# Patient Record
Sex: Male | Born: 1950 | Race: White | Hispanic: No | Marital: Married | Smoking: Never smoker
Health system: Southern US, Community
[De-identification: ages and names within clinical notes are randomized; demographics above are authoritative.]

## PROBLEM LIST (undated history)

## (undated) ENCOUNTER — Emergency Department (HOSPITAL_BASED_OUTPATIENT_CLINIC_OR_DEPARTMENT_OTHER): Payer: No Typology Code available for payment source | Source: Home / Self Care

## (undated) DIAGNOSIS — I10 Essential (primary) hypertension: Secondary | ICD-10-CM

## (undated) DIAGNOSIS — E119 Type 2 diabetes mellitus without complications: Secondary | ICD-10-CM

## (undated) DIAGNOSIS — E079 Disorder of thyroid, unspecified: Secondary | ICD-10-CM

## (undated) DIAGNOSIS — Z87828 Personal history of other (healed) physical injury and trauma: Secondary | ICD-10-CM

## (undated) DIAGNOSIS — M549 Dorsalgia, unspecified: Secondary | ICD-10-CM

## (undated) DIAGNOSIS — I2510 Atherosclerotic heart disease of native coronary artery without angina pectoris: Secondary | ICD-10-CM

## (undated) DIAGNOSIS — F32A Depression, unspecified: Secondary | ICD-10-CM

## (undated) DIAGNOSIS — Z992 Dependence on renal dialysis: Secondary | ICD-10-CM

## (undated) DIAGNOSIS — N289 Disorder of kidney and ureter, unspecified: Secondary | ICD-10-CM

## (undated) DIAGNOSIS — D649 Anemia, unspecified: Secondary | ICD-10-CM

## (undated) HISTORY — PX: ERCP: SHX60

---

## 2020-07-09 ENCOUNTER — Encounter (HOSPITAL_BASED_OUTPATIENT_CLINIC_OR_DEPARTMENT_OTHER): Payer: Self-pay

## 2020-07-09 ENCOUNTER — Emergency Department (HOSPITAL_BASED_OUTPATIENT_CLINIC_OR_DEPARTMENT_OTHER): Payer: Medicare Other

## 2020-07-09 ENCOUNTER — Emergency Department (HOSPITAL_BASED_OUTPATIENT_CLINIC_OR_DEPARTMENT_OTHER)
Admission: EM | Admit: 2020-07-09 | Discharge: 2020-07-09 | Disposition: A | Discharge status: Home / Self Care | Payer: Medicare Other | Attending: Emergency Medicine | Admitting: Emergency Medicine

## 2020-07-09 ENCOUNTER — Other Ambulatory Visit: Payer: Self-pay

## 2020-07-09 ENCOUNTER — Encounter: Payer: Self-pay | Admitting: Emergency Medicine

## 2020-07-09 DIAGNOSIS — Z992 Dependence on renal dialysis: Secondary | ICD-10-CM | POA: Insufficient documentation

## 2020-07-09 DIAGNOSIS — W01198A Fall on same level from slipping, tripping and stumbling with subsequent striking against other object, initial encounter: Secondary | ICD-10-CM | POA: Diagnosis not present

## 2020-07-09 DIAGNOSIS — M25551 Pain in right hip: Secondary | ICD-10-CM | POA: Insufficient documentation

## 2020-07-09 DIAGNOSIS — E119 Type 2 diabetes mellitus without complications: Secondary | ICD-10-CM | POA: Diagnosis not present

## 2020-07-09 DIAGNOSIS — I2510 Atherosclerotic heart disease of native coronary artery without angina pectoris: Secondary | ICD-10-CM | POA: Diagnosis not present

## 2020-07-09 DIAGNOSIS — M25561 Pain in right knee: Secondary | ICD-10-CM | POA: Insufficient documentation

## 2020-07-09 DIAGNOSIS — R0789 Other chest pain: Secondary | ICD-10-CM | POA: Diagnosis not present

## 2020-07-09 DIAGNOSIS — Y92002 Bathroom of unspecified non-institutional (private) residence single-family (private) house as the place of occurrence of the external cause: Secondary | ICD-10-CM | POA: Diagnosis not present

## 2020-07-09 DIAGNOSIS — S0012XA Contusion of left eyelid and periocular area, initial encounter: Secondary | ICD-10-CM | POA: Diagnosis not present

## 2020-07-09 DIAGNOSIS — M25562 Pain in left knee: Secondary | ICD-10-CM | POA: Diagnosis not present

## 2020-07-09 DIAGNOSIS — I10 Essential (primary) hypertension: Secondary | ICD-10-CM | POA: Diagnosis not present

## 2020-07-09 DIAGNOSIS — S0993XA Unspecified injury of face, initial encounter: Secondary | ICD-10-CM | POA: Diagnosis present

## 2020-07-09 DIAGNOSIS — W19XXXA Unspecified fall, initial encounter: Secondary | ICD-10-CM

## 2020-07-09 DIAGNOSIS — S0083XA Contusion of other part of head, initial encounter: Secondary | ICD-10-CM | POA: Diagnosis not present

## 2020-07-09 DIAGNOSIS — M549 Dorsalgia, unspecified: Secondary | ICD-10-CM | POA: Insufficient documentation

## 2020-07-09 HISTORY — DX: Disorder of kidney and ureter, unspecified: N28.9

## 2020-07-09 HISTORY — DX: Dorsalgia, unspecified: M54.9

## 2020-07-09 HISTORY — DX: Atherosclerotic heart disease of native coronary artery without angina pectoris: I25.10

## 2020-07-09 HISTORY — DX: Type 2 diabetes mellitus without complications: E11.9

## 2020-07-09 HISTORY — DX: Depression, unspecified: F32.A

## 2020-07-09 HISTORY — DX: Dependence on renal dialysis: Z99.2

## 2020-07-09 HISTORY — DX: Disorder of thyroid, unspecified: E07.9

## 2020-07-09 HISTORY — DX: Essential (primary) hypertension: I10

## 2020-07-09 NOTE — ED Triage Notes (Signed)
"  Was pulling up pants and fell in bathroom stall and hit my head on the door" per pt Denies loc

## 2020-07-09 NOTE — ED Notes (Signed)
Patient transported to X-ray 

## 2020-07-09 NOTE — ED Provider Notes (Signed)
[name]   CSN: [id number] Arrival date & time: 07/09/20  1528     History Chief Complaint  Patient presents with   Chase [name]    Chase [name] is a 70 y.o. male.  Mechanical fall at home.  Does peritoneal dialysis at home.  Not on blood thinners.  Hit the left side of his forehead on the door.  Landed on his front side.  Has pain to both knees, right hip, anterior chest wall.Was ambulatory after getting up with EMS.   Fall This is a new problem. The current episode started less than 1 hour ago. The problem has been resolved. Associated symptoms include chest pain. Pertinent negatives include no abdominal pain, no headaches and no shortness of breath. Nothing aggravates the symptoms. Nothing relieves the symptoms. He has tried nothing for the symptoms. The treatment provided no relief.      Past Medical History:  Diagnosis Date   Back pain with radiation    Coronary artery disease    Depression    Diabetes mellitus without complication ([place])    Dialysis patient [facility])    Hypertension    Renal disorder    Thyroid disease     Patient Active Problem List   Diagnosis Date Noted   Dialysis patient [facility]) 07/09/2020   Back pain 07/09/2020      History reviewed. No pertinent family history.  Social History   Tobacco Use   Smoking status: Never   Smokeless tobacco: Never  Substance Use Topics   Alcohol use: Never   Drug use: Never    Home Medications Prior to Admission medications   Not on File    Allergies    Gabapentin and Lipitor [atorvastatin]  Review of Systems   Review of Systems  Constitutional:  Negative for chills and fever.  HENT:  Negative for ear pain and sore throat.   Eyes:  Negative for pain and visual disturbance.  Respiratory:  Negative for cough and shortness of breath.   Cardiovascular:  Positive for chest pain. Negative for palpitations.  Gastrointestinal:  Negative for abdominal pain and  vomiting.  Genitourinary:  Negative for dysuria and hematuria.  Musculoskeletal:  Positive for arthralgias. Negative for back pain.  Skin:  Positive for wound. Negative for color change and rash.  Neurological:  Negative for seizures, syncope, weakness, numbness and headaches.  All other systems reviewed and are negative.  Physical Exam Updated Vital Signs BP (!) 148/66 (BP Location: Right Arm)   Pulse 78   Temp 98 F (36.7 C) (Oral)   Resp 20   Ht '5\' 6"'$  (1.676 m)   Wt 99.8 kg   SpO2 98%   BMI 35.51 kg/m   Physical Exam Vitals and nursing note reviewed.  Constitutional:      Appearance: He is well-developed.  HENT:     Head:     Comments: Hematoma to left side of the forehead/eyebrow    Mouth/Throat:     Mouth: Mucous membranes are moist.  Eyes:     Extraocular Movements: Extraocular movements intact.     Conjunctiva/sclera: Conjunctivae normal.     Pupils: Pupils are equal, round, and reactive to light.  Cardiovascular:     Rate and Rhythm: Normal rate and regular rhythm.     Heart sounds: No murmur heard. Pulmonary:     Effort: Pulmonary effort is normal. No respiratory distress.     Breath sounds: Normal breath sounds.  Abdominal:     Palpations:  Abdomen is soft.     Tenderness: There is no abdominal tenderness.  Musculoskeletal:        General: Tenderness present.     Cervical back: Normal range of motion and neck supple.     Comments: Tenderness to anterior chest wall, right hip, bilateral knees, no midline spinal tenderness  Skin:    General: Skin is warm and dry.     Capillary Refill: Capillary refill takes less than 2 seconds.  Neurological:     General: No focal deficit present.     Mental Status: He is alert and oriented to person, place, and time.     Cranial Nerves: No cranial nerve deficit.     Sensory: No sensory deficit.     Motor: No weakness.     Coordination: Coordination normal.  Psychiatric:        Mood and Affect: Mood normal.    ED  Results / Procedures / Treatments   Labs (all labs ordered are listed, but only abnormal results are displayed) Labs Reviewed - No data to display  EKG None  Radiology DG Chest 2 View  Result Date: 07/09/2020 CLINICAL DATA:  Anterior left-sided chest pain. Fall in the bathroom. EXAM: CHEST - 2 VIEW COMPARISON:  Chest radiograph and CT 06/04/2019 FINDINGS: The cardiomediastinal contours are normal. Minor linear scarring in the right lung. Pulmonary vasculature is normal. No consolidation, pleural effusion, or pneumothorax. Spinal stimulator in place with 2 separate lead tips. No acute osseous abnormalities are seen. Chronic mild L1 superior endplate compression fracture is stable. IMPRESSION: No evidence of acute injury to the thorax. Mild right lung scarring. Electronically Signed   By: Keith Rake M.D.   On: 07/09/2020 16:23   CT Head Wo Contrast  Result Date: 07/09/2020 CLINICAL DATA:  Pain, status post fall EXAM: CT HEAD WITHOUT CONTRAST CT MAXILLOFACIAL WITHOUT CONTRAST CT CERVICAL SPINE WITHOUT CONTRAST TECHNIQUE: Multidetector CT imaging of the head, cervical spine, and maxillofacial structures were performed using the standard protocol without intravenous contrast. Multiplanar CT image reconstructions of the cervical spine and maxillofacial structures were also generated. COMPARISON:  None. FINDINGS: CT HEAD FINDINGS Brain: Cerebral ventricle sizes are concordant with the degree of cerebral volume loss. Patchy and confluent areas of decreased attenuation are noted throughout the deep and periventricular white matter of the cerebral hemispheres bilaterally, compatible with chronic microvascular ischemic disease. No evidence of large-territorial acute infarction. No parenchymal hemorrhage. No mass lesion. No extra-axial collection. No mass effect or midline shift. No hydrocephalus. Basilar cisterns are patent. Vascular: No hyperdense vessel. Atherosclerotic calcifications are present within  the cavernous internal carotid arteries. Skull: No acute fracture or focal lesion. Other: Left frontal scalp/periorbital subcutaneus soft tissue hematoma formation. CT MAXILLOFACIAL FINDINGS Osseous: No fracture or mandibular dislocation. No destructive process. Sinuses/Orbits: Paranasal sinuses and mastoid air cells are clear. Bilateral lens replacements. Otherwise the orbits are unremarkable. Soft tissues: Negative. CT CERVICAL SPINE FINDINGS Alignment: Normal. Skull base and vertebrae: Severe degenerative changes of the spine. No acute fracture. No aggressive appearing focal osseous lesion or focal pathologic process. Soft tissues and spinal canal: No prevertebral fluid or swelling. No visible canal hematoma. Upper chest: Unremarkable. Other: None. IMPRESSION: 1. No acute intracranial abnormality. 2. No acute displaced facial fracture. 3. No acute displaced fracture or traumatic listhesis of the cervical spine. 4. Left periorbital/frontal scalp hematoma formation with no underlying fracture. Electronically Signed   By: Iven Finn M.D.   On: 07/09/2020 16:06   CT Cervical Spine Wo Contrast  Result Date: 07/09/2020 CLINICAL DATA:  Pain, status post fall EXAM: CT HEAD WITHOUT CONTRAST CT MAXILLOFACIAL WITHOUT CONTRAST CT CERVICAL SPINE WITHOUT CONTRAST TECHNIQUE: Multidetector CT imaging of the head, cervical spine, and maxillofacial structures were performed using the standard protocol without intravenous contrast. Multiplanar CT image reconstructions of the cervical spine and maxillofacial structures were also generated. COMPARISON:  None. FINDINGS: CT HEAD FINDINGS Brain: Cerebral ventricle sizes are concordant with the degree of cerebral volume loss. Patchy and confluent areas of decreased attenuation are noted throughout the deep and periventricular white matter of the cerebral hemispheres bilaterally, compatible with chronic microvascular ischemic disease. No evidence of large-territorial acute  infarction. No parenchymal hemorrhage. No mass lesion. No extra-axial collection. No mass effect or midline shift. No hydrocephalus. Basilar cisterns are patent. Vascular: No hyperdense vessel. Atherosclerotic calcifications are present within the cavernous internal carotid arteries. Skull: No acute fracture or focal lesion. Other: Left frontal scalp/periorbital subcutaneus soft tissue hematoma formation. CT MAXILLOFACIAL FINDINGS Osseous: No fracture or mandibular dislocation. No destructive process. Sinuses/Orbits: Paranasal sinuses and mastoid air cells are clear. Bilateral lens replacements. Otherwise the orbits are unremarkable. Soft tissues: Negative. CT CERVICAL SPINE FINDINGS Alignment: Normal. Skull base and vertebrae: Severe degenerative changes of the spine. No acute fracture. No aggressive appearing focal osseous lesion or focal pathologic process. Soft tissues and spinal canal: No prevertebral fluid or swelling. No visible canal hematoma. Upper chest: Unremarkable. Other: None. IMPRESSION: 1. No acute intracranial abnormality. 2. No acute displaced facial fracture. 3. No acute displaced fracture or traumatic listhesis of the cervical spine. 4. Left periorbital/frontal scalp hematoma formation with no underlying fracture. Electronically Signed   By: Iven Finn M.D.   On: 07/09/2020 16:06   DG Knee Complete 4 Views Left  Result Date: 07/09/2020 CLINICAL DATA:  Bilateral knee pain after fall today. EXAM: LEFT KNEE - COMPLETE 4+ VIEW COMPARISON:  None. FINDINGS: No evidence of fracture, dislocation, or joint effusion. Mild tricompartmental degenerative change with peripheral spurring. Mild soft tissue edema. Vascular calcifications are seen. IMPRESSION: 1. No acute fracture or subluxation of the left knee. 2. Mild tricompartmental osteoarthritis. Electronically Signed   By: Keith Rake M.D.   On: 07/09/2020 16:28   DG Knee Complete 4 Views Right  Result Date: 07/09/2020 CLINICAL DATA:   Bilateral knee pain after fall today. EXAM: RIGHT KNEE - COMPLETE 4+ VIEW COMPARISON:  None. FINDINGS: No evidence of fracture, dislocation, or joint effusion. Mild tricompartmental peripheral spurring. Probable 31m ossified intra-articular body in the medial joint space. Mild generalized soft tissue edema. IMPRESSION: 1. No acute fracture or subluxation of the right knee. 2. Mild tricompartmental osteoarthritis with probable ossified intra-articular body in the medial joint space. Electronically Signed   By: MKeith RakeM.D.   On: 07/09/2020 16:26   DG Hip Unilat With Pelvis 2-3 Views Right  Result Date: 07/09/2020 CLINICAL DATA:  Right hip pain after fall. EXAM: DG HIP (WITH OR WITHOUT PELVIS) 2-3V RIGHT COMPARISON:  Reformats from abdominopelvic CT 02/09/2020 FINDINGS: No acute fracture. Moderate right hip osteoarthritis with joint space narrowing, acetabular and femoral head spurring. Intact pubic rami. The sacroiliac joints and pubic symphysis are congruent. IMPRESSION: 1. No acute fracture of the pelvis or right hip. 2. Moderate right hip osteoarthritis. Electronically Signed   By: MKeith RakeM.D.   On: 07/09/2020 16:25   CT Maxillofacial Wo Contrast  Result Date: 07/09/2020 CLINICAL DATA:  Pain, status post fall EXAM: CT HEAD WITHOUT CONTRAST CT MAXILLOFACIAL WITHOUT CONTRAST CT CERVICAL SPINE  WITHOUT CONTRAST TECHNIQUE: Multidetector CT imaging of the head, cervical spine, and maxillofacial structures were performed using the standard protocol without intravenous contrast. Multiplanar CT image reconstructions of the cervical spine and maxillofacial structures were also generated. COMPARISON:  None. FINDINGS: CT HEAD FINDINGS Brain: Cerebral ventricle sizes are concordant with the degree of cerebral volume loss. Patchy and confluent areas of decreased attenuation are noted throughout the deep and periventricular white matter of the cerebral hemispheres bilaterally, compatible with chronic  microvascular ischemic disease. No evidence of large-territorial acute infarction. No parenchymal hemorrhage. No mass lesion. No extra-axial collection. No mass effect or midline shift. No hydrocephalus. Basilar cisterns are patent. Vascular: No hyperdense vessel. Atherosclerotic calcifications are present within the cavernous internal carotid arteries. Skull: No acute fracture or focal lesion. Other: Left frontal scalp/periorbital subcutaneus soft tissue hematoma formation. CT MAXILLOFACIAL FINDINGS Osseous: No fracture or mandibular dislocation. No destructive process. Sinuses/Orbits: Paranasal sinuses and mastoid air cells are clear. Bilateral lens replacements. Otherwise the orbits are unremarkable. Soft tissues: Negative. CT CERVICAL SPINE FINDINGS Alignment: Normal. Skull base and vertebrae: Severe degenerative changes of the spine. No acute fracture. No aggressive appearing focal osseous lesion or focal pathologic process. Soft tissues and spinal canal: No prevertebral fluid or swelling. No visible canal hematoma. Upper chest: Unremarkable. Other: None. IMPRESSION: 1. No acute intracranial abnormality. 2. No acute displaced facial fracture. 3. No acute displaced fracture or traumatic listhesis of the cervical spine. 4. Left periorbital/frontal scalp hematoma formation with no underlying fracture. Electronically Signed   By: Iven Finn M.D.   On: 07/09/2020 16:06    Procedures Procedures   Medications Ordered in ED Medications - No data to display  ED Course  I have reviewed the triage vital signs and the nursing notes.  Pertinent labs & imaging results that were available during my care of the patient were reviewed by me and considered in my medical decision making (see chart for details).    MDM Rules/Calculators/A&P                          Chase [name] is here with renal disease on home dialysis who presents the ED after mechanical fall at home.  Normal vitals.  No fever.  Patient  tripped and fell while pulling up his pants after going to the bathroom.  Hit the door and fell on the floor facing forward.  Not lose consciousness.  Not on blood thinners.  Has hematoma over the left side of his forehead, eyebrow.  Extraocular movements are intact.  Having some pain to his right hip, anterior chest wall, bilateral knees.  Uses a cane at times at home.  We will get CT of head, neck, face, x-rays of his hip, chest, bilateral knees.  CT images overall unremarkable except for hematoma over the left side of the forehead.  No fractures, no head bleed.  Extremity x-rays overall unremarkable.  No rib fractures.  No hip fracture.  Overall patient with contusions.  Discharged in good condition.  This chart was dictated using voice recognition software.  Despite best efforts to proofread,  errors can occur which can change the documentation meaning.   Final Clinical Impression(s) / ED Diagnoses Final diagnoses:  Fall, initial encounter  Contusion of left eyelid, initial encounter    Rx / DC Orders ED Discharge Orders     None        Lennice Sites, DO 07/09/20 1632

## 2020-07-09 NOTE — Discharge Instructions (Addendum)
X-ray showed no fractures.  CT scans overall unremarkable.  Recommend ice to your forehead.  Follow-up with primary care doctor if pain is persistent.  Overall suspect you have bone bruises.

## 2020-07-09 NOTE — ED Notes (Signed)
Patient transported to CT 

## 2020-10-11 ENCOUNTER — Emergency Department (HOSPITAL_BASED_OUTPATIENT_CLINIC_OR_DEPARTMENT_OTHER): Payer: Medicare Other

## 2020-10-11 ENCOUNTER — Encounter (HOSPITAL_BASED_OUTPATIENT_CLINIC_OR_DEPARTMENT_OTHER): Payer: Self-pay | Admitting: Emergency Medicine

## 2020-10-11 ENCOUNTER — Other Ambulatory Visit: Payer: Self-pay

## 2020-10-11 ENCOUNTER — Emergency Department (HOSPITAL_BASED_OUTPATIENT_CLINIC_OR_DEPARTMENT_OTHER)
Admission: EM | Admit: 2020-10-11 | Discharge: 2020-10-11 | Disposition: A | Discharge status: Home / Self Care | Payer: Medicare Other | Attending: Emergency Medicine | Admitting: Emergency Medicine

## 2020-10-11 DIAGNOSIS — W19XXXA Unspecified fall, initial encounter: Secondary | ICD-10-CM

## 2020-10-11 DIAGNOSIS — N186 End stage renal disease: Secondary | ICD-10-CM | POA: Insufficient documentation

## 2020-10-11 DIAGNOSIS — Z20822 Contact with and (suspected) exposure to covid-19: Secondary | ICD-10-CM | POA: Diagnosis not present

## 2020-10-11 DIAGNOSIS — I2510 Atherosclerotic heart disease of native coronary artery without angina pectoris: Secondary | ICD-10-CM | POA: Diagnosis not present

## 2020-10-11 DIAGNOSIS — S80211A Abrasion, right knee, initial encounter: Secondary | ICD-10-CM | POA: Insufficient documentation

## 2020-10-11 DIAGNOSIS — Z992 Dependence on renal dialysis: Secondary | ICD-10-CM | POA: Insufficient documentation

## 2020-10-11 DIAGNOSIS — S0990XA Unspecified injury of head, initial encounter: Secondary | ICD-10-CM | POA: Diagnosis present

## 2020-10-11 DIAGNOSIS — I120 Hypertensive chronic kidney disease with stage 5 chronic kidney disease or end stage renal disease: Secondary | ICD-10-CM | POA: Diagnosis not present

## 2020-10-11 DIAGNOSIS — S0031XA Abrasion of nose, initial encounter: Secondary | ICD-10-CM | POA: Diagnosis not present

## 2020-10-11 DIAGNOSIS — W1830XA Fall on same level, unspecified, initial encounter: Secondary | ICD-10-CM | POA: Diagnosis not present

## 2020-10-11 DIAGNOSIS — M542 Cervicalgia: Secondary | ICD-10-CM | POA: Diagnosis not present

## 2020-10-11 DIAGNOSIS — E1122 Type 2 diabetes mellitus with diabetic chronic kidney disease: Secondary | ICD-10-CM | POA: Insufficient documentation

## 2020-10-11 DIAGNOSIS — S00511A Abrasion of lip, initial encounter: Secondary | ICD-10-CM | POA: Insufficient documentation

## 2020-10-11 DIAGNOSIS — S0083XA Contusion of other part of head, initial encounter: Secondary | ICD-10-CM | POA: Diagnosis not present

## 2020-10-11 LAB — COMPREHENSIVE METABOLIC PANEL
ALT: 29 U/L (ref 0–44)
AST: 47 U/L — ABNORMAL HIGH (ref 15–41)
Albumin: 2.6 g/dL — ABNORMAL LOW (ref 3.5–5.0)
Alkaline Phosphatase: 91 U/L (ref 38–126)
Anion gap: 9 (ref 5–15)
BUN: 71 mg/dL — ABNORMAL HIGH (ref 8–23)
CO2: 26 mmol/L (ref 22–32)
Calcium: 8.1 mg/dL — ABNORMAL LOW (ref 8.9–10.3)
Chloride: 103 mmol/L (ref 98–111)
Creatinine, Ser: 8.19 mg/dL — ABNORMAL HIGH (ref 0.61–1.24)
GFR, Estimated: 7 mL/min — ABNORMAL LOW (ref >60)
Glucose, Bld: 115 mg/dL — ABNORMAL HIGH (ref 70–99)
Potassium: 4.1 mmol/L (ref 3.5–5.1)
Sodium: 138 mmol/L (ref 135–145)
Total Bilirubin: 0.4 mg/dL (ref 0.3–1.2)
Total Protein: 6.2 g/dL — ABNORMAL LOW (ref 6.5–8.1)

## 2020-10-11 LAB — CBC WITH DIFFERENTIAL/PLATELET
Abs Immature Granulocytes: 0.05 10*3/uL (ref 0.00–0.07)
Basophils Absolute: 0.1 10*3/uL (ref 0.0–0.1)
Basophils Relative: 1 %
Eosinophils Absolute: 0.4 10*3/uL (ref 0.0–0.5)
Eosinophils Relative: 4 %
HCT: 28.3 % — ABNORMAL LOW (ref 39.0–52.0)
Hemoglobin: 9.2 g/dL — ABNORMAL LOW (ref 13.0–17.0)
Immature Granulocytes: 1 %
Lymphocytes Relative: 10 %
Lymphs Abs: 0.9 10*3/uL (ref 0.7–4.0)
MCH: 33.5 pg (ref 26.0–34.0)
MCHC: 32.5 g/dL (ref 30.0–36.0)
MCV: 102.9 fL — ABNORMAL HIGH (ref 80.0–100.0)
Monocytes Absolute: 0.6 10*3/uL (ref 0.1–1.0)
Monocytes Relative: 6 %
Neutro Abs: 7.6 10*3/uL (ref 1.7–7.7)
Neutrophils Relative %: 78 %
Platelets: 112 10*3/uL — ABNORMAL LOW (ref 150–400)
RBC: 2.75 MIL/uL — ABNORMAL LOW (ref 4.22–5.81)
RDW: 15 % (ref 11.5–15.5)
Smear Review: ADEQUATE
WBC: 9.6 10*3/uL (ref 4.0–10.5)
nRBC: 0 % (ref 0.0–0.2)

## 2020-10-11 LAB — PROTIME-INR
INR: 1 (ref 0.8–1.2)
Prothrombin Time: 13.1 seconds (ref 11.4–15.2)

## 2020-10-11 LAB — RESP PANEL BY RT-PCR (FLU A&B, COVID) ARPGX2
Influenza A by PCR: NEGATIVE
Influenza B by PCR: NEGATIVE
SARS Coronavirus 2 by RT PCR: NEGATIVE

## 2020-10-11 MED ORDER — ACETAMINOPHEN 325 MG PO TABS
650.0000 mg | ORAL_TABLET | Freq: Once | ORAL | Status: AC
  Administered 2020-10-11: 650 mg via ORAL
  Filled 2020-10-11: qty 2

## 2020-10-11 MED ORDER — OXYCODONE HCL 5 MG PO TABS
5.0000 mg | ORAL_TABLET | Freq: Once | ORAL | Status: AC
  Administered 2020-10-11: 5 mg via ORAL
  Filled 2020-10-11: qty 1

## 2020-10-11 MED ORDER — FENTANYL CITRATE PF 50 MCG/ML IJ SOSY
50.0000 ug | PREFILLED_SYRINGE | Freq: Once | INTRAMUSCULAR | Status: AC
Start: 2020-10-11 — End: 2020-10-11
  Administered 2020-10-11: 50 ug via INTRAVENOUS
  Filled 2020-10-11: qty 1

## 2020-10-11 NOTE — ED Notes (Signed)
Patient with ground level fall on concrete brought in by EMS.  Patient with swelling noted to forehead, abrasion to forehead, nose and mouth, swelling to lip.  C/o pain to face, head, neck, mouth, and right knee.  Patient in cervical collar from EMS. Patient reports falling due to ongoing problem with weakness he is going to physical therapy.
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
## 2020-10-11 NOTE — ED Notes (Signed)
Patient transported to CT 

## 2020-10-11 NOTE — ED Triage Notes (Signed)
T BIB [facility] with report of mechanical fall. Hematoma noted to forehead. Small lac noted to bridge of note. Pt c/o neck pain. Endorses PT for weakness. Pt reports neck pain, brought in with c-collar placed by EMS. Pt denies HA, denies dizziness, denies thinners. EDP at bedside for MSE

## 2020-10-24 ENCOUNTER — Encounter (HOSPITAL_BASED_OUTPATIENT_CLINIC_OR_DEPARTMENT_OTHER): Payer: Self-pay

## 2020-10-24 ENCOUNTER — Other Ambulatory Visit: Payer: Self-pay

## 2020-10-24 ENCOUNTER — Emergency Department (HOSPITAL_BASED_OUTPATIENT_CLINIC_OR_DEPARTMENT_OTHER): Payer: Medicare Other

## 2020-10-24 ENCOUNTER — Observation Stay (HOSPITAL_BASED_OUTPATIENT_CLINIC_OR_DEPARTMENT_OTHER)
Admission: EM | Admit: 2020-10-24 | Discharge: 2020-10-26 | Disposition: A | Discharge status: Home / Self Care | Payer: Medicare Other | Attending: Family Medicine | Admitting: Family Medicine

## 2020-10-24 DIAGNOSIS — Y92009 Unspecified place in unspecified non-institutional (private) residence as the place of occurrence of the external cause: Secondary | ICD-10-CM | POA: Diagnosis not present

## 2020-10-24 DIAGNOSIS — I120 Hypertensive chronic kidney disease with stage 5 chronic kidney disease or end stage renal disease: Secondary | ICD-10-CM | POA: Insufficient documentation

## 2020-10-24 DIAGNOSIS — R531 Weakness: Secondary | ICD-10-CM

## 2020-10-24 DIAGNOSIS — Z9181 History of falling: Secondary | ICD-10-CM | POA: Insufficient documentation

## 2020-10-24 DIAGNOSIS — S065XAA Traumatic subdural hemorrhage with loss of consciousness status unknown, initial encounter: Secondary | ICD-10-CM | POA: Diagnosis present

## 2020-10-24 DIAGNOSIS — S065X0A Traumatic subdural hemorrhage without loss of consciousness, initial encounter: Principal | ICD-10-CM | POA: Insufficient documentation

## 2020-10-24 DIAGNOSIS — I2510 Atherosclerotic heart disease of native coronary artery without angina pectoris: Secondary | ICD-10-CM | POA: Diagnosis not present

## 2020-10-24 DIAGNOSIS — Z20822 Contact with and (suspected) exposure to covid-19: Secondary | ICD-10-CM | POA: Diagnosis not present

## 2020-10-24 DIAGNOSIS — G8929 Other chronic pain: Secondary | ICD-10-CM | POA: Diagnosis present

## 2020-10-24 DIAGNOSIS — W19XXXA Unspecified fall, initial encounter: Secondary | ICD-10-CM

## 2020-10-24 DIAGNOSIS — D72829 Elevated white blood cell count, unspecified: Secondary | ICD-10-CM | POA: Diagnosis present

## 2020-10-24 DIAGNOSIS — Z992 Dependence on renal dialysis: Secondary | ICD-10-CM | POA: Diagnosis not present

## 2020-10-24 DIAGNOSIS — E119 Type 2 diabetes mellitus without complications: Secondary | ICD-10-CM

## 2020-10-24 DIAGNOSIS — N186 End stage renal disease: Secondary | ICD-10-CM

## 2020-10-24 DIAGNOSIS — E1122 Type 2 diabetes mellitus with diabetic chronic kidney disease: Secondary | ICD-10-CM | POA: Insufficient documentation

## 2020-10-24 DIAGNOSIS — W01198A Fall on same level from slipping, tripping and stumbling with subsequent striking against other object, initial encounter: Secondary | ICD-10-CM | POA: Diagnosis not present

## 2020-10-24 DIAGNOSIS — R8290 Unspecified abnormal findings in urine: Secondary | ICD-10-CM | POA: Diagnosis present

## 2020-10-24 DIAGNOSIS — Z794 Long term (current) use of insulin: Secondary | ICD-10-CM

## 2020-10-24 DIAGNOSIS — I609 Nontraumatic subarachnoid hemorrhage, unspecified: Secondary | ICD-10-CM

## 2020-10-24 MED ORDER — SODIUM CHLORIDE 0.9 % IV BOLUS
500.0000 mL | Freq: Once | INTRAVENOUS | Status: AC
  Administered 2020-10-25: 500 mL via INTRAVENOUS

## 2020-10-24 MED ORDER — ACETAMINOPHEN 325 MG PO TABS
650.0000 mg | ORAL_TABLET | Freq: Once | ORAL | Status: AC
  Administered 2020-10-24: 650 mg via ORAL
  Filled 2020-10-24: qty 2

## 2020-10-24 NOTE — ED Provider Notes (Signed)
Emergency Department Provider Note   I have reviewed the triage vital signs and the nursing notes.   HISTORY  Chief Complaint Fall   HPI Chase [name] is a 70 y.o. male with past history reviewed below including diabetes, hypertension, dialysis presents to the emergency department with headache after fall.  He has had subacute balance issues and multiple falls including fall today.  He feels some generalized weakness but denies any unilateral weakness or numbness.  He again lost his balance today falling and striking his head.  Denies pain in the arms or legs.  Presents to the emergency department for evaluation. No radiation of symptoms or modifying factors. HA is diffuse and mild to moderate in severity.    Past Medical History:  Diagnosis Date   Back pain with radiation    Coronary artery disease    Depression    Diabetes mellitus without complication ([place])    Dialysis patient [facility])    Hypertension    Renal disorder    Thyroid disease     Patient Active Problem List   Diagnosis Date Noted   Acute subdural hematoma 10/25/2020   ESRD on peritoneal dialysis ([place]) 10/25/2020   Leukocytosis 10/25/2020   Fall at home, initial encounter 10/25/2020   Abnormal urinalysis 10/25/2020   Chronic pain 10/25/2020   Diabetes mellitus ([place]) 10/25/2020   Dialysis patient ([place]) 07/09/2020   Back pain 07/09/2020    History reviewed. No pertinent surgical history.  Allergies Gabapentin, Lipitor [atorvastatin], and Pregabalin  History reviewed. No pertinent family history.  Social History Social History   Tobacco Use   Smoking status: Never   Smokeless tobacco: Never  Substance Use Topics   Alcohol use: Never   Drug use: Never    Review of Systems  Constitutional: No fever/chills Eyes: No visual changes. ENT: No sore throat. Cardiovascular: Denies chest pain. Respiratory: Denies shortness of breath. Gastrointestinal: No abdominal pain.  No nausea, no vomiting.  No  diarrhea.  No constipation. Genitourinary: Negative for dysuria. Musculoskeletal: Negative for back pain. Skin: Negative for rash. Neurological: Negative for focal weakness or numbness. Positive HA.   10-point ROS otherwise negative.  ____________________________________________   PHYSICAL EXAM:  VITAL SIGNS: ED Triage Vitals  Enc Vitals Group     BP 10/24/20 2022 (!) 146/65     Pulse Rate 10/24/20 2022 68     Resp 10/24/20 2022 20     Temp 10/24/20 2022 98.5 F (36.9 C)     Temp Source 10/24/20 2022 Oral     SpO2 10/24/20 2017 98 %     Weight 10/24/20 2021 230 lb (104.3 kg)     Height 10/24/20 2021 '5\' 6"'$  (1.676 m)   Constitutional: Alert and oriented. Well appearing and in no acute distress. Eyes: Conjunctivae are normal. PERRL. EOMI. Head: Frontal scalp hematoma with ecchymosis. No laceration.  Nose: No congestion/rhinnorhea. Mouth/Throat: Mucous membranes are moist.  Neck: No stridor. No cervical spine tenderness to palpation. Cardiovascular: Normal rate, regular rhythm. Good peripheral circulation. Grossly normal heart sounds.   Respiratory: Normal respiratory effort.  No retractions. Lungs CTAB. Gastrointestinal: Soft and nontender. No distention.  Musculoskeletal: No lower extremity tenderness nor edema. No gross deformities of extremities. Neurologic:  Normal speech and language. No gross focal neurologic deficits are appreciated.  Skin:  Skin is warm, dry and intact. No rash noted.   ____________________________________________   LABS (all labs ordered are listed, but only abnormal results are displayed)  Labs Reviewed  COMPREHENSIVE METABOLIC PANEL -  Abnormal; Notable for the following components:      Result Value   Glucose, Bld 132 (*)    BUN 65 (*)    Creatinine, Ser 9.37 (*)    Calcium 7.4 (*)    Total Protein 5.8 (*)    Albumin 2.4 (*)    GFR, Estimated 6 (*)    All other components within normal limits  CBC WITH DIFFERENTIAL/PLATELET - Abnormal;  Notable for the following components:   WBC 11.4 (*)    RBC 2.86 (*)    Hemoglobin 9.5 (*)    HCT 29.7 (*)    MCV 103.8 (*)    RDW 16.2 (*)    Platelets 117 (*)    Neutro Abs 9.2 (*)    All other components within normal limits  URINALYSIS, ROUTINE W REFLEX MICROSCOPIC - Abnormal; Notable for the following components:   APPearance HAZY (*)    Hgb urine dipstick MODERATE (*)    Protein, ur 100 (*)    Leukocytes,Ua SMALL (*)    All other components within normal limits  URINALYSIS, MICROSCOPIC (REFLEX) - Abnormal; Notable for the following components:   Bacteria, UA RARE (*)    All other components within normal limits  TSH - Abnormal; Notable for the following components:   TSH 4.504 (*)    All other components within normal limits  GLUCOSE, CAPILLARY - Abnormal; Notable for the following components:   Glucose-Capillary 132 (*)    All other components within normal limits  CBC - Abnormal; Notable for the following components:   RBC 2.72 (*)    Hemoglobin 8.8 (*)    HCT 29.1 (*)    MCV 107.0 (*)    RDW 16.2 (*)    Platelets 115 (*)    All other components within normal limits  RENAL FUNCTION PANEL - Abnormal; Notable for the following components:   Glucose, Bld 182 (*)    BUN 65 (*)    Creatinine, Ser 9.56 (*)    Calcium 7.6 (*)    Phosphorus 5.3 (*)    Albumin 2.1 (*)    GFR, Estimated 5 (*)    All other components within normal limits  GLUCOSE, CAPILLARY - Abnormal; Notable for the following components:   Glucose-Capillary 135 (*)    All other components within normal limits  GLUCOSE, CAPILLARY - Abnormal; Notable for the following components:   Glucose-Capillary 201 (*)    All other components within normal limits  GLUCOSE, CAPILLARY - Abnormal; Notable for the following components:   Glucose-Capillary 110 (*)    All other components within normal limits  GLUCOSE, CAPILLARY - Abnormal; Notable for the following components:   Glucose-Capillary 116 (*)    All other  components within normal limits  RESP PANEL BY RT-PCR (FLU A&B, COVID) ARPGX2  URINE CULTURE  LIPASE, BLOOD  PROTIME-INR  HEMOGLOBIN A1C  FOLATE   ____________________________________________  RADIOLOGY  CT Head Wo Contrast  Addendum Date: 10/24/2020   ADDENDUM REPORT: 10/24/2020 21:52 ADDENDUM: These results were called by telephone at the time of interpretation on 10/24/2020 at 9:38 pm to provider Fredia Sorrow , who verbally acknowledged these results. Electronically Signed   By: Ronney Asters M.D.   On: 10/24/2020 21:52   Result Date: 10/24/2020 CLINICAL DATA:  Trauma. EXAM: CT HEAD WITHOUT CONTRAST TECHNIQUE: Contiguous axial images were obtained from the base of the skull through the vertex without intravenous contrast. COMPARISON:  CT head 10/11/2020. FINDINGS: Brain: Acute subdural hemorrhage is seen overlying the  falx bilaterally measuring up to 4 mm in thickness. There is also small amount of acute hemorrhage overlying the right tentorium measuring up 2 mm. There is a small amount of subarachnoid hemorrhage in the high right frontal region coronal image 4/54. There is no significant mass effect or midline shift. There is mild diffuse atrophy. There is no hydrocephalus. There is mild patchy periventricular and deep white matter hypodensity, likely chronic small vessel ischemic change. 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 Vascular: Atherosclerotic calcifications are present within the cavernous internal carotid arteries. Skull: Normal. Negative for fracture or focal lesion. Sinuses/Orbits: No acute finding. Other: Left frontal scalp hematomas and soft tissue swelling. IMPRESSION: 1. Small acute subdural hematomas overlying the falx bilaterally and right tentorium. No mass effect. 2. Small amount of acute subarachnoid hemorrhage in the right frontal region. 3. Left frontal scalp hematomas.  No underlying skull fracture. 4. Diffuse atrophy and chronic ischemic changes.  Electronically Signed: By: Ronney Asters M.D. On: 10/24/2020 21:29    ____________________________________________   PROCEDURES  Procedure(s) performed:   Procedures  CRITICAL CARE Performed by: Margette Fast Total critical care time: 35 minutes Critical care time was exclusive of separately billable procedures and treating other patients. Critical care was necessary to treat or prevent imminent or life-threatening deterioration. Critical care was time spent personally by me on the following activities: development of treatment plan with patient and/or surrogate as well as nursing, discussions with consultants, evaluation of patient's response to treatment, examination of patient, obtaining history from patient or surrogate, ordering and performing treatments and interventions, ordering and review of laboratory studies, ordering and review of radiographic studies, pulse oximetry and re-evaluation of patient's condition.  Nanda Quinton, MD Emergency Medicine  ____________________________________________   INITIAL IMPRESSION / ASSESSMENT AND PLAN / ED COURSE  Pertinent labs & imaging results that were available during my care of the patient were reviewed by me and considered in my medical decision making (see chart for details).   Patient presents emergency department with recurrent falls and balance issues.  He has mild headache but no focal neurologic deficits.  He has outward signs of head trauma.  CT imaging obtained showing small subdural and subarachnoid hemorrhage (traumatic). Patient is not anticoagulated.   Differential diagnosis includes but is not exclusive to subarachnoid hemorrhage, meningitis, encephalitis, previous head trauma, cavernous venous thrombosis, muscle tension headache, glaucoma, temporal arteritis, migraine or migraine equivalent, etc.   11:45 PM  Spoke with NSG regarding presentation and CT findings. Plan for repeat CT tomorrow AM. No acute NSG intervention.    Discussed patient's case with [facility] to request admission. Patient and family (if present) updated with plan. Care transferred to [facility] service.  I reviewed all nursing notes, vitals, pertinent old records, EKGs, labs, imaging (as available).  ____________________________________________  FINAL CLINICAL IMPRESSION(S) / ED DIAGNOSES  Final diagnoses:  Fall, initial encounter  Generalized weakness  SDH (subdural hematoma)  SAH (subarachnoid hemorrhage) ([place])     MEDICATIONS GIVEN DURING THIS VISIT:  Medications  acetaminophen (TYLENOL) tablet 650 mg (650 mg Oral Given 10/24/20 2026)  sodium chloride 0.9 % bolus 500 mL (0 mLs Intravenous Stopped 10/25/20 0214)     Note:  This document was prepared using Dragon voice recognition software and may include unintentional dictation errors.  Nanda Quinton, MD, [facility] Emergency Medicine    Chizaram Latino, Wonda Olds, MD 10/27/20 [phone]

## 2020-10-24 NOTE — ED Triage Notes (Signed)
Pt BIB EMS had mechanical fall, denies thinner use. Has had multiple falls recently. Old bruising to face. New hematoma to left temple and skin tears to arm, bleeding controlled.

## 2020-10-25 DIAGNOSIS — S065XAA Traumatic subdural hemorrhage with loss of consciousness status unknown, initial encounter: Secondary | ICD-10-CM

## 2020-10-25 DIAGNOSIS — Z794 Long term (current) use of insulin: Secondary | ICD-10-CM

## 2020-10-25 DIAGNOSIS — W19XXXA Unspecified fall, initial encounter: Secondary | ICD-10-CM

## 2020-10-25 DIAGNOSIS — I2510 Atherosclerotic heart disease of native coronary artery without angina pectoris: Secondary | ICD-10-CM | POA: Diagnosis not present

## 2020-10-25 DIAGNOSIS — D72829 Elevated white blood cell count, unspecified: Secondary | ICD-10-CM | POA: Diagnosis not present

## 2020-10-25 DIAGNOSIS — E1169 Type 2 diabetes mellitus with other specified complication: Secondary | ICD-10-CM

## 2020-10-25 DIAGNOSIS — N186 End stage renal disease: Secondary | ICD-10-CM

## 2020-10-25 DIAGNOSIS — W01198A Fall on same level from slipping, tripping and stumbling with subsequent striking against other object, initial encounter: Secondary | ICD-10-CM | POA: Diagnosis not present

## 2020-10-25 DIAGNOSIS — R8290 Unspecified abnormal findings in urine: Secondary | ICD-10-CM | POA: Diagnosis not present

## 2020-10-25 DIAGNOSIS — Z9181 History of falling: Secondary | ICD-10-CM | POA: Diagnosis not present

## 2020-10-25 DIAGNOSIS — Y92009 Unspecified place in unspecified non-institutional (private) residence as the place of occurrence of the external cause: Secondary | ICD-10-CM | POA: Diagnosis not present

## 2020-10-25 DIAGNOSIS — I120 Hypertensive chronic kidney disease with stage 5 chronic kidney disease or end stage renal disease: Secondary | ICD-10-CM | POA: Diagnosis not present

## 2020-10-25 DIAGNOSIS — Z20822 Contact with and (suspected) exposure to covid-19: Secondary | ICD-10-CM | POA: Diagnosis not present

## 2020-10-25 DIAGNOSIS — E1122 Type 2 diabetes mellitus with diabetic chronic kidney disease: Secondary | ICD-10-CM | POA: Diagnosis not present

## 2020-10-25 DIAGNOSIS — Z992 Dependence on renal dialysis: Secondary | ICD-10-CM

## 2020-10-25 DIAGNOSIS — S065X0A Traumatic subdural hemorrhage without loss of consciousness, initial encounter: Secondary | ICD-10-CM | POA: Diagnosis present

## 2020-10-25 DIAGNOSIS — G8929 Other chronic pain: Secondary | ICD-10-CM | POA: Diagnosis not present

## 2020-10-25 DIAGNOSIS — E119 Type 2 diabetes mellitus without complications: Secondary | ICD-10-CM

## 2020-10-25 LAB — GLUCOSE, CAPILLARY
Glucose-Capillary: 132 mg/dL — ABNORMAL HIGH (ref 70–99)
Glucose-Capillary: 135 mg/dL — ABNORMAL HIGH (ref 70–99)
Glucose-Capillary: 201 mg/dL — ABNORMAL HIGH (ref 70–99)

## 2020-10-25 LAB — LIPASE, BLOOD: Lipase: 42 U/L (ref 11–51)

## 2020-10-25 LAB — URINALYSIS, ROUTINE W REFLEX MICROSCOPIC
Bilirubin Urine: NEGATIVE
Glucose, UA: NEGATIVE mg/dL
Ketones, ur: NEGATIVE mg/dL
Nitrite: NEGATIVE
Protein, ur: 100 mg/dL — AB
Specific Gravity, Urine: 1.02 (ref 1.005–1.030)
pH: 6 (ref 5.0–8.0)

## 2020-10-25 LAB — COMPREHENSIVE METABOLIC PANEL
ALT: 24 U/L (ref 0–44)
AST: 32 U/L (ref 15–41)
Albumin: 2.4 g/dL — ABNORMAL LOW (ref 3.5–5.0)
Alkaline Phosphatase: 104 U/L (ref 38–126)
Anion gap: 11 (ref 5–15)
BUN: 65 mg/dL — ABNORMAL HIGH (ref 8–23)
CO2: 25 mmol/L (ref 22–32)
Calcium: 7.4 mg/dL — ABNORMAL LOW (ref 8.9–10.3)
Chloride: 100 mmol/L (ref 98–111)
Creatinine, Ser: 9.37 mg/dL — ABNORMAL HIGH (ref 0.61–1.24)
GFR, Estimated: 6 mL/min — ABNORMAL LOW (ref >60)
Glucose, Bld: 132 mg/dL — ABNORMAL HIGH (ref 70–99)
Potassium: 3.7 mmol/L (ref 3.5–5.1)
Sodium: 136 mmol/L (ref 135–145)
Total Bilirubin: 0.4 mg/dL (ref 0.3–1.2)
Total Protein: 5.8 g/dL — ABNORMAL LOW (ref 6.5–8.1)

## 2020-10-25 LAB — URINALYSIS, MICROSCOPIC (REFLEX): WBC, UA: >50 WBC/hpf (ref 0–5)

## 2020-10-25 LAB — PROTIME-INR
INR: 1 (ref 0.8–1.2)
Prothrombin Time: 12.7 seconds (ref 11.4–15.2)

## 2020-10-25 LAB — CBC WITH DIFFERENTIAL/PLATELET
Abs Immature Granulocytes: 0.06 10*3/uL (ref 0.00–0.07)
Basophils Absolute: 0 10*3/uL (ref 0.0–0.1)
Basophils Relative: 0 %
Eosinophils Absolute: 0.5 10*3/uL (ref 0.0–0.5)
Eosinophils Relative: 4 %
HCT: 29.7 % — ABNORMAL LOW (ref 39.0–52.0)
Hemoglobin: 9.5 g/dL — ABNORMAL LOW (ref 13.0–17.0)
Immature Granulocytes: 1 %
Lymphocytes Relative: 10 %
Lymphs Abs: 1.1 10*3/uL (ref 0.7–4.0)
MCH: 33.2 pg (ref 26.0–34.0)
MCHC: 32 g/dL (ref 30.0–36.0)
MCV: 103.8 fL — ABNORMAL HIGH (ref 80.0–100.0)
Monocytes Absolute: 0.6 10*3/uL (ref 0.1–1.0)
Monocytes Relative: 5 %
Neutro Abs: 9.2 10*3/uL — ABNORMAL HIGH (ref 1.7–7.7)
Neutrophils Relative %: 80 %
Platelets: 117 10*3/uL — ABNORMAL LOW (ref 150–400)
RBC: 2.86 MIL/uL — ABNORMAL LOW (ref 4.22–5.81)
RDW: 16.2 % — ABNORMAL HIGH (ref 11.5–15.5)
Smear Review: NORMAL
WBC: 11.4 10*3/uL — ABNORMAL HIGH (ref 4.0–10.5)
nRBC: 0 % (ref 0.0–0.2)

## 2020-10-25 LAB — RESP PANEL BY RT-PCR (FLU A&B, COVID) ARPGX2
Influenza A by PCR: NEGATIVE
Influenza B by PCR: NEGATIVE
SARS Coronavirus 2 by RT PCR: NEGATIVE

## 2020-10-25 LAB — HEMOGLOBIN A1C
Hgb A1c MFr Bld: 5.5 % (ref 4.8–5.6)
Mean Plasma Glucose: 111 mg/dL

## 2020-10-25 LAB — TSH: TSH: 4.504 u[IU]/mL — ABNORMAL HIGH (ref 0.350–4.500)

## 2020-10-25 MED ORDER — BUPRENORPHINE HCL 2 MG SL SUBL
2.0000 mg | SUBLINGUAL_TABLET | Freq: Every day | SUBLINGUAL | Status: DC
Start: 2020-10-25 — End: 2020-10-27
  Administered 2020-10-25 – 2020-10-26 (×2): 2 mg via SUBLINGUAL
  Filled 2020-10-25 (×2): qty 1

## 2020-10-25 MED ORDER — DELFLEX-LC/2.5% DEXTROSE 394 MOSM/L IP SOLN: INTRAPERITONEAL | Status: DC

## 2020-10-25 MED ORDER — B-12 3000 MCG PO CAPS: 3000.0000 ug | ORAL_CAPSULE | Freq: Every day | ORAL | Status: DC

## 2020-10-25 MED ORDER — INSULIN ASPART 100 UNIT/ML IJ SOLN: 0.0000 [IU] | Freq: Three times a day (TID) | INTRAMUSCULAR | Status: DC

## 2020-10-25 MED ORDER — HEPARIN 1000 UNIT/ML FOR PERITONEAL DIALYSIS
INTRAPERITONEAL | Status: DC | PRN
  Filled 2020-10-25: qty 5000

## 2020-10-25 MED ORDER — BUSPIRONE HCL 5 MG PO TABS: 10.0000 mg | ORAL_TABLET | ORAL | Status: DC

## 2020-10-25 MED ORDER — ACETAMINOPHEN 325 MG PO TABS
650.0000 mg | ORAL_TABLET | Freq: Four times a day (QID) | ORAL | Status: DC | PRN
  Administered 2020-10-25: 650 mg via ORAL
  Filled 2020-10-25: qty 2

## 2020-10-25 MED ORDER — BUSPIRONE HCL 5 MG PO TABS
10.0000 mg | ORAL_TABLET | Freq: Every evening | ORAL | Status: DC
  Administered 2020-10-25: 10 mg via ORAL
  Filled 2020-10-25 (×2): qty 2

## 2020-10-25 MED ORDER — ONDANSETRON HCL 4 MG PO TABS: 4.0000 mg | ORAL_TABLET | Freq: Four times a day (QID) | ORAL | Status: DC | PRN

## 2020-10-25 MED ORDER — URSODIOL 300 MG PO CAPS
300.0000 mg | ORAL_CAPSULE | Freq: Two times a day (BID) | ORAL | Status: DC
  Administered 2020-10-25 – 2020-10-26 (×3): 300 mg via ORAL
  Filled 2020-10-25 (×4): qty 1

## 2020-10-25 MED ORDER — PROBIOTIC PO TBEC: DELAYED_RELEASE_TABLET | Freq: Every day | ORAL | Status: DC

## 2020-10-25 MED ORDER — BUSPIRONE HCL 5 MG PO TABS
20.0000 mg | ORAL_TABLET | Freq: Every day | ORAL | Status: DC
  Administered 2020-10-25 – 2020-10-26 (×2): 20 mg via ORAL
  Filled 2020-10-25 (×2): qty 4

## 2020-10-25 MED ORDER — ROSUVASTATIN CALCIUM 5 MG PO TABS
10.0000 mg | ORAL_TABLET | Freq: Every day | ORAL | Status: DC
  Administered 2020-10-25: 10 mg via ORAL
  Filled 2020-10-25: qty 2

## 2020-10-25 MED ORDER — FUROSEMIDE 20 MG PO TABS
20.0000 mg | ORAL_TABLET | Freq: Two times a day (BID) | ORAL | Status: DC
  Administered 2020-10-25 – 2020-10-26 (×2): 20 mg via ORAL
  Filled 2020-10-25 (×2): qty 1

## 2020-10-25 MED ORDER — ESCITALOPRAM OXALATE 10 MG PO TABS
20.0000 mg | ORAL_TABLET | Freq: Every day | ORAL | Status: DC
  Administered 2020-10-25 – 2020-10-26 (×2): 20 mg via ORAL
  Filled 2020-10-25 (×2): qty 2

## 2020-10-25 MED ORDER — CALCITRIOL 0.25 MCG PO CAPS
0.2500 ug | ORAL_CAPSULE | Freq: Every day | ORAL | Status: DC
  Administered 2020-10-25 – 2020-10-26 (×2): 0.25 ug via ORAL
  Filled 2020-10-25 (×2): qty 1

## 2020-10-25 MED ORDER — VITAMIN B-12 1000 MCG PO TABS
3000.0000 ug | ORAL_TABLET | Freq: Every day | ORAL | Status: DC
  Administered 2020-10-25 – 2020-10-26 (×2): 3000 ug via ORAL
  Filled 2020-10-25 (×2): qty 3

## 2020-10-25 MED ORDER — MELATONIN 5 MG PO TABS
5.0000 mg | ORAL_TABLET | Freq: Every day | ORAL | Status: DC
  Administered 2020-10-25: 5 mg via ORAL
  Filled 2020-10-25: qty 1

## 2020-10-25 MED ORDER — ONDANSETRON HCL 4 MG/2ML IJ SOLN: 4.0000 mg | Freq: Four times a day (QID) | INTRAMUSCULAR | Status: DC | PRN

## 2020-10-25 MED ORDER — LEVOTHYROXINE SODIUM 75 MCG PO TABS
175.0000 ug | ORAL_TABLET | Freq: Every day | ORAL | Status: DC
  Administered 2020-10-26: 175 ug via ORAL
  Filled 2020-10-25: qty 1

## 2020-10-25 MED ORDER — GENTAMICIN SULFATE 0.1 % EX CREA
1.0000 "application " | TOPICAL_CREAM | Freq: Every day | CUTANEOUS | Status: DC
  Administered 2020-10-25 – 2020-10-26 (×2): 1 via TOPICAL
  Filled 2020-10-25 (×2): qty 15

## 2020-10-25 MED ORDER — AMITRIPTYLINE HCL 10 MG PO TABS
20.0000 mg | ORAL_TABLET | Freq: Every day | ORAL | Status: DC
  Administered 2020-10-25: 20 mg via ORAL
  Filled 2020-10-25 (×2): qty 2

## 2020-10-25 MED ORDER — CARVEDILOL 25 MG PO TABS
25.0000 mg | ORAL_TABLET | Freq: Two times a day (BID) | ORAL | Status: DC
  Administered 2020-10-25 – 2020-10-26 (×2): 25 mg via ORAL
  Filled 2020-10-25 (×2): qty 1

## 2020-10-25 MED ORDER — PANTOPRAZOLE SODIUM 40 MG PO TBEC
40.0000 mg | DELAYED_RELEASE_TABLET | Freq: Two times a day (BID) | ORAL | Status: DC
  Administered 2020-10-25 – 2020-10-26 (×3): 40 mg via ORAL
  Filled 2020-10-25 (×3): qty 1

## 2020-10-25 MED ORDER — SODIUM CHLORIDE 0.9% FLUSH
3.0000 mL | Freq: Two times a day (BID) | INTRAVENOUS | Status: DC
  Administered 2020-10-25 – 2020-10-26 (×3): 3 mL via INTRAVENOUS

## 2020-10-25 MED ORDER — GLUCERNA SHAKE PO LIQD
237.0000 mL | Freq: Two times a day (BID) | ORAL | Status: DC
  Administered 2020-10-25 – 2020-10-26 (×4): 237 mL via ORAL

## 2020-10-25 MED ORDER — RISAQUAD PO CAPS
1.0000 | ORAL_CAPSULE | Freq: Every day | ORAL | Status: DC
  Administered 2020-10-25 – 2020-10-26 (×2): 1 via ORAL
  Filled 2020-10-25 (×2): qty 1

## 2020-10-25 MED ORDER — ACETAMINOPHEN 650 MG RE SUPP: 650.0000 mg | Freq: Four times a day (QID) | RECTAL | Status: DC | PRN

## 2020-10-25 MED ORDER — HEPARIN 1000 UNIT/ML FOR PERITONEAL DIALYSIS: 500.0000 [IU] | INTRAMUSCULAR | Status: DC | PRN

## 2020-10-25 MED ORDER — CALCIUM CARBONATE ANTACID 500 MG PO CHEW
2.0000 | CHEWABLE_TABLET | Freq: Three times a day (TID) | ORAL | Status: DC
  Administered 2020-10-25 – 2020-10-26 (×4): 400 mg via ORAL
  Filled 2020-10-25 (×4): qty 2

## 2020-10-25 MED ORDER — ALBUTEROL SULFATE (2.5 MG/3ML) 0.083% IN NEBU
2.5000 mg | INHALATION_SOLUTION | Freq: Four times a day (QID) | RESPIRATORY_TRACT | Status: DC | PRN
Start: 2020-10-25 — End: 2020-10-27

## 2020-10-25 NOTE — Progress Notes (Signed)
Pt admitted as a new admit from [facility] ED. Pt A&O x4, MAE x4, pt oriented to the unit and room, fall safety precaution and prevention education completed. Skin assessment completed per protocol, telemetry applied and verified with CCMD, skin tears and abrasion on bilateral arms and leg. Peritoneal dialysis catheter intact to abdomen, skin cleansed and foam dsg applied to abrasions and skin tears. No pressure ulcers noted except for ecchymosis and abrasions from the fall. MD on call paged and notified of pt arrival to the unit. Will continue to closely monitor pt. Delia Heady RN     10/25/20 0457  Vitals  Temp 99.3 F (37.4 C)  Temp Source Oral  BP 129/60  MAP (mmHg) 81  BP Location Right Arm  BP Method Automatic  Patient Position (if appropriate) Lying  Pulse Rate Source Monitor  Level of Consciousness  Level of Consciousness Alert  Oxygen Therapy  SpO2 93 %  O2 Device Room Air

## 2020-10-25 NOTE — ED Notes (Signed)
Report given to [facility]. 

## 2020-10-25 NOTE — ED Notes (Signed)
Pt unable to void at this time. 

## 2020-10-25 NOTE — Evaluation (Signed)
Physical Therapy Evaluation Patient Details Name: Chase [name] MRN: [id number] DOB: August 30, 1950 Today's Date: 10/25/2020  History of Present Illness  70 y.o. male presents to [facility] hospital on 10/25/2020 s/p fall. CT head demonstrates tiny SDH along the falx and tentorium. Of note pt presented [facility]6 due to a fall also. Pt reports he gets dizzy, passes out, and falls. PMH includes back pain, depression, CAD, DM, CKD, HTN.  Clinical Impression  Pt presets to PT with deficits in balance, sensation, gait, vision. Pt with a history of peripheral neuropathy as well as inferior visual field deficits. Pt reports most recent falls occurring due to loss of balance and denies dizziness or passing out. Pt demonstrates the ability to mobilize with UE support of a walker for household distances without loss of balance. PT provides education on the need to sit for any activities that would further obstruct vision, ie upper body dressing. Pt will benefit form continued outpatient PT at the time of discharge to aide in improving balance and reducing falls risk.       Recommendations for follow up therapy are one component of a multi-disciplinary discharge planning process, led by the attending physician.  Recommendations may be updated based on patient status, additional functional criteria and insurance authorization.  Follow Up Recommendations Outpatient PT    Equipment Recommendations  None recommended by PT (pt owns necessary DME)    Recommendations for Other Services       Precautions / Restrictions Precautions Precautions: Fall Restrictions Weight Bearing Restrictions: No      Mobility  Bed Mobility Overal bed mobility: Independent                  Transfers Overall transfer level: Needs assistance Equipment used: Rolling walker (2 wheeled) Transfers: Sit to/from Stand Sit to Stand: Supervision            Ambulation/Gait Ambulation/Gait assistance:  Supervision Gait Distance (Feet): 350 Feet Assistive device: Rolling walker (2 wheeled) Gait Pattern/deviations: Step-through pattern Gait velocity: reduced Gait velocity interpretation: 1.31 - 2.62 ft/sec, indicative of limited community ambulator General Gait Details: pt with steady step-through gait, able to perform head turns without loss of balance. Pt is able to minimally increase/decrease gait speed. Pt walks backwards, dragging feet initially prior to PT cues.  Stairs            Wheelchair Mobility    Modified Rankin (Stroke Patients Only)       Balance Overall balance assessment: Needs assistance Sitting-balance support: No upper extremity supported;Feet supported Sitting balance-Leahy Scale: Good     Standing balance support: No upper extremity supported (eyes closed with minG) Standing balance-Leahy Scale: Fair                               Pertinent Vitals/Pain Pain Assessment: 0-10 Pain Score: 5  Pain Location: R knee Pain Descriptors / Indicators: Aching Pain Intervention(s): Monitored during session    Home Living Family/patient expects to be discharged to:: Private residence Living Arrangements: Spouse/significant other Available Help at Discharge: Family;Available PRN/intermittently (spouse works 3 days 4 hours) Type of Home: Apartment Home Access: Level entry     Home Layout: One level Home Equipment: Walker - 2 wheels;Walker - 4 wheels;Cane - single point      Prior Function Level of Independence: Independent with assistive device(s)         Comments: pt reports ambulating with use of cane, RW,  or rollator at baseline. Pt has stopped utilizing cane recently due to falls.     Hand Dominance        Extremity/Trunk Assessment   Upper Extremity Assessment Upper Extremity Assessment: Overall WFL for tasks assessed    Lower Extremity Assessment Lower Extremity Assessment: RLE deficits/detail;LLE deficits/detail RLE  Sensation: decreased light touch;history of peripheral neuropathy LLE Sensation: decreased light touch;history of peripheral neuropathy    Cervical / Trunk Assessment Cervical / Trunk Assessment: Normal  Communication   Communication: No difficulties  Cognition Arousal/Alertness: Awake/alert Behavior During Therapy: WFL for tasks assessed/performed Overall Cognitive Status: Within Functional Limits for tasks assessed                                        General Comments General comments (skin integrity, edema, etc.): orthostatic vitals: supine 138/62, sitting 141/64, standing 124/58, standing 3 min 129/77. Pt asymptomatic    Exercises     Assessment/Plan    PT Assessment Patient needs continued PT services  PT Problem List Decreased balance;Decreased mobility;Impaired sensation       PT Treatment Interventions DME instruction;Gait training;Functional mobility training;Therapeutic activities;Therapeutic exercise;Balance training;Neuromuscular re-education;Patient/family education    PT Goals (Current goals can be found in the Care Plan section)  Acute Rehab PT Goals Patient Stated Goal: to reduce falls risk PT Goal Formulation: With patient Time For Goal Achievement: 11/08/20 Potential to Achieve Goals: Good Additional Goals Additional Goal #1: Pt will score >19/24 on DGI to indicate reduced risk for falls    Frequency Min 3X/week   Barriers to discharge        Co-evaluation               AM-PAC PT "6 Clicks" Mobility  Outcome Measure Help needed turning from your back to your side while in a flat bed without using bedrails?: None Help needed moving from lying on your back to sitting on the side of a flat bed without using bedrails?: None Help needed moving to and from a bed to a chair (including a wheelchair)?: A Little Help needed standing up from a chair using your arms (e.g., wheelchair or bedside chair)?: A Little Help needed to walk in  hospital room?: A Little Help needed climbing 3-5 steps with a railing? : A Little 6 Click Score: 20    End of Session   Activity Tolerance: Patient tolerated treatment well Patient left: in chair;with call bell/phone within reach;with chair alarm set Nurse Communication: Mobility status PT Visit Diagnosis: History of falling (Z91.81);Other abnormalities of gait and mobility (R26.89)    Time: 1325-1403 PT Time Calculation (min) (ACUTE ONLY): 38 min   Charges:   PT Evaluation $PT Eval Low Complexity: 1 Low          Zenaida Niece, PT, DPT Acute Rehabilitation Pager: [phone] Office [phone]'  Zenaida Niece 10/25/2020, 3:21 PM

## 2020-10-25 NOTE — H&P (Signed)
History and Physical    Chase [name] [id number] DOB: [birth year] DOA: 10/24/2020  Referring MD/NP/PA: Mitzi Hansen, MD PCP: Patrecia Pour, Christean Grief, MD  Patient coming from: Transfer from [place]  Chief Complaint: Fall  I have personally briefly reviewed patient's old medical records in [place]   HPI: Chase [name] is a 70 y.o. male with medical history significant of HTN, CAD, s/p failed kidney transplant, ESRD on PD, DM type II, DVT, GIST tumor, chronic pain, and choledocholithiasis s/p cholecystectomy who presents after having a fall at home.  History is obtained from the patient with assistance of his wife is present at bedside.  Patient has had several falls here recently.  The initial fall occurred July 4 while he was in the bathroom while trying to bend over to pull up his pants hitting his head on the ground.  He had been using a cane after that fall, but he tripped over the curb October 6.  With that fall patient fractured his nose and sustained a large frontal hematoma, but there were no signs of any skull fracture.  He had been using a walker to try and help prevent himself from falling.  His right these interventions couple days ago while he was at home patient had another fall, but he did not recall what happened.  Yesterday while he was putting on his shirt he lost his balance falling hitting the left side of his head.  The patient wife makes note that he had been on started on gabapentin to help with pain, but this made him hallucinate and was discontinued.  Patient reports that he been doing peritoneal dialysis at night.  He last dialyzed on 10/18.  He does admit that he is swelling in his lower extremities more than usual, but does not feel short of breath.  ED Course: On admission to the emergency department patient was seen to be afebrile with relatively stable vital signs.  CT of the head without contrast significant for small acute subdural hematoma  overlying the falx bilaterally and right tentorium without mass-effect, small amount of acute subarachnoid hemorrhage in the right frontal region, and left frontal scalp hematomas without skull fracture.  Labs significant for WBC 11.4, hemoglobin 9.2, platelets 117, potassium 3.7, BUN 65, creatinine 9.37, calcium 7.4, and albumin 2.4.  Neurosurgery had been formally consulted, but felt no acute surgical intervention warranted.  Patient had been given Tylenol and 500 mL of normal saline IV fluids.  TRH called to admit.  Review of Systems  Constitutional:  Negative for chills and fever.  HENT:  Negative for nosebleeds.   Eyes:  Negative for photophobia and pain.  Cardiovascular:  Positive for leg swelling. Negative for chest pain.  Gastrointestinal:  Negative for abdominal pain, nausea and vomiting.  Genitourinary:  Negative for dysuria.  Musculoskeletal:  Positive for falls.  Neurological:  Positive for sensory change.  Endo/Heme/Allergies:  Bruises/bleeds easily.  Psychiatric/Behavioral:  Negative for memory loss.    Past Medical History:  Diagnosis Date   Back pain with radiation    Coronary artery disease    Depression    Diabetes mellitus without complication ([place])    Dialysis patient [facility])    Hypertension    Renal disorder    Thyroid disease     History reviewed. No pertinent surgical history.   reports that he has never smoked. He has never used smokeless tobacco. He reports that he does not drink alcohol and does not use drugs.  Allergies  Allergen Reactions   Gabapentin     Cognitive impairment per spouse   Lipitor [Atorvastatin]     Severe muscle cramps per spouse    History reviewed. No pertinent family history.  Prior to Admission medications   Medication Sig Start Date End Date Taking? Authorizing Provider  amitriptyline (ELAVIL) 10 MG tablet Take 20 mg by mouth at bedtime.   Yes [provider]  buprenorphine (SUBUTEX) 2 MG SUBL SL tablet Place under  the tongue daily.   Yes [provider]  busPIRone (BUSPAR) 10 MG tablet Take 10 mg by mouth 3 (three) times daily.   Yes [provider]  calcitRIOL (ROCALTROL) 0.25 MCG capsule Take 0.25 mcg by mouth daily.   Yes [provider]  carvedilol (COREG) 25 MG tablet Take 25 mg by mouth 2 (two) times daily with a meal.   Yes [provider]  escitalopram (LEXAPRO) 20 MG tablet Take 20 mg by mouth daily.   Yes [provider]  furosemide (LASIX) 20 MG tablet Take 20 mg by mouth 2 (two) times daily.   Yes [provider]  imatinib (GLEEVEC) 400 MG tablet Take 400 mg by mouth daily. Take with meals and large glass of water.Caution:Chemotherapy.   Yes [provider]  insulin glargine (LANTUS) 100 UNIT/ML injection Inject 8 Units into the skin daily.   Yes [provider]  levothyroxine (SYNTHROID) 175 MCG tablet Take 175 mcg by mouth daily before breakfast.   Yes [provider]  omeprazole (PRILOSEC) 40 MG capsule Take 40 mg by mouth daily.   Yes [provider]  ondansetron (ZOFRAN) 4 MG tablet Take 4 mg by mouth every 8 (eight) hours as needed for nausea or vomiting.   Yes [provider]  rosuvastatin (CRESTOR) 10 MG tablet Take 10 mg by mouth daily.   Yes [provider]  ursodiol (ACTIGALL) 300 MG capsule Take 300 mg by mouth 2 (two) times daily.   Yes [provider]  cephALEXin (KEFLEX) 500 MG capsule Take 500 mg by mouth.    [provider]    Physical Exam:  Constitutional: Elderly male who appears to be in some Vitals:   10/25/20 0056 10/25/20 0400 10/25/20 0457 10/25/20 0822  BP: 140/63 97/63 129/60 (!) 122/55  Pulse: 69 68  68  Resp: '17 14  16  '$ Temp:   99.3 F (37.4 C) 98.5 F (36.9 C)  TempSrc:   Oral Oral  SpO2: 95% 92% 93% 95%  Weight:      Height:       Eyes: PERRL, lids and conjunctivae normal.  Bruising underneath both eyes ENMT: Mucous membranes  are moist. Posterior pharynx clear of any exudate or lesions Neck: normal, supple, no masses, no thyromegaly Respiratory: clear to auscultation bilaterally, no wheezing, no crackles. Normal respiratory effort. No accessory muscle use.  Cardiovascular: Regular rate and rhythm, no murmurs / rubs / gallops.  +1 pitting bilateral lower extremity edema. 2+ pedal pulses. No carotid bruits.  Abdomen: no tenderness, no masses palpated. No hepatosplenomegaly. Bowel sounds positive.  Musculoskeletal: no clubbing / cyanosis. No joint deformity upper and lower extremities. Good ROM, no contractures. Normal muscle tone.  Skin: Large hematoma of the forehead with pressure bruising noted of the left temple Neurologic: CN 2-12 grossly intact. Strength 5/5 in all 4.  Psychiatric: Normal judgment and insight. Alert and oriented x 3. Normal mood.     Labs on Admission: I have personally reviewed following labs  and imaging studies  CBC: Recent Labs  Lab 10/25/20 0019  WBC 11.4*  NEUTROABS 9.2*  HGB 9.5*  HCT 29.7*  MCV 103.8*  PLT 123XX123*   Basic Metabolic Panel: Recent Labs  Lab 10/25/20 0019  NA 136  K 3.7  CL 100  CO2 25  GLUCOSE 132*  BUN 65*  CREATININE 9.37*  CALCIUM 7.4*   GFR: Estimated Creatinine Clearance: 8.4 mL/min (A) (by C-G formula based on SCr of 9.37 mg/dL (H)). Liver Function Tests: Recent Labs  Lab 10/25/20 0019  AST 32  ALT 24  ALKPHOS 104  BILITOT 0.4  PROT 5.8*  ALBUMIN 2.4*   Recent Labs  Lab 10/25/20 0019  LIPASE 42   No results for input(s): AMMONIA in the last 168 hours. Coagulation Profile: Recent Labs  Lab 10/25/20 0019  INR 1.0   Cardiac Enzymes: No results for input(s): CKTOTAL, CKMB, CKMBINDEX, TROPONINI in the last 168 hours. BNP (last 3 results) No results for input(s): PROBNP in the last 8760 hours. HbA1C: No results for input(s): HGBA1C in the last 72 hours. CBG: No results for input(s): GLUCAP in the last 168 hours. Lipid  Profile: No results for input(s): CHOL, HDL, LDLCALC, TRIG, CHOLHDL, LDLDIRECT in the last 72 hours. Thyroid Function Tests: No results for input(s): TSH, T4TOTAL, FREET4, T3FREE, THYROIDAB in the last 72 hours. Anemia Panel: No results for input(s): VITAMINB12, FOLATE, FERRITIN, TIBC, IRON, RETICCTPCT in the last 72 hours. Urine analysis:    Component Value Date/Time   COLORURINE YELLOW 10/25/2020 0311   APPEARANCEUR HAZY (A) 10/25/2020 0311   LABSPEC 1.020 10/25/2020 0311   PHURINE 6.0 10/25/2020 0311   GLUCOSEU NEGATIVE 10/25/2020 0311   HGBUR MODERATE (A) 10/25/2020 0311   BILIRUBINUR NEGATIVE 10/25/2020 0311   KETONESUR NEGATIVE 10/25/2020 0311   PROTEINUR 100 (A) 10/25/2020 0311   NITRITE NEGATIVE 10/25/2020 0311   LEUKOCYTESUR SMALL (A) 10/25/2020 0311   Sepsis Labs: Recent Results (from the past 240 hour(s))  Resp Panel by RT-PCR (Flu A&B, Covid) Nasopharyngeal Swab     Status: None   Collection Time: 10/25/20 12:55 AM   Specimen: Nasopharyngeal Swab; Nasopharyngeal(NP) swabs in vial transport medium  Result Value Ref Range Status   SARS Coronavirus 2 by RT PCR NEGATIVE NEGATIVE Final    Comment: (NOTE) SARS-CoV-2 target nucleic acids are NOT DETECTED.  The SARS-CoV-2 RNA is generally detectable in upper respiratory specimens during the acute phase of infection. The lowest concentration of SARS-CoV-2 viral copies this assay can detect is 138 copies/mL. A negative result does not preclude SARS-Cov-2 infection and should not be used as the sole basis for treatment or other patient management decisions. A negative result may occur with  improper specimen collection/handling, submission of specimen other than nasopharyngeal swab, presence of viral mutation(s) within the areas targeted by this assay, and inadequate number of viral copies(<138 copies/mL). A negative result must be combined with clinical observations, patient history, and epidemiological information. The  expected result is Negative.  Fact Sheet for Patients:  EntrepreneurPulse.com.au  Fact Sheet for Healthcare Providers:  IncredibleEmployment.be  This test is no t yet approved or cleared by the [place] FDA and  has been authorized for detection and/or diagnosis of SARS-CoV-2 by FDA under an Emergency Use Authorization (EUA). This EUA will remain  in effect (meaning this test can be used) for the duration of the COVID-19 declaration under Section 564(b)(1) of the Act, 21 U.S.C.section 360bbb-3(b)(1), unless the authorization is terminated  or revoked sooner.  Influenza A by PCR NEGATIVE NEGATIVE Final   Influenza B by PCR NEGATIVE NEGATIVE Final    Comment: (NOTE) The Xpert Xpress SARS-CoV-2/FLU/RSV plus assay is intended as an aid in the diagnosis of influenza from Nasopharyngeal swab specimens and should not be used as a sole basis for treatment. Nasal washings and aspirates are unacceptable for Xpert Xpress SARS-CoV-2/FLU/RSV testing.  Fact Sheet for Patients: EntrepreneurPulse.com.au  Fact Sheet for Healthcare Providers: IncredibleEmployment.be  This test is not yet approved or cleared by the [place] FDA and has been authorized for detection and/or diagnosis of SARS-CoV-2 by FDA under an Emergency Use Authorization (EUA). This EUA will remain in effect (meaning this test can be used) for the duration of the COVID-19 declaration under Section 564(b)(1) of the Act, 21 U.S.C. section 360bbb-3(b)(1), unless the authorization is terminated or revoked.  Performed at [facility], [address] [place], [place] [address]      Radiological Exams on Admission: CT Head Wo Contrast  Addendum Date: 10/24/2020   ADDENDUM REPORT: 10/24/2020 21:52 ADDENDUM: These results were called by telephone at the time of interpretation on 10/24/2020 at 9:38 pm to provider Fredia Sorrow ,  who verbally acknowledged these results. Electronically Signed   By: Ronney Asters M.D.   On: 10/24/2020 21:52   Result Date: 10/24/2020 CLINICAL DATA:  Trauma. EXAM: CT HEAD WITHOUT CONTRAST TECHNIQUE: Contiguous axial images were obtained from the base of the skull through the vertex without intravenous contrast. COMPARISON:  CT head 10/11/2020. FINDINGS: Brain: Acute subdural hemorrhage is seen overlying the falx bilaterally measuring up to 4 mm in thickness. There is also small amount of acute hemorrhage overlying the right tentorium measuring up 2 mm. There is a small amount of subarachnoid hemorrhage in the high right frontal region coronal image 4/54. There is no significant mass effect or midline shift. There is mild diffuse atrophy. There is no hydrocephalus. There is mild patchy periventricular and deep white matter hypodensity, likely chronic small vessel ischemic change. 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 Vascular: Atherosclerotic calcifications are present within the cavernous internal carotid arteries. Skull: Normal. Negative for fracture or focal lesion. Sinuses/Orbits: No acute finding. Other: Left frontal scalp hematomas and soft tissue swelling. IMPRESSION: 1. Small acute subdural hematomas overlying the falx bilaterally and right tentorium. No mass effect. 2. Small amount of acute subarachnoid hemorrhage in the right frontal region. 3. Left frontal scalp hematomas.  No underlying skull fracture. 4. Diffuse atrophy and chronic ischemic changes. Electronically Signed: By: Ronney Asters M.D. On: 10/24/2020 21:29    EKG: Independently reviewed.  Sinus rhythm at 70 bpm with PVC  Assessment/Plan Subdural hematoma and and subarachnoid secondary to fall: Patient reports having frequent falls since July 4.  Relates symptoms possibly to neuropathy.  Patient has since been using a rolling walker, but yesterday had been trying to put on her shirt when seemingly lost balance and had  another fall.  Imaging studies significant for small subdural hematomas and subarachnoid hemorrhage.  Neurosurgery evaluated and recommended monitoring overnight. -Admitted to a telemetry bed -Neurochecks -Check orthostatic vital signs -PT to evaluate and treat -Follow-up telemetry overnight -May warrant repeat CT imaging if neuro status changes.  Leukocytosis: Acute.  WBC elevated 11.4.  Could be reactive in nature or due to infection.  Patient denies any acute infectious symptoms. -Recheck CBC tomorrow morning  ESRD on peritoneal dialysis: Patient dialyzes every night at 10 PM for 9 hours.  He last  dialyzed on the night of 10/18. -Nephrology consulted for need of peritoneal dialysis  Abnormal urinalysis: Acute.  Urinalysis was noted to have small leukocytes with rare bacteria, and greater than 50 WBCs. -Check urine culture  Chronic pain: Home medication regimen buprenorphine 2 mg daily. -Continue buprenorphine  Hypothyroidism -Check TSH -Continue levothyroxine  Diabetes mellitus type 2: On admission glucose 132.  Home insulin regimen includes Lantus 8 units nightly. -Hypoglycemic protocols -CBGs before every meal with very sensitive SSI -Adjust insulin regimen as needed Anxiety and depression -Continue amitriptyline, BuSpar, and Lexapro  Hyperlipidemia -Continue Crestor  Macrocytic anemia: Chronic.  Hemoglobin 9.5 g/dL with MCV elevated at 103.8 appears similar to previous.  Patient is already on vitamin B12 supplementation. -Check folate levels in a.m. -Continue vitamin B-12 supplementation  Thrombocytopenia: Chronic.  Platelet count 117 which appears near baseline. -Continue to monitor  Hypoalbuminemia: Acute on chronic.  Albumin noted to be 2.4, but prealbumin noted to be within normal limits at 28 approximately 2 weeks ago. -Glucerna shakes in between meals  Obesity: BMI 37.12 kg/m  GERD -Continue pharmacy substitution of Protonix  DVT prophylaxis: SCDs Code  Status: Full Family Communication: wife updated at bedside Disposition Plan: To be determined Consults called: Nephrology, Admission status: Observation require less than 24-hour hospital stay  Norval Morton MD [facility]   If 7PM-7AM, please contact night-coverage   10/25/2020, 8:59 AM

## 2020-10-25 NOTE — Progress Notes (Signed)
Per pt placement, day shift MD will see pt when he/she comes in. Pt informed and agreeable. Delia Heady RN

## 2020-10-25 NOTE — ED Notes (Signed)
ED TO INPATIENT HANDOFF REPORT  ED Nurse Name and Phone #:   S Name/Age/Gender Chase [name] 70 y.o. male Room/Bed: MH04/MH04  Code Status   Code Status: Not on file  Home/SNF/Other Home Patient oriented to: self, place, time, and situation Is this baseline? Yes   Triage Complete: Triage complete  Chief Complaint Acute subdural hematoma [S06.5XAA]  Triage Note Pt BIB EMS had mechanical fall, denies thinner use. Has had multiple falls recently. Old bruising to face. New hematoma to left temple and skin tears to arm, bleeding controlled.    Allergies Allergies  Allergen Reactions   Gabapentin     Cognitive impairment per spouse   Lipitor [Atorvastatin]     Severe muscle cramps per spouse    Level of Care/Admitting Diagnosis ED Disposition     ED Disposition  Admit   Condition  --   [place]: [place] [id number]  Level of Care: Telemetry Medical [id number]  Interfacility transfer: Yes  May place patient in observation at [facility] or [facility] if equivalent level of care is available:: No  Covid Evaluation: Asymptomatic Screening Protocol (No Symptoms)  Diagnosis: Acute subdural hematoma [id number]  Admitting Physician: Vianne Bulls [id number]  Attending Physician: Vianne Bulls [id number]          B Medical/Surgery History Past Medical History:  Diagnosis Date   Back pain with radiation    Coronary artery disease    Depression    Diabetes mellitus without complication ([place])    Dialysis patient ([place])    Hypertension    Renal disorder    Thyroid disease    History reviewed. No pertinent surgical history.   A IV Location/Drains/Wounds Patient Lines/Drains/Airways Status     Active Line/Drains/Airways     Name Placement date Placement time Site Days   Peripheral IV 10/25/20 20 G 1" Anterior;Distal;Right;Upper Arm 10/25/20  0010  Arm  less than 1            Intake/Output Last 24 hours  Intake/Output  Summary (Last 24 hours) at 10/25/2020 0316 Last data filed at 10/25/2020 0214 Gross per 24 hour  Intake 500 ml  Output --  Net 500 ml    Labs/Imaging Results for orders placed or performed during the hospital encounter of 10/24/20 (from the past 48 hour(s))  Comprehensive metabolic panel     Status: Abnormal   Collection Time: 10/25/20 12:19 AM  Result Value Ref Range   Sodium 136 135 - 145 mmol/L   Potassium 3.7 3.5 - 5.1 mmol/L   Chloride 100 98 - 111 mmol/L   CO2 25 22 - 32 mmol/L   Glucose, Bld 132 (H) 70 - 99 mg/dL    Comment: Glucose reference range applies only to samples taken after fasting for at least 8 hours.   BUN 65 (H) 8 - 23 mg/dL   Creatinine, Ser 9.37 (H) 0.61 - 1.24 mg/dL   Calcium 7.4 (L) 8.9 - 10.3 mg/dL   Total Protein 5.8 (L) 6.5 - 8.1 g/dL   Albumin 2.4 (L) 3.5 - 5.0 g/dL   AST 32 15 - 41 U/L   ALT 24 0 - 44 U/L   Alkaline Phosphatase 104 38 - 126 U/L   Total Bilirubin 0.4 0.3 - 1.2 mg/dL   GFR, Estimated 6 (L) >60 mL/min    Comment: (NOTE) Calculated using the CKD-EPI Creatinine Equation (2021)    Anion gap 11 5 - 15    Comment: Performed at [facility], [address] [place], [place] [address]  Lipase, blood     Status: None   Collection Time: 10/25/20 12:19 AM  Result Value Ref Range   Lipase 42 11 - 51 U/L    Comment: Performed at [facility], [address] [place], [place] [address]  CBC with Differential     Status: Abnormal   Collection Time: 10/25/20 12:19 AM  Result Value Ref Range   WBC 11.4 (H) 4.0 - 10.5 K/uL   RBC 2.86 (L) 4.22 - 5.81 MIL/uL   Hemoglobin 9.5 (L) 13.0 - 17.0 g/dL   HCT 29.7 (L) 39.0 - 52.0 %   MCV 103.8 (H) 80.0 - 100.0 fL   MCH 33.2 26.0 - 34.0 pg   MCHC 32.0 30.0 - 36.0 g/dL   RDW 16.2 (H) 11.5 - 15.5 %   Platelets 117 (L) 150 - 400 K/uL    Comment: Immature Platelet Fraction may be clinically indicated, consider ordering this additional test [id number] REPEATED TO VERIFY PLATELET  COUNT CONFIRMED BY SMEAR    nRBC 0.0 0.0 - 0.2 %   Neutrophils Relative % 80 %   Neutro Abs 9.2 (H) 1.7 - 7.7 K/uL   Lymphocytes Relative 10 %   Lymphs Abs 1.1 0.7 - 4.0 K/uL   Monocytes Relative 5 %   Monocytes Absolute 0.6 0.1 - 1.0 K/uL   Eosinophils Relative 4 %   Eosinophils Absolute 0.5 0.0 - 0.5 K/uL   Basophils Relative 0 %   Basophils Absolute 0.0 0.0 - 0.1 K/uL   WBC Morphology MORPHOLOGY UNREMARKABLE    Smear Review Normal platelet morphology    Immature Granulocytes 1 %   Abs Immature Granulocytes 0.06 0.00 - 0.07 K/uL   Tear Drop Cells PRESENT    Stomatocytes PRESENT     Comment: Performed at [facility], [address] [place], [place] [address]  Protime-INR     Status: None   Collection Time: 10/25/20 12:19 AM  Result Value Ref Range   Prothrombin Time 12.7 11.4 - 15.2 seconds   INR 1.0 0.8 - 1.2    Comment: (NOTE) INR goal varies based on device and disease states. Performed at [facility], [address] [place], [place] [address]   Resp Panel by RT-PCR (Flu A&B, Covid) Nasopharyngeal Swab     Status: None   Collection Time: 10/25/20 12:55 AM   Specimen: Nasopharyngeal Swab; Nasopharyngeal(NP) swabs in vial transport medium  Result Value Ref Range   SARS Coronavirus 2 by RT PCR NEGATIVE NEGATIVE    Comment: (NOTE) SARS-CoV-2 target nucleic acids are NOT DETECTED.  The SARS-CoV-2 RNA is generally detectable in upper respiratory specimens during the acute phase of infection. The lowest concentration of SARS-CoV-2 viral copies this assay can detect is 138 copies/mL. A negative result does not preclude SARS-Cov-2 infection and should not be used as the sole basis for treatment or other patient management decisions. A negative result may occur with  improper specimen collection/handling, submission of specimen other than nasopharyngeal swab, presence of viral mutation(s) within the areas targeted by this assay, and inadequate number  of viral copies(<138 copies/mL). A negative result must be combined with clinical observations, patient history, and epidemiological information. The expected result is Negative.  Fact Sheet for Patients:  EntrepreneurPulse.com.au  Fact Sheet for Healthcare Providers:  IncredibleEmployment.be  This test is no t yet approved or cleared by the [place] and  has been authorized for detection and/or diagnosis of SARS-CoV-2 by FDA under an Emergency Use Authorization (EUA). This EUA will remain  in effect (meaning this test can be used) for the duration of the COVID-19 declaration under Section 564(b)(1) of the Act, 21 U.S.C.section 360bbb-3(b)(1), unless the authorization is terminated  or revoked sooner.       Influenza A by PCR NEGATIVE NEGATIVE   Influenza B by PCR NEGATIVE NEGATIVE    Comment: (NOTE) The Xpert Xpress SARS-CoV-2/FLU/RSV plus assay is intended as an aid in the diagnosis of influenza from Nasopharyngeal swab specimens and should not be used as a sole basis for treatment. Nasal washings and aspirates are unacceptable for Xpert Xpress SARS-CoV-2/FLU/RSV testing.  Fact Sheet for Patients: EntrepreneurPulse.com.au  Fact Sheet for Healthcare Providers: IncredibleEmployment.be  This test is not yet approved or cleared by the [place] FDA and has been authorized for detection and/or diagnosis of SARS-CoV-2 by FDA under an Emergency Use Authorization (EUA). This EUA will remain in effect (meaning this test can be used) for the duration of the COVID-19 declaration under Section 564(b)(1) of the Act, 21 U.S.C. section 360bbb-3(b)(1), unless the authorization is terminated or revoked.  Performed at [facility], [address] [place], [place] [address]    CT Head Wo Contrast  Addendum Date: 10/24/2020   ADDENDUM REPORT: 10/24/2020 21:52 ADDENDUM: These results were  called by telephone at the time of interpretation on 10/24/2020 at 9:38 pm to provider Fredia Sorrow , who verbally acknowledged these results. Electronically Signed   By: Ronney Asters M.D.   On: 10/24/2020 21:52   Result Date: 10/24/2020 CLINICAL DATA:  Trauma. EXAM: CT HEAD WITHOUT CONTRAST TECHNIQUE: Contiguous axial images were obtained from the base of the skull through the vertex without intravenous contrast. COMPARISON:  CT head 10/11/2020. FINDINGS: Brain: Acute subdural hemorrhage is seen overlying the falx bilaterally measuring up to 4 mm in thickness. There is also small amount of acute hemorrhage overlying the right tentorium measuring up 2 mm. There is a small amount of subarachnoid hemorrhage in the high right frontal region coronal image 4/54. There is no significant mass effect or midline shift. There is mild diffuse atrophy. There is no hydrocephalus. There is mild patchy periventricular and deep white matter hypodensity, likely chronic small vessel ischemic change. 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 Vascular: Atherosclerotic calcifications are present within the cavernous internal carotid arteries. Skull: Normal. Negative for fracture or focal lesion. Sinuses/Orbits: No acute finding. Other: Left frontal scalp hematomas and soft tissue swelling. IMPRESSION: 1. Small acute subdural hematomas overlying the falx bilaterally and right tentorium. No mass effect. 2. Small amount of acute subarachnoid hemorrhage in the right frontal region. 3. Left frontal scalp hematomas.  No underlying skull fracture. 4. Diffuse atrophy and chronic ischemic changes. Electronically Signed: By: Ronney Asters M.D. On: 10/24/2020 21:29    Pending Labs Unresulted Labs (From admission, onward)     Start     Ordered   10/24/20 2303  Urinalysis, Routine w reflex microscopic Urine, Clean Catch  ONCE - STAT,   STAT        10/24/20 2302            Vitals/Pain Today's Vitals   10/24/20 2315  10/24/20 2330 10/24/20 2335 10/25/20 0056  BP: (!) 145/66 (!) 146/61  140/63  Pulse: 70 71  69  Resp:  15  17  Temp:      TempSrc:  SpO2: 96% 96%  95%  Weight:      Height:      PainSc:   5      Isolation Precautions No active isolations  Medications Medications  acetaminophen (TYLENOL) tablet 650 mg (650 mg Oral Given 10/24/20 2026)  sodium chloride 0.9 % bolus 500 mL (0 mLs Intravenous Stopped 10/25/20 0214)    Mobility walks with person assist High fall risk   Focused Assessments    R Recommendations: See Admitting Provider Note  Report given to:   Additional Notes:

## 2020-10-25 NOTE — Consult Note (Signed)
Reason for Consult:SDH Referring Physician: EDP  Chase Chase [name] is an 70 y.o. Chase [name].   HPI:  70 year old Chase [name] presented to the hospital last night after sustaining a fall. He was here last week too for a reported fall. He states that he gets dizzy and then passes out. Has a history of ESRD and DM. Denies any headaches right now. Denies any nausea vomiting or vision changes.   Past Medical History:  Diagnosis Date   Back pain with radiation    Coronary artery disease    Depression    Diabetes mellitus without complication ([place])    Dialysis patient [facility])    Hypertension    Renal disorder    Thyroid disease     History reviewed. No pertinent surgical history.  Allergies  Allergen Reactions   Gabapentin     Cognitive impairment per spouse   Lipitor [Atorvastatin]     Severe muscle cramps per spouse    Social History   Tobacco Use   Smoking status: Never   Smokeless tobacco: Never  Substance Use Topics   Alcohol use: Never    History reviewed. No pertinent family history.   Review of Systems  Positive ROS: as above  All other systems have been reviewed and were otherwise negative with the exception of those mentioned in the HPI and as above.  Objective: Vital signs in last 24 hours: Temp:  [98.5 F (36.9 C)-99.3 F (37.4 C)] 99.3 F (37.4 C) (10/20 0457) Pulse Rate:  [67-71] 68 (10/20 0400) Resp:  [14-20] 14 (10/20 0400) BP: (96-146)/(54-75) 129/60 (10/20 0457) SpO2:  [92 %-99 %] 93 % (10/20 0457) Weight:  [104.3 kg] 104.3 kg (10/19 2021)  General Appearance: Alert, cooperative, no distress, appears stated age Head: Normocephalic, without obvious abnormality, atraumatic Eyes: PERRL, conjunctiva/corneas clear, EOM's intact, fundi benign, both eyes      Lungs: respirations unlabored Heart: Regular rate and rhythm,  Extremities: Extremities normal, atraumatic, no cyanosis or edema Pulses: 2+ and symmetric all extremities Skin: Skin color, texture, turgor normal, no  rashes or lesions  NEUROLOGIC:   Mental status: A&O x4, no aphasia, good attention span, Memory and fund of knowledge Motor Exam - grossly normal, normal tone and bulk Sensory Exam - grossly normal Reflexes: symmetric, no pathologic reflexes, No Hoffman's, No clonus Coordination - grossly normal Gait - not tested Balance - not tested Cranial Nerves: I: smell Not tested  II: visual acuity  OS: na  OD: na  II: visual fields Full to confrontation  II: pupils Equal, round, reactive to light  III,VII: ptosis None  III,IV,VI: extraocular muscles  Full ROM  V: mastication Normal  V: facial light touch sensation  Normal  V,VII: corneal reflex  Present  VII: facial muscle function - upper  Normal  VII: facial muscle function - lower Normal  VIII: hearing Not tested  IX: soft palate elevation  Normal  IX,X: gag reflex Present  XI: trapezius strength  5/5  XI: sternocleidomastoid strength 5/5  XI: neck flexion strength  5/5  XII: tongue strength  Normal    Data Review Lab Results  Component Value Date   WBC 11.4 (H) 10/25/2020   HGB 9.5 (L) 10/25/2020   HCT 29.7 (L) 10/25/2020   MCV 103.8 (H) 10/25/2020   PLT 117 (L) 10/25/2020   Lab Results  Component Value Date   NA 136 10/25/2020   K 3.7 10/25/2020   CL 100 10/25/2020   CO2 25 10/25/2020   BUN 65 (H)  10/25/2020   CREATININE 9.37 (H) 10/25/2020   GLUCOSE 132 (H) 10/25/2020   Lab Results  Component Value Date   INR 1.0 10/25/2020    Radiology: CT Head Wo Contrast  Addendum Date: 10/24/2020   ADDENDUM REPORT: 10/24/2020 21:52 ADDENDUM: These results were called by telephone at the time of interpretation on 10/24/2020 at 9:38 pm to provider Chase Chase [name] , who verbally acknowledged these results. Electronically Signed   By: Chase Chase [name] M.D.   On: 10/24/2020 21:52   Result Date: 10/24/2020 CLINICAL DATA:  Trauma. EXAM: CT HEAD WITHOUT CONTRAST TECHNIQUE: Contiguous axial images were obtained from the base of the  skull through the vertex without intravenous contrast. COMPARISON:  CT head 10/11/2020. FINDINGS: Brain: Acute subdural hemorrhage is seen overlying the falx bilaterally measuring up to 4 mm in thickness. There is also small amount of acute hemorrhage overlying the right tentorium measuring up 2 mm. There is a small amount of subarachnoid hemorrhage in the high right frontal region coronal image 4/54. There is no significant mass effect or midline shift. There is mild diffuse atrophy. There is no hydrocephalus. There is mild patchy periventricular and deep white matter hypodensity, likely chronic small vessel ischemic change. 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 Vascular: Atherosclerotic calcifications are present within the cavernous internal carotid arteries. Skull: Normal. Negative for fracture or focal lesion. Sinuses/Orbits: No acute finding. Other: Left frontal scalp hematomas and soft tissue swelling. IMPRESSION: 1. Small acute subdural hematomas overlying the falx bilaterally and right tentorium. No mass effect. 2. Small amount of acute subarachnoid hemorrhage in the right frontal region. 3. Left frontal scalp hematomas.  No underlying skull fracture. 4. Diffuse atrophy and chronic ischemic changes. Electronically Signed: By: Chase Chase [name] M.D. On: 10/24/2020 21:29     Assessment/Plan: 70 year old Chase [name] presented to [facility] last night after sustaining a fall from a standing position. CT head showed a tiny SDH along the falx and tentorium. No midline shift or mass effect. Would not rescan unless he has some neurologic change. There is no neurosurgical intervention warranted at this time. He does need to be worked up by medicine for these syncopal episodes that he has been having. Please call with any changes.    Chase Chase [name] 10/25/2020 7:45 AM

## 2020-10-25 NOTE — Consult Note (Signed)
[facility] Renal Consultation Note    Indication for Consultation:  Management of ESRD/hemodialysis; anemia, hypertension/volume and secondary hyperparathyroidism  HPI: Chase [name] is a 70 y.o. male with a [place] significant for HTN, DM type 2, CAD, and ESRD on CCPD who has been having falls over the past week and presented to [place] after fall yesterday and had significant bruising on his face and scalp.  CT scan in ED revealed a small acute subdural hematoma and small amount of falx bilaterally, no mass effect and small amount of acute subarachnoid hemorrhage in right frontal region.  He was admitted for observation by Neurosurgery and we were consulted to provide peritoneal dialysis during his stay.  I contacted his home PD unit for his prescription as he could not remember everything.  The nursing staff states that every since he was started on gabapentin, he has been falling at home.    Past Medical History:  Diagnosis Date   Back pain with radiation    Coronary artery disease    Depression    Diabetes mellitus without complication ([place])    Dialysis patient [facility])    Hypertension    Renal disorder    Thyroid disease    History reviewed. No pertinent surgical history. Family History:   History reviewed. No pertinent family history. Social History:  reports that he has never smoked. He has never used smokeless tobacco. He reports that he does not drink alcohol and does not use drugs. Allergies  Allergen Reactions   Gabapentin     Cognitive impairment per spouse   Lipitor [Atorvastatin]     Severe muscle cramps per spouse   Pregabalin     Cognitive impairment. Dizziness.   Prior to Admission medications   Medication Sig Start Date End Date Taking? Authorizing Provider  amitriptyline (ELAVIL) 10 MG tablet Take 20 mg by mouth at bedtime.   Yes [provider]  buprenorphine (SUBUTEX) 2 MG SUBL SL tablet Place 2 mg under the tongue at bedtime.    Yes [provider]  busPIRone (BUSPAR) 10 MG tablet Take 10-20 mg by mouth See admin instructions. Take 2 tablets in the AM and 1 tablet in the PM.   Yes [provider]  calcitRIOL (ROCALTROL) 0.25 MCG capsule Take 0.25 mcg by mouth daily.   Yes [provider]  calcium carbonate (TUMS - DOSED IN MG ELEMENTAL CALCIUM) 500 MG chewable tablet Chew 2 tablets by mouth 3 (three) times daily with meals.   Yes [provider]  carvedilol (COREG) 25 MG tablet Take 25 mg by mouth 2 (two) times daily with a meal.   Yes [provider]  cephALEXin (KEFLEX) 500 MG capsule Take 500 mg by mouth See admin instructions. Take as directed by home dialysis nurse.   Yes [provider]  Cyanocobalamin (B-12) 3000 MCG CAPS Take 3,000 mcg by mouth daily.   Yes [provider]  escitalopram (LEXAPRO) 20 MG tablet Take 20 mg by mouth daily.   Yes [provider]  furosemide (LASIX) 20 MG tablet Take 20 mg by mouth 2 (two) times daily.   Yes [provider]  imatinib (GLEEVEC) 400 MG tablet Take 400 mg by mouth daily. Take with meals and large glass of water.Caution:Chemotherapy.   Yes [provider]  insulin glargine (LANTUS) 100 UNIT/ML injection Inject 8 Units into the skin at bedtime.   Yes [provider]  levothyroxine (SYNTHROID) 175 MCG tablet Take 175 mcg by  mouth daily before breakfast.   Yes [provider]  melatonin 5 MG TABS Take 5 mg by mouth at bedtime.   Yes [provider]  Multiple Vitamin (MULTIVITAMIN WITH MINERALS) TABS tablet Take 1 tablet by mouth daily.   Yes [provider]  omeprazole (PRILOSEC) 40 MG capsule Take 40 mg by mouth 2 (two) times daily with breakfast and lunch.   Yes [provider]  ondansetron (ZOFRAN) 4 MG tablet Take 4 mg by mouth every 8 (eight) hours as needed for nausea or vomiting.   Yes [provider]  OVER THE COUNTER MEDICATION  Take 1 tablet by mouth at bedtime. Nervive - contains thiamine 1.'2mg'$ , Vit B6 1.'7mg'$ , Vit B12 2.24mg, calcium 27g, alpha-lipoic acid '600mg'$ , also turmeric and ginger   Yes [provider]  Probiotic Product (PROBIOTIC PO) Take 1 capsule by mouth daily.   Yes [provider]  rosuvastatin (CRESTOR) 10 MG tablet Take 10 mg by mouth at bedtime.   Yes [provider]  ursodiol (ACTIGALL) 300 MG capsule Take 300 mg by mouth 2 (two) times daily.   Yes [provider]  Semaglutide (OZEMPIC, 0.25 OR 0.5 MG/DOSE, [place]) Inject 0.5 mg into the skin every 7 (seven) days. Patient not taking: Reported on 10/25/2020    [provider]   Current Facility-Administered Medications  Medication Dose Route Frequency Provider Last Rate Last Admin   acetaminophen (TYLENOL) tablet 650 mg  650 mg Oral Q6H PRN SFuller PlanA, MD   650 mg at 10/25/20 1020   Or   acetaminophen (TYLENOL) suppository 650 mg  650 mg Rectal Q6H PRN SFuller PlanA, MD       acidophilus (RISAQUAD) capsule 1 capsule  1 capsule Oral Daily STamala Julian Rondell A, MD   1 capsule at 10/25/20 1153   albuterol (PROVENTIL) (2.5 MG/3ML) 0.083% nebulizer solution 2.5 mg  2.5 mg Nebulization Q6H PRN SNorval Morton MD       amitriptyline (ELAVIL) tablet 20 mg  20 mg Oral QHS Smith, Rondell A, MD       buprenorphine (SUBUTEX) SL tablet 2 mg  2 mg Sublingual Daily Smith, Rondell A, MD   2 mg at 10/25/20 1100   [START ON 10/26/2020] busPIRone (BUSPAR) tablet 20 mg  20 mg Oral Q breakfast STamala Julian Rondell A, MD   20 mg at 10/25/20 0900   And   busPIRone (BUSPAR) tablet 10 mg  10 mg Oral QPM Smith, Rondell A, MD       calcitRIOL (ROCALTROL) capsule 0.25 mcg  0.25 mcg Oral Daily STamala Julian Rondell A, MD   0.25 mcg at 10/25/20 1000   calcium carbonate (TUMS - dosed in mg elemental calcium) chewable tablet 400 mg of elemental calcium  2 tablet Oral TID WC Smith, Rondell A, MD   400 mg of elemental calcium at 10/25/20 1020    carvedilol (COREG) tablet 25 mg  25 mg Oral BID WC Smith, Rondell A, MD       dialysis solution 2.5% low-MG/low-CA dianeal solution   Intraperitoneal Q24H CDonato Heinz MD       escitalopram (LEXAPRO) tablet 20 mg  20 mg Oral Daily Smith, Rondell A, MD   20 mg at 10/25/20 1020   feeding supplement (GLUCERNA SHAKE) (GLUCERNA SHAKE) liquid 237 mL  237 mL Oral BID BM Smith, Rondell A, MD   237 mL at 10/25/20 1024   furosemide (LASIX) tablet 20 mg  20 mg Oral BID SNorval Morton MD  gentamicin cream (GARAMYCIN) 0.1 % 1 application  1 application Topical Daily Aalijah Lanphere, Broadus John, MD       heparin 1000 unit/ml injection 500 Units  500 Units Intraperitoneal PRN Donato Heinz, MD       insulin aspart (novoLOG) injection 0-6 Units  0-6 Units Subcutaneous TID WC Smith, Rondell A, MD       [START ON 10/26/2020] levothyroxine (SYNTHROID) tablet 175 mcg  175 mcg Oral QAC breakfast Smith, Rondell A, MD       melatonin tablet 5 mg  5 mg Oral QHS Smith, Rondell A, MD       ondansetron (ZOFRAN) tablet 4 mg  4 mg Oral Q6H PRN Fuller Plan A, MD       Or   ondansetron (ZOFRAN) injection 4 mg  4 mg Intravenous Q6H PRN Tamala Julian, Rondell A, MD       pantoprazole (PROTONIX) EC tablet 40 mg  40 mg Oral BID AC Smith, Rondell A, MD   40 mg at 10/25/20 1154   rosuvastatin (CRESTOR) tablet 10 mg  10 mg Oral QHS Smith, Rondell A, MD       sodium chloride flush (NS) 0.9 % injection 3 mL  3 mL Intravenous Q12H Smith, Rondell A, MD   3 mL at 10/25/20 1000   ursodiol (ACTIGALL) capsule 300 mg  300 mg Oral BID Fuller Plan A, MD   300 mg at 10/25/20 1000   vitamin B-12 (CYANOCOBALAMIN) tablet 3,000 mcg  3,000 mcg Oral Daily Fuller Plan A, MD   3,000 mcg at 10/25/20 1154   Labs: Basic Metabolic Panel: Recent Labs  Lab 10/25/20 0019  NA 136  K 3.7  CL 100  CO2 25  GLUCOSE 132*  BUN 65*  CREATININE 9.37*  CALCIUM 7.4*   Liver Function Tests: Recent Labs  Lab 10/25/20 0019  AST 32  ALT 24   ALKPHOS 104  BILITOT 0.4  PROT 5.8*  ALBUMIN 2.4*   Recent Labs  Lab 10/25/20 0019  LIPASE 42   No results for input(s): AMMONIA in the last 168 hours. CBC: Recent Labs  Lab 10/25/20 0019  WBC 11.4*  NEUTROABS 9.2*  HGB 9.5*  HCT 29.7*  MCV 103.8*  PLT 117*   Cardiac Enzymes: No results for input(s): CKTOTAL, CKMB, CKMBINDEX, TROPONINI in the last 168 hours. CBG: Recent Labs  Lab 10/25/20 1218  GLUCAP 132*   Iron Studies: No results for input(s): IRON, TIBC, TRANSFERRIN, FERRITIN in the last 72 hours. Studies/Results: CT Head Wo Contrast  Addendum Date: 10/24/2020   ADDENDUM REPORT: 10/24/2020 21:52 ADDENDUM: These results were called by telephone at the time of interpretation on 10/24/2020 at 9:38 pm to provider Fredia Sorrow , who verbally acknowledged these results. Electronically Signed   By: Ronney Asters M.D.   On: 10/24/2020 21:52   Result Date: 10/24/2020 CLINICAL DATA:  Trauma. EXAM: CT HEAD WITHOUT CONTRAST TECHNIQUE: Contiguous axial images were obtained from the base of the skull through the vertex without intravenous contrast. COMPARISON:  CT head 10/11/2020. FINDINGS: Brain: Acute subdural hemorrhage is seen overlying the falx bilaterally measuring up to 4 mm in thickness. There is also small amount of acute hemorrhage overlying the right tentorium measuring up 2 mm. There is a small amount of subarachnoid hemorrhage in the high right frontal region coronal image 4/54. There is no significant mass effect or midline shift. There is mild diffuse atrophy. There is no hydrocephalus. There is mild patchy periventricular and deep white matter hypodensity, likely chronic  small vessel ischemic change. 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 Vascular: Atherosclerotic calcifications are present within the cavernous internal carotid arteries. Skull: Normal. Negative for fracture or focal lesion. Sinuses/Orbits: No acute finding. Other: Left frontal scalp  hematomas and soft tissue swelling. IMPRESSION: 1. Small acute subdural hematomas overlying the falx bilaterally and right tentorium. No mass effect. 2. Small amount of acute subarachnoid hemorrhage in the right frontal region. 3. Left frontal scalp hematomas.  No underlying skull fracture. 4. Diffuse atrophy and chronic ischemic changes. Electronically Signed: By: Ronney Asters M.D. On: 10/24/2020 21:29    ROS: Pertinent items are noted in HPI. Physical Exam: Vitals:   10/25/20 0400 10/25/20 0457 10/25/20 0822 10/25/20 1137  BP: 97/63 129/60 (!) 122/55 (!) 119/58  Pulse: 68  68 65  Resp: '14  16 17  '$ Temp:  99.3 F (37.4 C) 98.5 F (36.9 C) 98.1 F (36.7 C)  TempSrc:  Oral Oral Oral  SpO2: 92% 93% 95% 96%  Weight:      Height:          Weight change:   Intake/Output Summary (Last 24 hours) at 10/25/2020 1352 Last data filed at 10/25/2020 1219 Gross per 24 hour  Intake 900 ml  Output --  Net 900 ml   BP (!) 119/58 (BP Location: Right Arm)   Pulse 65   Temp 98.1 F (36.7 C) (Oral)   Resp 17   Ht '5\' 6"'$  (1.676 m)   Wt 104.3 kg   SpO2 96%   BMI 37.12 kg/m  General appearance: slowed mentation Head: scalp contusion, scalp lesions, ecchymoses on forehead and eyes Resp: clear to auscultation bilaterally Cardio: regular rate and rhythm, S1, S2 normal, no murmur, click, rub or gallop GI: soft, non-tender; bowel sounds normal; no masses,  no organomegaly and PD catheter in LUQ, no erythema or drainage Extremities: edema trace ankle edema, LAVF +T/B Dialysis Access:  Dialysis Orders: Center: [facility]  on CCPD . EDW 105kg HD Bath 2.5%D  3 exchanges Time 9 hours, Fill volume 2 liters, fill time 10 minutes, dwell time 2.5 hrs, drain time 20 minutes.   Assessment/Plan:  SDH/SAH - observation per neurosurgery  ESRD -  plan for CCPD with home prescription  Hypertension/volume  - stable  Anemia  - follow H/H and transfuse prn  Metabolic bone disease -  continue with home  meds  Nutrition - renal diet, carb modified.  Donetta Potts, MD [place] Pager [phone] 10/25/2020, 1:52 PM

## 2020-10-26 DIAGNOSIS — S065XAA Traumatic subdural hemorrhage with loss of consciousness status unknown, initial encounter: Secondary | ICD-10-CM | POA: Diagnosis not present

## 2020-10-26 DIAGNOSIS — S065X0A Traumatic subdural hemorrhage without loss of consciousness, initial encounter: Secondary | ICD-10-CM | POA: Diagnosis not present

## 2020-10-26 LAB — CBC
HCT: 29.1 % — ABNORMAL LOW (ref 39.0–52.0)
Hemoglobin: 8.8 g/dL — ABNORMAL LOW (ref 13.0–17.0)
MCH: 32.4 pg (ref 26.0–34.0)
MCHC: 30.2 g/dL (ref 30.0–36.0)
MCV: 107 fL — ABNORMAL HIGH (ref 80.0–100.0)
Platelets: 115 10*3/uL — ABNORMAL LOW (ref 150–400)
RBC: 2.72 MIL/uL — ABNORMAL LOW (ref 4.22–5.81)
RDW: 16.2 % — ABNORMAL HIGH (ref 11.5–15.5)
WBC: 8 10*3/uL (ref 4.0–10.5)
nRBC: 0 % (ref 0.0–0.2)

## 2020-10-26 LAB — RENAL FUNCTION PANEL
Albumin: 2.1 g/dL — ABNORMAL LOW (ref 3.5–5.0)
Anion gap: 13 (ref 5–15)
BUN: 65 mg/dL — ABNORMAL HIGH (ref 8–23)
CO2: 24 mmol/L (ref 22–32)
Calcium: 7.6 mg/dL — ABNORMAL LOW (ref 8.9–10.3)
Chloride: 102 mmol/L (ref 98–111)
Creatinine, Ser: 9.56 mg/dL — ABNORMAL HIGH (ref 0.61–1.24)
GFR, Estimated: 5 mL/min — ABNORMAL LOW (ref >60)
Glucose, Bld: 182 mg/dL — ABNORMAL HIGH (ref 70–99)
Phosphorus: 5.3 mg/dL — ABNORMAL HIGH (ref 2.5–4.6)
Potassium: 3.7 mmol/L (ref 3.5–5.1)
Sodium: 139 mmol/L (ref 135–145)

## 2020-10-26 LAB — GLUCOSE, CAPILLARY
Glucose-Capillary: 110 mg/dL — ABNORMAL HIGH (ref 70–99)
Glucose-Capillary: 116 mg/dL — ABNORMAL HIGH (ref 70–99)

## 2020-10-26 LAB — FOLATE: Folate: 19.1 ng/mL (ref >5.9)

## 2020-10-26 NOTE — Discharge Summary (Signed)
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 the COVID-19 declaration under Section 564(b)(1) of the Act, 21 U.S.C.section 360bbb-3(b)(1), unless the authorization is terminated  or revoked sooner.       Influenza A by PCR NEGATIVE NEGATIVE Final   Influenza B by PCR NEGATIVE NEGATIVE Final    Comment: (NOTE) The Xpert Xpress SARS-CoV-2/FLU/RSV plus assay is intended as an aid in the diagnosis of influenza from Nasopharyngeal swab specimens and should not be used as a sole basis for treatment. Nasal washings  and aspirates are unacceptable for Xpert Xpress SARS-CoV-2/FLU/RSV testing.  Fact Sheet for Patients: EntrepreneurPulse.com.au  Fact Sheet for Healthcare  Providers: IncredibleEmployment.be  This test is not yet approved or cleared by the [place] FDA and has been authorized for detection and/or diagnosis of SARS-CoV-2 by FDA under an Emergency Use Authorization (EUA). This EUA will remain in effect (meaning this test can be used) for the duration of the COVID-19 declaration under Section 564(b)(1) of the Act, 21 U.S.C. section 360bbb-3(b)(1), unless the authorization is terminated or revoked.  Performed at [facility], [address] [place], [place] [address]      Time coordinating discharge: Over 30 minutes  SIGNED:   Donnamae Jude, MD  [facility] 10/26/2020, 10:48 AM  If 7PM-7AM, please contact night-coverage

## 2020-10-26 NOTE — Progress Notes (Signed)
Physical Therapy Treatment Patient Details Name: Chase [name] MRN: [id number] DOB: 12/06/1950 Today's Date: 10/26/2020   History of Present Illness 70 y.o. male presents to [facility] hospital on 10/25/2020 s/p fall. CT head demonstrates tiny SDH along the falx and tentorium. Of note pt presented to [facility] 10/6 due to a fall also. Pt reports he gets dizzy, passes out, and falls. PMH includes back pain, depression, CAD, DM, CKD, HTN.    PT Comments    Patient progressing towards physical therapy goals. Patient ambulating with RW at supervision level. Patient reports unsteadiness with UE unsupported in standing. Patient requests use of RW for support and comfort. Patient reports R knee pain with ambulation due to recent fall. Continue to recommend OPPT following discharge to address balance and strength deficits.     Recommendations for follow up therapy are one component of a multi-disciplinary discharge planning process, led by the attending physician.  Recommendations may be updated based on patient status, additional functional criteria and insurance authorization.  Follow Up Recommendations  Outpatient PT     Equipment Recommendations  None recommended by PT    Recommendations for Other Services       Precautions / Restrictions Precautions Precautions: Fall Restrictions Weight Bearing Restrictions: No     Mobility  Bed Mobility Overal bed mobility: Independent                  Transfers Overall transfer level: Needs assistance Equipment used: Rolling Carianna Lague (2 wheeled) Transfers: Sit to/from Stand Sit to Stand: Supervision            Ambulation/Gait Ambulation/Gait assistance: Supervision Gait Distance (Feet): 250 Feet Assistive device: Rolling Yi Falletta (2 wheeled) Gait Pattern/deviations: Step-through pattern Gait velocity: reduced   General Gait Details: supervision for safety. Steady gait with use of RW. unsteady with turns and changing directions  (backwards)   Stairs             Wheelchair Mobility    Modified Rankin (Stroke Patients Only)       Balance Overall balance assessment: Needs assistance Sitting-balance support: No upper extremity supported;Feet supported       Standing balance support: No upper extremity supported Standing balance-Leahy Scale: Fair Standing balance comment: patient reports unsteadiness with standing unsupported. Requests use of RW                            Cognition Arousal/Alertness: Awake/alert Behavior During Therapy: WFL for tasks assessed/performed Overall Cognitive Status: Within Functional Limits for tasks assessed                                        Exercises      General Comments        Pertinent Vitals/Pain Pain Assessment: Faces Faces Pain Scale: Hurts a little bit Pain Location: R knee Pain Descriptors / Indicators: Aching Pain Intervention(s): Monitored during session    Home Living                      Prior Function            PT Goals (current goals can now be found in the care plan section) Acute Rehab PT Goals Patient Stated Goal: to reduce falls risk PT Goal Formulation: With patient Time For Goal Achievement: 11/08/20 Potential to Achieve Goals: Good Progress towards PT goals:  Progressing toward goals    Frequency    Min 3X/week      PT Plan Current plan remains appropriate    Co-evaluation              AM-PAC PT "6 Clicks" Mobility   Outcome Measure  Help needed turning from your back to your side while in a flat bed without using bedrails?: None Help needed moving from lying on your back to sitting on the side of a flat bed without using bedrails?: None Help needed moving to and from a bed to a chair (including a wheelchair)?: A Little Help needed standing up from a chair using your arms (e.g., wheelchair or bedside chair)?: A Little Help needed to walk in hospital room?: A  Little Help needed climbing 3-5 steps with a railing? : A Little 6 Click Score: 20    End of Session   Activity Tolerance: Patient tolerated treatment well Patient left: in chair (handoff to OT) Nurse Communication: Mobility status PT Visit Diagnosis: History of falling (Z91.81);Other abnormalities of gait and mobility (R26.89)     Time: 8185-9093 PT Time Calculation (min) (ACUTE ONLY): 20 min  Charges:  $Gait Training: 8-22 mins                     Bell Cai A. Gilford Rile PT, DPT Acute Rehabilitation Services Pager [phone] Office [phone]    Linna Hoff 10/26/2020, 11:27 AM

## 2020-10-26 NOTE — TOC CAGE-AID Note (Signed)
Transition of Care [facility]) - CAGE-AID Screening   Patient Details  Name: Chase [name] MRN: [id number] Date of Birth: 1950/04/03  [facility]) CM/SW Contact:    Gaetano Hawthorne Tarpley-Carter, LCSWA Phone Number: 10/26/2020, 10:54 AM   Clinical Narrative: Pt participated in [place].  Pt stated he does not use substance or ETOH.  Pt was not offered resources, due to no usage of substance or ETOH.     Danzig Macgregor Tarpley-Carter, MSW, LCSW-A Pronouns:  She/Her/Hers [facility]itions of [facility] Clinical Social Worker Direct Number:  [phone] [email or web address]  CAGE-AID Screening:    Have You Ever Felt You Ought to Cut Down on Your Drinking or Drug Use?: No Have People Annoyed You By [employer] Your Drinking Or Drug Use?: No Have You Felt Bad Or Guilty About Your Drinking Or Drug Use?: No Have You Ever Had a Drink or Used Drugs First Thing In The Morning to Steady Your Nerves or to Get Rid of a Hangover?: No CAGE-AID Score: 0  Substance Abuse Education Offered: No

## 2020-10-26 NOTE — Progress Notes (Signed)
Noted PT recommendations for outpt PT- spoke with pt who reports he is already going to outpt therapy for PT in [place] and plans to continue with his current therapy. Pt to f/u with his outpt therapy as previously ordered- location at [facility].  No further TOC needs noted.

## 2020-10-26 NOTE — Evaluation (Signed)
Occupational Therapy Evaluation Patient Details Name: Chase [name] MRN: [id number] DOB: [birth year] Today's Date: 10/26/2020   History of Present Illness 70 y.o. male presents to [facility] hospital on 10/25/2020 s/p fall. CT head demonstrates tiny SDH along the falx and tentorium. Of note pt presented to [facility] 10/6 due to a fall also. Pt reports he gets dizzy, passes out, and falls. PMH includes back pain, depression, CAD, DM, CKD, HTN.   Clinical Impression   PT admitted with s/p fall with SDH. Pt currently with functional limitiations due to the deficits listed below (see OT problem list). Pt demonstrates balance deficits with Adls and will need family close (A) with adls. Pt educated on seated positions and use of AE for LB dressing.  Pt will benefit from skilled OT to increase their independence and safety with adls and balance to allow discharge [place].       Recommendations for follow up therapy are one component of a multi-disciplinary discharge planning process, led by the attending physician.  Recommendations may be updated based on patient status, additional functional criteria and insurance authorization.   Follow Up Recommendations  Home health OT    Equipment Recommendations  None recommended by OT    Recommendations for Other Services PT consult     Precautions / Restrictions Precautions Precautions: Fall      Mobility Bed Mobility               General bed mobility comments: oob on arrival    Transfers Overall transfer level: Needs assistance Equipment used: Rolling walker (2 wheeled) Transfers: Sit to/from Stand Sit to Stand: Supervision              Balance Overall balance assessment: Needs assistance Sitting-balance support: No upper extremity supported;Feet supported Sitting balance-Leahy Scale: Good     Standing balance support: No upper extremity supported (eyes closed with minG) Standing balance-Leahy Scale: Fair                              ADL either performed or assessed with clinical judgement   ADL Overall ADL's : Needs assistance/impaired Eating/Feeding: Set up;Sitting                   Lower Body Dressing: Min guard;Sit to/from stand;With adaptive equipment Lower Body Dressing Details (indicate cue type and reason): reacher for R LE. pt able to figure 4 cross L LE Toilet Transfer: Min guard;RW           Functional mobility during ADLs: Min guard;Rolling walker General ADL Comments: educated on safety with transfer, use of urinal at night, wife to (A) with first few showers. using seated position to feed dog instead of hip flexion     Vision Baseline Vision/History: 1 Wears glasses Additional Comments: baseline with peripheral deficits in L eye and midline down deficits. Pt has to demonstrate head turns and scans with R eye at baseline.     Perception     Praxis      Pertinent Vitals/Pain Pain Assessment: Faces Faces Pain Scale: Hurts a little bit Pain Location: R knee Pain Descriptors / Indicators: Aching Pain Intervention(s): Monitored during session;Repositioned     Hand Dominance Right   Extremity/Trunk Assessment Upper Extremity Assessment Upper Extremity Assessment: Overall WFL for tasks assessed   Lower Extremity Assessment Lower Extremity Assessment: Defer to PT evaluation   Cervical / Trunk Assessment Cervical / Trunk Assessment: Normal   Communication Communication  Communication: No difficulties   Cognition Arousal/Alertness: Awake/alert Behavior During Therapy: WFL for tasks assessed/performed Overall Cognitive Status: Within Functional Limits for tasks assessed                                     General Comments  VSS. pt declines dizziness. pt reports no changes in vision from edema on face. pt reports starting PT prior to fall    Exercises     Shoulder Instructions      Home Living Family/patient expects to be discharged to:: Private  residence Living Arrangements: Spouse/significant other Available Help at Discharge: Family;Available PRN/intermittently (spouse works 3 days 4 hours) Type of Home: Apartment Home Access: Level entry     Home Layout: One level     Bathroom Shower/Tub: Teacher, early years/pre: Standard     Home Equipment: Environmental consultant - 2 wheels;Walker - 4 wheels;Cane - single point   Additional Comments: wife is able to (A) at home      Prior Functioning/Environment Level of Independence: Independent with assistive device(s)        Comments: pt reports ambulating with use of cane, RW, or rollator at baseline. Pt has stopped utilizing cane recently due to falls.        OT Problem List: Decreased strength;Decreased activity tolerance;Impaired balance (sitting and/or standing);Decreased safety awareness;Decreased cognition;Decreased knowledge of use of DME or AE;Decreased knowledge of precautions;Obesity      OT Treatment/Interventions: Self-care/ADL training;Therapeutic exercise;Energy conservation;DME and/or AE instruction;Therapeutic activities;Cognitive remediation/compensation;Balance training;Patient/family education;Visual/perceptual remediation/compensation    OT Goals(Current goals can be found in the care plan section) Acute Rehab OT Goals Patient Stated Goal: to reduce falls risk OT Goal Formulation: With patient Time For Goal Achievement: 11/09/20 Potential to Achieve Goals: Good  OT Frequency: Min 2X/week   Barriers to D/C:            Co-evaluation              AM-PAC OT "6 Clicks" Daily Activity     Outcome Measure Help from another person eating meals?: A Little Help from another person taking care of personal grooming?: A Little Help from another person toileting, which includes using toliet, bedpan, or urinal?: A Lot Help from another person bathing (including washing, rinsing, drying)?: A Lot Help from another person to put on and taking off regular upper  body clothing?: A Little Help from another person to put on and taking off regular lower body clothing?: A Lot 6 Click Score: 15   End of Session Equipment Utilized During Treatment: Gait belt;Rolling walker Nurse Communication: Mobility status;Precautions  Activity Tolerance: Patient tolerated treatment well Patient left: in chair;with call bell/phone within reach;with chair alarm set  OT Visit Diagnosis: Muscle weakness (generalized) (M62.81)                Time: 1115-1130 OT Time Calculation (min): 15 min Charges:  OT General Charges $OT Visit: 1 Visit OT Evaluation $OT Eval Moderate Complexity: 1 Mod   Brynn, OTR/L  Acute Rehabilitation Services Pager: [phone] Office: [phone] .   Jeri Modena 10/26/2020, 4:02 PM

## 2020-10-26 NOTE — Progress Notes (Signed)
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  Subjective:  Tolerated PD overnight, no issues; UF 1.1L using 2.5% dextrose  10/20 0701 - 10/21 0700 In: 750 [P.O.:750] Out: -   Filed Weights   10/25/20 1914 10/26/20 0616 10/26/20 [id number]  Weight: 106.1 kg 105.6 kg 104.7 kg    Scheduled Meds:  acidophilus  1 capsule Oral Daily   amitriptyline  20 mg Oral QHS   buprenorphine  2 mg Sublingual Daily   busPIRone  20 mg Oral Q breakfast   And   busPIRone  10 mg Oral QPM   calcitRIOL  0.25 mcg Oral Daily   calcium carbonate  2 tablet Oral TID WC   carvedilol  25 mg Oral BID WC   escitalopram  20 mg Oral Daily   feeding supplement (GLUCERNA SHAKE)  237 mL Oral BID BM   furosemide  20 mg Oral BID   gentamicin cream  1 application Topical Daily   insulin aspart  0-6 Units Subcutaneous TID WC   levothyroxine  175 mcg Oral QAC breakfast   melatonin  5 mg Oral QHS   pantoprazole  40 mg Oral BID AC   rosuvastatin  10 mg Oral QHS   sodium chloride flush  3 mL Intravenous Q12H   ursodiol  300 mg Oral BID   vitamin B-12  3,000 mcg Oral Daily   Continuous Infusions: PRN Meds:.acetaminophen **OR** acetaminophen, albuterol, dianeal solution for CAPD/CCPD with heparin, ondansetron **OR** ondansetron (ZOFRAN) IV  Current Labs: reviewed   Physical Exam:  Blood pressure (!) 143/68, pulse 68, temperature 98 F (36.7 C), temperature source Oral, resp. rate 11, height '5\' 6"'$  (1.676 m), weight 104.7 kg, SpO2 96 %. NAD Facial brusing CTAB RRR S/nt/nd No LEE LAVF +T/B   Dialysis Orders: Center: [facility]  on CCPD . EDW 105kg HD Bath 2.5%D  3 exchanges Time 9 hours, Fill volume 2 liters, fill time 10 minutes, dwell time 2.5 hrs, drain time 20 minutes.   A SDH seen by NSG, no surgery, PT/OT ESRD cont CCPD outpt Rx HTN/Vol no issues Anemia, Trend Hb CKD-BMD, cont outpt mgmt  P As above, Cont PD while inpatient Medication Issues; Preferred narcotic agents for pain  control are hydromorphone, fentanyl, and methadone. Morphine should not be used.  Baclofen should be avoided Avoid oral sodium phosphate and magnesium citrate based laxatives / bowel preps    Pearson Grippe MD 10/26/2020, 10:57 AM  Recent Labs  Lab 10/25/20 0019 10/26/20 0325  NA 136 139  K 3.7 3.7  CL 100 102  CO2 25 24  GLUCOSE 132* 182*  BUN 65* 65*  CREATININE 9.37* 9.56*  CALCIUM 7.4* 7.6*  PHOS  --  5.3*   Recent Labs  Lab 10/25/20 0019 10/26/20 0325  WBC 11.4* 8.0  NEUTROABS 9.2*  --   HGB 9.5* 8.8*  HCT 29.7* 29.1*  MCV 103.8* 107.0*  PLT 117* 115*

## 2021-05-06 ENCOUNTER — Emergency Department (HOSPITAL_BASED_OUTPATIENT_CLINIC_OR_DEPARTMENT_OTHER): Payer: Medicare Other

## 2021-05-06 ENCOUNTER — Encounter (HOSPITAL_BASED_OUTPATIENT_CLINIC_OR_DEPARTMENT_OTHER): Payer: Self-pay | Admitting: Emergency Medicine

## 2021-05-06 ENCOUNTER — Other Ambulatory Visit: Payer: Self-pay

## 2021-05-06 ENCOUNTER — Emergency Department (HOSPITAL_BASED_OUTPATIENT_CLINIC_OR_DEPARTMENT_OTHER)
Admission: EM | Admit: 2021-05-06 | Discharge: 2021-05-07 | Disposition: A | Discharge status: Skilled Nursing Facility | Payer: Medicare Other | Attending: Emergency Medicine | Admitting: Emergency Medicine

## 2021-05-06 DIAGNOSIS — Z992 Dependence on renal dialysis: Secondary | ICD-10-CM | POA: Insufficient documentation

## 2021-05-06 DIAGNOSIS — S0003XA Contusion of scalp, initial encounter: Secondary | ICD-10-CM | POA: Insufficient documentation

## 2021-05-06 DIAGNOSIS — W19XXXA Unspecified fall, initial encounter: Secondary | ICD-10-CM | POA: Diagnosis not present

## 2021-05-06 DIAGNOSIS — Z794 Long term (current) use of insulin: Secondary | ICD-10-CM | POA: Diagnosis not present

## 2021-05-06 DIAGNOSIS — S0990XA Unspecified injury of head, initial encounter: Secondary | ICD-10-CM | POA: Diagnosis present

## 2021-05-06 DIAGNOSIS — E1122 Type 2 diabetes mellitus with diabetic chronic kidney disease: Secondary | ICD-10-CM | POA: Insufficient documentation

## 2021-05-06 DIAGNOSIS — N186 End stage renal disease: Secondary | ICD-10-CM | POA: Insufficient documentation

## 2021-05-06 LAB — BASIC METABOLIC PANEL
Anion gap: 8 (ref 5–15)
BUN: 32 mg/dL — ABNORMAL HIGH (ref 8–23)
CO2: 27 mmol/L (ref 22–32)
Calcium: 7.9 mg/dL — ABNORMAL LOW (ref 8.9–10.3)
Chloride: 102 mmol/L (ref 98–111)
Creatinine, Ser: 3.65 mg/dL — ABNORMAL HIGH (ref 0.61–1.24)
GFR, Estimated: 17 mL/min — ABNORMAL LOW (ref >60)
Glucose, Bld: 138 mg/dL — ABNORMAL HIGH (ref 70–99)
Potassium: 4 mmol/L (ref 3.5–5.1)
Sodium: 137 mmol/L (ref 135–145)

## 2021-05-06 LAB — CBC WITH DIFFERENTIAL/PLATELET
Abs Immature Granulocytes: 0.03 10*3/uL (ref 0.00–0.07)
Basophils Absolute: 0.1 10*3/uL (ref 0.0–0.1)
Basophils Relative: 1 %
Eosinophils Absolute: 0.6 10*3/uL — ABNORMAL HIGH (ref 0.0–0.5)
Eosinophils Relative: 8 %
HCT: 27.3 % — ABNORMAL LOW (ref 39.0–52.0)
Hemoglobin: 8.6 g/dL — ABNORMAL LOW (ref 13.0–17.0)
Immature Granulocytes: 0 %
Lymphocytes Relative: 12 %
Lymphs Abs: 1 10*3/uL (ref 0.7–4.0)
MCH: 32.3 pg (ref 26.0–34.0)
MCHC: 31.5 g/dL (ref 30.0–36.0)
MCV: 102.6 fL — ABNORMAL HIGH (ref 80.0–100.0)
Monocytes Absolute: 0.5 10*3/uL (ref 0.1–1.0)
Monocytes Relative: 6 %
Neutro Abs: 6 10*3/uL (ref 1.7–7.7)
Neutrophils Relative %: 73 %
Platelets: 139 10*3/uL — ABNORMAL LOW (ref 150–400)
RBC: 2.66 MIL/uL — ABNORMAL LOW (ref 4.22–5.81)
RDW: 16 % — ABNORMAL HIGH (ref 11.5–15.5)
WBC: 8.2 10*3/uL (ref 4.0–10.5)
nRBC: 0 % (ref 0.0–0.2)

## 2021-05-06 NOTE — ED Triage Notes (Signed)
Pt lost balance and fell backward striking head on ground. Pt has hematoma to back of head.  ?

## 2021-05-06 NOTE — ED Notes (Signed)
Pt complaining EKG electrode causes red welps on skin. Pt is asking to take the electrodes off. Dr Wyvonnia Dusky informed. Ok if pt insist. Electrodes taken off pt. ?

## 2021-05-06 NOTE — Discharge Instructions (Signed)
Your testing is negative for serious traumatic injury.  You do have some fluid in your left lung with what looks to be stable compared to January.  Follow-up with your doctor for your dialysis as scheduled.  Return to the ED with chest pain, difficulty breathing or any other concerns ?

## 2021-05-06 NOTE — ED Notes (Signed)
Patient transported to CT 

## 2021-05-06 NOTE — ED Notes (Signed)
Patient denies pain and is resting comfortably.  

## 2021-05-06 NOTE — ED Notes (Signed)
First attempt to call [facility] [phone] no answer  ?

## 2021-05-06 NOTE — ED Provider Notes (Signed)
?[facility] ?Provider Note ? ? ?CSN: [id number] ?Arrival date & time: 05/06/21  2005 ? ?  ? ?History ? ?Chief Complaint  ?Patient presents with  ? Fall  ? ? ?Chase [name] is a 71 y.o. male. ? ?Patient with a history of ESRD on dialysis Monday, Wednesday and Friday, GI stromal tumor, pericardial effusion, diabetes no anticoagulation presenting with fall from his facility.  States he was using his walker and was trying to put some traction to the trash can.  Some of the trash was floating in the air he tried to grab it but fell backward striking his head.  Did not lose consciousness.  Members the whole fall.  Complains of pain to his back of his head only.  Denies any neck or back pain.  No chest pain or shortness of breath.  No abdominal pain.  No fever.  Denies pain anywhere else.  Did have dialysis today.  No blood thinner use. ? ?The history is provided by the patient and the EMS personnel.  ?Fall ?Associated symptoms include headaches. Pertinent negatives include no abdominal pain and no shortness of breath.  ? ?  ? ?Home Medications ?Prior to Admission medications   ?Medication Sig Start Date End Date Taking? Authorizing Provider  ?amitriptyline (ELAVIL) 10 MG tablet Take 20 mg by mouth at bedtime.    [provider]  ?buprenorphine (SUBUTEX) 2 MG SUBL SL tablet Place 2 mg under the tongue at bedtime.    [provider]  ?busPIRone (BUSPAR) 10 MG tablet Take 10-20 mg by mouth See admin instructions. Take 2 tablets in the AM and 1 tablet in the PM.    [provider]  ?calcitRIOL (ROCALTROL) 0.25 MCG capsule Take 0.25 mcg by mouth daily.    [provider]  ?calcium carbonate (TUMS - DOSED IN MG ELEMENTAL CALCIUM) 500 MG chewable tablet Chew 2 tablets by mouth 3 (three) times daily with meals.    [provider]  ?carvedilol (COREG) 25 MG tablet Take 25 mg by mouth 2 (two) times daily with a meal.    [provider]  ?cephALEXin  (KEFLEX) 500 MG capsule Take 500 mg by mouth See admin instructions. Take as directed by home dialysis nurse.    [provider]  ?Cyanocobalamin (B-12) 3000 MCG CAPS Take 3,000 mcg by mouth daily.    [provider]  ?escitalopram (LEXAPRO) 20 MG tablet Take 20 mg by mouth daily.    [provider]  ?furosemide (LASIX) 20 MG tablet Take 20 mg by mouth 2 (two) times daily.    [provider]  ?imatinib (GLEEVEC) 400 MG tablet Take 400 mg by mouth daily. Take with meals and large glass of water.Caution:Chemotherapy.    [provider]  ?insulin glargine (LANTUS) 100 UNIT/ML injection Inject 8 Units into the skin at bedtime.    [provider]  ?levothyroxine (SYNTHROID) 175 MCG tablet Take 175 mcg by mouth daily before breakfast.    [provider]  ?melatonin 5 MG TABS Take 5 mg by mouth at bedtime.    [provider]  ?Multiple Vitamin (MULTIVITAMIN WITH MINERALS) TABS tablet Take 1 tablet by mouth daily.    [provider]  ?omeprazole (PRILOSEC) 40 MG capsule Take 40 mg by mouth 2 (two) times daily with breakfast and lunch.    [provider]  ?ondansetron (ZOFRAN) 4 MG tablet Take 4 mg by mouth every 8 (eight) hours as needed for nausea or vomiting.  [provider]  ?OVER THE COUNTER MEDICATION Take 1 tablet by mouth at bedtime. Nervive - contains thiamine 1.2mg , Vit B6 1.7mg , Vit B12 2.32mcg, calcium 27g, alpha-lipoic acid 600mg , also turmeric and ginger    [provider]  ?Probiotic Product (PROBIOTIC PO) Take 1 capsule by mouth daily.    [provider]  ?rosuvastatin (CRESTOR) 10 MG tablet Take 10 mg by mouth at bedtime.    [provider]  ?Semaglutide (OZEMPIC, 0.25 OR 0.5 MG/DOSE, [place]) Inject 0.5 mg into the skin every 7 (seven) days. ?Patient not taking: Reported on 10/25/2020    [provider]  ?ursodiol (ACTIGALL) 300 MG capsule Take 300 mg by mouth 2 (two) times  daily.    [provider]  ?   ? ?Allergies    ?Gabapentin, Lipitor [atorvastatin], and Pregabalin   ? ?Review of Systems   ?Review of Systems  ?Constitutional:  Negative for activity change, appetite change and fever.  ?HENT:  Negative for congestion and rhinorrhea.   ?Respiratory:  Negative for cough, chest tightness and shortness of breath.   ?Gastrointestinal:  Negative for abdominal pain, nausea and vomiting.  ?Genitourinary:  Negative for dysuria and hematuria.  ?Musculoskeletal:  Positive for arthralgias and myalgias. Negative for back pain.  ?Neurological:  Positive for headaches. Negative for dizziness, weakness and light-headedness.  ? all other systems are negative except as noted in the HPI and PMH.  ? ?Physical Exam ?Updated Vital Signs ?BP 119/60   Pulse 76   Temp 98.5 ?F (36.9 ?C) (Oral)   Resp 16   Ht 5\' 6"  (1.676 m)   Wt 90.7 kg   SpO2 94%   BMI 32.28 kg/m?  ?Physical Exam ?Vitals and nursing note reviewed.  ?Constitutional:   ?   General: He is not in acute distress. ?   Appearance: He is well-developed.  ?   Comments: Chronically ill appearing  ?HENT:  ?   Head: Normocephalic.  ?   Comments: Hematoma occipital scalp ?   Nose:  ?   Comments: Old appearing ecchymosis to face ?   Mouth/Throat:  ?   Pharynx: No oropharyngeal exudate.  ?Eyes:  ?   Conjunctiva/sclera: Conjunctivae normal.  ?   Pupils: Pupils are equal, round, and reactive to light.  ?Neck:  ?   Comments: No meningismus. ?Cardiovascular:  ?   Rate and Rhythm: Normal rate and regular rhythm.  ?   Heart sounds: Normal heart sounds. No murmur heard. ?Pulmonary:  ?   Effort: Pulmonary effort is normal. No respiratory distress.  ?   Breath sounds: Normal breath sounds.  ?Chest:  ?   Chest wall: No tenderness.  ?Abdominal:  ?   Palpations: Abdomen is soft.  ?   Tenderness: There is no abdominal tenderness. There is no guarding or rebound.  ?   Comments: PD catheter in place  ?Musculoskeletal:     ?   General: No tenderness.  Normal range of motion.  ?   Cervical back: Normal range of motion and neck supple.  ?   Comments: FROM hips without pain  ?Skin: ?   General: Skin is warm.  ?   Capillary Refill: Capillary refill takes less than 2 seconds.  ?Neurological:  ?   General: No focal deficit present.  ?   Mental Status: He is alert and oriented to person, place, and time. Mental status is at baseline.  ?   Cranial Nerves: No cranial nerve deficit.  ?   Motor:  No abnormal muscle tone.  ?   Coordination: Coordination normal.  ?   Comments: No ataxia on finger to nose bilaterally. No pronator drift. 5/5 strength throughout. CN 2-12 intact.Equal grip strength. Sensation intact.   ?Psychiatric:     ?   Behavior: Behavior normal.  ? ? ?ED Results / Procedures / Treatments   ?Labs ?(all labs ordered are listed, but only abnormal results are displayed) ?Labs Reviewed  ?CBC WITH DIFFERENTIAL/PLATELET - Abnormal; Notable for the following components:  ?    Result Value  ? RBC 2.66 (*)   ? Hemoglobin 8.6 (*)   ? HCT 27.3 (*)   ? MCV 102.6 (*)   ? RDW 16.0 (*)   ? Platelets 139 (*)   ? Eosinophils Absolute 0.6 (*)   ? All other components within normal limits  ?BASIC METABOLIC PANEL - Abnormal; Notable for the following components:  ? Glucose, Bld 138 (*)   ? BUN 32 (*)   ? Creatinine, Ser 3.65 (*)   ? Calcium 7.9 (*)   ? GFR, Estimated 17 (*)   ? All other components within normal limits  ? ? ?EKG ?EKG Interpretation ? ?Date/Time:  Monday May 06 2021 20:17:50 EDT ?Ventricular Rate:  75 ?PR Interval:  167 ?QRS Duration: 89 ?QT Interval:  390 ?QTC Calculation: 436 ?R Axis:   -21 ?Text Interpretation: Sinus rhythm Borderline left axis deviation Low voltage, extremity leads Consider anterior infarct No significant change was found Confirmed by Ezequiel Essex [phone]) on 05/06/2021 8:22:18 PM ? ?Radiology ?DG Chest 2 View ? ?Result Date: 05/06/2021 ?CLINICAL DATA:  Status post fall. EXAM: CHEST - 2 VIEW COMPARISON:  January 29, 2021 FINDINGS: A right-sided  venous catheter is in place. Mild, stable right upper lobe linear scarring is seen, with moderate severity left basilar atelectasis and/or infiltrate. There is a small left pleural effusion. The cardiac silhouette

## 2021-12-15 ENCOUNTER — Other Ambulatory Visit: Payer: Self-pay

## 2021-12-15 ENCOUNTER — Emergency Department (HOSPITAL_BASED_OUTPATIENT_CLINIC_OR_DEPARTMENT_OTHER): Payer: Medicare Other

## 2021-12-15 ENCOUNTER — Encounter (HOSPITAL_BASED_OUTPATIENT_CLINIC_OR_DEPARTMENT_OTHER): Payer: Self-pay | Admitting: Urology

## 2021-12-15 ENCOUNTER — Emergency Department (HOSPITAL_BASED_OUTPATIENT_CLINIC_OR_DEPARTMENT_OTHER)
Admission: EM | Admit: 2021-12-15 | Discharge: 2021-12-15 | Disposition: A | Discharge status: Home / Self Care | Payer: Medicare Other | Attending: Emergency Medicine | Admitting: Emergency Medicine

## 2021-12-15 DIAGNOSIS — Z1152 Encounter for screening for COVID-19: Secondary | ICD-10-CM | POA: Diagnosis not present

## 2021-12-15 DIAGNOSIS — E1122 Type 2 diabetes mellitus with diabetic chronic kidney disease: Secondary | ICD-10-CM | POA: Insufficient documentation

## 2021-12-15 DIAGNOSIS — R4182 Altered mental status, unspecified: Secondary | ICD-10-CM | POA: Diagnosis present

## 2021-12-15 DIAGNOSIS — Z79899 Other long term (current) drug therapy: Secondary | ICD-10-CM | POA: Insufficient documentation

## 2021-12-15 DIAGNOSIS — I120 Hypertensive chronic kidney disease with stage 5 chronic kidney disease or end stage renal disease: Secondary | ICD-10-CM | POA: Diagnosis not present

## 2021-12-15 DIAGNOSIS — N186 End stage renal disease: Secondary | ICD-10-CM | POA: Diagnosis not present

## 2021-12-15 DIAGNOSIS — N3000 Acute cystitis without hematuria: Secondary | ICD-10-CM | POA: Insufficient documentation

## 2021-12-15 DIAGNOSIS — Z992 Dependence on renal dialysis: Secondary | ICD-10-CM | POA: Insufficient documentation

## 2021-12-15 DIAGNOSIS — Z794 Long term (current) use of insulin: Secondary | ICD-10-CM | POA: Insufficient documentation

## 2021-12-15 DIAGNOSIS — R058 Other specified cough: Secondary | ICD-10-CM | POA: Insufficient documentation

## 2021-12-15 DIAGNOSIS — E119 Type 2 diabetes mellitus without complications: Secondary | ICD-10-CM | POA: Insufficient documentation

## 2021-12-15 DIAGNOSIS — R410 Disorientation, unspecified: Secondary | ICD-10-CM

## 2021-12-15 LAB — COMPREHENSIVE METABOLIC PANEL
ALT: 29 U/L (ref 0–44)
AST: 30 U/L (ref 15–41)
Albumin: 2.5 g/dL — ABNORMAL LOW (ref 3.5–5.0)
Alkaline Phosphatase: 151 U/L — ABNORMAL HIGH (ref 38–126)
Anion gap: 14 (ref 5–15)
BUN: 80 mg/dL — ABNORMAL HIGH (ref 8–23)
CO2: 26 mmol/L (ref 22–32)
Calcium: 8.7 mg/dL — ABNORMAL LOW (ref 8.9–10.3)
Chloride: 96 mmol/L — ABNORMAL LOW (ref 98–111)
Creatinine, Ser: 9.53 mg/dL — ABNORMAL HIGH (ref 0.61–1.24)
GFR, Estimated: 5 mL/min — ABNORMAL LOW (ref >60)
Glucose, Bld: 230 mg/dL — ABNORMAL HIGH (ref 70–99)
Potassium: 4.1 mmol/L (ref 3.5–5.1)
Sodium: 136 mmol/L (ref 135–145)
Total Bilirubin: 0.7 mg/dL (ref 0.3–1.2)
Total Protein: 6.7 g/dL (ref 6.5–8.1)

## 2021-12-15 LAB — CBC WITH DIFFERENTIAL/PLATELET
Abs Immature Granulocytes: 0.03 10*3/uL (ref 0.00–0.07)
Basophils Absolute: 0 10*3/uL (ref 0.0–0.1)
Basophils Relative: 0 %
Eosinophils Absolute: 0.3 10*3/uL (ref 0.0–0.5)
Eosinophils Relative: 2 %
HCT: 22.8 % — ABNORMAL LOW (ref 39.0–52.0)
Hemoglobin: 7.2 g/dL — ABNORMAL LOW (ref 13.0–17.0)
Immature Granulocytes: 0 %
Lymphocytes Relative: 10 %
Lymphs Abs: 1.1 10*3/uL (ref 0.7–4.0)
MCH: 30.5 pg (ref 26.0–34.0)
MCHC: 31.6 g/dL (ref 30.0–36.0)
MCV: 96.6 fL (ref 80.0–100.0)
Monocytes Absolute: 0.6 10*3/uL (ref 0.1–1.0)
Monocytes Relative: 6 %
Neutro Abs: 8.5 10*3/uL — ABNORMAL HIGH (ref 1.7–7.7)
Neutrophils Relative %: 82 %
Platelets: 130 10*3/uL — ABNORMAL LOW (ref 150–400)
RBC: 2.36 MIL/uL — ABNORMAL LOW (ref 4.22–5.81)
RDW: 16.7 % — ABNORMAL HIGH (ref 11.5–15.5)
WBC: 10.4 10*3/uL (ref 4.0–10.5)
nRBC: 0.2 % (ref 0.0–0.2)

## 2021-12-15 LAB — URINALYSIS, ROUTINE W REFLEX MICROSCOPIC
Bilirubin Urine: NEGATIVE
Glucose, UA: 100 mg/dL — AB
Ketones, ur: NEGATIVE mg/dL
Nitrite: NEGATIVE
Protein, ur: 100 mg/dL — AB
Specific Gravity, Urine: 1.015 (ref 1.005–1.030)
pH: 5.5 (ref 5.0–8.0)

## 2021-12-15 LAB — RESP PANEL BY RT-PCR (RSV, FLU A&B, COVID)  RVPGX2
Influenza A by PCR: NEGATIVE
Influenza B by PCR: NEGATIVE
Resp Syncytial Virus by PCR: NEGATIVE
SARS Coronavirus 2 by RT PCR: NEGATIVE

## 2021-12-15 LAB — PHOSPHORUS: Phosphorus: 4.7 mg/dL — ABNORMAL HIGH (ref 2.5–4.6)

## 2021-12-15 LAB — URINALYSIS, MICROSCOPIC (REFLEX)

## 2021-12-15 LAB — LACTIC ACID, PLASMA: Lactic Acid, Venous: 1.6 mmol/L (ref 0.5–1.9)

## 2021-12-15 LAB — TSH: TSH: 4.492 u[IU]/mL (ref 0.350–4.500)

## 2021-12-15 LAB — MAGNESIUM: Magnesium: 1.3 mg/dL — ABNORMAL LOW (ref 1.7–2.4)

## 2021-12-15 LAB — PROTIME-INR
INR: 1.1 (ref 0.8–1.2)
Prothrombin Time: 14.5 seconds (ref 11.4–15.2)

## 2021-12-15 LAB — LIPASE, BLOOD: Lipase: 46 U/L (ref 11–51)

## 2021-12-15 MED ORDER — SODIUM CHLORIDE 0.9 % IV BOLUS
500.0000 mL | Freq: Once | INTRAVENOUS | Status: AC
  Administered 2021-12-15: 500 mL via INTRAVENOUS

## 2021-12-15 MED ORDER — MAGNESIUM SULFATE 2 GM/50ML IV SOLN
2.0000 g | Freq: Once | INTRAVENOUS | Status: AC
  Administered 2021-12-15: 2 g via INTRAVENOUS
  Filled 2021-12-15: qty 50

## 2021-12-15 MED ORDER — SODIUM CHLORIDE 0.9 % IV SOLN
1.0000 g | Freq: Once | INTRAVENOUS | Status: AC
  Administered 2021-12-15: 1 g via INTRAVENOUS
  Filled 2021-12-15: qty 10

## 2021-12-15 MED ORDER — SODIUM CHLORIDE 0.9 % IV SOLN: INTRAVENOUS | Status: DC | PRN

## 2021-12-15 MED ORDER — CEFDINIR 300 MG PO CAPS: 300.0000 mg | ORAL_CAPSULE | Freq: Every day | ORAL | 0 refills | Status: AC

## 2021-12-15 NOTE — ED Notes (Signed)
Patient transported to CT 

## 2021-12-15 NOTE — ED Triage Notes (Addendum)
Per pt family,   pt has been more confused x 4 days, fell on Thursday but denies any head injury  States had a cold for the past week Denies fever  Alert and oriented to person, place, disoriented to time H/o Peritoneal Dialysis, last night , h/o TBI Has appointment with neurology tomorrow

## 2021-12-15 NOTE — ED Provider Notes (Signed)
[name]  CSN: [id number] Arrival date & time: 12/15/21 1507  Chief Complaint(s) Altered Mental Status  HPI Chase [name] is a 71 y.o. male with history of diabetes, coronary artery disease, on peritoneal dialysis, hypertension presenting to the emergency department with confusion.  Patient's wife reports increasing confusion since Thursday.  He did fall on Thursday but did not hit his head.  Patient denies headache, nausea, vomiting.  Wife reports that he has had cough and runny nose since last Monday or Tuesday.  Patient reports dysuria.  Denies any abdominal pain, nausea, vomiting, diarrhea.  No productive cough.  Wife reports that he has similar episodes when he has an infection.  Patient has been compliant with his home peritoneal dialysis without complication or difficulty   Past Medical History Past Medical History:  Diagnosis Date   Back pain with radiation    Coronary artery disease    Depression    Diabetes mellitus without complication [facility])    Dialysis patient ([place])    M, W, F   Hypertension    Renal disorder    Thyroid disease    Patient Active Problem List   Diagnosis Date Noted   Acute subdural hematoma ([place]) 10/25/2020   ESRD on peritoneal dialysis ([place]) 10/25/2020   Leukocytosis 10/25/2020   Fall at home, initial encounter 10/25/2020   Abnormal urinalysis 10/25/2020   Chronic pain 10/25/2020   Diabetes mellitus ([place]) 10/25/2020   Dialysis patient ([place]) 07/09/2020   Back pain 07/09/2020   Home Medication(s) Prior to Admission medications   Medication Sig Start Date End Date Taking? Authorizing Provider  cefdinir (OMNICEF) 300 MG capsule Take 1 capsule (300 mg total) by mouth daily for 7 days. 12/15/21 12/22/21 Yes Cristie Hem, MD  amitriptyline (ELAVIL) 10 MG tablet Take 20 mg by mouth at bedtime.    [provider]  buprenorphine (SUBUTEX) 2 MG SUBL SL tablet Place 2 mg under the tongue at  bedtime.    [provider]  busPIRone (BUSPAR) 10 MG tablet Take 10-20 mg by mouth See admin instructions. Take 2 tablets in the AM and 1 tablet in the PM.    [provider]  calcitRIOL (ROCALTROL) 0.25 MCG capsule Take 0.25 mcg by mouth daily.    [provider]  calcium carbonate (TUMS - DOSED IN MG ELEMENTAL CALCIUM) 500 MG chewable tablet Chew 2 tablets by mouth 3 (three) times daily with meals.    [provider]  carvedilol (COREG) 25 MG tablet Take 25 mg by mouth 2 (two) times daily with a meal.    [provider]  cephALEXin (KEFLEX) 500 MG capsule Take 500 mg by mouth See admin instructions. Take as directed by home dialysis nurse.    [provider]  Cyanocobalamin (B-12) 3000 MCG CAPS Take 3,000 mcg by mouth daily.    [provider]  escitalopram (LEXAPRO) 20 MG tablet Take 20 mg by mouth daily.    [provider]  furosemide (LASIX) 20 MG tablet Take 20 mg by mouth 2 (two) times daily.    [provider]  imatinib (GLEEVEC) 400 MG tablet Take 400 mg by mouth daily. Take with meals and large glass of water.Caution:Chemotherapy.    [provider]  insulin glargine (LANTUS) 100 UNIT/ML injection Inject 8 Units into the skin at bedtime.    [provider]  levothyroxine (SYNTHROID) 175 MCG tablet Take 175 mcg by mouth daily before breakfast.    [provider]  melatonin 5 MG TABS Take 5 mg by mouth at bedtime.    [provider]  Multiple Vitamin (MULTIVITAMIN WITH MINERALS) TABS tablet Take 1 tablet by mouth daily.    [provider]  omeprazole (PRILOSEC) 40 MG capsule Take 40 mg by mouth 2 (two) times daily with breakfast and lunch.    [provider]  ondansetron (ZOFRAN) 4 MG tablet Take 4 mg by mouth every 8 (eight) hours as needed for nausea or vomiting.    [provider]  OVER THE COUNTER MEDICATION Take 1 tablet by mouth at bedtime.  Nervive - contains thiamine 1.2mg , Vit B6 1.7mg , Vit B12 2.77mcg, calcium 27g, alpha-lipoic acid 600mg , also turmeric and ginger    [provider]  Probiotic Product (PROBIOTIC PO) Take 1 capsule by mouth daily.    [provider]  rosuvastatin (CRESTOR) 10 MG tablet Take 10 mg by mouth at bedtime.    [provider]  Semaglutide (OZEMPIC, 0.25 OR 0.5 MG/DOSE, [place]) Inject 0.5 mg into the skin every 7 (seven) days. Patient not taking: Reported on 10/25/2020    [provider]  ursodiol (ACTIGALL) 300 MG capsule Take 300 mg by mouth 2 (two) times daily.    [provider]                                                                                                                                    Past Surgical History Past Surgical History:  Procedure Laterality Date   ERCP     Family History History reviewed. No pertinent family history.  Social History Social History   Tobacco Use   Smoking status: Never   Smokeless tobacco: Never  Substance Use Topics   Alcohol use: Never   Drug use: Never   Allergies Gabapentin, Lipitor [atorvastatin], and Pregabalin  Review of Systems Review of Systems  All other systems reviewed and are negative.   Physical Exam Vital Signs  I have reviewed the triage vital signs BP (!) 107/53   Pulse 65   Temp 98.3 F (36.8 C)   Resp 15   Ht 5\' 6"  (1.676 m)   Wt 90.7 kg   SpO2 96%   BMI 32.27 kg/m  Physical Exam Vitals and nursing note reviewed.  Constitutional:      General: He is not in acute distress.    Appearance: Normal appearance.  HENT:     Mouth/Throat:     Mouth: Mucous membranes are moist.  Eyes:     Conjunctiva/sclera: Conjunctivae normal.  Cardiovascular:     Rate and Rhythm: Normal rate and regular rhythm.  Pulmonary:     Effort: Pulmonary effort is normal. No respiratory distress.     Breath sounds: Normal breath sounds.     Comments: Frequent cough Abdominal:      General: Abdomen is flat.     Palpations: Abdomen is soft.  Tenderness: There is abdominal tenderness (mild lower abdominal tenderness).  Musculoskeletal:     Right lower leg: No edema.     Left lower leg: No edema.     Comments: No focal tenderness or deformity, some scattered abrasions on the left hand  Skin:    General: Skin is warm and dry.     Capillary Refill: Capillary refill takes less than 2 seconds.  Neurological:     Mental Status: He is alert and oriented to person, place, and time.     Comments: Slightly confused but fluent speech, moving all extremities equally, cranial nerves grossly intact  Psychiatric:        Mood and Affect: Mood normal.        Behavior: Behavior normal.     ED Results and Treatments Labs (all labs ordered are listed, but only abnormal results are displayed) Labs Reviewed  COMPREHENSIVE METABOLIC PANEL - Abnormal; Notable for the following components:      Result Value   Chloride 96 (*)    Glucose, Bld 230 (*)    BUN 80 (*)    Creatinine, Ser 9.53 (*)    Calcium 8.7 (*)    Albumin 2.5 (*)    Alkaline Phosphatase 151 (*)    GFR, Estimated 5 (*)    All other components within normal limits  CBC WITH DIFFERENTIAL/PLATELET - Abnormal; Notable for the following components:   RBC 2.36 (*)    Hemoglobin 7.2 (*)    HCT 22.8 (*)    RDW 16.7 (*)    Platelets 130 (*)    Neutro Abs 8.5 (*)    All other components within normal limits  MAGNESIUM - Abnormal; Notable for the following components:   Magnesium 1.3 (*)    All other components within normal limits  PHOSPHORUS - Abnormal; Notable for the following components:   Phosphorus 4.7 (*)    All other components within normal limits  URINALYSIS, ROUTINE W REFLEX MICROSCOPIC - Abnormal; Notable for the following components:   Glucose, UA 100 (*)    Hgb urine dipstick MODERATE (*)    Protein, ur 100 (*)    Leukocytes,Ua MODERATE (*)    All other components within normal limits  URINALYSIS,  MICROSCOPIC (REFLEX) - Abnormal; Notable for the following components:   Bacteria, UA FEW (*)    All other components within normal limits  RESP PANEL BY RT-PCR (RSV, FLU A&B, COVID)  RVPGX2  URINE CULTURE  LIPASE, BLOOD  LACTIC ACID, PLASMA  PROTIME-INR  AMMONIA  TSH                                                                                                                          Radiology CT Head Wo Contrast  Result Date: 12/15/2021 CLINICAL DATA:  Increased confusion, recent fall EXAM: CT HEAD WITHOUT CONTRAST TECHNIQUE: Contiguous axial images were obtained from the base of the skull through the vertex without intravenous contrast. RADIATION DOSE REDUCTION: This exam  was performed according to the departmental dose-optimization program which includes automated exposure control, adjustment of the mA and/or kV according to patient size and/or use of iterative reconstruction technique. COMPARISON:  10/21/2021 FINDINGS: Brain: No evidence of acute infarction, hemorrhage, hydrocephalus, extra-axial collection or mass lesion/mass effect. Periventricular and deep white matter hypodensity. Mild global cerebral volume loss. Vascular: No hyperdense vessel or unexpected calcification. Skull: Normal. Negative for fracture or focal lesion. Sinuses/Orbits: No acute finding. Other: None. IMPRESSION: No acute intracranial pathology. Small-vessel white matter disease and global cerebral volume loss. Electronically Signed   By: Delanna Ahmadi M.D.   On: 12/15/2021 16:59   CT ABDOMEN PELVIS WO CONTRAST  Result Date: 12/15/2021 CLINICAL DATA:  Abdominal pain, increased confusion for 4 days recent fall EXAM: CT ABDOMEN AND PELVIS WITHOUT CONTRAST TECHNIQUE: Multidetector CT imaging of the abdomen and pelvis was performed following the standard protocol without IV contrast. RADIATION DOSE REDUCTION: This exam was performed according to the departmental dose-optimization program which includes automated  exposure control, adjustment of the mA and/or kV according to patient size and/or use of iterative reconstruction technique. COMPARISON:  10/21/2021 FINDINGS: Lower chest: Moderate left pleural effusion associated atelectasis or consolidation, similar to prior examination. Coronary artery calcifications. Hepatobiliary: No focal liver abnormality is seen. Status post cholecystectomy. Unchanged postoperative biliary ductal dilatation and pneumobilia. Pancreas: Unremarkable. No pancreatic ductal dilatation or surrounding inflammatory changes. Spleen: Normal in size without significant abnormality. Adrenals/Urinary Tract: Adrenal glands are unremarkable. Unchanged intrinsically hyperdense hemorrhagic or proteinaceous cyst of the superior pole of the right kidney (series 2, image 35). Kidneys status post left nephrectomy. Mild thickening of the urinary bladder. Stomach/Bowel: Stomach is within normal limits. Mid small bowel resection and reanastomosis. Appendix appears normal. No evidence of bowel wall thickening, distention, or inflammatory changes. Vascular/Lymphatic: Aortic atherosclerosis. No enlarged abdominal or pelvic lymph nodes. Reproductive: No mass or other significant abnormality. Other: No abdominal wall hernia or abnormality. Tenckhoff type peritoneal dialysis catheter positioned in the left lower quadrant (series 2, image 64). Minimal pneumoperitoneum, similar to prior examination, presumably secondary to catheter access. No significant dialysate in the abdomen or pelvis. Musculoskeletal: No acute or significant osseous findings. Unchanged mild superior endplate deformity of the L2 vertebral body. Disc degenerative disease and ankylosis throughout the thoracic and upper lumbar spine, in keeping with DISH. IMPRESSION: 1. No acute noncontrast CT findings of the abdomen or pelvis to explain abdominal pain. 2. Tenckhoff type peritoneal dialysis catheter positioned in the left lower quadrant. Minimal  pneumoperitoneum, similar to prior examination, presumably secondary to catheter access. No significant dialysate in the abdomen or pelvis. 3. Mild thickening of the urinary bladder, suggestive of cystitis. Correlate with urinalysis. 4. Moderate left pleural effusion and associated atelectasis or consolidation, similar to prior examination. 5. Status post left nephrectomy. 6. Coronary artery disease. Aortic Atherosclerosis (ICD10-I70.0). Electronically Signed   By: Delanna Ahmadi M.D.   On: 12/15/2021 16:54   DG Hand Complete Left  Result Date: 12/15/2021 CLINICAL DATA:  [place] 4 days ago.  Left hand pain. EXAM: LEFT HAND - COMPLETE 3+ VIEW COMPARISON:  None Available. FINDINGS: No fracture or bone lesion. Joints are normally aligned. Arterial vascular calcifications. Soft tissues otherwise unremarkable. IMPRESSION: No fracture or dislocation. Electronically Signed   By: Lajean Manes M.D.   On: 12/15/2021 16:51   DG Chest Port 1 View  Result Date: 12/15/2021 CLINICAL DATA:  Cough. More confusion over the last 4 days. Fell 4 days ago. EXAM: PORTABLE CHEST 1 VIEW COMPARISON:  05/06/2021  and 01/29/2021.  CT, 10/21/2021. FINDINGS: Cardiac silhouette is normal in size. No mediastinal or hilar masses. Left lung base opacity similar to the prior exams, consistent with a combination of pleural fluid and atelectasis. Pneumonia not excluded. Remainder of the lungs is clear. No convincing right pleural effusion. No pneumothorax. Skeletal structures are grossly intact. Stable spine stimulator leads. IMPRESSION: 1. Left lung base opacity, which is similar to the prior radiographs and previous chest CT, consistent with a combination of pleural fluid and atelectasis. Pneumonia not excluded. Electronically Signed   By: Lajean Manes M.D.   On: 12/15/2021 16:50    Pertinent labs & imaging results that were available during my care of the patient were reviewed by me and considered in my medical decision making (see MDM for  details).  Medications Ordered in ED Medications  cefTRIAXone (ROCEPHIN) 1 g in sodium chloride 0.9 % 100 mL IVPB (has no administration in time range)  magnesium sulfate IVPB 2 g 50 mL (has no administration in time range)  0.9 %  sodium chloride infusion (has no administration in time range)  sodium chloride 0.9 % bolus 500 mL (0 mLs Intravenous Stopped 12/15/21 1819)                                                                                                                                     Procedures .1-3 Lead EKG Interpretation  Performed by: Cristie Hem, MD Authorized by: Cristie Hem, MD     Interpretation: normal     ECG rate:  68   ECG rate assessment: normal     Rhythm: sinus rhythm     Ectopy: none     Conduction: normal     (including critical care time)  Medical Decision Making / ED Course   MDM:  71 year old male presenting to the emergency department with mild confusion.  Patient overall well-appearing.  Frequently coughing in the room.  He does have some mild lower abdominal tenderness on exam.  Differential includes occult infectious process such as a pneumonia or intra-abdominal infection.  Will obtain CT scan and chest x-ray.  Also includes intracranial process such as bleed, patient has had prior intracranial bleeds, will obtain CT head.  Patient did fall recently and landed on his left hand, has some abrasions to his hand, but no focal tenderness will obtain x-ray.  Differential also includes toxic or metabolic abnormality such as uremia or hyperammonemia, will obtain metabolic workup.  Vital signs are reassuring.  No sign of focal neurologic deficit to suggest stroke.  Will reassess.  Clinical Course as of 12/15/21 2119  Nancy Fetter Dec 15, 2021  1909 Lab results with anemia, slightly decreased from 8.0 on 12/4.  Patient and wife deny any interim black stools, hematemesis, hematochezia, hemoptysis.  Suspect related to chronic renal failure.   Does not meet transfusion threshold.  CT head and CT abdomen without acute process other than  possible cystitis given bladder wall thickening.  Patient does endorse dysuria.  Urinalysis somewhat concerning for urine infection.  Will send urine culture.  Will give a dose of ceftriaxone in the emergency department and discharged with cefdinir.  Although patient has cough, chest x-ray and CT scan with chronic left lower lobe opacity consistent with known pleural effusion and atelectasis, less likely a pneumonia.  Due to limitations of laboratory at this facility ammonia and TSH unable to be performed but symptoms less consistent with hypothyroidism and no LFT abnormalities to suggest liver disease leading to hyperammonemia.  His BUN is also somewhat more elevated than previous but has been at this level in the past, lower concern at this is the cause of his mild encephalopathy.  Discussed results with patient and wife who feel comfortable with discharge and close monitoring with strict return precautions if his symptoms worsen or he develops any fevers or other focal symptoms. Will discharge patient to home. All questions answered. Patient comfortable with plan of discharge. Return precautions discussed with patient and specified on the after visit summary.  [WS]    Clinical Course User Index [WS] Cristie Hem, MD     Additional history obtained: -Additional history obtained from spouse -External records from outside source obtained and reviewed including: Chart review including previous notes, labs, imaging, consultation notes including nephrology labs 12/4   Lab Tests: -I ordered, reviewed, and interpreted labs.   The pertinent results include:   Labs Reviewed  COMPREHENSIVE METABOLIC PANEL - Abnormal; Notable for the following components:      Result Value   Chloride 96 (*)    Glucose, Bld 230 (*)    BUN 80 (*)    Creatinine, Ser 9.53 (*)    Calcium 8.7 (*)    Albumin 2.5 (*)     Alkaline Phosphatase 151 (*)    GFR, Estimated 5 (*)    All other components within normal limits  CBC WITH DIFFERENTIAL/PLATELET - Abnormal; Notable for the following components:   RBC 2.36 (*)    Hemoglobin 7.2 (*)    HCT 22.8 (*)    RDW 16.7 (*)    Platelets 130 (*)    Neutro Abs 8.5 (*)    All other components within normal limits  MAGNESIUM - Abnormal; Notable for the following components:   Magnesium 1.3 (*)    All other components within normal limits  PHOSPHORUS - Abnormal; Notable for the following components:   Phosphorus 4.7 (*)    All other components within normal limits  URINALYSIS, ROUTINE W REFLEX MICROSCOPIC - Abnormal; Notable for the following components:   Glucose, UA 100 (*)    Hgb urine dipstick MODERATE (*)    Protein, ur 100 (*)    Leukocytes,Ua MODERATE (*)    All other components within normal limits  URINALYSIS, MICROSCOPIC (REFLEX) - Abnormal; Notable for the following components:   Bacteria, UA FEW (*)    All other components within normal limits  RESP PANEL BY RT-PCR (RSV, FLU A&B, COVID)  RVPGX2  URINE CULTURE  LIPASE, BLOOD  LACTIC ACID, PLASMA  PROTIME-INR  AMMONIA  TSH    Notable for elevated BUN, signs of UTI, normal lactate, chronic anemia, mild hypomagnesemia  EKG   EKG Interpretation  Date/Time:  Sunday December 15 2021 15:17:24 EST Ventricular Rate:  73 PR Interval:  176 QRS Duration: 105 QT Interval:  404 QTC Calculation: 446 R Axis:   -7 Text Interpretation: Sinus rhythm Confirmed by Garnette Gunner [id number])  on 12/15/2021 3:42:49 PM         Imaging Studies ordered: I ordered imaging studies including CT head, CT abdomen pelvis, chest x-ray, left hand x-ray On my interpretation imaging demonstrates no acute intracranial process.  Bladder wall thickening, chronic left lower lobe atelectasis and pleural effusion I independently visualized and interpreted imaging. I agree with the radiologist interpretation   Medicines  ordered and prescription drug management: Meds ordered this encounter  Medications   sodium chloride 0.9 % bolus 500 mL   cefTRIAXone (ROCEPHIN) 1 g in sodium chloride 0.9 % 100 mL IVPB    Order Specific Question:   Antibiotic Indication:    Answer:   UTI   magnesium sulfate IVPB 2 g 50 mL   0.9 %  sodium chloride infusion    Carrier Fluid Protocol   cefdinir (OMNICEF) 300 MG capsule    Sig: Take 1 capsule (300 mg total) by mouth daily for 7 days.    Dispense:  7 capsule    Refill:  0    -I have reviewed the patients home medicines and have made adjustments as needed  Cardiac Monitoring: The patient was maintained on a cardiac monitor.  I personally viewed and interpreted the cardiac monitored which showed an underlying rhythm of: Normal sinus rhythm  Social Determinants of Health:  Diagnosis or treatment significantly limited by social determinants of health: obesity   Reevaluation: After the interventions noted above, I reevaluated the patient and found that they have improved  Co morbidities that complicate the patient evaluation  Past Medical History:  Diagnosis Date   Back pain with radiation    Coronary artery disease    Depression    Diabetes mellitus without complication ([place])    Dialysis patient ([place])    M, W, F   Hypertension    Renal disorder    Thyroid disease       Dispostion: Disposition decision including need for hospitalization was considered, and patient discharged from emergency department.    Final Clinical Impression(s) / ED Diagnoses Final diagnoses:  Acute cystitis without hematuria  Confusion     This chart was dictated using voice recognition software.  Despite best efforts to proofread,  errors can occur which can change the documentation meaning.    Cristie Hem, MD 12/15/21 [phone]

## 2021-12-15 NOTE — Discharge Instructions (Addendum)
We evaluated you for your confusion.  Your laboratory tests and CT scan showed that you have a mild urinary infection.  This can cause confusion.  We gave you a dose of antibiotics in the emergency department and have sent your prescription for the rest of your treatment.  If your symptoms worsen, or you develop any fevers, abdominal pain, flank pain, vomiting, or any other concerning symptoms, please return immediately to the emergency department.  Please follow-up with your primary doctor in around 1 week to make sure that your symptoms are improving.

## 2021-12-17 LAB — URINE CULTURE: Culture: NO GROWTH

## 2022-02-17 DIAGNOSIS — Z87828 Personal history of other (healed) physical injury and trauma: Secondary | ICD-10-CM | POA: Insufficient documentation

## 2022-06-04 ENCOUNTER — Emergency Department (HOSPITAL_COMMUNITY)
Admission: EM | Admit: 2022-06-04 | Discharge: 2022-06-04 | Disposition: A | Discharge status: Home / Self Care | Payer: Medicare Other | Attending: Emergency Medicine | Admitting: Emergency Medicine

## 2022-06-04 ENCOUNTER — Emergency Department (HOSPITAL_COMMUNITY): Payer: Medicare Other

## 2022-06-04 ENCOUNTER — Other Ambulatory Visit: Payer: Self-pay

## 2022-06-04 ENCOUNTER — Emergency Department (HOSPITAL_BASED_OUTPATIENT_CLINIC_OR_DEPARTMENT_OTHER): Payer: Medicare Other

## 2022-06-04 ENCOUNTER — Encounter (HOSPITAL_COMMUNITY): Payer: Self-pay

## 2022-06-04 DIAGNOSIS — L818 Other specified disorders of pigmentation: Secondary | ICD-10-CM | POA: Insufficient documentation

## 2022-06-04 DIAGNOSIS — M79641 Pain in right hand: Secondary | ICD-10-CM

## 2022-06-04 DIAGNOSIS — R52 Pain, unspecified: Secondary | ICD-10-CM

## 2022-06-04 DIAGNOSIS — Z794 Long term (current) use of insulin: Secondary | ICD-10-CM | POA: Diagnosis not present

## 2022-06-04 HISTORY — DX: Personal history of other (healed) physical injury and trauma: Z87.828

## 2022-06-04 LAB — CBC WITH DIFFERENTIAL/PLATELET
Abs Immature Granulocytes: 0.03 10*3/uL (ref 0.00–0.07)
Basophils Absolute: 0.1 10*3/uL (ref 0.0–0.1)
Basophils Relative: 1 %
Eosinophils Absolute: 0.4 10*3/uL (ref 0.0–0.5)
Eosinophils Relative: 5 %
HCT: 25.7 % — ABNORMAL LOW (ref 39.0–52.0)
Hemoglobin: 7.7 g/dL — ABNORMAL LOW (ref 13.0–17.0)
Immature Granulocytes: 0 %
Lymphocytes Relative: 12 %
Lymphs Abs: 1.1 10*3/uL (ref 0.7–4.0)
MCH: 30.4 pg (ref 26.0–34.0)
MCHC: 30 g/dL (ref 30.0–36.0)
MCV: 101.6 fL — ABNORMAL HIGH (ref 80.0–100.0)
Monocytes Absolute: 0.6 10*3/uL (ref 0.1–1.0)
Monocytes Relative: 7 %
Neutro Abs: 6.6 10*3/uL (ref 1.7–7.7)
Neutrophils Relative %: 75 %
Platelets: 148 10*3/uL — ABNORMAL LOW (ref 150–400)
RBC: 2.53 MIL/uL — ABNORMAL LOW (ref 4.22–5.81)
RDW: 16.9 % — ABNORMAL HIGH (ref 11.5–15.5)
WBC: 8.9 10*3/uL (ref 4.0–10.5)
nRBC: 0 % (ref 0.0–0.2)

## 2022-06-04 LAB — BASIC METABOLIC PANEL
Anion gap: 10 (ref 5–15)
BUN: 33 mg/dL — ABNORMAL HIGH (ref 8–23)
CO2: 27 mmol/L (ref 22–32)
Calcium: 8.7 mg/dL — ABNORMAL LOW (ref 8.9–10.3)
Chloride: 99 mmol/L (ref 98–111)
Creatinine, Ser: 4.01 mg/dL — ABNORMAL HIGH (ref 0.61–1.24)
GFR, Estimated: 15 mL/min — ABNORMAL LOW (ref >60)
Glucose, Bld: 116 mg/dL — ABNORMAL HIGH (ref 70–99)
Potassium: 4.5 mmol/L (ref 3.5–5.1)
Sodium: 136 mmol/L (ref 135–145)

## 2022-06-04 MED ORDER — ACETAMINOPHEN 500 MG PO TABS
1000.0000 mg | ORAL_TABLET | Freq: Once | ORAL | Status: AC
  Administered 2022-06-04: 1000 mg via ORAL
  Filled 2022-06-04: qty 2

## 2022-06-04 NOTE — ED Provider Notes (Signed)
[place] EMERGENCY DEPARTMENT AT [facility] ([facility]) Provider Note   CSN: [id number] Arrival date & time: 06/04/22  1250     History  Chief Complaint  Patient presents with   Hand Pain    Larmar Streich is a 72 y.o. male.  72 year old male with prior medical history as detailed below presents for evaluation.  Patient is a resident at [facility].  Patient is on hemodialysis 3 times a week.  Patient is complaining of painful discoloration to right fingertips  - Specifically the fingertips of the right ring and index fingers.   Patient reports that he has had frequent and aggressive blood glucose fingersticks to these fingers for several weeks.  He has pain to the area.  He denies discoloration or pain to the rest of the right upper extremity.  He denies associated symptoms such as fever, chest pain, shortness of breath, other complaint.  The history is provided by the patient and medical records.       Home Medications Prior to Admission medications   Medication Sig Start Date End Date Taking? Authorizing Provider  amitriptyline (ELAVIL) 10 MG tablet Take 20 mg by mouth at bedtime.    [provider]  buprenorphine (SUBUTEX) 2 MG SUBL SL tablet Place 2 mg under the tongue at bedtime.    [provider]  busPIRone (BUSPAR) 10 MG tablet Take 10-20 mg by mouth See admin instructions. Take 2 tablets in the AM and 1 tablet in the PM.    [provider]  calcitRIOL (ROCALTROL) 0.25 MCG capsule Take 0.25 mcg by mouth daily.    [provider]  calcium carbonate (TUMS - DOSED IN MG ELEMENTAL CALCIUM) 500 MG chewable tablet Chew 2 tablets by mouth 3 (three) times daily with meals.    [provider]  carvedilol (COREG) 25 MG tablet Take 25 mg by mouth 2 (two) times daily with a meal.    [provider]  cephALEXin (KEFLEX) 500 MG capsule Take 500 mg by mouth See admin instructions. Take as directed by home dialysis nurse.     [provider]  Cyanocobalamin (B-12) 3000 MCG CAPS Take 3,000 mcg by mouth daily.    [provider]  escitalopram (LEXAPRO) 20 MG tablet Take 20 mg by mouth daily.    [provider]  furosemide (LASIX) 20 MG tablet Take 20 mg by mouth 2 (two) times daily.    [provider]  imatinib (GLEEVEC) 400 MG tablet Take 400 mg by mouth daily. Take with meals and large glass of water.Caution:Chemotherapy.    [provider]  insulin glargine (LANTUS) 100 UNIT/ML injection Inject 8 Units into the skin at bedtime.    [provider]  levothyroxine (SYNTHROID) 175 MCG tablet Take 175 mcg by mouth daily before breakfast.    [provider]  melatonin 5 MG TABS Take 5 mg by mouth at bedtime.    [provider]  Multiple Vitamin (MULTIVITAMIN WITH MINERALS) TABS tablet Take 1 tablet by mouth daily.    [provider]  omeprazole (PRILOSEC) 40 MG capsule Take 40 mg by mouth 2 (two) times daily with breakfast and lunch.    [provider]  ondansetron (ZOFRAN) 4 MG tablet Take 4 mg by mouth every 8 (eight) hours as needed for nausea or vomiting.    [provider]  OVER THE COUNTER MEDICATION Take 1 tablet by mouth at bedtime. Nervive - contains thiamine 1.2mg , Vit B6 1.7mg , Vit B12 2.56mcg,  calcium 27g, alpha-lipoic acid 600mg , also turmeric and ginger    [provider]  Probiotic Product (PROBIOTIC PO) Take 1 capsule by mouth daily.    [provider]  rosuvastatin (CRESTOR) 10 MG tablet Take 10 mg by mouth at bedtime.    [provider]  Semaglutide (OZEMPIC, 0.25 OR 0.5 MG/DOSE, ) Inject 0.5 mg into the skin every 7 (seven) days. Patient not taking: Reported on 10/25/2020    [provider]  ursodiol (ACTIGALL) 300 MG capsule Take 300 mg by mouth 2 (two) times daily.    [provider]      Allergies    Gabapentin, Lipitor [atorvastatin], and Pregabalin     Review of Systems   Review of Systems  All other systems reviewed and are negative.   Physical Exam Updated Vital Signs BP (!) 126/105   Pulse 62   Temp 98 F (36.7 C) (Oral)   Resp 16   SpO2 91%  Physical Exam Vitals and nursing note reviewed.  Constitutional:      General: He is not in acute distress.    Appearance: Normal appearance. He is well-developed.  HENT:     Head: Normocephalic and atraumatic.  Eyes:     Conjunctiva/sclera: Conjunctivae normal.     Pupils: Pupils are equal, round, and reactive to light.  Cardiovascular:     Rate and Rhythm: Normal rate and regular rhythm.     Heart sounds: Normal heart sounds.  Pulmonary:     Effort: Pulmonary effort is normal. No respiratory distress.     Breath sounds: Normal breath sounds.  Abdominal:     General: There is no distension.     Palpations: Abdomen is soft.     Tenderness: There is no abdominal tenderness.  Musculoskeletal:        General: No deformity. Normal range of motion.     Cervical back: Normal range of motion and neck supple.  Skin:    General: Skin is warm and dry.     Comments: Bruising and discoloration to the distal aspects of the right ring and index fingers.  This is consistent with patient's reported history of frequent and aggressive Accu-Cheks to these fingers for the last several weeks.  See images below.  Patient with 2+ radial pulse.  Patient without objective evidence of significant arterial ischemia on exam.  Neurological:     General: No focal deficit present.     Mental Status: He is alert and oriented to person, place, and time.        ED Results / Procedures / Treatments   Labs (all labs ordered are listed, but only abnormal results are displayed) Labs Reviewed  CBC WITH DIFFERENTIAL/PLATELET - Abnormal; Notable for the following components:      Result Value   RBC 2.53 (*)    Hemoglobin 7.7 (*)    HCT 25.7 (*)    MCV 101.6 (*)    RDW 16.9 (*)    Platelets 148 (*)     All other components within normal limits  BASIC METABOLIC PANEL - Abnormal; Notable for the following components:   Glucose, Bld 116 (*)    BUN 33 (*)    Creatinine, Ser 4.01 (*)    Calcium 8.7 (*)    GFR, Estimated 15 (*)    All other components within normal limits    EKG None  Radiology VAS [place] UPPER EXTREMITY ARTERIAL DUPLEX  Result Date: 06/04/2022  UPPER EXTREMITY DUPLEX STUDY Patient Name:  Mavric Copado  Date of Exam:   06/04/2022 Medical Rec #: [id number]      Accession #:    [id number] Date of Birth: Mar 22, 1950      Patient Gender: M Patient Age:   89 years Exam Location:  [facility] Procedure:      VAS [place] UPPER EXTREMITY ARTERIAL DUPLEX Referring Phys: Theron Arista Silver Achey --------------------------------------------------------------------------------  Indications: Bruising and pain of RUE digits. History:     Patient has a history of Pain in fingertips (patient gets blood              glucose check in right hand).  Risk Factors:  Hypertension, Diabetes, no history of smoking. Other Factors: ESRD (HD LUE) Limitations: Vessel calcification, patient movement, irregular heart rhythm Comparison Study: No previous exams Performing Technologist: Jody Hill RVT, RDMS  Examination Guidelines: A complete evaluation includes B-mode imaging, spectral Doppler, color Doppler, and power Doppler as needed of all accessible portions of each vessel. Bilateral testing is considered an integral part of a complete examination. Limited examinations for reoccurring indications may be performed as noted.  Right Doppler Findings: +---------------+----------+----------+-------------+--------+ Site           PSV (cm/s)Waveform  Stenosis     Comments +---------------+----------+----------+-------------+--------+ Subclavian Prox103       triphasic                       +---------------+----------+----------+-------------+--------+ Subclavian Mid 80        triphasic                        +---------------+----------+----------+-------------+--------+ Subclavian Dist          monophasic                      +---------------+----------+----------+-------------+--------+ Axillary       74        triphasic                       +---------------+----------+----------+-------------+--------+ Brachial Prox  55        triphasic                       +---------------+----------+----------+-------------+--------+ Brachial Mid   126       triphasic                       +---------------+----------+----------+-------------+--------+ Brachial Dist  69        triphasic                       +---------------+----------+----------+-------------+--------+ Radial Prox    149       biphasic  <50% stenosis         +---------------+----------+----------+-------------+--------+ Radial Mid     85        monophasic                      +---------------+----------+----------+-------------+--------+ Radial Dist    32        monophasic                      +---------------+----------+----------+-------------+--------+ Ulnar Prox     74        triphasic                       +---------------+----------+----------+-------------+--------+ Ulnar Mid  129       triphasic <50% stenosis         +---------------+----------+----------+-------------+--------+ Ulnar Dist     24        monophasic                      +---------------+----------+----------+-------------+--------+ Palmar Arch    59        biphasic                        +---------------+----------+----------+-------------+--------+ Arterial system patent, ther is a <50% stenosis in the ulnar and radial arteries.   Summary:  Right: <50% stenosis visualized in the right upper extremity. *See table(s) above for measurements and observations.    Preliminary    DG Hand Complete Right  Result Date: 06/04/2022 CLINICAL DATA:  Pain and swelling EXAM: RIGHT HAND - COMPLETE 3+ VIEW COMPARISON:  None  Available. FINDINGS: No fracture or dislocation is seen. Degenerative changes are noted with bony spurs in multiple interphalangeal joints. Degenerative changes are noted in first carpometacarpal and first metacarpophalangeal joints. No focal lytic lesions are seen. Extensive arterial calcifications are noted extending to the digital arterial branches in the fingers. IMPRESSION: No fracture or dislocation is seen. No focal lytic lesions are noted. Degenerative changes are noted in multiple joints. Extensive arterial calcifications are noted. Electronically Signed   By: Ernie Avena M.D.   On: 06/04/2022 13:52    Procedures Procedures    Medications Ordered in ED Medications  acetaminophen (TYLENOL) tablet 1,000 mg (1,000 mg Oral Given 06/04/22 1341)    ED Course/ Medical Decision Making/ A&P Clinical Course as of 06/04/22 1602  Wed Jun 04, 2022  1511 VAS [place] UPPER EXTREMITY ARTERIAL DUPLEX [PM]  1522 VAS [place] UPPER EXTREMITY ARTERIAL DUPLEX [PM]    Clinical Course User Index [PM] Wynetta Fines, MD                             Medical Decision Making Amount and/or Complexity of Data Reviewed Labs: ordered. Radiology: ordered. Decision-making details documented in ED Course.  Risk OTC drugs.    Medical Screen Complete  This patient presented to the ED with complaint of right hand, fingertip pain of the right ring and right index.  This complaint involves an extensive number of treatment options. The initial differential diagnosis includes, but is not limited to, direct trauma related to Accu-Chek, ischemia, metabolic abnormality  This presentation is: Acute, Chronic, Self-Limited, Previously Undiagnosed, Uncertain Prognosis, Complicated, Systemic Symptoms, and Threat to Life/Bodily Function  Patient is presenting with complaint of painful fingertips on the right hand -specifically the right index and right ring fingers.  These fingers have been frequently subjected to  multiple Accu-Cheks per report.  Patient is on dialysis.  Patient is receiving heparin 3 times daily for same.  Patient with intact cap refill and other fingers of the right hand.  Patient with 2+ radial pulse on the right hand.  Arterial Doppler imaging is without evidence of significant stenosis or impaired blood flow to the right palmar arch.  Labs obtained are otherwise without significant abnormality.  Patient's complaint is most likely related to excessive Accu-Cheks to the same fingertips over and over again.  Patient is appropriate discharge home.  Importance of close follow-up stressed.  Strict return precautions given and understood.  Additional history obtained: External records from outside sources obtained and reviewed including prior ED  visits and prior Inpatient records.    Lab Tests:  I ordered and personally interpreted labs.     Imaging Studies ordered:  I ordered imaging studies including vascular ultrasound of the upper extremity evaluate arterial flow  I agree with the radiologist interpretation.   Cardiac Monitoring:  The patient was maintained on a cardiac monitor.  I personally viewed and interpreted the cardiac monitor which showed an underlying rhythm of: NSR   Problem List / ED Course:  Right hand pain   Reevaluation:  After the interventions noted above, I reevaluated the patient and found that they have: improved  Disposition:  After consideration of the diagnostic results and the patients response to treatment, I feel that the patent would benefit from close outpatient follow-up.          Final Clinical Impression(s) / ED Diagnoses Final diagnoses:  Right hand pain    Rx / DC Orders ED Discharge Orders     None         Wynetta Fines, MD 06/04/22 [phone]

## 2022-06-04 NOTE — ED Notes (Signed)
[facility] notified for pt transport 

## 2022-06-04 NOTE — Progress Notes (Signed)
RUE arterial duplex has been completed.  Preliminary results given to Dr. Rodena Medin.   Results can be found under chart review under CV PROC. 06/04/2022 4:25 PM Jackquelyn Sundberg RVT, RDMS

## 2022-06-04 NOTE — Discharge Instructions (Addendum)
Return for any problem.  Do not have blood glucose checks performed on right ring or index finger.  These fingers need to be allowed to heal after too frequent blood glucose checks.

## 2022-06-04 NOTE — ED Triage Notes (Signed)
Pt BIBA from [facility] for possible arterial blockage in right arm per staff. Pt c/o pain to right side fingers.  130/70 BP 60 HR 107 cbg 94% room air

## 2022-06-23 ENCOUNTER — Encounter (HOSPITAL_COMMUNITY): Payer: Self-pay

## 2022-06-23 ENCOUNTER — Other Ambulatory Visit: Payer: Self-pay

## 2022-06-23 ENCOUNTER — Inpatient Hospital Stay (HOSPITAL_COMMUNITY)
Admission: EM | Admit: 2022-06-23 | Discharge: 2022-07-02 | DRG: 252 | Disposition: A | Discharge status: Skilled Nursing Facility | Payer: Medicare Other | Source: Skilled Nursing Facility | Attending: Internal Medicine | Admitting: Internal Medicine

## 2022-06-23 DIAGNOSIS — F419 Anxiety disorder, unspecified: Secondary | ICD-10-CM | POA: Diagnosis present

## 2022-06-23 DIAGNOSIS — E1122 Type 2 diabetes mellitus with diabetic chronic kidney disease: Secondary | ICD-10-CM | POA: Diagnosis present

## 2022-06-23 DIAGNOSIS — E039 Hypothyroidism, unspecified: Secondary | ICD-10-CM | POA: Diagnosis not present

## 2022-06-23 DIAGNOSIS — I96 Gangrene, not elsewhere classified: Secondary | ICD-10-CM

## 2022-06-23 DIAGNOSIS — M898X9 Other specified disorders of bone, unspecified site: Secondary | ICD-10-CM | POA: Diagnosis present

## 2022-06-23 DIAGNOSIS — G8929 Other chronic pain: Secondary | ICD-10-CM | POA: Diagnosis present

## 2022-06-23 DIAGNOSIS — Z79891 Long term (current) use of opiate analgesic: Secondary | ICD-10-CM

## 2022-06-23 DIAGNOSIS — D638 Anemia in other chronic diseases classified elsewhere: Secondary | ICD-10-CM | POA: Diagnosis not present

## 2022-06-23 DIAGNOSIS — M79641 Pain in right hand: Principal | ICD-10-CM

## 2022-06-23 DIAGNOSIS — E669 Obesity, unspecified: Secondary | ICD-10-CM | POA: Diagnosis not present

## 2022-06-23 DIAGNOSIS — M542 Cervicalgia: Secondary | ICD-10-CM | POA: Diagnosis present

## 2022-06-23 DIAGNOSIS — I120 Hypertensive chronic kidney disease with stage 5 chronic kidney disease or end stage renal disease: Secondary | ICD-10-CM | POA: Diagnosis present

## 2022-06-23 DIAGNOSIS — E1152 Type 2 diabetes mellitus with diabetic peripheral angiopathy with gangrene: Secondary | ICD-10-CM | POA: Diagnosis not present

## 2022-06-23 DIAGNOSIS — I9589 Other hypotension: Secondary | ICD-10-CM | POA: Diagnosis not present

## 2022-06-23 DIAGNOSIS — F32A Depression, unspecified: Secondary | ICD-10-CM

## 2022-06-23 DIAGNOSIS — D539 Nutritional anemia, unspecified: Secondary | ICD-10-CM | POA: Diagnosis present

## 2022-06-23 DIAGNOSIS — Z79890 Hormone replacement therapy (postmenopausal): Secondary | ICD-10-CM

## 2022-06-23 DIAGNOSIS — I10 Essential (primary) hypertension: Secondary | ICD-10-CM

## 2022-06-23 DIAGNOSIS — N186 End stage renal disease: Secondary | ICD-10-CM

## 2022-06-23 DIAGNOSIS — K219 Gastro-esophageal reflux disease without esophagitis: Secondary | ICD-10-CM

## 2022-06-23 DIAGNOSIS — D631 Anemia in chronic kidney disease: Secondary | ICD-10-CM | POA: Diagnosis not present

## 2022-06-23 DIAGNOSIS — Z6833 Body mass index (BMI) 33.0-33.9, adult: Secondary | ICD-10-CM

## 2022-06-23 DIAGNOSIS — Z794 Long term (current) use of insulin: Secondary | ICD-10-CM | POA: Diagnosis not present

## 2022-06-23 DIAGNOSIS — I70268 Atherosclerosis of native arteries of extremities with gangrene, other extremity: Secondary | ICD-10-CM | POA: Diagnosis present

## 2022-06-23 DIAGNOSIS — Z992 Dependence on renal dialysis: Secondary | ICD-10-CM | POA: Diagnosis not present

## 2022-06-23 DIAGNOSIS — Z7984 Long term (current) use of oral hypoglycemic drugs: Secondary | ICD-10-CM

## 2022-06-23 DIAGNOSIS — Z888 Allergy status to other drugs, medicaments and biological substances status: Secondary | ICD-10-CM

## 2022-06-23 DIAGNOSIS — I70208 Unspecified atherosclerosis of native arteries of extremities, other extremity: Secondary | ICD-10-CM | POA: Diagnosis present

## 2022-06-23 DIAGNOSIS — E8809 Other disorders of plasma-protein metabolism, not elsewhere classified: Secondary | ICD-10-CM | POA: Diagnosis present

## 2022-06-23 DIAGNOSIS — Z79899 Other long term (current) drug therapy: Secondary | ICD-10-CM

## 2022-06-23 DIAGNOSIS — I2510 Atherosclerotic heart disease of native coronary artery without angina pectoris: Secondary | ICD-10-CM | POA: Diagnosis not present

## 2022-06-23 DIAGNOSIS — M549 Dorsalgia, unspecified: Secondary | ICD-10-CM | POA: Diagnosis present

## 2022-06-23 DIAGNOSIS — E785 Hyperlipidemia, unspecified: Secondary | ICD-10-CM | POA: Diagnosis present

## 2022-06-23 DIAGNOSIS — I998 Other disorder of circulatory system: Secondary | ICD-10-CM

## 2022-06-23 DIAGNOSIS — Z713 Dietary counseling and surveillance: Secondary | ICD-10-CM

## 2022-06-23 DIAGNOSIS — E871 Hypo-osmolality and hyponatremia: Secondary | ICD-10-CM

## 2022-06-23 DIAGNOSIS — Z87820 Personal history of traumatic brain injury: Secondary | ICD-10-CM

## 2022-06-23 LAB — LACTIC ACID, PLASMA
Lactic Acid, Venous: 1.1 mmol/L (ref 0.5–1.9)
Lactic Acid, Venous: 0.8 mmol/L (ref 0.5–1.9)

## 2022-06-23 LAB — COMPREHENSIVE METABOLIC PANEL
ALT: 12 U/L (ref 0–44)
AST: 14 U/L — ABNORMAL LOW (ref 15–41)
Albumin: 2.5 g/dL — ABNORMAL LOW (ref 3.5–5.0)
Alkaline Phosphatase: 138 U/L — ABNORMAL HIGH (ref 38–126)
Anion gap: 11 (ref 5–15)
BUN: 56 mg/dL — ABNORMAL HIGH (ref 8–23)
CO2: 26 mmol/L (ref 22–32)
Calcium: 8.6 mg/dL — ABNORMAL LOW (ref 8.9–10.3)
Chloride: 97 mmol/L — ABNORMAL LOW (ref 98–111)
Creatinine, Ser: 4.99 mg/dL — ABNORMAL HIGH (ref 0.61–1.24)
GFR, Estimated: 12 mL/min — ABNORMAL LOW (ref >60)
Glucose, Bld: 129 mg/dL — ABNORMAL HIGH (ref 70–99)
Potassium: 4.5 mmol/L (ref 3.5–5.1)
Sodium: 134 mmol/L — ABNORMAL LOW (ref 135–145)
Total Bilirubin: 0.6 mg/dL (ref 0.3–1.2)
Total Protein: 6.1 g/dL — ABNORMAL LOW (ref 6.5–8.1)

## 2022-06-23 LAB — CBC
HCT: 26.2 % — ABNORMAL LOW (ref 39.0–52.0)
Hemoglobin: 8 g/dL — ABNORMAL LOW (ref 13.0–17.0)
MCH: 31.3 pg (ref 26.0–34.0)
MCHC: 30.5 g/dL (ref 30.0–36.0)
MCV: 102.3 fL — ABNORMAL HIGH (ref 80.0–100.0)
Platelets: 177 10*3/uL (ref 150–400)
RBC: 2.56 MIL/uL — ABNORMAL LOW (ref 4.22–5.81)
RDW: 16.9 % — ABNORMAL HIGH (ref 11.5–15.5)
WBC: 8.1 10*3/uL (ref 4.0–10.5)
nRBC: 0 % (ref 0.0–0.2)

## 2022-06-23 LAB — I-STAT CHEM 8, ED
BUN: 52 mg/dL — ABNORMAL HIGH (ref 8–23)
Calcium, Ion: 1.1 mmol/L — ABNORMAL LOW (ref 1.15–1.40)
Chloride: 100 mmol/L (ref 98–111)
Creatinine, Ser: 5.2 mg/dL — ABNORMAL HIGH (ref 0.61–1.24)
Glucose, Bld: 127 mg/dL — ABNORMAL HIGH (ref 70–99)
HCT: 25 % — ABNORMAL LOW (ref 39.0–52.0)
Hemoglobin: 8.5 g/dL — ABNORMAL LOW (ref 13.0–17.0)
Potassium: 4.6 mmol/L (ref 3.5–5.1)
Sodium: 134 mmol/L — ABNORMAL LOW (ref 135–145)
TCO2: 25 mmol/L (ref 22–32)

## 2022-06-23 LAB — PROTIME-INR
INR: 1.3 — ABNORMAL HIGH (ref 0.8–1.2)
Prothrombin Time: 16.6 seconds — ABNORMAL HIGH (ref 11.4–15.2)

## 2022-06-23 LAB — APTT: aPTT: 36 seconds (ref 24–36)

## 2022-06-23 LAB — HIV ANTIBODY (ROUTINE TESTING W REFLEX): HIV Screen 4th Generation wRfx: NONREACTIVE

## 2022-06-23 MED ORDER — ONDANSETRON HCL 4 MG PO TABS: 4.0000 mg | ORAL_TABLET | Freq: Four times a day (QID) | ORAL | Status: DC | PRN

## 2022-06-23 MED ORDER — HEPARIN SODIUM (PORCINE) 5000 UNIT/ML IJ SOLN
5000.0000 [IU] | Freq: Three times a day (TID) | INTRAMUSCULAR | Status: DC
  Administered 2022-06-23 – 2022-06-29 (×12): 5000 [IU] via SUBCUTANEOUS
  Filled 2022-06-23 (×13): qty 1

## 2022-06-23 MED ORDER — BUSPIRONE HCL 5 MG PO TABS
10.0000 mg | ORAL_TABLET | Freq: Two times a day (BID) | ORAL | Status: DC
  Administered 2022-06-23 – 2022-07-02 (×18): 10 mg via ORAL
  Filled 2022-06-23 (×5): qty 2
  Filled 2022-06-23: qty 1
  Filled 2022-06-23 (×2): qty 2
  Filled 2022-06-23: qty 1
  Filled 2022-06-23 (×3): qty 2
  Filled 2022-06-23: qty 1
  Filled 2022-06-23 (×4): qty 2
  Filled 2022-06-23: qty 1

## 2022-06-23 MED ORDER — ACETAMINOPHEN 325 MG PO TABS: 650.0000 mg | ORAL_TABLET | Freq: Four times a day (QID) | ORAL | Status: DC | PRN

## 2022-06-23 MED ORDER — ESCITALOPRAM OXALATE 20 MG PO TABS
20.0000 mg | ORAL_TABLET | Freq: Every day | ORAL | Status: DC
  Administered 2022-06-24 – 2022-07-02 (×9): 20 mg via ORAL
  Filled 2022-06-23 (×3): qty 1
  Filled 2022-06-23: qty 2
  Filled 2022-06-23: qty 1
  Filled 2022-06-23: qty 2
  Filled 2022-06-23 (×3): qty 1

## 2022-06-23 MED ORDER — ONDANSETRON HCL 4 MG/2ML IJ SOLN: 4.0000 mg | Freq: Four times a day (QID) | INTRAMUSCULAR | Status: DC | PRN

## 2022-06-23 MED ORDER — MELATONIN 5 MG PO TABS
5.0000 mg | ORAL_TABLET | Freq: Every day | ORAL | Status: DC
  Administered 2022-06-23 – 2022-07-01 (×9): 5 mg via ORAL
  Filled 2022-06-23 (×9): qty 1

## 2022-06-23 MED ORDER — ACETAMINOPHEN 650 MG RE SUPP: 650.0000 mg | Freq: Four times a day (QID) | RECTAL | Status: DC | PRN

## 2022-06-23 MED ORDER — LEVOTHYROXINE SODIUM 50 MCG PO TABS: 175.0000 ug | ORAL_TABLET | Freq: Every day | ORAL | Status: DC

## 2022-06-23 MED ORDER — METOPROLOL TARTRATE 5 MG/5ML IV SOLN: 5.0000 mg | Freq: Four times a day (QID) | INTRAVENOUS | Status: DC | PRN

## 2022-06-23 MED ORDER — LEVOTHYROXINE SODIUM 50 MCG PO TABS
250.0000 ug | ORAL_TABLET | Freq: Every day | ORAL | Status: DC
  Administered 2022-06-24 – 2022-07-02 (×8): 250 ug via ORAL
  Filled 2022-06-23 (×8): qty 1

## 2022-06-23 MED ORDER — OXYCODONE HCL 5 MG PO TABS
5.0000 mg | ORAL_TABLET | ORAL | Status: DC | PRN
  Administered 2022-06-23 – 2022-07-02 (×17): 5 mg via ORAL
  Filled 2022-06-23 (×17): qty 1

## 2022-06-23 MED ORDER — ALBUTEROL SULFATE (2.5 MG/3ML) 0.083% IN NEBU: 2.5000 mg | INHALATION_SOLUTION | RESPIRATORY_TRACT | Status: DC | PRN

## 2022-06-23 MED ORDER — BUPRENORPHINE HCL 2 MG SL SUBL
2.0000 mg | SUBLINGUAL_TABLET | Freq: Every day | SUBLINGUAL | Status: DC
  Administered 2022-06-23 – 2022-07-01 (×9): 2 mg via SUBLINGUAL
  Filled 2022-06-23 (×9): qty 1

## 2022-06-23 MED ORDER — TRAZODONE HCL 50 MG PO TABS
25.0000 mg | ORAL_TABLET | Freq: Every evening | ORAL | Status: DC | PRN
  Administered 2022-06-23 – 2022-06-24 (×2): 25 mg via ORAL
  Filled 2022-06-23 (×2): qty 1

## 2022-06-23 MED ORDER — FENTANYL CITRATE PF 50 MCG/ML IJ SOSY
50.0000 ug | PREFILLED_SYRINGE | INTRAMUSCULAR | Status: DC | PRN
  Administered 2022-06-23 – 2022-06-30 (×7): 50 ug via INTRAVENOUS
  Filled 2022-06-23 (×8): qty 1

## 2022-06-23 MED ORDER — PANTOPRAZOLE SODIUM 40 MG PO TBEC
40.0000 mg | DELAYED_RELEASE_TABLET | Freq: Every day | ORAL | Status: DC
  Administered 2022-06-24 – 2022-07-02 (×9): 40 mg via ORAL
  Filled 2022-06-23 (×9): qty 1

## 2022-06-23 NOTE — ED Triage Notes (Signed)
Per EMS Pt coming from [place] [facility]. 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 All other digits on hand are painful as well. Pt states this has been going on for 3 wks.

## 2022-06-23 NOTE — ED Provider Notes (Signed)
[name] EMERGENCY DEPARTMENT AT [facility] Provider Note   CSN: [id number] Arrival date & time: 06/23/22  1550     History {Add pertinent medical, surgical, social history, OB history to HPI:1} Chief Complaint  Patient presents with   Hand Injury    Chase [name] is a 72 y.o. male.  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  Patient reports 3 weeks ago he started having right hand pain and tingling in his fingers in the setting of getting frequent CBG checks.  Says that hand pain is gotten more severe and has had discoloration of his 3 middle fingers on his right.  Reports that he is having severe discoloration and black finger tip of his right ring finger.  No fevers.  Has had 2 ultrasounds one on 06/04/2022 and one 3 days ago that showed mild to moderate peripheral arterial disease without occlusion of the right upper extremity with moderate (50 to 75%) stenosis of the right brachial proximal artery and moderate stenosis between the right brachial distal artery and right radial artery as well as the right ulnar artery.       Home Medications Prior to Admission medications   Medication Sig Start Date End Date Taking? Authorizing Provider  amitriptyline (ELAVIL) 10 MG tablet Take 20 mg by mouth at bedtime.    [provider]  buprenorphine (SUBUTEX) 2 MG SUBL SL tablet Place 2 mg under the tongue at bedtime.    [provider]  busPIRone (BUSPAR) 10 MG tablet Take 10-20 mg by mouth See admin instructions. Take 2 tablets in the AM and 1 tablet in the PM.    [provider]  calcitRIOL (ROCALTROL) 0.25 MCG capsule Take 0.25 mcg by mouth daily.    [provider]  calcium carbonate (TUMS - DOSED IN MG ELEMENTAL CALCIUM) 500 MG chewable tablet Chew 2 tablets by mouth 3 (three) times daily with meals.    [provider]   carvedilol (COREG) 25 MG tablet Take 25 mg by mouth 2 (two) times daily with a meal.    [provider]  cephALEXin (KEFLEX) 500 MG capsule Take 500 mg by mouth See admin instructions. Take as directed by home dialysis nurse.    [provider]  Cyanocobalamin (B-12) 3000 MCG CAPS Take 3,000 mcg by mouth daily.    [provider]  escitalopram (LEXAPRO) 20 MG tablet Take 20 mg by mouth daily.    [provider]  furosemide (LASIX) 20 MG tablet Take 20 mg by mouth 2 (two) times daily.    [provider]  imatinib (GLEEVEC) 400 MG tablet Take 400 mg by mouth daily. Take with meals and large glass of water.Caution:Chemotherapy.    [provider]  insulin glargine (LANTUS) 100 UNIT/ML injection Inject 8 Units into the skin at bedtime.    [provider]  levothyroxine (SYNTHROID) 175 MCG tablet Take 175 mcg by mouth daily before breakfast.    [provider]  melatonin 5 MG TABS Take 5 mg by mouth at bedtime.    [provider]  Multiple Vitamin (MULTIVITAMIN WITH MINERALS) TABS tablet Take 1 tablet by mouth daily.    [provider]  omeprazole (PRILOSEC) 40 MG capsule Take 40 mg by mouth 2 (two) times daily with breakfast and lunch.    [provider]  ondansetron (ZOFRAN) 4 MG tablet Take 4  mg by mouth every 8 (eight) hours as needed for nausea or vomiting.    [provider]  OVER THE COUNTER MEDICATION Take 1 tablet by mouth at bedtime. Nervive - contains thiamine 1.2mg , Vit B6 1.7mg , Vit B12 2.31mcg, calcium 27g, alpha-lipoic acid 600mg , also turmeric and ginger    [provider]  Probiotic Product (PROBIOTIC PO) Take 1 capsule by mouth daily.    [provider]  rosuvastatin (CRESTOR) 10 MG tablet Take 10 mg by mouth at bedtime.    [provider]  Semaglutide (OZEMPIC, 0.25 OR 0.5 MG/DOSE, [place]) Inject 0.5 mg into the skin every 7 (seven) days. Patient not  taking: Reported on 10/25/2020    [provider]  ursodiol (ACTIGALL) 300 MG capsule Take 300 mg by mouth 2 (two) times daily.    [provider]      Allergies    Gabapentin, Lipitor [atorvastatin], and Pregabalin    Review of Systems   Review of Systems  Physical Exam Updated Vital Signs BP (!) 158/65 (BP Location: Left Arm)   Pulse 75   Temp 98.6 F (37 C) (Oral)   Resp 16   Wt 93 kg   SpO2 (!) 84%   BMI 33.09 kg/m  Physical Exam Vitals and nursing note reviewed.  Constitutional:      General: He is not in acute distress.    Appearance: He is well-developed.  HENT:     Head: Normocephalic and atraumatic.     Right Ear: External ear normal.     Left Ear: External ear normal.     Nose: Nose normal.  Eyes:     Extraocular Movements: Extraocular movements intact.     Conjunctiva/sclera: Conjunctivae normal.     Pupils: Pupils are equal, round, and reactive to light.  Cardiovascular:     Rate and Rhythm: Normal rate and regular rhythm.     Comments: Fistula in left upper extremity with bruit and thrill Pulmonary:     Effort: Pulmonary effort is normal. No respiratory distress.     Comments: Patient satting in the mid 90s while I was in the room on room air Abdominal:     General: There is no distension.     Palpations: Abdomen is soft. There is no mass.     Tenderness: There is no abdominal tenderness. There is no guarding.     Comments: PD catheter in place  Musculoskeletal:     Cervical back: Normal range of motion and neck supple.     Right lower leg: Edema present.     Left lower leg: Edema present.     Comments: DP pulses 2+ bilaterally in lower extremities.  Unable to palpate radial or ulnar pulse in the right upper extremity.  See below for images of the hand. Does have dopplerable pulse on R radial artery.   Skin:    General: Skin is warm and dry.  Neurological:     Mental Status: He is alert. Mental status is at baseline.  Psychiatric:         Mood and Affect: Mood normal.        Behavior: Behavior normal.    Right hand:.        ED Results / Procedures / Treatments   Labs (all labs ordered are listed, but only abnormal results are displayed) Labs Reviewed - No data to display  EKG None  Radiology No results found.  Procedures Procedures  {Document cardiac monitor, telemetry assessment procedure when  appropriate:1}  Medications Ordered in ED Medications - No data to display  ED Course/ Medical Decision Making/ A&P Clinical Course as of 06/23/22 1810  Mon Jun 23, 2022  5784 Dr Chestine Spore from vascular surgery consulted. Recommends admission to [facility] and get hand surgery to see him. Won't get angiogram before Friday due to schedule. [RP]  1742 Dr Chestine Spore called back. May be able to get patient for angio tomorrow.  [RP]  1759 Dr Yehuda Budd from hand surgery.  [RP]  1808 Dr Erenest Blank [RP]    Clinical Course User Index [RP] Rondel Baton, MD   {   Click here for ABCD2, HEART and other calculatorsREFRESH Note before signing :1}                          Medical Decision Making Amount and/or Complexity of Data Reviewed Labs: ordered.  Risk Prescription drug management. Decision regarding hospitalization.   ***  {Document critical care time when appropriate:1} {Document review of labs and clinical decision tools ie heart score, Chads2Vasc2 etc:1}  {Document your independent review of radiology images, and any outside records:1} {Document your discussion with family members, caretakers, and with consultants:1} {Document social determinants of health affecting pt's care:1} {Document your decision making why or why not admission, treatments were needed:1} Final Clinical Impression(s) / ED Diagnoses Final diagnoses:  None    Rx / DC Orders ED Discharge Orders     None

## 2022-06-23 NOTE — ED Notes (Signed)
ED TO INPATIENT HANDOFF REPORT  ED Nurse Name and Phone #: Linus Orn Name/Age/Gender Chase [name] 72 y.o. male Room/Bed: WA10/WA10  Code Status   Code Status: Full Code  Home/SNF/Other Skilled nursing facility Patient oriented to: self, place, time, and situation Is this baseline? Yes   Triage Complete: Triage complete  Chief Complaint Gangrene of finger of right hand [facility]) [I96]  Triage Note Per EMS Pt coming from [facility]. 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 All other digits on hand are painful as well. Pt states this has been going on for 3 wks.    Allergies Allergies  Allergen Reactions   Gabapentin     Cognitive impairment per spouse   Lipitor [Atorvastatin]     Severe muscle cramps per spouse   Pregabalin     Cognitive impairment. Dizziness.    Level of Care/Admitting Diagnosis ED Disposition     ED Disposition  Admit   Condition  --   Comment  Hospital Area: [facility] [[id number]]  Level of Care: Med-Surg [16]  May admit patient to [facility] or [facility] if equivalent level of care is available:: No  Covid Evaluation: Asymptomatic - no recent exposure (last 10 days) testing not required  Diagnosis: Gangrene of finger of right hand [facility]) [[id number]]  Admitting Physician: Maryln Gottron [[id number]]  Attending Physician: [facility], MIR [email or web address] [[id number]]  Certification:: I certify this patient will need inpatient services for at least 2 midnights  Estimated Length of Stay: 3          B Medical/Surgery History Past Medical History:  Diagnosis Date   Back pain with radiation    Coronary artery disease    Depression    Diabetes mellitus without complication (HCC)    Dialysis patient (HCC)    M, W, F   History of traumatic head injury    Hypertension    Renal disorder    Thyroid disease    Past Surgical History:  Procedure Laterality Date   ERCP       A IV  Location/Drains/Wounds Patient Lines/Drains/Airways Status     Active Line/Drains/Airways     Name Placement date Placement time Site Days   Peripheral IV 06/23/22 20 G 1" Anterior;Proximal;Right Forearm 06/23/22  1708  Forearm  less than 1   Fistula / Graft Left Forearm 10/25/20  0500  Forearm  606            Intake/Output Last 24 hours No intake or output data in the 24 hours ending 06/23/22 1838  Labs/Imaging Results for orders placed or performed during the hospital encounter of 06/23/22 (from the past 48 hour(s))  I-stat chem 8, ED (not at [facility], [facility] or [facility])     Status: Abnormal   Collection Time: 06/23/22  5:27 PM  Result Value Ref Range   Sodium 134 (L) 135 - 145 mmol/L   Potassium 4.6 3.5 - 5.1 mmol/L   Chloride 100 98 - 111 mmol/L   BUN 52 (H) 8 - 23 mg/dL   Creatinine, Ser 5.78 (H) 0.61 - 1.24 mg/dL   Glucose, Bld 469 (H) 70 - 99 mg/dL    Comment: Glucose reference range applies only to samples taken after fasting for at least 8 hours.   Calcium, Ion 1.10 (L) 1.15 - 1.40 mmol/L   TCO2 25 22 - 32 mmol/L   Hemoglobin 8.5 (L) 13.0 - 17.0 g/dL   HCT 62.9 (L) 52.8 - 41.3 %  CBC     Status: Abnormal   Collection Time: 06/23/22  5:55 PM  Result Value Ref Range   WBC 8.1 4.0 - 10.5 K/uL   RBC 2.56 (L) 4.22 - 5.81 MIL/uL   Hemoglobin 8.0 (L) 13.0 - 17.0 g/dL   HCT 16.1 (L) 09.6 - 04.5 %   MCV 102.3 (H) 80.0 - 100.0 fL   MCH 31.3 26.0 - 34.0 pg   MCHC 30.5 30.0 - 36.0 g/dL   RDW 40.9 (H) 81.1 - 91.4 %   Platelets 177 150 - 400 K/uL   nRBC 0.0 0.0 - 0.2 %    Comment: Performed at [facility], [address], [place], [place] [address]   No results found.  Pending Labs Unresulted Labs (From admission, onward)     Start     Ordered   06/24/22 0500  Basic metabolic panel  Tomorrow morning,   R        06/23/22 1827   06/24/22 0500  CBC  Tomorrow morning,   R        06/23/22 1827   06/23/22 1826  HIV Antibody (routine testing w rflx)  (HIV  Antibody (Routine testing w reflex) panel)  Once,   R        06/23/22 1827   06/23/22 1636  Lactic acid, plasma  Now then every 2 hours,   R (with STAT occurrences)      06/23/22 1635   06/23/22 1636  Comprehensive metabolic panel  Once,   STAT        06/23/22 1635   06/23/22 1636  Protime-INR  Once,   STAT        06/23/22 1635   06/23/22 1636  APTT  Once,   STAT        06/23/22 1635            Vitals/Pain Today's Vitals   06/23/22 1602 06/23/22 1603 06/23/22 1709 06/23/22 1711  BP: (!) 158/65     Pulse: 75     Resp: 16     Temp: 98.6 F (37 C)     TempSrc: Oral     SpO2: (!) 84%     Weight:  93 kg 88.5 kg   Height:   5\' 6"  (1.676 m)   PainSc:  10-Worst pain ever  8     Isolation Precautions No active isolations  Medications Medications  fentaNYL (SUBLIMAZE) injection 50 mcg (50 mcg Intravenous Given 06/23/22 1708)  buprenorphine (SUBUTEX) SL tablet 2 mg (has no administration in time range)  melatonin tablet 5 mg (has no administration in time range)  pantoprazole (PROTONIX) EC tablet 40 mg (has no administration in time range)  levothyroxine (SYNTHROID) tablet 175 mcg (has no administration in time range)  busPIRone (BUSPAR) tablet 10 mg (has no administration in time range)  escitalopram (LEXAPRO) tablet 20 mg (has no administration in time range)  heparin injection 5,000 Units (has no administration in time range)  acetaminophen (TYLENOL) tablet 650 mg (has no administration in time range)    Or  acetaminophen (TYLENOL) suppository 650 mg (has no administration in time range)  traZODone (DESYREL) tablet 25 mg (has no administration in time range)  ondansetron (ZOFRAN) tablet 4 mg (has no administration in time range)    Or  ondansetron (ZOFRAN) injection 4 mg (has no administration in time range)  oxyCODONE (Oxy IR/ROXICODONE) immediate release tablet 5 mg (has no administration in time range)  metoprolol tartrate (LOPRESSOR) injection 5 mg (  has no  administration in time range)  albuterol (PROVENTIL) (2.5 MG/3ML) 0.083% nebulizer solution 2.5 mg (has no administration in time range)    Mobility walks with device     Focused Assessments Vascular   R Recommendations: See Admitting Provider Note  Report given to:   Additional Notes: AAOx4,Ambulates with walker, uses wheelchair sometimes

## 2022-06-23 NOTE — Consult Note (Signed)
Hospital Consult    Reason for Consult:  Gangrene right hand Referring Physician:  [facility] ED MRN #:  [id number]  History of Present Illness: This is a 72 y.o. 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  Patient states this started about 3 weeks ago.  He states he is currently using a PD catheter for dialysis.  Has a fistula in the left arm that has a thrill at the wrist.  No current access in the right arm.  Past Medical History:  Diagnosis Date   Back pain with radiation    Coronary artery disease    Depression    Diabetes mellitus without complication (HCC)    Dialysis patient (HCC)    M, W, F   History of traumatic head injury    Hypertension    Renal disorder    Thyroid disease     Past Surgical History:  Procedure Laterality Date   ERCP      Allergies  Allergen Reactions   Gabapentin     Cognitive impairment per spouse   Lipitor [Atorvastatin]     Severe muscle cramps per spouse   Pregabalin     Cognitive impairment. Dizziness.    Prior to Admission medications   Medication Sig Start Date End Date Taking? Authorizing Provider  amitriptyline (ELAVIL) 10 MG tablet Take 20 mg by mouth at bedtime.    [provider]  buprenorphine (SUBUTEX) 2 MG SUBL SL tablet Place 2 mg under the tongue at bedtime.    [provider]  busPIRone (BUSPAR) 10 MG tablet Take 10-20 mg by mouth See admin instructions. Take 2 tablets in the AM and 1 tablet in the PM.    [provider]  calcitRIOL (ROCALTROL) 0.25 MCG capsule Take 0.25 mcg by mouth daily.    [provider]  calcium carbonate (TUMS - DOSED IN MG ELEMENTAL CALCIUM) 500 MG chewable tablet Chew 2 tablets by mouth 3 (three) times daily with meals.    [provider]  carvedilol (COREG) 25 MG tablet Take 25 mg by mouth 2 (two) times daily with a meal.    [provider]  cephALEXin (KEFLEX) 500 MG capsule  Take 500 mg by mouth See admin instructions. Take as directed by home dialysis nurse.    [provider]  Cyanocobalamin (B-12) 3000 MCG CAPS Take 3,000 mcg by mouth daily.    [provider]  escitalopram (LEXAPRO) 20 MG tablet Take 20 mg by mouth daily.    [provider]  furosemide (LASIX) 20 MG tablet Take 20 mg by mouth 2 (two) times daily.    [provider]  imatinib (GLEEVEC) 400 MG tablet Take 400 mg by mouth daily. Take with meals and large glass of water.Caution:Chemotherapy.    [provider]  insulin glargine (LANTUS) 100 UNIT/ML injection Inject 8 Units into the skin at bedtime.    [provider]  levothyroxine (SYNTHROID) 175 MCG tablet Take 175 mcg by mouth daily before breakfast.    [provider]  melatonin 5 MG TABS Take 5 mg by mouth at bedtime.    [provider]  Multiple Vitamin (MULTIVITAMIN WITH MINERALS) TABS tablet Take 1 tablet by mouth daily.    [provider]  omeprazole (PRILOSEC) 40 MG capsule Take 40 mg by mouth 2 (two) times daily with breakfast and lunch.    [provider]  ondansetron [facility]) 4  MG tablet Take 4 mg by mouth every 8 (eight) hours as needed for nausea or vomiting.    [provider]  OVER THE COUNTER MEDICATION Take 1 tablet by mouth at bedtime. Nervive - contains thiamine 1.2mg , Vit B6 1.7mg , Vit B12 2.66mcg, calcium 27g, alpha-lipoic acid 600mg , also turmeric and ginger    [provider]  Probiotic Product (PROBIOTIC PO) Take 1 capsule by mouth daily.    [provider]  rosuvastatin (CRESTOR) 10 MG tablet Take 10 mg by mouth at bedtime.    [provider]  Semaglutide (OZEMPIC, 0.25 OR 0.5 MG/DOSE, [facility]) Inject 0.5 mg into the skin every 7 (seven) days. Patient not taking: Reported on 10/25/2020    [provider]  ursodiol (ACTIGALL) 300 MG capsule Take 300 mg by mouth 2 (two) times daily.    [provider]    Social History   Socioeconomic History   Marital status: Married    Spouse name: Not on file   Number of children: Not on file   Years of education: Not on file   Highest education level: Not on file  Occupational History   Not on file  Tobacco Use   Smoking status: Never   Smokeless tobacco: Never  Substance and Sexual Activity   Alcohol use: Never   Drug use: Never   Sexual activity: Yes  Other Topics Concern   Not on file  Social History Narrative   Not on file   Social Determinants of Health   Financial Resource Strain: Not on file  Food Insecurity: Not on file  Transportation Needs: Not on file  Physical Activity: Not on file  Stress: Not on file  Social Connections: Not on file  Intimate Partner Violence: Not on file     History reviewed. No pertinent family history.  ROS: [x]  Positive   [ ]  Negative   [ ]  All sytems reviewed and are negative  Cardiovascular: []  chest pain/pressure []  palpitations []  SOB lying flat []  DOE []  pain in legs while walking []  pain in legs at rest []  pain in legs at night []  non-healing ulcers []  hx of DVT []  swelling in legs  Pulmonary: []  productive cough []  asthma/wheezing []  home O2  Neurologic: []  weakness in []  arms []  legs []  numbness in []  arms []  legs []  hx of CVA []  mini stroke [] difficulty speaking or slurred speech []  temporary loss of vision in one eye []  dizziness  Hematologic: []  hx of cancer []  bleeding problems []  problems with blood clotting easily  Endocrine:   []  diabetes []  thyroid disease  GI []  vomiting blood []  blood in stool  GU: []  CKD/renal failure []  HD--[]  M/W/F or []  T/T/S []  burning with urination []  blood in urine  Psychiatric: []  anxiety []  depression  Musculoskeletal: []  arthritis []  joint pain  Integumentary: []  rashes []  ulcers  Constitutional: []  fever []  chills   Physical Examination  Vitals:   06/23/22 1602  BP: (!) 158/65   Pulse: 75  Resp: 16  Temp: 98.6 F (37 C)  SpO2: (!) 84%   Body mass index is 31.47 kg/m.  General:  WDWN in NAD Gait: Not observed HENT: WNL, normocephalic Pulmonary: normal non-labored breathing Cardiac: regular, without  Murmurs, rubs or gallops Abdomen:  soft, NT/ND Vascular Exam/Pulses: Right brachial pulse palpable No palpable pulses at the right wrist Ischemic changes to the right digits as pictured worse at the ring finger Bilateral femoral pulses palpable Left radiocephalic fistula  with thrill Musculoskeletal: no muscle wasting or atrophy  Neurologic: A&O X 3; Appropriate Affect ; SENSATION: normal; MOTOR FUNCTION:  moving all extremities equally. Speech is fluent/normal      CBC    Component Value Date/Time   WBC 8.1 06/23/2022 1755   RBC 2.56 (L) 06/23/2022 1755   HGB 8.0 (L) 06/23/2022 1755   HCT 26.2 (L) 06/23/2022 1755   PLT 177 06/23/2022 1755   MCV 102.3 (H) 06/23/2022 1755   MCH 31.3 06/23/2022 1755   MCHC 30.5 06/23/2022 1755   RDW 16.9 (H) 06/23/2022 1755   LYMPHSABS 1.1 06/04/2022 1341   MONOABS 0.6 06/04/2022 1341   EOSABS 0.4 06/04/2022 1341   BASOSABS 0.1 06/04/2022 1341    BMET    Component Value Date/Time   NA 134 (L) 06/23/2022 1755   K 4.5 06/23/2022 1755   CL 97 (L) 06/23/2022 1755   CO2 26 06/23/2022 1755   GLUCOSE 129 (H) 06/23/2022 1755   BUN 56 (H) 06/23/2022 1755   CREATININE 4.99 (H) 06/23/2022 1755   CALCIUM 8.6 (L) 06/23/2022 1755   GFRNONAA 12 (L) 06/23/2022 1755    COAGS: Lab Results  Component Value Date   INR 1.3 (H) 06/23/2022   INR 1.1 12/15/2021   INR 1.0 10/25/2020     Non-Invasive Vascular Imaging:    Upper extremity arterial duplex 06/04/2022 showed less than 50% stenosis in the radial and ulnar arteries   ASSESSMENT/PLAN: This is a  72 y.o. 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  His AV fistula access is in the left upper  extremity and not the right upper extremity.  I can palpate a brachial pulse at the antecubitum but no pulses at the wrist.  Recent duplex last month showed less than 50% stenosis in the the radial and ulnar arteries.  I think it would be reasonable to proceed with angiogram from a transfemoral approach with a focus in the right upper extremity tomorrow in the Cath Lab to evaluate his inflow.  Please keep n.p.o. after midnight.  Consent order will be placed.  Risk benefits discussed.  I have also discussed with the ED about consulting hand surgery.  Cephus Shelling, MD [facility] [place] Office: [phone]  Cephus Shelling

## 2022-06-23 NOTE — H&P (Signed)
History and Physical  Chase [name] ZOX:[id number] DOB: 1950/06/04 DOA: 06/23/2022  PCP: [name], MD   Chief Complaint: Right hand pain  HPI: Chase [name] is a 72 y.o. male with medical history significant for ESRD on Tuesday Thursday Saturday dialysis, neck pain, non-insulin-dependent type 2 diabetes, hypertension, hypothyroidism being admitted to the hospital with dry gangrene of the right hand.  Patient was admitted to [facility] 5/1 to 5/7 due to uremia, at which time he was transitioned from PD to HD.  He was discharged from that hospital stay to nursing home, where he has overall been doing well, per the patient and his wife.  He started having pain in the right hand about 3 weeks ago.  He was seen in this ER on 5/29 with complaints of right fingertip pain, at that time he had good capillary refill, images were reviewed from that date.  He also had arterial Doppler on that day without evidence of significant stenosis or impaired blood flow.  He was discharged back to his nursing facility that date, but since that time pain has continued and worsened, he has developed dark discoloration of the ring finger of the right hand.  ED Course: Upon evaluation in the ER, he is moderately hypertensive, but vital signs otherwise stable.  Lab work was done and without any acute abnormality, relatively stable at baseline.  He is due for hemodialysis tomorrow, he is on a Tuesday Thursday Saturday schedule.  Review of Systems: Please see HPI for pertinent positives and negatives. A complete 10 system review of systems are otherwise negative.  Past Medical History:  Diagnosis Date   Back pain with radiation    Coronary artery disease    Depression    Diabetes mellitus without complication (HCC)    Dialysis patient (HCC)    M, W, F   History of traumatic head injury    Hypertension    Renal disorder    Thyroid disease    Past Surgical History:   Procedure Laterality Date   ERCP      Social History:  reports that he has never smoked. He has never used smokeless tobacco. He reports that he does not drink alcohol and does not use drugs.   Allergies  Allergen Reactions   Gabapentin     Cognitive impairment per spouse   Lipitor [Atorvastatin]     Severe muscle cramps per spouse   Pregabalin     Cognitive impairment. Dizziness.    History reviewed. No pertinent family history.   Prior to Admission medications   Medication Sig Start Date End Date Taking? Authorizing Provider  amitriptyline (ELAVIL) 10 MG tablet Take 20 mg by mouth at bedtime.    [provider]  buprenorphine (SUBUTEX) 2 MG SUBL SL tablet Place 2 mg under the tongue at bedtime.    [provider]  busPIRone (BUSPAR) 10 MG tablet Take 10-20 mg by mouth See admin instructions. Take 2 tablets in the AM and 1 tablet in the PM.    [provider]  calcitRIOL (ROCALTROL) 0.25 MCG capsule Take 0.25 mcg by mouth daily.    [provider]  calcium carbonate (TUMS - DOSED IN MG ELEMENTAL CALCIUM) 500 MG chewable tablet Chew 2 tablets by mouth 3 (three) times daily with meals.    [provider]  carvedilol (COREG) 25 MG tablet Take 25 mg by mouth 2 (two) times daily with a meal.    [provider]  cephALEXin (KEFLEX) 500 MG capsule Take 500 mg by mouth See admin instructions. Take as directed by home dialysis nurse.    [provider]  Cyanocobalamin (B-12) 3000 MCG CAPS Take 3,000 mcg by mouth daily.    [provider]  escitalopram (LEXAPRO) 20 MG tablet Take 20 mg by mouth daily.    [provider]  furosemide (LASIX) 20 MG tablet Take 20 mg by mouth 2 (two) times daily.    [provider]  imatinib (GLEEVEC) 400 MG tablet Take 400 mg by mouth daily. Take with meals and large glass of water.Caution:Chemotherapy.    [provider]  insulin glargine (LANTUS) 100 UNIT/ML  injection Inject 8 Units into the skin at bedtime.    [provider]  levothyroxine (SYNTHROID) 175 MCG tablet Take 175 mcg by mouth daily before breakfast.    [provider]  melatonin 5 MG TABS Take 5 mg by mouth at bedtime.    [provider]  Multiple Vitamin (MULTIVITAMIN WITH MINERALS) TABS tablet Take 1 tablet by mouth daily.    [provider]  omeprazole (PRILOSEC) 40 MG capsule Take 40 mg by mouth 2 (two) times daily with breakfast and lunch.    [provider]  ondansetron (ZOFRAN) 4 MG tablet Take 4 mg by mouth every 8 (eight) hours as needed for nausea or vomiting.    [provider]  OVER THE COUNTER MEDICATION Take 1 tablet by mouth at bedtime. Nervive - contains thiamine 1.2mg , Vit B6 1.7mg , Vit B12 2.54mcg, calcium 27g, alpha-lipoic acid 600mg , also turmeric and ginger    [provider]  Probiotic Product (PROBIOTIC PO) Take 1 capsule by mouth daily.    [provider]  rosuvastatin (CRESTOR) 10 MG tablet Take 10 mg by mouth at bedtime.    [provider]  Semaglutide (OZEMPIC, 0.25 OR 0.5 MG/DOSE, ) Inject 0.5 mg into the skin every 7 (seven) days. Patient not taking: Reported on 10/25/2020    [provider]  ursodiol (ACTIGALL) 300 MG capsule Take 300 mg by mouth 2 (two) times daily.    [provider]    Physical Exam: BP (!) 158/65 (BP Location: Left Arm)   Pulse 75   Temp 98.6 F (37 C) (Oral)   Resp 16   Ht 5\' 6"  (1.676 m)   Wt 88.5 kg   SpO2 (!) 84%   BMI 31.47 kg/m   General:  Alert, oriented, calm, in no acute distress  Eyes: EOMI, clear conjuctivae, white sclerea Neck: supple, no masses, trachea mildline  Cardiovascular: RRR, no murmurs or rubs, no peripheral edema, right hand with poor capillary refill, signs of ischemia and dry gangrene see image below Respiratory: clear to auscultation bilaterally, no wheezes, no crackles  Abdomen: soft, nontender,  nondistended, normal bowel tones heard  Skin: dry, no rashes  Musculoskeletal: no joint effusions, normal range of motion  Psychiatric: appropriate affect, normal speech  Neurologic: extraocular muscles intact, clear speech, moving all extremities with intact sensorium                Labs on Admission:  Basic Metabolic Panel: Recent Labs  Lab 06/23/22 1727  NA 134*  K 4.6  CL 100  GLUCOSE 127*  BUN 52*  CREATININE 5.20*   Liver Function Tests: No results for input(s): "AST", "ALT", "ALKPHOS", "BILITOT", "PROT", "ALBUMIN" in the last 168 hours. No results for input(s): "LIPASE", "AMYLASE" in the last 168 hours. No results for input(s): "  AMMONIA" in the last 168 hours. CBC: Recent Labs  Lab 06/23/22 1727 06/23/22 1755  WBC  --  8.1  HGB 8.5* 8.0*  HCT 25.0* 26.2*  MCV  --  102.3*  PLT  --  177   Cardiac Enzymes: No results for input(s): "CKTOTAL", "CKMB", "CKMBINDEX", "TROPONINI" in the last 168 hours.  BNP (last 3 results) No results for input(s): "BNP" in the last 8760 hours.  ProBNP (last 3 results) No results for input(s): "PROBNP" in the last 8760 hours.  CBG: No results for input(s): "GLUCAP" in the last 168 hours.  Radiological Exams on Admission: No results found.  Assessment/Plan This is a 72 year old gentleman with a history of ESRD, diabetes currently not insulin-dependent, hypertension, hypothyroidism being admitted to the hospital with concern for dry gangrene and ischemic right hand digits.  Ischemic right ring finger-with no evidence of wet gangrene, or active infection -Inpatient admission to [facility], per vascular surgery request -Carb controlled and renal diet today, n.p.o. after midnight -ER provider discussed with Dr. Chestine Spore of vascular surgery, who plans angiogram in the morning, does not recommend any further imaging tonight -ER provider has also discussed this evening with Dr. Judithann Sheen of hand surgery, they will also formally consult  in the morning -Pain control with oxycodone 5 mg p.o. every 4 hours as needed  ESRD-patient on Tuesday Thursday Saturday dialysis schedule -Currently no indication for urgent dialysis -Would recommend contact on-call nephrology in the morning  Depression-Lexapro, BuSpar  Chronic pain-continue home Subutex 2 mg every evening  Thyroidism-continue Synthroid 175 mcg p.o. daily  GERD-Protonix 40 mg p.o. daily  DVT prophylaxis: Lovenox     Code Status: Full Code  Consults called: ER provider discussed with vascular surgery as well as hand surgery, nephrology was messaged for routine dialysis in the morning.  Admission status: The appropriate patient status for this patient is INPATIENT. Inpatient status is judged to be reasonable and necessary in order to provide the required intensity of service to ensure the patient's safety. The patient's presenting symptoms, physical exam findings, and initial radiographic and laboratory data in the context of their chronic comorbidities is felt to place them at high risk for further clinical deterioration. Furthermore, it is not anticipated that the patient will be medically stable for discharge from the hospital within 2 midnights of admission.    I certify that at the point of admission it is my clinical judgment that the patient will require inpatient hospital care spanning beyond 2 midnights from the point of admission due to high intensity of service, high risk for further deterioration and high frequency of surveillance required  Time spent: 65 minutes  Jehiel Koepp Sharlette Dense MD [facility] Pager [phone]  If 7PM-7AM, please contact night-coverage [email or web address] Password [facility]  06/23/2022, 6:29 PM

## 2022-06-23 NOTE — Plan of Care (Signed)

## 2022-06-23 NOTE — Progress Notes (Signed)
72 year old male with end-stage renal disease and diabetes presenting to [facility] long ED with dry gangrene of digits of the right hand.  I have recommended admission to [facility] and appreciate the hospitalist.  Will try and then plan angiogram tomorrow as the schedule allows.  Please keep n.p.o. after midnight.  Cephus Shelling, MD [facility] [place] Office: [phone]   Cephus Shelling

## 2022-06-24 DIAGNOSIS — N186 End stage renal disease: Secondary | ICD-10-CM

## 2022-06-24 DIAGNOSIS — I96 Gangrene, not elsewhere classified: Secondary | ICD-10-CM | POA: Diagnosis not present

## 2022-06-24 LAB — CBC
HCT: 25.7 % — ABNORMAL LOW (ref 39.0–52.0)
Hemoglobin: 8 g/dL — ABNORMAL LOW (ref 13.0–17.0)
MCH: 31.5 pg (ref 26.0–34.0)
MCHC: 31.1 g/dL (ref 30.0–36.0)
MCV: 101.2 fL — ABNORMAL HIGH (ref 80.0–100.0)
Platelets: 193 10*3/uL (ref 150–400)
RBC: 2.54 MIL/uL — ABNORMAL LOW (ref 4.22–5.81)
RDW: 16.8 % — ABNORMAL HIGH (ref 11.5–15.5)
WBC: 8.5 10*3/uL (ref 4.0–10.5)
nRBC: 0 % (ref 0.0–0.2)

## 2022-06-24 LAB — BASIC METABOLIC PANEL
Anion gap: 13 (ref 5–15)
BUN: 59 mg/dL — ABNORMAL HIGH (ref 8–23)
CO2: 25 mmol/L (ref 22–32)
Calcium: 8.9 mg/dL (ref 8.9–10.3)
Chloride: 97 mmol/L — ABNORMAL LOW (ref 98–111)
Creatinine, Ser: 5.27 mg/dL — ABNORMAL HIGH (ref 0.61–1.24)
GFR, Estimated: 11 mL/min — ABNORMAL LOW (ref >60)
Glucose, Bld: 92 mg/dL (ref 70–99)
Potassium: 5 mmol/L (ref 3.5–5.1)
Sodium: 135 mmol/L (ref 135–145)

## 2022-06-24 LAB — SURGICAL PCR SCREEN
MRSA, PCR: NEGATIVE
Staphylococcus aureus: NEGATIVE

## 2022-06-24 LAB — HEPATITIS B SURFACE ANTIGEN: Hepatitis B Surface Ag: NONREACTIVE

## 2022-06-24 MED ORDER — CHLORHEXIDINE GLUCONATE CLOTH 2 % EX PADS
6.0000 | MEDICATED_PAD | Freq: Every day | CUTANEOUS | Status: DC
  Administered 2022-06-25 – 2022-07-02 (×8): 6 via TOPICAL

## 2022-06-24 NOTE — Progress Notes (Signed)
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  He was admitted to [facility] from 5 1 until 05/13/2022 due to uremia from that time he was transition from PD to HD.  He is just from the hospital stay to nursing home regimen overall doing well per patient and his wife.  About 3 weeks ago he started having pain in his right hand for about 3 weeks and progressively gotten worse and he was sent to the ED on 06/04/2022 for complaints of right fingertip pain and at that time he had good capillary refill images reviewed from that day and he had an arterial Doppler on that day without significant stenosis or impaired blood flow.  He discharged back to the nursing facility but since then the pain has continued and worsened and he developed dark discoloration of the right ring finger on his right hand and ulcer.  Upon evaluation of the ED he is moderately hypertensive and lab work was done.  Vascular surgery and hand surgery was consulted and vascular surgery evaluated and are planning a angiogram with a transfemoral approach with a focus in the right upper extremity today to evaluate his inflow.  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 after midnight -ER provider discussed with Dr. Chestine Spore of vascular surgery, who plans angiogram in the morning, does not recommend any further imaging  tonight -LA and WBC Recent Labs  Lab 06/04/22 1341 06/23/22 1755 06/23/22 1755 06/23/22 2112 06/24/22 0122  WBC 8.9 8.1  --   --  8.5  LATICACIDVEN  --  1.1   < > 0.8  --    < > = values in this interval not displayed.  -ER provider has also discussed this evening with Dr. Yehuda Budd of hand surgery, they will also formally consult in the morning -Pain control with Oxycodone 5 mg p.o. every 4 hours as needed  ESRD on HD TThSat -BUN/Cr Trend: Recent Labs  Lab 06/04/22 1341 06/23/22 1727 06/23/22 1755 06/24/22 0122  BUN 33* 52* 56* 59*  CREATININE 4.01* 5.20* 4.99* 5.27*  -No indication for Urgent Dialysis but Nephrology consulted for Maintenance of Dialysis  -Avoid Nephrotoxic Medications, Contrast Dyes, Hypotension and Dehydration to Ensure Adequate Renal Perfusion and will need to Renally Adjust Meds -Continue to Monitor and Trend Renal Function carefully and repeat CMP in the AM   Macrocytic Anemia/Anemia of Chronic Kidney Disease -Hgb/Hct Trend Recent Labs  Lab 06/04/22 1341 06/23/22 1727 06/23/22 1755 06/24/22 0122  HGB 7.7* 8.5* 8.0* 8.0*  HCT 25.7* 25.0* 26.2* 25.7*  MCV 101.6*  --  102.3* 101.2*  -Check Anemia Panel in the AM -Continue to Monitor for S/Sx of Bleeding; No overt bleeding noted -Repeat CBC in the AM  Chronic Back and Neck Pain -C/w Buprenorphine 2 mg sL at bedtime -C/w Oxycodone 5 mg po q4hprrn Moderate [place]aine and IV Fentanyl 50 mcg q1hprn Moderate Pain  Essential HTN -C/w Metoprolol Tartrate 5 mg IV q6hprn HBP -Continue  to Monitor BP per Protocol  -Last BP reading was 134/67  Depression and Anxiety -C/w Buspirone 10 mg po BID and Escitalopram 20 mg po Daily  Hypothyroidism -Check TSH in the AM -C/w Levothyroxine 250 mcg po Daily  Hyponatremia -Na+ Trend: Recent Labs  Lab 06/04/22 1341 06/23/22 1727 06/23/22 1755 06/24/22 0122  NA 136 134* 134* 135  -Continue to Monitor and Trend and repeat CMP in the AM  GERD  -C/w PPI with  Pantoprazole 40 mg po Daily  Hypoalbuminemia -Patient's Albumin Trend: Recent Labs  Lab 06/23/22 1755  ALBUMIN 2.5*  -Continue to Monitor and Trend and repeat CMP in the AM  Obesity -Complicates overall prognosis and care -Estimated body mass index is 33.52 kg/m as calculated from the following:   Height as of this encounter: 5\' 6"  (1.676 m).   Weight as of this encounter: 94.2 kg.  -Weight Loss and Dietary Counseling given   DVT prophylaxis: heparin injection 5,000 Units Start: 06/23/22 2200 SCDs Start: 06/23/22 1826    Code Status: Full Code Family Communication: No family currently at bedside  Disposition Plan:  Level of care: Med-Surg Status is: Inpatient Remains inpatient appropriate because: Vascular surgery is evaluating and planning to take the patient for an angiogram later today and will need further evaluation by hand surgery and maintenance of hemodialysis   Consultants:  Vascular Surgery Hand Surgery Nephrology  Procedures:  As delineated as above  Antimicrobials:  Anti-infectives (From admission, onward)    None       Subjective: Seen and examined at bedside was complaining about some hand pain.  Feels okay.  Feels a little hungry.  Denies chest pain or shortness of breath.  No other concerns or complaints at this time and awaiting to go down to the Cath Lab for an angiogram.  Objective: Vitals:   06/23/22 2026 06/24/22 0002 06/24/22 0457 06/24/22 0730  BP: (!) 126/53 (!) 132/59 109/60 134/67  Pulse: 72 68 67 70  Resp: 17 20 18 17   Temp: 98.2 F (36.8 C) 98.4 F (36.9 C) (!) 97.4 F (36.3 C) 98.1 F (36.7 C)  TempSrc: Oral Oral Oral Oral  SpO2: 98% 97% 95% (!) 89%  Weight:  94.2 kg    Height:        Intake/Output Summary (Last 24 hours) at 06/24/2022 1308 Last data filed at 06/24/2022 0900 Gross per 24 hour  Intake 100 ml  Output 0 ml  Net 100 ml   Filed Weights   06/23/22 1603 06/23/22 1709 06/24/22 0002  Weight: 93 kg 88.5 kg 94.2  kg   Examination: Physical Exam:  Constitutional: WN/WD obese Caucasian male in no acute distress appears calm seated into the bed but is complaining of some hand pain Respiratory: Diminished to auscultation bilaterally, no wheezing, rales, rhonchi or crackles. Normal respiratory effort and patient is not tachypenic. No accessory muscle use.  Unlabored breathing and not wearing any supplemental oxygen via nasal cannula Cardiovascular: RRR, no murmurs / rubs / gallops. S1 and S2 auscultated.  Minimal extremity edema Abdomen: Soft, non-tender, distended secondary to body habitus y. Bowel sounds positive.  GU: Deferred. Musculoskeletal: No clubbing / cyanosis of digits/nails. No joint deformity upper and lower extremities has a left arm AV fistula Skin: Right ring finger is necrotic appearing and black at the distal tip with an ulcer Neurologic: CN 2-12 grossly intact with no focal deficits.  Romberg sign and cerebellar reflexes not assessed.  Psychiatric: Normal judgment and insight. Alert and oriented x  3. Normal mood and appropriate affect.   Data Reviewed: I have personally reviewed following labs and imaging studies  CBC: Recent Labs  Lab 06/23/22 1727 06/23/22 1755 06/24/22 0122  WBC  --  8.1 8.5  HGB 8.5* 8.0* 8.0*  HCT 25.0* 26.2* 25.7*  MCV  --  102.3* 101.2*  PLT  --  177 193   Basic Metabolic Panel: Recent Labs  Lab 06/23/22 1727 06/23/22 1755 06/24/22 0122  NA 134* 134* 135  K 4.6 4.5 5.0  CL 100 97* 97*  CO2  --  26 25  GLUCOSE 127* 129* 92  BUN 52* 56* 59*  CREATININE 5.20* 4.99* 5.27*  CALCIUM  --  8.6* 8.9   GFR: Estimated Creatinine Clearance: 14 mL/min (A) (by C-G formula based on SCr of 5.27 mg/dL (H)). Liver Function Tests: Recent Labs  Lab 06/23/22 1755  AST 14*  ALT 12  ALKPHOS 138*  BILITOT 0.6  PROT 6.1*  ALBUMIN 2.5*   No results for input(s): "LIPASE", "AMYLASE" in the last 168 hours. No results for input(s): "AMMONIA" in the last 168  hours. Coagulation Profile: Recent Labs  Lab 06/23/22 1755  INR 1.3*   Cardiac Enzymes: No results for input(s): "CKTOTAL", "CKMB", "CKMBINDEX", "TROPONINI" in the last 168 hours. BNP (last 3 results) No results for input(s): "PROBNP" in the last 8760 hours. HbA1C: No results for input(s): "HGBA1C" in the last 72 hours. CBG: No results for input(s): "GLUCAP" in the last 168 hours. Lipid Profile: No results for input(s): "CHOL", "HDL", "LDLCALC", "TRIG", "CHOLHDL", "LDLDIRECT" in the last 72 hours. Thyroid Function Tests: No results for input(s): "TSH", "T4TOTAL", "FREET4", "T3FREE", "THYROIDAB" in the last 72 hours. Anemia Panel: No results for input(s): "VITAMINB12", "FOLATE", "FERRITIN", "TIBC", "IRON", "RETICCTPCT" in the last 72 hours. Sepsis Labs: Recent Labs  Lab 06/23/22 1755 06/23/22 2112  LATICACIDVEN 1.1 0.8    Recent Results (from the past 240 hour(s))  Surgical PCR screen     Status: None   Collection Time: 06/23/22  9:44 PM   Specimen: Nasal Mucosa; Nasal Swab  Result Value Ref Range Status   MRSA, PCR NEGATIVE NEGATIVE Final   Staphylococcus aureus NEGATIVE NEGATIVE Final    Comment: (NOTE) The Xpert SA Assay (FDA approved for NASAL specimens in patients 27 years of age and older), is one component of a comprehensive surveillance program. It is not intended to diagnose infection nor to guide or monitor treatment. Performed at [facility], [address] [place], [place] [address]     Radiology Studies: No results found.  Scheduled Meds:  buprenorphine  2 mg Sublingual QHS   busPIRone  10 mg Oral BID   escitalopram  20 mg Oral Daily   heparin  5,000 Units Subcutaneous Q8H   levothyroxine  250 mcg Oral QAC breakfast   melatonin  5 mg Oral QHS   pantoprazole  40 mg Oral Daily   Continuous Infusions:   LOS: 1 day   Marguerita Merles, DO [facility] Available via Epic secure chat 7am-7pm After these hours, please refer to coverage  provider listed on [email or web address] 06/24/2022, 1:08 PM

## 2022-06-24 NOTE — Consult Note (Signed)
Reason for Consult: To manage dialysis and dialysis related needs Referring Physician: Dr Payton Mccallum is an 72 y.o. male.  HPI: Pt is a 43M with ESRD on HD MWF at [facility], HTN, HLD DM II who is a resident of [facility] who presents with R hand/ finger pain and dry gangrene.    Pt is with his wife who helps provide the history.  Pt originally started dialysis via PD in 2020.  Had to be converted to HD after he went to [facility]- doing HD now 3x weekly.  PD catheter is still in place, hasn't been flushed, fibrin in it.  Per wife, no plans to go back to PD.    Here d/t progressive finger pain/ dry gangrene.  Has been evaluated by VVS- going for arteriogram today at 2 pm. Is adherent to HD.  TTS schedule normally.  There is no access in the R hand- the hand that has the gangrene.   Dialyzes at [facility] TTS Rest of records are forthcoming  Past Medical History:  Diagnosis Date   Back pain with radiation    Coronary artery disease    Depression    Diabetes mellitus without complication [facility])    Dialysis patient (HCC)    M, W, F   History of traumatic head injury    Hypertension    Renal disorder    Thyroid disease     Past Surgical History:  Procedure Laterality Date   ERCP      History reviewed. No pertinent family history.  Social History:  reports that he has never smoked. He has never used smokeless tobacco. He reports that he does not drink alcohol and does not use drugs.  Allergies:  Allergies  Allergen Reactions   Gabapentin     Cognitive impairment per spouse   Lipitor [Atorvastatin]     Severe muscle cramps per spouse   Pregabalin     Cognitive impairment. Dizziness.    Medications: Scheduled:  buprenorphine  2 mg Sublingual QHS   busPIRone  10 mg Oral BID   escitalopram  20 mg Oral Daily   heparin  5,000 Units Subcutaneous Q8H   levothyroxine  250 mcg Oral QAC breakfast   melatonin  5 mg Oral QHS   pantoprazole  40 mg Oral Daily      Results for orders placed or performed during the hospital encounter of 06/23/22 (from the past 48 hour(s))  I-stat chem 8, ED (not at [facility], [facility] or [facility])     Status: Abnormal   Collection Time: 06/23/22  5:27 PM  Result Value Ref Range   Sodium 134 (L) 135 - 145 mmol/L   Potassium 4.6 3.5 - 5.1 mmol/L   Chloride 100 98 - 111 mmol/L   BUN 52 (H) 8 - 23 mg/dL   Creatinine, Ser 1.61 (H) 0.61 - 1.24 mg/dL   Glucose, Bld 096 (H) 70 - 99 mg/dL    Comment: Glucose reference range applies only to samples taken after fasting for at least 8 hours.   Calcium, Ion 1.10 (L) 1.15 - 1.40 mmol/L   TCO2 25 22 - 32 mmol/L   Hemoglobin 8.5 (L) 13.0 - 17.0 g/dL   HCT 04.5 (L) 40.9 - 81.1 %  CBC     Status: Abnormal   Collection Time: 06/23/22  5:55 PM  Result Value Ref Range   WBC 8.1 4.0 - 10.5 K/uL   RBC 2.56 (L) 4.22 - 5.81 MIL/uL   Hemoglobin  8.0 (L) 13.0 - 17.0 g/dL   HCT 27.2 (L) 53.6 - 64.4 %   MCV 102.3 (H) 80.0 - 100.0 fL   MCH 31.3 26.0 - 34.0 pg   MCHC 30.5 30.0 - 36.0 g/dL   RDW 03.4 (H) 74.2 - 59.5 %   Platelets 177 150 - 400 K/uL   nRBC 0.0 0.0 - 0.2 %    Comment: Performed at [facility], [address], [place], [place] [address]  Lactic acid, plasma     Status: None   Collection Time: 06/23/22  5:55 PM  Result Value Ref Range   Lactic Acid, Venous 1.1 0.5 - 1.9 mmol/L    Comment: Performed at [facility], [address] [place], [place] [address]  Comprehensive metabolic panel     Status: Abnormal   Collection Time: 06/23/22  5:55 PM  Result Value Ref Range   Sodium 134 (L) 135 - 145 mmol/L   Potassium 4.5 3.5 - 5.1 mmol/L   Chloride 97 (L) 98 - 111 mmol/L   CO2 26 22 - 32 mmol/L   Glucose, Bld 129 (H) 70 - 99 mg/dL    Comment: Glucose reference range applies only to samples taken after fasting for at least 8 hours.   BUN 56 (H) 8 - 23 mg/dL   Creatinine, Ser 9.51 (H) 0.61 - 1.24 mg/dL   Calcium 8.6 (L) 8.9 - 10.3 mg/dL    Total Protein 6.1 (L) 6.5 - 8.1 g/dL   Albumin 2.5 (L) 3.5 - 5.0 g/dL   AST 14 (L) 15 - 41 U/L   ALT 12 0 - 44 U/L   Alkaline Phosphatase 138 (H) 38 - 126 U/L   Total Bilirubin 0.6 0.3 - 1.2 mg/dL   GFR, Estimated 12 (L) >60 mL/min    Comment: (NOTE) Calculated using the CKD-EPI Creatinine Equation (2021)    Anion gap 11 5 - 15    Comment: Performed at [facility], [address], [place], [place] [address]  Protime-INR     Status: Abnormal   Collection Time: 06/23/22  5:55 PM  Result Value Ref Range   Prothrombin Time 16.6 (H) 11.4 - 15.2 seconds   INR 1.3 (H) 0.8 - 1.2    Comment: (NOTE) INR goal varies based on device and disease states. Performed at [facility], [address], [place], [place] [address]   APTT     Status: None   Collection Time: 06/23/22  5:55 PM  Result Value Ref Range   aPTT 36 24 - 36 seconds    Comment: Performed at [facility], [address] [place], [place] [address]  Lactic acid, plasma     Status: None   Collection Time: 06/23/22  9:12 PM  Result Value Ref Range   Lactic Acid, Venous 0.8 0.5 - 1.9 mmol/L    Comment: Performed at [facility], [address] [place], [place] [address]  HIV Antibody (routine testing w rflx)     Status: None   Collection Time: 06/23/22  9:12 PM  Result Value Ref Range   HIV Screen 4th Generation wRfx Non Reactive Non Reactive    Comment: Performed at [facility], [address], [place], [place] [address]  Surgical PCR screen     Status: None   Collection Time: 06/23/22  9:44 PM   Specimen: Nasal Mucosa; Nasal Swab  Result Value Ref Range   MRSA, PCR NEGATIVE  NEGATIVE   Staphylococcus aureus NEGATIVE NEGATIVE    Comment: (NOTE) The Xpert SA Assay (FDA approved for NASAL specimens in patients 76 years of age and older), is one component of a comprehensive surveillance program. It is not intended to diagnose infection nor to guide or  monitor treatment. Performed at [facility], [address], [place], [place] [address]   Basic metabolic panel     Status: Abnormal   Collection Time: 06/24/22  1:22 AM  Result Value Ref Range   Sodium 135 135 - 145 mmol/L   Potassium 5.0 3.5 - 5.1 mmol/L   Chloride 97 (L) 98 - 111 mmol/L   CO2 25 22 - 32 mmol/L   Glucose, Bld 92 70 - 99 mg/dL    Comment: Glucose reference range applies only to samples taken after fasting for at least 8 hours.   BUN 59 (H) 8 - 23 mg/dL   Creatinine, Ser 6.30 (H) 0.61 - 1.24 mg/dL   Calcium 8.9 8.9 - 16.0 mg/dL   GFR, Estimated 11 (L) >60 mL/min    Comment: (NOTE) Calculated using the CKD-EPI Creatinine Equation (2021)    Anion gap 13 5 - 15    Comment: Performed at [facility], [address], [place], [place] [address]  CBC     Status: Abnormal   Collection Time: 06/24/22  1:22 AM  Result Value Ref Range   WBC 8.5 4.0 - 10.5 K/uL   RBC 2.54 (L) 4.22 - 5.81 MIL/uL   Hemoglobin 8.0 (L) 13.0 - 17.0 g/dL   HCT 35.5 (L) 73.2 - 20.2 %   MCV 101.2 (H) 80.0 - 100.0 fL   MCH 31.5 26.0 - 34.0 pg   MCHC 31.1 30.0 - 36.0 g/dL   RDW 54.2 (H) 70.6 - 23.7 %   Platelets 193 150 - 400 K/uL   nRBC 0.0 0.0 - 0.2 %    Comment: Performed at [facility], [address], [place], [place] [address]    No results found.  ROS: all other systems reviewed and are negative except as per HPI Blood pressure 134/67, pulse 70, temperature 98.1 F (36.7 C), temperature source Oral, resp. rate 17, height 5\' 6"  (1.676 m), weight 94.2 kg, SpO2 (!) 89 %. . GEN NAD, lying in bed HEENT EOMI PERRL NECK no JVD PULM clear CV RRR ABD soft EXT no LE edema NEURO slow to respond ACCESS: L RC AVF + T/B, PD catheter not taped up, has a dressing on exit site, fibrin in catheter  Assessment/Plan: 1 R finger/ hand gangrene: VVS following, for arteriogram today 2 ESRD: TTS normally.  HD tomorrow off schedule and then back on schedule Thursday.  D/w wife- in  agreement.  Additionally- PD catheter needs to be removed.  Will address this after arteriogram. 3 Hypertension/ volume: UF as able, appears grossly euvolemic 4. Anemia of ESRD: Hgb 8.0, will check iron studies, needs ESA likely 5. Metabolic Bone Disease: binders/ vitamins when eating 6.  Dispo: pending  Bufford Buttner 06/24/2022, 1:58 PM

## 2022-06-24 NOTE — TOC Initial Note (Signed)
Transition of Care [facility]) - Initial/Assessment Note    Patient Details  Name: Chase [name] MRN: [id number] Date of Birth: 25-Sep-1950  Transition of Care [facility]) CM/SW Contact:    Lawerance Sabal, RN Phone Number: 06/24/2022, 11:00 AM  Clinical Narrative:                  Patient admitted from [facility], ?LTC.  Pain and darkening of finger to right hand. Vasc consult, plan for angiogram today.   Expected Discharge Plan: Skilled Nursing Facility Barriers to Discharge: Continued Medical Work up   Patient Goals and CMS Choice            Expected Discharge Plan and Services In-house Referral: Clinical Social Work     Living arrangements for the past 2 months: Skilled Nursing Facility                                      Prior Living Arrangements/Services Living arrangements for the past 2 months: Skilled Nursing Facility                     Activities of Daily Living Home Assistive Devices/Equipment: CBG Meter, Environmental consultant (specify type), Shower chair with back [employer] wheel walker) ADL Screening (condition at time of admission) Patient's cognitive ability adequate to safely complete daily activities?: Yes Is the patient deaf or have difficulty hearing?: No Does the patient have difficulty seeing, even when wearing glasses/contacts?: No Does the patient have difficulty concentrating, remembering, or making decisions?: No Patient able to express need for assistance with ADLs?: Yes Does the patient have difficulty dressing or bathing?: No Independently performs ADLs?: Yes (appropriate for developmental age) Does the patient have difficulty walking or climbing stairs?: Yes Weakness of Legs: Both Weakness of Arms/Hands: Both  Permission Sought/Granted                  Emotional Assessment              Admission diagnosis:  Gangrene of finger of right hand [facility]) [I96] Patient Active Problem List   Diagnosis Date Noted   Gangrene of finger of right  hand (HCC) 06/23/2022   History of traumatic head injury 02/17/2022   Acute subdural hematoma (HCC) 10/25/2020   ESRD on peritoneal dialysis (HCC) 10/25/2020   Leukocytosis 10/25/2020   Fall at home, initial encounter 10/25/2020   Abnormal urinalysis 10/25/2020   Chronic pain 10/25/2020   Diabetes mellitus (HCC) 10/25/2020   Dialysis patient (HCC) 07/09/2020   Back pain 07/09/2020   PCP:  [name], MD Pharmacy:   [facility] [address] - [place], [place] - [address], [address] [place] [address] Phone: [phone] Fax: [phone]  [id number] - [place], [place] - [address] AT [address]OSS [address] [place] [address] Phone: [phone] Fax: [phone]     Social Determinants of Health (SDOH) Social History: SDOH Screenings   Food Insecurity: No Food Insecurity (06/23/2022)  Housing: Low Risk  (06/23/2022)  Transportation Needs: No Transportation Needs (06/23/2022)  Utilities: Not At Risk (06/23/2022)  Tobacco Use: Low Risk  (06/23/2022)   SDOH Interventions:     Readmission Risk Interventions     No data to display

## 2022-06-24 NOTE — Progress Notes (Signed)
  Progress Note    06/24/2022 4:10 PM * No surgery date entered *  Unfortunately the arteriogram for today needs to be postponed until 6/19 due to an emergency He will be rescheduled with Dr. Chestine Spore tomorrow Updated patient and his wife He is okay to resume his renal diet today Will need to be NPO after midnight tonight Will discuss with Nephrology new HD plans  Graceann Congress, [facility] [phone] 06/24/2022 4:10 PM

## 2022-06-24 NOTE — Plan of Care (Signed)

## 2022-06-24 NOTE — Hospital Course (Addendum)
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  He was admitted to [facility] from 5 1 until 05/13/2022 due to uremia from that time he was transition from PD to HD.  He is just from the hospital stay to nursing home regimen overall doing well per patient and his wife.  About 3 weeks ago he started having pain in his right hand for about 3 weeks and progressively gotten worse and he was sent to the ED on 06/04/2022 for complaints of right fingertip pain and at that time he had good capillary refill images reviewed from that day and he had an arterial Doppler on that day without significant stenosis or impaired blood flow.  He discharged back to the nursing facility but since then the pain has continued and worsened and he developed dark discoloration of the right ring finger on his right hand and ulcer.  Upon evaluation of the ED he is moderately hypertensive and lab work was done.  Vascular surgery and hand surgery was consulted and vascular surgery evaluated and are planning a angiogram with a transfemoral approach with a focus in the right upper extremity today to evaluate his inflow.  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 after midnight -ER provider discussed with Dr. Chestine Spore of vascular surgery, who plans angiogram in the morning, does not recommend any further imaging tonight -LA and WBC Recent Labs  Lab 06/04/22 1341 06/23/22 1755 06/23/22 1755 06/23/22 2112 06/24/22 0122  WBC 8.9 8.1  --   --   8.5  LATICACIDVEN  --  1.1   < > 0.8  --    < > = values in this interval not displayed.  -ER provider has also discussed this evening with Dr. Yehuda Budd of hand surgery, they will also formally consult in the morning -Pain control with Oxycodone 5 mg p.o. every 4 hours as needed  ESRD on HD TThSat -BUN/Cr Trend: Recent Labs  Lab 06/04/22 1341 06/23/22 1727 06/23/22 1755 06/24/22 0122  BUN 33* 52* 56* 59*  CREATININE 4.01* 5.20* 4.99* 5.27*  -No indication for Urgent Dialysis but Nephrology consulted for Maintenance of Dialysis  -Avoid Nephrotoxic Medications, Contrast Dyes, Hypotension and Dehydration to Ensure Adequate Renal Perfusion and will need to Renally Adjust Meds -Continue to Monitor and Trend Renal Function carefully and repeat CMP in the AM   Macrocytic Anemia/Anemia of Chronic Kidney Disease -Hgb/Hct Trend Recent Labs  Lab 06/04/22 1341 06/23/22 1727 06/23/22 1755 06/24/22 0122  HGB 7.7* 8.5* 8.0* 8.0*  HCT 25.7* 25.0* 26.2* 25.7*  MCV 101.6*  --  102.3* 101.2*  -Check Anemia Panel in the AM -Continue to Monitor for S/Sx of Bleeding; No overt bleeding noted -Repeat CBC in the AM  Chronic Back and Neck Pain -C/w Buprenorphine 2 mg sL at bedtime -C/w Oxycodone 5 mg po q4hprrn Moderate Paine and IV Fentanyl 50 mcg q1hprn Moderate Pain  Essential HTN -C/w Metoprolol Tartrate 5 mg IV q6hprn HBP -Continue to Monitor BP per Protocol  -Last BP reading was 134/67  Depression and Anxiety -C/w Buspirone 10 mg po BID and Escitalopram  20 mg po Daily  Hypothyroidism -Check TSH in the AM -C/w Levothyroxine 250 mcg po Daily  Hyponatremia -Na+ Trend: Recent Labs  Lab 06/04/22 1341 06/23/22 1727 06/23/22 1755 06/24/22 0122  NA 136 134* 134* 135  -Continue to Monitor and Trend and repeat CMP in the AM  GERD  -C/w PPI with Pantoprazole 40 mg po Daily  Hypoalbuminemia -Patient's Albumin Trend: Recent Labs  Lab 06/23/22 1755  ALBUMIN 2.5*  -Continue to  Monitor and Trend and repeat CMP in the AM  Obesity -Complicates overall prognosis and care -Estimated body mass index is 33.52 kg/m as calculated from the following:   Height as of this encounter: 5\' 6"  (1.676 m).   Weight as of this encounter: 94.2 kg.  -Weight Loss and Dietary Counseling given

## 2022-06-24 NOTE — Consult Note (Signed)
Orthopedic Hand Surgery Consultation:  Reason for Consult: Right hand dry gangrene  Referring Physician: Dr. Marland Mcalpine    HPI: Chase [name] is a(an) 72 y.o. male with PMH significant for ESRD on hemodialysis, DM2, HTN, HLD, hypothyroidism who presented to the ED on 06/23/2022 for worsening hand pain and discoloration of his index and ring fingers. He reports pain in the index through small fingers, but color changes only to the index and ring. He reports symptoms have been worsening over the last 3 weeks. He denies drainage, redness or fevers. No current access in the right arm.    Physical Exam: Right Upper Extremity Ischemia of the digits most pronounced in ring finger with evidence of dry gangrene, index finger with ischemic ulcer, pain with palpation of distal index, middle, ring and small fingers, no drainage, no erythema, fingers cool, no palpable wrist pulses      Assessment/Plan: 72 year old male with PMH ESRD on dialysis, DM, HTN, HLD with worsening right hand pain and ischemia/dry gangrene. He is scheduled for angiogram with vascular surgery this afternoon. Will await results. Ultimately may benefit from amputation of affected digits. For now, recommend keeping warm, elevated, no caffeine/chocolate/smoking.    Payton Mccallum PA-C Orthopaedic Hand Surgery  [facility] Office number: [phone] [address] [place], [place] [address]    Past Medical History:  Diagnosis Date   Back pain with radiation    Coronary artery disease    Depression    Diabetes mellitus without complication [facility])    Dialysis patient [facility])    M, W, F   History of traumatic head injury    Hypertension    Renal disorder    Thyroid disease     Past Surgical History:  Procedure Laterality Date   ERCP      History reviewed. No pertinent family history.  Social History:  reports that he has never smoked. He has never used smokeless tobacco. He reports that he does not drink  alcohol and does not use drugs.  Allergies:  Allergies  Allergen Reactions   Gabapentin     Cognitive impairment per spouse   Lipitor [Atorvastatin]     Severe muscle cramps per spouse   Pregabalin     Cognitive impairment. Dizziness.    Medications: reviewed, no changes to patient's home medications  Results for orders placed or performed during the hospital encounter of 06/23/22 (from the past 48 hour(s))  I-stat chem 8, ED (not at [facility], [facility] or [facility])     Status: Abnormal   Collection Time: 06/23/22  5:27 PM  Result Value Ref Range   Sodium 134 (L) 135 - 145 mmol/L   Potassium 4.6 3.5 - 5.1 mmol/L   Chloride 100 98 - 111 mmol/L   BUN 52 (H) 8 - 23 mg/dL   Creatinine, Ser 1.32 (H) 0.61 - 1.24 mg/dL   Glucose, Bld 440 (H) 70 - 99 mg/dL    Comment: Glucose reference range applies only to samples taken after fasting for at least 8 hours.   Calcium, Ion 1.10 (L) 1.15 - 1.40 mmol/L   TCO2 25 22 - 32 mmol/L   Hemoglobin 8.5 (L) 13.0 - 17.0 g/dL   HCT 10.2 (L) 72.5 - 36.6 %  CBC     Status: Abnormal   Collection Time: 06/23/22  5:55 PM  Result Value Ref Range   WBC 8.1 4.0 - 10.5 K/uL   RBC 2.56 (L) 4.22 - 5.81 MIL/uL   Hemoglobin 8.0 (L) 13.0 -  17.0 g/dL   HCT 16.1 (L) 09.6 - 04.5 %   MCV 102.3 (H) 80.0 - 100.0 fL   MCH 31.3 26.0 - 34.0 pg   MCHC 30.5 30.0 - 36.0 g/dL   RDW 40.9 (H) 81.1 - 91.4 %   Platelets 177 150 - 400 K/uL   nRBC 0.0 0.0 - 0.2 %    Comment: Performed at [facility], [address], [place], [place] [address]  Lactic acid, plasma     Status: None   Collection Time: 06/23/22  5:55 PM  Result Value Ref Range   Lactic Acid, Venous 1.1 0.5 - 1.9 mmol/L    Comment: Performed at [facility], [address] [place], [place] [address]  Comprehensive metabolic panel     Status: Abnormal   Collection Time: 06/23/22  5:55 PM  Result Value Ref Range   Sodium 134 (L) 135 - 145 mmol/L   Potassium 4.5 3.5 - 5.1 mmol/L    Chloride 97 (L) 98 - 111 mmol/L   CO2 26 22 - 32 mmol/L   Glucose, Bld 129 (H) 70 - 99 mg/dL    Comment: Glucose reference range applies only to samples taken after fasting for at least 8 hours.   BUN 56 (H) 8 - 23 mg/dL   Creatinine, Ser 8.65 (H) 0.61 - 1.24 mg/dL   Calcium 8.6 (L) 8.9 - 10.3 mg/dL   Total Protein 6.1 (L) 6.5 - 8.1 g/dL   Albumin 2.5 (L) 3.5 - 5.0 g/dL   AST 14 (L) 15 - 41 U/L   ALT 12 0 - 44 U/L   Alkaline Phosphatase 138 (H) 38 - 126 U/L   Total Bilirubin 0.6 0.3 - 1.2 mg/dL   GFR, Estimated 12 (L) >60 mL/min    Comment: (NOTE) Calculated using the CKD-EPI Creatinine Equation (2021)    Anion gap 11 5 - 15    Comment: Performed at [facility], [address], [place], [place] [address]  Protime-INR     Status: Abnormal   Collection Time: 06/23/22  5:55 PM  Result Value Ref Range   Prothrombin Time 16.6 (H) 11.4 - 15.2 seconds   INR 1.3 (H) 0.8 - 1.2    Comment: (NOTE) INR goal varies based on device and disease states. Performed at [facility], [address] [place], [place] [address]   APTT     Status: None   Collection Time: 06/23/22  5:55 PM  Result Value Ref Range   aPTT 36 24 - 36 seconds    Comment: Performed at [facility], [address] [place], [place] [address]  Lactic acid, plasma     Status: None   Collection Time: 06/23/22  9:12 PM  Result Value Ref Range   Lactic Acid, Venous 0.8 0.5 - 1.9 mmol/L    Comment: Performed at [facility], [address] [place], [place] [address]  HIV Antibody (routine testing w rflx)     Status: None   Collection Time: 06/23/22  9:12 PM  Result Value Ref Range   HIV Screen 4th Generation wRfx Non Reactive Non Reactive    Comment: Performed at [facility], [address], [place], [place] [address]  Surgical PCR screen     Status: None   Collection Time: 06/23/22  9:44 PM   Specimen: Nasal Mucosa; Nasal Swab  Result Value Ref Range    MRSA, PCR NEGATIVE NEGATIVE  Staphylococcus aureus NEGATIVE NEGATIVE    Comment: (NOTE) The Xpert SA Assay (FDA approved for NASAL specimens in patients 42 years of age and older), is one component of a comprehensive surveillance program. It is not intended to diagnose infection nor to guide or monitor treatment. Performed at [facility], [address] [place], [place] [address]   Basic metabolic panel     Status: Abnormal   Collection Time: 06/24/22  1:22 AM  Result Value Ref Range   Sodium 135 135 - 145 mmol/L   Potassium 5.0 3.5 - 5.1 mmol/L   Chloride 97 (L) 98 - 111 mmol/L   CO2 25 22 - 32 mmol/L   Glucose, Bld 92 70 - 99 mg/dL    Comment: Glucose reference range applies only to samples taken after fasting for at least 8 hours.   BUN 59 (H) 8 - 23 mg/dL   Creatinine, Ser 6.04 (H) 0.61 - 1.24 mg/dL   Calcium 8.9 8.9 - 54.0 mg/dL   GFR, Estimated 11 (L) >60 mL/min    Comment: (NOTE) Calculated using the CKD-EPI Creatinine Equation (2021)    Anion gap 13 5 - 15    Comment: Performed at [facility], [address], [place], [place] [address]  CBC     Status: Abnormal   Collection Time: 06/24/22  1:22 AM  Result Value Ref Range   WBC 8.5 4.0 - 10.5 K/uL   RBC 2.54 (L) 4.22 - 5.81 MIL/uL   Hemoglobin 8.0 (L) 13.0 - 17.0 g/dL   HCT 14.7 (L) 82.9 - 56.2 %   MCV 101.2 (H) 80.0 - 100.0 fL   MCH 31.5 26.0 - 34.0 pg   MCHC 31.1 30.0 - 36.0 g/dL   RDW 13.0 (H) 86.5 - 78.4 %   Platelets 193 150 - 400 K/uL   nRBC 0.0 0.0 - 0.2 %    Comment: Performed at [facility], [address], [place], [place] [address]    No results found.  ROS: 14 point review of systems negative except per HPI

## 2022-06-25 ENCOUNTER — Inpatient Hospital Stay (HOSPITAL_COMMUNITY)
Admission: EM | Disposition: A | Discharge status: Skilled Nursing Facility | Payer: Self-pay | Source: Skilled Nursing Facility | Attending: Internal Medicine

## 2022-06-25 DIAGNOSIS — F32A Depression, unspecified: Secondary | ICD-10-CM

## 2022-06-25 DIAGNOSIS — D638 Anemia in other chronic diseases classified elsewhere: Secondary | ICD-10-CM

## 2022-06-25 DIAGNOSIS — G8929 Other chronic pain: Secondary | ICD-10-CM

## 2022-06-25 DIAGNOSIS — N186 End stage renal disease: Secondary | ICD-10-CM

## 2022-06-25 DIAGNOSIS — I10 Essential (primary) hypertension: Secondary | ICD-10-CM | POA: Diagnosis not present

## 2022-06-25 DIAGNOSIS — E669 Obesity, unspecified: Secondary | ICD-10-CM

## 2022-06-25 DIAGNOSIS — Z992 Dependence on renal dialysis: Secondary | ICD-10-CM

## 2022-06-25 DIAGNOSIS — E039 Hypothyroidism, unspecified: Secondary | ICD-10-CM

## 2022-06-25 DIAGNOSIS — E871 Hypo-osmolality and hyponatremia: Secondary | ICD-10-CM

## 2022-06-25 DIAGNOSIS — K219 Gastro-esophageal reflux disease without esophagitis: Secondary | ICD-10-CM

## 2022-06-25 DIAGNOSIS — I96 Gangrene, not elsewhere classified: Secondary | ICD-10-CM | POA: Diagnosis not present

## 2022-06-25 HISTORY — PX: UPPER EXTREMITY ANGIOGRAPHY: CATH118270

## 2022-06-25 HISTORY — PX: PERIPHERAL VASCULAR BALLOON ANGIOPLASTY: CATH118281

## 2022-06-25 LAB — CBC WITH DIFFERENTIAL/PLATELET
Abs Immature Granulocytes: 0.02 10*3/uL (ref 0.00–0.07)
Basophils Absolute: 0 10*3/uL (ref 0.0–0.1)
Basophils Relative: 0 %
Eosinophils Absolute: 0.4 10*3/uL (ref 0.0–0.5)
Eosinophils Relative: 6 %
HCT: 23.9 % — ABNORMAL LOW (ref 39.0–52.0)
Hemoglobin: 7.3 g/dL — ABNORMAL LOW (ref 13.0–17.0)
Immature Granulocytes: 0 %
Lymphocytes Relative: 15 %
Lymphs Abs: 1 10*3/uL (ref 0.7–4.0)
MCH: 30.3 pg (ref 26.0–34.0)
MCHC: 30.5 g/dL (ref 30.0–36.0)
MCV: 99.2 fL (ref 80.0–100.0)
Monocytes Absolute: 0.5 10*3/uL (ref 0.1–1.0)
Monocytes Relative: 7 %
Neutro Abs: 5.1 10*3/uL (ref 1.7–7.7)
Neutrophils Relative %: 72 %
Platelets: 188 10*3/uL (ref 150–400)
RBC: 2.41 MIL/uL — ABNORMAL LOW (ref 4.22–5.81)
RDW: 16.5 % — ABNORMAL HIGH (ref 11.5–15.5)
WBC: 7 10*3/uL (ref 4.0–10.5)
nRBC: 0 % (ref 0.0–0.2)

## 2022-06-25 LAB — VITAMIN B12: Vitamin B-12: 894 pg/mL (ref 180–914)

## 2022-06-25 LAB — COMPREHENSIVE METABOLIC PANEL
ALT: 11 U/L (ref 0–44)
AST: 19 U/L (ref 15–41)
Albumin: 2.3 g/dL — ABNORMAL LOW (ref 3.5–5.0)
Alkaline Phosphatase: 113 U/L (ref 38–126)
Anion gap: 13 (ref 5–15)
BUN: 66 mg/dL — ABNORMAL HIGH (ref 8–23)
CO2: 22 mmol/L (ref 22–32)
Calcium: 8.3 mg/dL — ABNORMAL LOW (ref 8.9–10.3)
Chloride: 98 mmol/L (ref 98–111)
Creatinine, Ser: 6.44 mg/dL — ABNORMAL HIGH (ref 0.61–1.24)
GFR, Estimated: 9 mL/min — ABNORMAL LOW (ref >60)
Glucose, Bld: 88 mg/dL (ref 70–99)
Potassium: 5.2 mmol/L — ABNORMAL HIGH (ref 3.5–5.1)
Sodium: 133 mmol/L — ABNORMAL LOW (ref 135–145)
Total Bilirubin: 0.3 mg/dL (ref 0.3–1.2)
Total Protein: 5.8 g/dL — ABNORMAL LOW (ref 6.5–8.1)

## 2022-06-25 LAB — MAGNESIUM: Magnesium: 1.4 mg/dL — ABNORMAL LOW (ref 1.7–2.4)

## 2022-06-25 LAB — IRON AND TIBC
Iron: 19 ug/dL — ABNORMAL LOW (ref 45–182)
Saturation Ratios: 12 % — ABNORMAL LOW (ref 17.9–39.5)
TIBC: 164 ug/dL — ABNORMAL LOW (ref 250–450)
UIBC: 145 ug/dL

## 2022-06-25 LAB — FERRITIN: Ferritin: 793 ng/mL — ABNORMAL HIGH (ref 24–336)

## 2022-06-25 LAB — FOLATE: Folate: 11.8 ng/mL (ref >5.9)

## 2022-06-25 LAB — RETICULOCYTES
Immature Retic Fract: 20.8 % — ABNORMAL HIGH (ref 2.3–15.9)
RBC.: 2.39 MIL/uL — ABNORMAL LOW (ref 4.22–5.81)
Retic Count, Absolute: 77 10*3/uL (ref 19.0–186.0)
Retic Ct Pct: 3.2 % — ABNORMAL HIGH (ref 0.4–3.1)

## 2022-06-25 LAB — PHOSPHORUS: Phosphorus: 5.6 mg/dL — ABNORMAL HIGH (ref 2.5–4.6)

## 2022-06-25 SURGERY — UPPER EXTREMITY ANGIOGRAPHY
Anesthesia: LOCAL | Laterality: Right

## 2022-06-25 MED ORDER — HEPARIN SODIUM (PORCINE) 1000 UNIT/ML IJ SOLN
INTRAMUSCULAR | Status: AC
  Filled 2022-06-25: qty 10

## 2022-06-25 MED ORDER — LIDOCAINE HCL (PF) 1 % IJ SOLN
INTRAMUSCULAR | Status: DC | PRN
  Administered 2022-06-25: 15 mL

## 2022-06-25 MED ORDER — IODIXANOL 320 MG/ML IV SOLN
INTRAVENOUS | Status: DC | PRN
  Administered 2022-06-25: 160 mL

## 2022-06-25 MED ORDER — NITROGLYCERIN 1 MG/10 ML FOR IR/CATH LAB
INTRA_ARTERIAL | Status: DC | PRN
  Administered 2022-06-25 (×3): 200 ug via INTRA_ARTERIAL

## 2022-06-25 MED ORDER — ASPIRIN 81 MG PO CHEW
CHEWABLE_TABLET | ORAL | Status: AC
  Filled 2022-06-25: qty 1

## 2022-06-25 MED ORDER — ONDANSETRON HCL 4 MG/2ML IJ SOLN: 4.0000 mg | Freq: Four times a day (QID) | INTRAMUSCULAR | Status: DC | PRN

## 2022-06-25 MED ORDER — MIDAZOLAM HCL 2 MG/2ML IJ SOLN
INTRAMUSCULAR | Status: AC
  Filled 2022-06-25: qty 2

## 2022-06-25 MED ORDER — LIDOCAINE-PRILOCAINE 2.5-2.5 % EX CREA: 1.0000 | TOPICAL_CREAM | CUTANEOUS | Status: DC | PRN

## 2022-06-25 MED ORDER — ROSUVASTATIN CALCIUM 5 MG PO TABS
10.0000 mg | ORAL_TABLET | Freq: Every day | ORAL | Status: DC
  Administered 2022-06-25 – 2022-07-01 (×7): 10 mg via ORAL
  Filled 2022-06-25 (×7): qty 2

## 2022-06-25 MED ORDER — NITROGLYCERIN 1 MG/10 ML FOR IR/CATH LAB
INTRA_ARTERIAL | Status: AC
  Filled 2022-06-25: qty 10

## 2022-06-25 MED ORDER — HEPARIN SODIUM (PORCINE) 1000 UNIT/ML IJ SOLN
INTRAMUSCULAR | Status: DC | PRN
  Administered 2022-06-25 (×2): 5000 [IU] via INTRAVENOUS

## 2022-06-25 MED ORDER — LIDOCAINE HCL (PF) 1 % IJ SOLN
INTRAMUSCULAR | Status: AC
  Filled 2022-06-25: qty 30

## 2022-06-25 MED ORDER — LIDOCAINE HCL (PF) 1 % IJ SOLN: 5.0000 mL | INTRAMUSCULAR | Status: DC | PRN

## 2022-06-25 MED ORDER — FENTANYL CITRATE (PF) 100 MCG/2ML IJ SOLN
INTRAMUSCULAR | Status: AC
  Filled 2022-06-25: qty 2

## 2022-06-25 MED ORDER — DARBEPOETIN ALFA 100 MCG/0.5ML IJ SOSY
100.0000 ug | PREFILLED_SYRINGE | INTRAMUSCULAR | Status: DC
  Administered 2022-06-26: 100 ug via SUBCUTANEOUS
  Filled 2022-06-25: qty 0.5

## 2022-06-25 MED ORDER — ASPIRIN 81 MG PO TBEC
81.0000 mg | DELAYED_RELEASE_TABLET | Freq: Every day | ORAL | Status: DC
  Administered 2022-06-26 – 2022-07-02 (×7): 81 mg via ORAL
  Filled 2022-06-25 (×7): qty 1

## 2022-06-25 MED ORDER — SODIUM CHLORIDE 0.9% FLUSH: 3.0000 mL | INTRAVENOUS | Status: DC | PRN

## 2022-06-25 MED ORDER — HYDRALAZINE HCL 20 MG/ML IJ SOLN: 5.0000 mg | INTRAMUSCULAR | Status: DC | PRN

## 2022-06-25 MED ORDER — MIDAZOLAM HCL 2 MG/2ML IJ SOLN
INTRAMUSCULAR | Status: DC | PRN
  Administered 2022-06-25 (×2): 1 mg via INTRAVENOUS

## 2022-06-25 MED ORDER — PENTAFLUOROPROP-TETRAFLUOROETH EX AERO: 1.0000 | INHALATION_SPRAY | CUTANEOUS | Status: DC | PRN

## 2022-06-25 MED ORDER — HEPARIN SODIUM (PORCINE) 1000 UNIT/ML DIALYSIS: 1000.0000 [IU] | INTRAMUSCULAR | Status: DC | PRN

## 2022-06-25 MED ORDER — SODIUM CHLORIDE 0.9% FLUSH
3.0000 mL | Freq: Two times a day (BID) | INTRAVENOUS | Status: DC
  Administered 2022-06-25 – 2022-07-02 (×12): 3 mL via INTRAVENOUS

## 2022-06-25 MED ORDER — ACETAMINOPHEN 325 MG PO TABS: 650.0000 mg | ORAL_TABLET | ORAL | Status: DC | PRN

## 2022-06-25 MED ORDER — SODIUM CHLORIDE 0.9 % IV SOLN
250.0000 mg | Freq: Every day | INTRAVENOUS | Status: AC
  Administered 2022-06-26 – 2022-06-28 (×3): 250 mg via INTRAVENOUS
  Filled 2022-06-25 (×5): qty 20

## 2022-06-25 MED ORDER — ALTEPLASE 2 MG IJ SOLR: 2.0000 mg | Freq: Once | INTRAMUSCULAR | Status: DC | PRN

## 2022-06-25 MED ORDER — LABETALOL HCL 5 MG/ML IV SOLN: 10.0000 mg | INTRAVENOUS | Status: DC | PRN

## 2022-06-25 MED ORDER — HEPARIN (PORCINE) IN NACL 1000-0.9 UT/500ML-% IV SOLN
INTRAVENOUS | Status: DC | PRN
  Administered 2022-06-25 (×2): 500 mL

## 2022-06-25 MED ORDER — SODIUM CHLORIDE 0.9 % IV SOLN: 250.0000 mL | INTRAVENOUS | Status: DC | PRN

## 2022-06-25 MED ORDER — FENTANYL CITRATE (PF) 100 MCG/2ML IJ SOLN
INTRAMUSCULAR | Status: DC | PRN
  Administered 2022-06-25: 25 ug via INTRAVENOUS
  Administered 2022-06-25: 50 ug
  Administered 2022-06-25: 25 ug via INTRAVENOUS

## 2022-06-25 MED ORDER — ASPIRIN 81 MG PO CHEW
CHEWABLE_TABLET | ORAL | Status: DC | PRN
  Administered 2022-06-25: 81 mg via ORAL

## 2022-06-25 MED ORDER — ANTICOAGULANT SODIUM CITRATE 4% (200MG/5ML) IV SOLN: 5.0000 mL | Status: DC | PRN

## 2022-06-25 SURGICAL SUPPLY — 23 items
BALLN STERLI SL OTW 2.5X80X150 (BALLOONS) ×2
BALLN STERLING OTW 2X40X150 (BALLOONS) ×2
BALLOON STERLING OTW 2X40X150 (BALLOONS) IMPLANT
BALLOON STRLNG OTW 2.5X80X150 (BALLOONS) IMPLANT
CATH ANGIO 5F PIGTAIL 100CM (CATHETERS) IMPLANT
CATH HEADHUNTER 5F 125CM (CATHETERS) IMPLANT
CATH QUICKCROSS .018X135CM (MICROCATHETER) IMPLANT
DEVICE CLOSURE MYNXGRIP 5F (Vascular Products) IMPLANT
GLIDEWIRE ADV .035X260CM (WIRE) IMPLANT
KIT ENCORE 26 ADVANTAGE (KITS) IMPLANT
KIT MICROPUNCTURE NIT STIFF (SHEATH) IMPLANT
KIT PV (KITS) ×2 IMPLANT
MAT PREVALON FULL STRYKER (MISCELLANEOUS) IMPLANT
SHEATH CATAPULT 5FR 90 (SHEATH) IMPLANT
SHEATH PINNACLE 5F 10CM (SHEATH) IMPLANT
SHEATH PROBE COVER 6X72 (BAG) IMPLANT
STOPCOCK MORSE 400PSI 3WAY (MISCELLANEOUS) IMPLANT
SYR MEDRAD MARK 7 150ML (SYRINGE) ×2 IMPLANT
TRANSDUCER W/STOPCOCK (MISCELLANEOUS) ×2 IMPLANT
TRAY PV CATH (CUSTOM PROCEDURE TRAY) ×2 IMPLANT
TUBING CIL FLEX 10 FLL-RA (TUBING) IMPLANT
WIRE G V18X300CM (WIRE) IMPLANT
WIRE STARTER BENTSON 035X150 (WIRE) IMPLANT

## 2022-06-25 NOTE — Procedures (Signed)
Patient seen and examined on Hemodialysis. The procedure was supervised and I have made appropriate changes. BP 117/60 (BP Location: Right Arm)   Pulse 66   Temp 97.7 F (36.5 C)   Resp 16   Ht 5\' 6"  (1.676 m)   Wt 94.2 kg   SpO2 94%   BMI 33.52 kg/m   QB 400 mL/ min via L AVF, UF goal 2L  Tolerating treatment without complaints at this time.   Bufford Buttner MD [facility] [phone] 11:09 AM

## 2022-06-25 NOTE — Progress Notes (Signed)
[facility] Progress Note   Assessment/ Plan:   Assessment/Plan: 1 R finger/ hand gangrene: VVS following, for arteriogram 2 ESRD: TTS normally.  HD today off schedule and then back on schedule Thursday.  D/w wife- in agreement.  Additionally- PD catheter needs to be removed.  Will address this after arteriogram. 3 Hypertension/ volume: UF as able, appears grossly euvolemic 4. Anemia of ESRD: Hgb 8.0, iron deficient, iron studies ordered, give ferrlicet iron load, add aranesp for HD tomorrow 5. Metabolic Bone Disease: binders/ vitamins when eating 6.  Dispo: pending  Subjective:    Seen on dialysis.  No complaints.  For angio sometime today   Objective:   BP 117/60 (BP Location: Right Arm)   Pulse 66   Temp 97.7 F (36.5 C)   Resp 16   Ht 5\' 6"  (1.676 m)   Wt 94.2 kg   SpO2 94%   BMI 33.52 kg/m   Physical Exam: GEN NAD, lying in bed HEENT EOMI PERRL NECK no JVD PULM clear CV RRR ABD soft EXT no LE edema NEURO slow to respond ACCESS: L RC AVF + T/B, PD catheter not taped up, has a dressing on exit site, fibrin in catheter  Labs: BMET Recent Labs  Lab 06/23/22 1727 06/23/22 1755 06/24/22 0122 06/25/22 0259  NA 134* 134* 135 133*  K 4.6 4.5 5.0 5.2*  CL 100 97* 97* 98  CO2  --  26 25 22   GLUCOSE 127* 129* 92 88  BUN 52* 56* 59* 66*  CREATININE 5.20* 4.99* 5.27* 6.44*  CALCIUM  --  8.6* 8.9 8.3*  PHOS  --   --   --  5.6*   CBC Recent Labs  Lab 06/23/22 1727 06/23/22 1755 06/24/22 0122 06/25/22 0259  WBC  --  8.1 8.5 7.0  NEUTROABS  --   --   --  5.1  HGB 8.5* 8.0* 8.0* 7.3*  HCT 25.0* 26.2* 25.7* 23.9*  MCV  --  102.3* 101.2* 99.2  PLT  --  177 193 188      Medications:     buprenorphine  2 mg Sublingual QHS   busPIRone  10 mg Oral BID   Chlorhexidine Gluconate Cloth  6 each Topical Q0600   escitalopram  20 mg Oral Daily   heparin  5,000 Units Subcutaneous Q8H   levothyroxine  250 mcg Oral QAC breakfast   melatonin  5 mg Oral  QHS   pantoprazole  40 mg Oral Daily     Bufford Buttner, MD 06/25/2022, 11:04 AM

## 2022-06-25 NOTE — Op Note (Signed)
Patient name: Chase [name] MRN: [id number] DOB: 1950-07-02 Sex: male  06/25/2022 Pre-operative Diagnosis: Critical limb ischemia of the right upper extremity with tissue loss with gangrene of the fingertips Post-operative diagnosis:  Same Surgeon:  Cephus Shelling, MD Procedure Performed: 1.  Ultrasound-guided access right common femoral artery 2.  Limited arch aortogram 3.  Catheter selection of right axillary artery with dedicated right upper extremity arteriogram and catheter selection of right radial artery 4.  Right radial artery angioplasty (2.5 mm x 80 mm Sterling) for high grade 90% stenosis 5.  Mynx closure of the right common femoral artery 6.  35 minutes of monitored moderate conscious sedation time  Indications: 72 year old male with end-stage renal disease that vascular surgery was consulted for ischemic changes to the right fingertips on Monday night.  He presents for arch aortogram with right upper extremity arteriogram and possible intervention after risks benefits discussed.  Findings:   Ultrasound-guided access right common femoral artery.  Arch aortogram showed patent arch vessels including innominate and right subclavian artery.  We then cannulated the innominate with a headhunter catheter and got a wire out into the right axillary artery and advanced our catheter into the right axillary artery.  Injections of the right upper extremity showed widely patent brachial artery.  He has significant disease below the elbow.  The radial artery was heavily calcified and tortuous and had a high-grade 90% stenosis in the proximal vessel.  There was also a second approximate 50% stenosis at the wrist.  Interosseous was patent.  The ulnar artery was diffusely diseased calcified and small and occluded at the wrist with no filling of the palmar arch.  Significant small vessel disease in the hand with no digital vessels identified.   From right transfemoral access, I was able to  angioplasty the proximal right radial artery stenosis with a 2.5 mm x 80 mm Sterling with excellent results and widely patent proximal vessel.  We gave nitroglycerin throughout the case.  Tried to get down to the radial artery disease at the wrist into the hand and we did not have any balloon shaft long enough to reach lesion.  Brisk right radial artery signal at completion with preserved runoff into hand.   Procedure:  The patient was identified in the holding area and taken to room 8.  The patient was then placed supine on the table and prepped and draped in the usual sterile fashion.  A time out was called.  Ultrasound was used to evaluate the right common femoral artery.  It was patent .  A digital ultrasound image was acquired.  A micropuncture needle was used to access the right common femoral artery under ultrasound guidance.  An 018 wire was advanced without resistance and a micropuncture sheath was placed.  The 018 wire was removed and a benson wire was placed.  Pigtail catheter was advanced into the ascending aorta and an arch aortogram was given.  Prior to this we did give 5000 units of IV heparin.  After the arch aortogram used a headhunter catheter with a Glidewire advantage to cannulate the innominate I got my wire out through the subclavian and advanced the catheter into the right axillary artery.  We then gave nitro and got dedicated right upper extremity angiogram images down to the hand.  Pertinent findings are noted above.  He has significant disease distal to the brachial artery in the forearm and hand.  Ultimately I did not think intervention on the ulnar artery was an  option as this essentially occluded at the wrist with no filling of the palmar arch. I was able to get into the radial artery with a V18 and quick cross catheter in the proximal vessel and got dedicated hand injection imaging.  I then performed angioplasty of the proximal vessel with a 2.5 mm x 80 mm Sterling to nominal pressure  for 2 minutes for the high-grade stenosis.  I was able to get my wire distally across the wrist for the second lesion but the balloon would not reach the lesion and this was the longest shaft length we had.  Essentially wires and catheters were removed and we put a short 5 French sheath right groin with a mynx closure device  Findings: Good results after angioplasty of high-grade proximal right radial stenosis at this is dominant runoff into the hand.  He still has significant small vessel disease in the hand and ulnar artery occludes at the wrist and is diminutive and diseased.  Aspirin statin for now.  Will start Plavix when okay from hand surgery.  Cephus Shelling, MD [facility] Office: [phone]   Cephus Shelling

## 2022-06-25 NOTE — Progress Notes (Signed)
   06/25/22 1337  Vitals  Temp 97.8 F (36.6 C)  Pulse Rate 87  Resp 18  BP (!) 143/62  SpO2 95 %  O2 Device Room Air  Weight 92.2 kg  Type of Weight Post-Dialysis  Post Treatment  Dialyzer Clearance Lightly streaked  Duration of HD Treatment -hour(s) 3.5 hour(s)  Hemodialysis Intake (mL) 0 mL  Liters Processed 84  Fluid Removed (mL) 2000 mL  Tolerated HD Treatment Yes  AVG/AVF Arterial Site Held (minutes) 10 minutes  AVG/AVF Venous Site Held (minutes) 10 minutes   Received patient in bed to unit.  Alert and oriented.  Informed consent signed and in chart.  3.5  Patient tolerated well.  Transported back to the room  Alert, without acute distress.  Hand-off given to patient's nurse.   Access used: LUAF Access issues: no complications  Total UF removed: 2000 Medication(s) given: none    [name]

## 2022-06-25 NOTE — Progress Notes (Signed)
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  He was admitted to [facility] from 5 /1 until 05/13/2022 due to uremia from that time he was transitioned from PD to HD. He is just from the hospital stay to nursing home regimen overall doing well per patient and his wife.  -About 3 weeks ago he started having pain in his right hand  and progressively gotten worse and he was sent to the ED on 06/04/2022 for complaints of right fingertip pain and at that time he had good capillary refill images reviewed from that day and he had an arterial Doppler on that day without significant stenosis or impaired blood flow. He discharged back to the nursing facility but since then the pain has continued and worsened and he developed dark discoloration of the right ring finger on his right hand and ulcer. Upon evaluation of the ED he is moderately hypertensive and lab work was done. Vascular surgery and hand surgery was consulted and vascular surgery evaluated and are planning a angiogram with a transfemoral approach with a focus in the right upper extremity to evaluate his inflow.    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 -Patient for arch aortogram with right upper extremity arteriogram and possible intervention from a transfemoral approach for gangrene of the right fingers per vascular surgery today. -Hand surgeon following. -Supportive care, pain management.  2.  ESRD on HD TTS -Patient seen in consultation by nephrology, underwent hemodialysis today and will be back on regular hemodialysis schedule tomorrow. -Per nephrology.  3.  Chronic back and neck pain -Continue home regimen buprenorphine 2 mg SL at bedtime, oxycodone 5 mg p.o. every 4 hours as needed moderate pain and IV fentanyl 50 mcg every hour as needed moderate pain.  4.  Hypertension -Currently stable. -Demadex on hold.  5.  Hypothyroidism -Continue home regimen Synthroid.  6.  Depression/anxiety -Continue BuSpar and Lexapro.  7.  Anemia of chronic kidney disease -Hemoglobin stable at 7.3. -Anemia panel with iron level of 19, TIBC of 164, ferritin of 793, folate of 11.8. -Ferrlecit iron load ordered per nephrology today, aranesp for HD tomorrow per nephrology.  8.  GERD PPI.  9.  Obesity -BMI 33.52 kg/m. -Lifestyle modification -Outpatient follow-up with PCP.  10.  Hyponatremia -On HD. -Stable.  11.  Hypomagnesemia -Magnesium of 1.4. -Patient currently on hemodialysis. -Repeat labs in the AM.   DVT prophylaxis: Heparin Code Status: Full Family Communication: Updated patient.  No family at bedside. Disposition: TBD  Status is: Inpatient Remains inpatient appropriate because: Severity of illness   Consultants:  Hand surgeon/orthopedics: Dr. Yehuda Budd 06/24/2022 Vascular surgery: Dr. Chestine Spore 06/23/2022 Nephrology: Dr. Signe Colt 06/24/2022  Procedures:  None  Antimicrobials:  Anti-infectives (From admission, onward)    None         Subjective: Patient in hemodialysis.  No chest pain.  No  shortness of breath.  No abdominal pain.  Complains of right index and ring finger pain.  Objective: Vitals:   06/25/22 1337 06/25/22 1400 06/25/22 1539 06/25/22 1557  BP: (!) 143/62 115/66    Pulse: 87     Resp: 18 16    Temp: 97.8 F (36.6 C)     TempSrc:      SpO2: 95%  91% 93%  Weight: 92.2 kg     Height:        Intake/Output Summary (Last 24 hours) at 06/25/2022 1724 Last data filed at 06/25/2022 1337 Gross per 24 hour  Intake 0 ml  Output 2000 ml  Net -2000 ml   Filed Weights   06/24/22 0002 06/25/22 0850 06/25/22 1337  Weight: 94.2 kg 94.2 kg 92.2 kg    Examination:  General exam: Appears calm and comfortable  Respiratory system: Clear to auscultation anterior lung fields.  No wheezes, no crackles, no rhonchi.Marland Kitchen Respiratory effort normal. Cardiovascular system: S1 & S2 heard, RRR. No JVD, murmurs, rubs, gallops or clicks. No pedal edema. Gastrointestinal system: Abdomen is nondistended, soft and nontender. No organomegaly or masses felt. Normal bowel sounds heard. Central nervous system: Alert and oriented. No focal neurological deficits. Extremities: Right ring finger with dry gangrene with some streaking erythema up the hand.  Right index finger with some necrotic tissue on the tip..  Symmetric 5 x 5 power. Skin: No rashes, lesions or ulcers Psychiatry: Judgement and insight appear normal. Mood & affect appropriate.     Data Reviewed: I have personally reviewed following labs and imaging studies  CBC: Recent Labs  Lab 06/23/22 1727 06/23/22 1755 06/24/22 0122 06/25/22 0259  WBC  --  8.1 8.5 7.0  NEUTROABS  --   --   --  5.1  HGB 8.5* 8.0* 8.0* 7.3*  HCT 25.0* 26.2* 25.7* 23.9*  MCV  --  102.3* 101.2* 99.2  PLT  --  177 193 188    Basic Metabolic Panel: Recent Labs  Lab 06/23/22 1727 06/23/22 1755 06/24/22 0122 06/25/22 0259  NA 134* 134* 135 133*  K 4.6 4.5 5.0 5.2*  CL 100 97* 97* 98  CO2  --  26 25 22   GLUCOSE 127* 129* 92 88  BUN 52* 56*  59* 66*  CREATININE 5.20* 4.99* 5.27* 6.44*  CALCIUM  --  8.6* 8.9 8.3*  MG  --   --   --  1.4*  PHOS  --   --   --  5.6*    GFR: Estimated Creatinine Clearance: 11.4 mL/min (A) (by C-G formula based on SCr of 6.44 mg/dL (H)).  Liver Function Tests: Recent Labs  Lab 06/23/22 1755 06/25/22 0259  AST 14* 19  ALT 12 11  ALKPHOS 138* 113  BILITOT 0.6 0.3  PROT 6.1* 5.8*  ALBUMIN 2.5* 2.3*    CBG: No results for input(s): "GLUCAP" in the last 168 hours.   Recent Results (from the past 240 hour(s))  Surgical PCR screen     Status: None   Collection Time: 06/23/22  9:44 PM   Specimen: Nasal Mucosa; Nasal Swab  Result Value Ref Range Status   MRSA, PCR NEGATIVE NEGATIVE Final   Staphylococcus aureus NEGATIVE NEGATIVE Final  Comment: (NOTE) The Xpert SA Assay (FDA approved for NASAL specimens in patients 34 years of age and older), is one component of a comprehensive surveillance program. It is not intended to diagnose infection nor to guide or monitor treatment. Performed at [facility], [address] [place], [place] [address]          Radiology Studies: No results found.      Scheduled Meds:  [MAR Hold] buprenorphine  2 mg Sublingual QHS   [MAR Hold] busPIRone  10 mg Oral BID   [MAR Hold] Chlorhexidine Gluconate Cloth  6 each Topical Q0600   [MAR Hold] darbepoetin (ARANESP) injection - DIALYSIS  100 mcg Subcutaneous Q Thu-1800   [MAR Hold] escitalopram  20 mg Oral Daily   [MAR Hold] heparin  5,000 Units Subcutaneous Q8H   [MAR Hold] levothyroxine  250 mcg Oral QAC breakfast   [MAR Hold] melatonin  5 mg Oral QHS   nitroGLYCERIN       [MAR Hold] pantoprazole  40 mg Oral Daily   Continuous Infusions:  [MAR Hold] ferric gluconate (FERRLECIT) IVPB       LOS: 2 days    Time spent: 40 minutes    Ramiro Harvest, MD [facility]   To contact the attending provider between 7A-7P or the covering provider during after hours 7P-7A,  please log into the web site [email or web address] and access using universal [place] password for that web site. If you do not have the password, please call the hospital operator.  06/25/2022, 5:24 PM

## 2022-06-25 NOTE — Progress Notes (Signed)
VAST consulted to obtain IV access. Pt located in holding area after coming out of cath lab. Patient with vascular surgery to right arm for gangrenous finger. Previous IV located in right arm began leaking and was dc'd per Cath lab RN.  Pt with AVF to left lower arm without suitable veins in left hand for IV access. Sherald Hess, MD contacted via phone by VAST RN; telephone order obtained to place IV access in right arm.  USGIV placed in right posterior forearm without difficulty; good blood return and flushed easily.

## 2022-06-25 NOTE — Progress Notes (Addendum)
Patient arrived from cath lab to 4Eo5 patient with rt groin level 0 and was able to obtain doppler pulse wrist. Vital signs obtained an CCmd made aware patient on monitor.  Rojean Ige, Randall An RN

## 2022-06-25 NOTE — Progress Notes (Addendum)
Received patient from 5N alert and oriented X4, , skin warm and dry, resp even and unlabored.  Pt states he has a headache.  Pt on monitor and consent signed, waiting for procedure.  Pt refuse anything to the for his headache at this time

## 2022-06-25 NOTE — Progress Notes (Signed)
[facility]   Subjective  - no complaints   Objective 115/66 87 97.8 F (36.6 C) 16 95%  Intake/Output Summary (Last 24 hours) at 06/25/2022 1453 Last data filed at 06/25/2022 1337 Gross per 24 hour  Intake 120 ml  Output 2000 ml  Net -1880 ml      Laboratory Lab Results: Recent Labs    06/24/22 0122 06/25/22 0259  WBC 8.5 7.0  HGB 8.0* 7.3*  HCT 25.7* 23.9*  PLT 193 188   BMET Recent Labs    06/24/22 0122 06/25/22 0259  NA 135 133*  K 5.0 5.2*  CL 97* 98  CO2 25 22  GLUCOSE 92 88  BUN 59* 66*  CREATININE 5.27* 6.44*  CALCIUM 8.9 8.3*    COAG Lab Results  Component Value Date   INR 1.3 (H) 06/23/2022   INR 1.1 12/15/2021   INR 1.0 10/25/2020   No results found for: "PTT"  Assessment/Planning:  Plan arch aortogram with right upper extremity arteriogram with possible intervention from a transfemoral approach for gangrene of the right fingers.  Appreciate hand surgery seeing him.  Has a brachial pulse on exam but will evaluate for any significant inflow stenosis.  Cephus Shelling 06/25/2022 2:53 PM --

## 2022-06-26 ENCOUNTER — Encounter (HOSPITAL_COMMUNITY): Payer: Self-pay | Admitting: Vascular Surgery

## 2022-06-26 DIAGNOSIS — Z992 Dependence on renal dialysis: Secondary | ICD-10-CM | POA: Diagnosis not present

## 2022-06-26 DIAGNOSIS — N186 End stage renal disease: Secondary | ICD-10-CM | POA: Diagnosis not present

## 2022-06-26 DIAGNOSIS — E039 Hypothyroidism, unspecified: Secondary | ICD-10-CM | POA: Diagnosis not present

## 2022-06-26 DIAGNOSIS — I96 Gangrene, not elsewhere classified: Secondary | ICD-10-CM | POA: Diagnosis not present

## 2022-06-26 DIAGNOSIS — I10 Essential (primary) hypertension: Secondary | ICD-10-CM | POA: Diagnosis not present

## 2022-06-26 LAB — LIPID PANEL
Cholesterol: 85 mg/dL (ref 0–200)
HDL: 27 mg/dL — ABNORMAL LOW (ref >40)
LDL Cholesterol: 39 mg/dL (ref 0–99)
Total CHOL/HDL Ratio: 3.1 RATIO
Triglycerides: 96 mg/dL (ref <150)
VLDL: 19 mg/dL (ref 0–40)

## 2022-06-26 LAB — CBC WITH DIFFERENTIAL/PLATELET
Abs Immature Granulocytes: 0.01 10*3/uL (ref 0.00–0.07)
Basophils Absolute: 0 10*3/uL (ref 0.0–0.1)
Basophils Relative: 1 %
Eosinophils Absolute: 0.3 10*3/uL (ref 0.0–0.5)
Eosinophils Relative: 6 %
HCT: 23.1 % — ABNORMAL LOW (ref 39.0–52.0)
Hemoglobin: 7 g/dL — ABNORMAL LOW (ref 13.0–17.0)
Immature Granulocytes: 0 %
Lymphocytes Relative: 17 %
Lymphs Abs: 1 10*3/uL (ref 0.7–4.0)
MCH: 30.3 pg (ref 26.0–34.0)
MCHC: 30.3 g/dL (ref 30.0–36.0)
MCV: 100 fL (ref 80.0–100.0)
Monocytes Absolute: 0.5 10*3/uL (ref 0.1–1.0)
Monocytes Relative: 9 %
Neutro Abs: 3.8 10*3/uL (ref 1.7–7.7)
Neutrophils Relative %: 67 %
Platelets: 163 10*3/uL (ref 150–400)
RBC: 2.31 MIL/uL — ABNORMAL LOW (ref 4.22–5.81)
RDW: 16.3 % — ABNORMAL HIGH (ref 11.5–15.5)
WBC: 5.7 10*3/uL (ref 4.0–10.5)
nRBC: 0 % (ref 0.0–0.2)

## 2022-06-26 LAB — MAGNESIUM: Magnesium: 1.4 mg/dL — ABNORMAL LOW (ref 1.7–2.4)

## 2022-06-26 LAB — RENAL FUNCTION PANEL
Albumin: 2.1 g/dL — ABNORMAL LOW (ref 3.5–5.0)
Anion gap: 13 (ref 5–15)
BUN: 32 mg/dL — ABNORMAL HIGH (ref 8–23)
CO2: 27 mmol/L (ref 22–32)
Calcium: 8.3 mg/dL — ABNORMAL LOW (ref 8.9–10.3)
Chloride: 97 mmol/L — ABNORMAL LOW (ref 98–111)
Creatinine, Ser: 4.13 mg/dL — ABNORMAL HIGH (ref 0.61–1.24)
GFR, Estimated: 15 mL/min — ABNORMAL LOW (ref >60)
Glucose, Bld: 84 mg/dL (ref 70–99)
Phosphorus: 4.4 mg/dL (ref 2.5–4.6)
Potassium: 4.2 mmol/L (ref 3.5–5.1)
Sodium: 137 mmol/L (ref 135–145)

## 2022-06-26 LAB — BPAM RBC: Unit Type and Rh: 5100

## 2022-06-26 LAB — HEPATITIS B SURFACE ANTIBODY, QUANTITATIVE: Hep B S AB Quant (Post): 9.9 m[IU]/mL — ABNORMAL LOW (ref >9.9)

## 2022-06-26 LAB — TYPE AND SCREEN
Antibody Screen: NEGATIVE
Unit division: 0

## 2022-06-26 LAB — HEMOGLOBIN AND HEMATOCRIT, BLOOD
HCT: 28.4 % — ABNORMAL LOW (ref 39.0–52.0)
Hemoglobin: 8.8 g/dL — ABNORMAL LOW (ref 13.0–17.0)

## 2022-06-26 LAB — ABO/RH: ABO/RH(D): O POS

## 2022-06-26 LAB — PREPARE RBC (CROSSMATCH)

## 2022-06-26 MED ORDER — MAGNESIUM SULFATE 2 GM/50ML IV SOLN
2.0000 g | Freq: Once | INTRAVENOUS | Status: AC
  Administered 2022-06-26: 2 g via INTRAVENOUS
  Filled 2022-06-26: qty 50

## 2022-06-26 MED ORDER — CLOPIDOGREL BISULFATE 75 MG PO TABS
75.0000 mg | ORAL_TABLET | Freq: Every day | ORAL | Status: DC
  Administered 2022-06-26 – 2022-07-02 (×7): 75 mg via ORAL
  Filled 2022-06-26 (×7): qty 1

## 2022-06-26 MED ORDER — CALCITRIOL 0.25 MCG PO CAPS
0.2500 ug | ORAL_CAPSULE | Freq: Every day | ORAL | Status: DC
  Administered 2022-06-26 – 2022-07-02 (×7): 0.25 ug via ORAL
  Filled 2022-06-26 (×7): qty 1

## 2022-06-26 MED ORDER — HYDROXYZINE HCL 10 MG PO TABS: 10.0000 mg | ORAL_TABLET | Freq: Three times a day (TID) | ORAL | Status: DC | PRN

## 2022-06-26 MED ORDER — CALCIUM ACETATE (PHOS BINDER) 667 MG PO CAPS
667.0000 mg | ORAL_CAPSULE | Freq: Three times a day (TID) | ORAL | Status: DC
  Administered 2022-06-26 – 2022-07-02 (×10): 667 mg via ORAL
  Filled 2022-06-26 (×10): qty 1

## 2022-06-26 MED ORDER — DIPHENHYDRAMINE HCL 25 MG PO CAPS
25.0000 mg | ORAL_CAPSULE | Freq: Once | ORAL | Status: AC
  Administered 2022-06-26: 25 mg via ORAL
  Filled 2022-06-26: qty 1

## 2022-06-26 MED ORDER — SODIUM CHLORIDE 0.9% IV SOLUTION: Freq: Once | INTRAVENOUS | Status: AC

## 2022-06-26 MED ORDER — ACETAMINOPHEN 325 MG PO TABS
650.0000 mg | ORAL_TABLET | Freq: Once | ORAL | Status: AC
  Administered 2022-06-26: 650 mg via ORAL
  Filled 2022-06-26: qty 2

## 2022-06-26 MED FILL — Fentanyl Citrate Preservative Free (PF) Inj 100 MCG/2ML: INTRAMUSCULAR | Qty: 2 | Status: AC

## 2022-06-26 NOTE — Progress Notes (Signed)
Patient to receive 1 unit of blood per nephrology on 06-26-22

## 2022-06-26 NOTE — Care Management Important Message (Signed)
Important Message  Patient Details  Name: Chase [name] MRN: [id number] Date of Birth: 1950/04/21   Medicare Important Message Given:  Yes     Renie Ora 06/26/2022, 8:43 AM

## 2022-06-26 NOTE — Progress Notes (Signed)
Pt receives out-pt HD at [facility] on TTS 11:30 am chair time. Will assist as needed.   Olivia Canter Renal Navigator [phone]

## 2022-06-26 NOTE — Progress Notes (Signed)
Assisted pt to [facility] to urinate, pt w/ very bloody urine. No clots. Pt states this is baseline for him. Cleaned up, will continue to monitor.

## 2022-06-26 NOTE — Progress Notes (Addendum)
[facility] Progress Note   Assessment/ Plan:   Assessment/Plan: 1 R finger/ hand gangrene: VVS followings/p angio with PTA to R radial artery.  Needs hand surgery c/s 2 ESRD: TTS normally.  HD Wednesday.  Next HD Friday off schedule and then back to normal schedule Saturday. 3 Hypertension/ volume: UF as able, appears grossly euvolemic 4. Anemia of ESRD: Hgb 8.0, on Aranesp and ferrlicet, Hgb 7.0, will get blood today too 5. Metabolic Bone Disease: binders/ vitamins when eating 6.  PD catheter- will coordinate VVS to see when would be a good time for removal. 6.  Dispo: pending  Subjective:    S/p angio with PTA to R radial artery.  Sitting in room today, no complaints.     Objective:   BP (!) 135/91 (BP Location: Right Leg)   Pulse 70   Temp 98.5 F (36.9 C) (Oral)   Resp 12   Ht 5\' 6"  (1.676 m)   Wt 92.2 kg   SpO2 92%   BMI 32.81 kg/m   Physical Exam: GEN NAD, lying in bed HEENT EOMI PERRL NECK no JVD PULM clear CV RRR ABD soft EXT no LE edema NEURO slow to respond ACCESS: L RC AVF + T/B, PD catheter not taped up, has a dressing on exit site, fibrin in catheter  Labs: BMET Recent Labs  Lab 06/23/22 1727 06/23/22 1755 06/24/22 0122 06/25/22 0259 06/26/22 0127  NA 134* 134* 135 133* 137  K 4.6 4.5 5.0 5.2* 4.2  CL 100 97* 97* 98 97*  CO2  --  26 25 22 27   GLUCOSE 127* 129* 92 88 84  BUN 52* 56* 59* 66* 32*  CREATININE 5.20* 4.99* 5.27* 6.44* 4.13*  CALCIUM  --  8.6* 8.9 8.3* 8.3*  PHOS  --   --   --  5.6* 4.4   CBC Recent Labs  Lab 06/23/22 1755 06/24/22 0122 06/25/22 0259 06/26/22 0127  WBC 8.1 8.5 7.0 5.7  NEUTROABS  --   --  5.1 3.8  HGB 8.0* 8.0* 7.3* 7.0*  HCT 26.2* 25.7* 23.9* 23.1*  MCV 102.3* 101.2* 99.2 100.0  PLT 177 193 188 163      Medications:     sodium chloride   Intravenous Once   acetaminophen  650 mg Oral Once   aspirin EC  81 mg Oral Daily   buprenorphine  2 mg Sublingual QHS   busPIRone  10 mg Oral  BID   calcitRIOL  0.25 mcg Oral Daily   calcium acetate  667 mg Oral TID WC   Chlorhexidine Gluconate Cloth  6 each Topical Q0600   clopidogrel  75 mg Oral Daily   darbepoetin (ARANESP) injection - DIALYSIS  100 mcg Subcutaneous Q Thu-1800   diphenhydrAMINE  25 mg Oral Once   escitalopram  20 mg Oral Daily   heparin  5,000 Units Subcutaneous Q8H   levothyroxine  250 mcg Oral QAC breakfast   melatonin  5 mg Oral QHS   pantoprazole  40 mg Oral Daily   rosuvastatin  10 mg Oral QHS   sodium chloride flush  3 mL Intravenous Q12H     Chase Buttner, MD 06/26/2022, 10:44 AM

## 2022-06-26 NOTE — Progress Notes (Signed)
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  He was admitted to [facility] from 5 /1 until 05/13/2022 due to uremia from that time he was transitioned from PD to HD. He is just from the hospital stay to nursing home regimen overall doing well per patient and his wife.  -About 3 weeks ago he started having pain in his right hand  and progressively gotten worse and he was sent to the ED on 06/04/2022 for complaints of right fingertip pain and at that time he had good capillary refill images reviewed from that day and he had an arterial Doppler on that day without significant stenosis or impaired blood flow. He discharged back to the nursing facility but since then the pain has continued and worsened and he developed dark discoloration of the right ring finger on his right hand and ulcer. Upon evaluation of the ED he is moderately hypertensive and lab work was done. Vascular surgery and hand surgery was consulted and vascular surgery evaluated and are planning a angiogram with a transfemoral approach with a focus in the right upper extremity to evaluate his inflow.    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 -Patient s/p arch aortogram with right upper extremity arteriogram and right radial artery angioplasty on 06/25/2022 per Dr. Chestine Spore.   -Per vascular surgery patient optimized and recommending continuation of aspirin and statin and starting Plavix when okay from hand surgery point of view.   -Discussed with PA, Earney Hamburg, covering for orthopedics/hand surgery who recommended resumption of Plavix today with outpatient follow-up with Dr. Yehuda Budd, hand surgeon in the next 1 to 2 weeks for further management.   -Start Plavix 75 mg daily today.  -Vascular surgery following. -Supportive care, pain management.  2.  ESRD on HD TTS -Patient seen in consultation by nephrology, underwent hemodialysis on 06/25/2022, will undergo hemodialysis tomorrow 06/27/2022 per nephrology and then back to regular schedule.   -Per nephrology.  3.  Chronic back and neck pain -Continue home regimen buprenorphine 2 mg SL at bedtime, oxycodone 5 mg p.o. every 4 hours as needed moderate pain and IV fentanyl 50 mcg every hour as needed moderate pain.  4.  Hypertension -Currently stable. -Demadex on hold.  5.  Hypothyroidism -Synthroid.  6.  Depression/anxiety -Lexapro, BuSpar.   7.  Anemia of chronic kidney disease -Hemoglobin currently at 7.0.   -Anemia panel with iron level of 19, TIBC of 164, ferritin of 793, folate of 11.8. -Ferrlecit iron load ordered per nephrology, aranesp for HD tomorrow per nephrology. -Transfuse 1 unit PRBCs today.  8.  GERD -PPI.  9.  Obesity -BMI 33.52 kg/m. -Lifestyle modification -Outpatient follow-up with PCP.  10.  Hyponatremia -On HD. -Stable.  11.  Hypomagnesemia -Magnesium of 1.4. -Patient for hemodialysis tomorrow. -Will defer replacement to nephrology. -Repeat magnesium in the AM.   DVT prophylaxis: Heparin Code Status: Full Family  Communication: Updated patient.  No family at bedside. Disposition: TBD  Status is: Inpatient Remains inpatient appropriate because: Severity of illness   Consultants:  Hand surgeon/orthopedics: Dr. Yehuda Budd 06/24/2022 Vascular surgery: Dr. Chestine Spore 06/23/2022 Nephrology: Dr. Signe Colt 06/24/2022  Procedures:  Transfuse 1 unit PRBCs 06/26/2022  Antimicrobials:  Anti-infectives (From admission, onward)    None         Subjective: Sitting up at the side of the bed.  Denies any chest pain.  No shortness of breath.  Feels some improvement with pain in the right hand although still hurts with palpation.  Tolerating current diet.  Denies any overt bleeding.    Objective: Vitals:   06/26/22 0330 06/26/22 0351 06/26/22 0355 06/26/22 0853  BP:  (!) 96/39 (!) 97/47 (!) 135/91  Pulse:  65 64 70  Resp:   20 12  Temp:   98.2 F (36.8 C) 98.5 F (36.9 C)  TempSrc:   Oral Oral  SpO2: 91% 91% 92%   Weight:      Height:        Intake/Output Summary (Last 24 hours) at 06/26/2022 1103 Last data filed at 06/25/2022 1923 Gross per 24 hour  Intake --  Output 2200 ml  Net -2200 ml    Filed Weights   06/24/22 0002 06/25/22 0850 06/25/22 1337  Weight: 94.2 kg 94.2 kg 92.2 kg    Examination:  General exam: NAD. Respiratory system: CTAB.  No wheezes, no crackles, no rhonchi.  Normal respiratory effort.  Speaking in full sentences.  Cardiovascular system: RRR no murmurs rubs or gallops.  No JVD.  No lower extremity edema.  Gastrointestinal system: Abdomen is soft, nontender, nondistended, positive bowel sounds.  No rebound.  No guarding.  Central nervous system: Alert and oriented. No focal neurological deficits. Extremities: Right ring finger with dry gangrene with some streaking erythema up the hand.  Ring finger and index finger tender to palpation.  Right index finger with some necrotic tissue on the tip..  Symmetric 5 x 5 power. Skin: No rashes, lesions or ulcers Psychiatry: Judgement and  insight appear normal. Mood & affect appropriate.     Data Reviewed: I have personally reviewed following labs and imaging studies  CBC: Recent Labs  Lab 06/23/22 1727 06/23/22 1755 06/24/22 0122 06/25/22 0259 06/26/22 0127  WBC  --  8.1 8.5 7.0 5.7  NEUTROABS  --   --   --  5.1 3.8  HGB 8.5* 8.0* 8.0* 7.3* 7.0*  HCT 25.0* 26.2* 25.7* 23.9* 23.1*  MCV  --  102.3* 101.2* 99.2 100.0  PLT  --  177 193 188 163     Basic Metabolic Panel: Recent Labs  Lab 06/23/22 1727 06/23/22 1755 06/24/22 0122 06/25/22 0259 06/26/22 0127  NA 134* 134* 135 133* 137  K 4.6 4.5 5.0 5.2* 4.2  CL 100 97* 97* 98 97*  CO2  --  26 25 22 27   GLUCOSE 127* 129* 92 88 84  BUN 52* 56* 59* 66* 32*  CREATININE 5.20* 4.99* 5.27* 6.44* 4.13*  CALCIUM  --  8.6* 8.9 8.3* 8.3*  MG  --   --   --  1.4* 1.4*  PHOS  --   --   --  5.6* 4.4     GFR: Estimated Creatinine Clearance: 17.7 mL/min (A) (by C-G formula based on SCr of 4.13 mg/dL (H)).  Liver Function Tests: Recent Labs  Lab 06/23/22 1755 06/25/22 0259 06/26/22 0127  AST 14* 19  --   ALT 12 11  --   ALKPHOS 138* 113  --   BILITOT 0.6 0.3  --   PROT 6.1* 5.8*  --   ALBUMIN 2.5* 2.3* 2.1*     CBG: No results for input(s): "GLUCAP" in the last 168 hours.   Recent Results (from the past 240 hour(s))  Surgical PCR screen     Status: None   Collection Time: 06/23/22  9:44 PM   Specimen: Nasal Mucosa; Nasal Swab  Result Value Ref Range Status   MRSA, PCR NEGATIVE NEGATIVE Final   Staphylococcus aureus NEGATIVE NEGATIVE Final    Comment: (NOTE) The Xpert SA Assay (FDA approved for NASAL specimens in patients 77 years of age and older), is one component of a comprehensive surveillance program. It is not intended to diagnose infection nor to guide or monitor treatment. Performed at [facility], [address], [place], [place] [address]          Radiology Studies: PERIPHERAL VASCULAR CATHETERIZATION  Result Date:  06/25/2022 Images from the original result were not included.   Patient name: Chase [name]     MRN: [id number]        DOB: 09-25-1950          Sex: male  06/25/2022 Pre-operative Diagnosis: Critical limb ischemia of the right upper extremity with tissue loss with gangrene of the fingertips Post-operative diagnosis:  Same Surgeon:  Cephus Shelling, MD Procedure Performed: 1.  Ultrasound-guided access right common femoral artery 2.  Limited arch aortogram 3.  Catheter selection of right axillary artery with dedicated right upper extremity arteriogram and catheter selection of right radial artery 4.  Right radial artery angioplasty (2.5 mm x 80 mm Sterling) for high grade 90% stenosis 5.  Mynx closure of the right common femoral artery 6.  35 minutes of monitored moderate conscious sedation time  Indications: 73 year old male with end-stage renal disease that vascular surgery was consulted for ischemic changes to the right fingertips on Monday night.  He presents for arch aortogram with right upper extremity arteriogram and possible intervention after risks benefits discussed.  Findings:  Ultrasound-guided access right common femoral artery.  Arch aortogram showed patent arch vessels including innominate and right subclavian artery.  We then cannulated the innominate with a headhunter catheter and got a wire out into the right axillary artery and advanced our catheter into the right axillary artery.  Injections of the right upper extremity showed widely patent brachial artery.  He has significant disease below the elbow.  The radial artery was heavily calcified and tortuous and had a high-grade 90% stenosis in the proximal vessel.  There was also a second approximate 50% stenosis at the wrist.  Interosseous was patent.  The ulnar artery was diffusely diseased calcified and small and occluded at the wrist with no filling of the palmar arch.  Significant small vessel disease in the hand with no digital vessels  identified.  From right transfemoral access, I was able to angioplasty the proximal right radial artery stenosis with a 2.5 mm x 80 mm Sterling with excellent results and widely patent proximal vessel.  We gave nitroglycerin throughout the case.  Tried to get down to the radial artery disease at the wrist into  the hand and we did not have any balloon shaft long enough to reach lesion.  Brisk right radial artery signal at completion with preserved runoff into hand.  Procedure:  The patient was identified in the holding area and taken to room 8.  The patient was then placed supine on the table and prepped and draped in the usual sterile fashion.  A time out was called.  Ultrasound was used to evaluate the right common femoral artery.  It was patent .  A digital ultrasound image was acquired.  A micropuncture needle was used to access the right common femoral artery under ultrasound guidance.  An 018 wire was advanced without resistance and a micropuncture sheath was placed.  The 018 wire was removed and a benson wire was placed.  Pigtail catheter was advanced into the ascending aorta and an arch aortogram was given.  Prior to this we did give 5000 units of IV heparin.  After the arch aortogram used a headhunter catheter with a Glidewire advantage to cannulate the innominate I got my wire out through the subclavian and advanced the catheter into the right axillary artery.  We then gave nitro and got dedicated right upper extremity angiogram images down to the hand.  Pertinent findings are noted above.  He has significant disease distal to the brachial artery in the forearm and hand.  Ultimately I did not think intervention on the ulnar artery was an option as this essentially occluded at the wrist with no filling of the palmar arch. I was able to get into the radial artery with a V18 and quick cross catheter in the proximal vessel and got dedicated hand injection imaging.  I then performed angioplasty of the proximal  vessel with a 2.5 mm x 80 mm Sterling to nominal pressure for 2 minutes for the high-grade stenosis.  I was able to get my wire distally across the wrist for the second lesion but the balloon would not reach the lesion and this was the longest shaft length we had.  Essentially wires and catheters were removed and we put a short 5 French sheath right groin with a mynx closure device  Findings: Good results after angioplasty of high-grade proximal right radial stenosis at this is dominant runoff into the hand.  He still has significant small vessel disease in the hand and ulnar artery occludes at the wrist and is diminutive and diseased.  Aspirin statin for now.  Will start Plavix when okay from hand surgery.  Cephus Shelling, MD [facility] Office: [phone]        Scheduled Meds:  sodium chloride   Intravenous Once   acetaminophen  650 mg Oral Once   aspirin EC  81 mg Oral Daily   buprenorphine  2 mg Sublingual QHS   busPIRone  10 mg Oral BID   calcitRIOL  0.25 mcg Oral Daily   calcium acetate  667 mg Oral TID WC   Chlorhexidine Gluconate Cloth  6 each Topical Q0600   clopidogrel  75 mg Oral Daily   darbepoetin (ARANESP) injection - DIALYSIS  100 mcg Subcutaneous Q Thu-1800   diphenhydrAMINE  25 mg Oral Once   escitalopram  20 mg Oral Daily   heparin  5,000 Units Subcutaneous Q8H   levothyroxine  250 mcg Oral QAC breakfast   melatonin  5 mg Oral QHS   pantoprazole  40 mg Oral Daily   rosuvastatin  10 mg Oral QHS   sodium chloride flush  3 mL Intravenous Q12H   Continuous Infusions:  sodium chloride     ferric gluconate (FERRLECIT) IVPB     magnesium sulfate bolus IVPB       LOS: 3 days    Time spent: 40 minutes    Ramiro Harvest, MD [facility]   To contact the attending provider between 7A-7P or the covering provider during after hours 7P-7A, please log into the web site [email or web address] and access using universal [place]  password for that web site. If you do not have the password, please call the hospital operator.  06/26/2022, 11:03 AM

## 2022-06-26 NOTE — Progress Notes (Addendum)
  Progress Note    06/26/2022 6:39 AM 1 Day Post-Op  Subjective:  says he still has a little pain in the right hand.  When asked, he says his hand does feel a little better.  Wants to sit up   afebrile  Vitals:   06/26/22 0351 06/26/22 0355  BP: (!) 96/39 (!) 97/47  Pulse: 65 64  Resp:  20  Temp:  98.2 F (36.8 C)  SpO2: 91% 92%    Physical Exam: General:  no distress Cardiac:  regular Lungs:  non labored Incisions:  right groin is soft without hematoma Extremities:  monophasic right radial and ulnar doppler signal.  Right ring finger with gangrenous changes.   CBC    Component Value Date/Time   WBC 5.7 06/26/2022 0127   RBC 2.31 (L) 06/26/2022 0127   HGB 7.0 (L) 06/26/2022 0127   HCT 23.1 (L) 06/26/2022 0127   PLT 163 06/26/2022 0127   MCV 100.0 06/26/2022 0127   MCH 30.3 06/26/2022 0127   MCHC 30.3 06/26/2022 0127   RDW 16.3 (H) 06/26/2022 0127   LYMPHSABS 1.0 06/26/2022 0127   MONOABS 0.5 06/26/2022 0127   EOSABS 0.3 06/26/2022 0127   BASOSABS 0.0 06/26/2022 0127    BMET    Component Value Date/Time   NA 137 06/26/2022 0127   K 4.2 06/26/2022 0127   CL 97 (L) 06/26/2022 0127   CO2 27 06/26/2022 0127   GLUCOSE 84 06/26/2022 0127   BUN 32 (H) 06/26/2022 0127   CREATININE 4.13 (H) 06/26/2022 0127   CALCIUM 8.3 (L) 06/26/2022 0127   GFRNONAA 15 (L) 06/26/2022 0127    INR    Component Value Date/Time   INR 1.3 (H) 06/23/2022 1755     Intake/Output Summary (Last 24 hours) at 06/26/2022 1308 Last data filed at 06/25/2022 1923 Gross per 24 hour  Intake 0 ml  Output 2200 ml  Net -2200 ml      Assessment/Plan:  72 y.o. male is s/p:  Arch aortogram via right CFA with right radial artery angioplasty on 06/25/2022 by Dr. Chestine Spore  1 Day Post-Op   -pt with monophasic doppler flow right radial and ulnar arteries.  Will need consult to hand surgery.  Finger tips to demarcate.  -asa/statin for now and start Plavix when ok with hand surgery. -ok for pt  to sit up and OOB from vascular standpoint.   Doreatha Massed, PA-C [facility] [phone] 06/26/2022 6:39 AM  I have seen and evaluated the patient. I agree with the PA note as documented above.  Post procedure day 1 status post right transfemoral access with right upper extremity angio and a right radial artery angioplasty.  Optimized from vascular surgery.  Has good radial signal at the wrist.  Aspirin statin from our standpoint and Plavix when okay from hand surgery.  Discussed with him and his wife that he remains very high risk given severe disease below the elbow including small vessel disease in the hand.  Cephus Shelling, MD [facility] [place] Office: [phone]

## 2022-06-26 NOTE — Evaluation (Signed)
Occupational Therapy Evaluation Patient Details Name: Chase [name] MRN: [id number] DOB: 11-Aug-1950 Today's Date: 06/26/2022   History of Present Illness 72 y.o. male presenting to [facility] with dry gangrene of R hand. PMH includes back pain, depression, CAD, DM, CKD, HTN, SDH, ESRD   Clinical Impression   Pt admitted for above dx, PTA patient reports ambulating independently no AD and being ind in ADLs, unsure if pt is a poor historian or not. Pt needing Mod A for STS barely taking steps due to balance feeling off, and presenting with RUE pain in hand and forearm with AROM/PROM (see below) as well as generalized weakness. Educated pt to complete AROM exercises to tolerance (see below) to promote joint integrity, pt with decreased functional gross grasp capabilities of RUE which impacts bADLs/iADLs. Pt would benefit from continued acute skilled OT services to address above deficits and help transition to next level of care. Patient would benefit from post acute Home OT services to help maximize functional independence in natural environment       Recommendations for follow up therapy are one component of a multi-disciplinary discharge planning process, led by the attending physician.  Recommendations may be updated based on patient status, additional functional criteria and insurance authorization.   Assistance Recommended at Discharge Intermittent Supervision/Assistance  Patient can return home with the following A lot of help with walking and/or transfers;A lot of help with bathing/dressing/bathroom;Assistance with cooking/housework;Assist for transportation;Help with stairs or ramp for entrance    Functional Status Assessment  Patient has had a recent decline in their functional status and demonstrates the ability to make significant improvements in function in a reasonable and predictable amount of time.  Equipment Recommendations  None recommended by OT (Pt has rec DME. Will reassess need for  any additional pendigng pt progression)    Recommendations for Other Services       Precautions / Restrictions Precautions Precautions: Fall Precaution Comments: fall hx Restrictions Weight Bearing Restrictions: No      Mobility Bed Mobility               General bed mobility comments: not assessed, Pt received and left sitting in recliner    Transfers Overall transfer level: Needs assistance Equipment used: Rolling walker (2 wheels) Transfers: Sit to/from Stand Sit to Stand: Mod assist           General transfer comment: STS x2 from recliner Mod A      Balance Overall balance assessment: Needs assistance Sitting-balance support: Feet supported Sitting balance-Leahy Scale: Fair Sitting balance - Comments: Pt in recliner, sitting balance not fully assessed   Standing balance support: Bilateral upper extremity supported, During functional activity, Reliant on assistive device for balance Standing balance-Leahy Scale: Poor                     ADL either performed or assessed with clinical judgement   ADL Overall ADL's : Needs assistance/impaired Eating/Feeding: Sitting;Set up;Supervision/ safety   Grooming: Sitting;Set up;Supervision/safety   Upper Body Bathing: Minimal assistance;Sitting   Lower Body Bathing: Sitting/lateral leans;Moderate assistance   Upper Body Dressing : Sitting;Min guard;Set up   Lower Body Dressing: Sitting/lateral leans;Moderate assistance               Functional mobility during ADLs: Min guard;Rolling walker (2 wheels) General ADL Comments: Pt ambulated 56ft with RW, wanting to sit down due to feeling unsteady      Pertinent Vitals/Pain Pain Assessment Pain Assessment: 0-10 Pain Score: 6  Pain  Location: R hand to forearm Pain Descriptors / Indicators: Aching, Shooting, Burning Pain Intervention(s): Limited activity within patient's tolerance, Monitored during session, Repositioned     Hand Dominance  Right   Extremity/Trunk Assessment Upper Extremity Assessment Upper Extremity Assessment: Generalized weakness RUE Deficits / Details: Pt reports pain with AROM, shoulder flex ~90 degrees 3/5 MMT, can touch face. R middle finger blackened/necrotic. R fingers limited to to ~40 degrees PIP flexion, able to make parital hook fist. LUE Deficits / Details: Able to make full fist, shoulder flex about 90-95 degrees at baseline per pt report, can touch face, 3-/5 MMT shldr flex   Lower Extremity Assessment Lower Extremity Assessment: Defer to PT evaluation   Cervical / Trunk Assessment Cervical / Trunk Assessment: Kyphotic   Communication Communication Communication: Expressive difficulties (mild expressive difficulties, stumbled speech)   Cognition Arousal/Alertness: Awake/alert Behavior During Therapy: Flat affect Overall Cognitive Status: No family/caregiver present to determine baseline cognitive functioning                                 General Comments: Pt following simple commands with increased time     General Comments  Pt Sp02 decreased to 93% upon STS, his Sp02 95-99% at rest. Pt reports pain with AROM and PROM, not tolerating MEM.    Exercises General Exercises - Upper Extremity Elbow Flexion: AROM, Right, 5 reps, Seated Elbow Extension: AROM, Right, 5 reps, Seated Wrist Flexion: AROM, PROM, Right, 10 reps, Seated (both AROM and PROM) Wrist Extension: AROM, PROM, 10 reps, Right (both AROM and PROM) Digit Composite Flexion: AROM, Right, 5 reps, Limitations Composite Flexion Limitations: Attempted squeeze ball, pt not able to tolerate. Can do hook fist Other Exercises Other Exercises: tendon glides RUE to tolerance Other Exercises: Desensitization techniques, rubbing soft objects Other Exercises: Finger adduction/abduction x5 reps RUE   Shoulder Instructions      Home Living Family/patient expects to be discharged to:: Private residence Living  Arrangements: Spouse/significant other Available Help at Discharge: Family;Available PRN/intermittently (wife works from 11-4pm four days per week) Type of Home: Apartment Home Access: Level entry     Home Layout: One level     Bathroom Shower/Tub: Chief Strategy Officer: Standard Bathroom Accessibility: Yes How Accessible: Accessible via walker Home Equipment: Grab bars - tub/shower;Grab bars - toilet;Rolling Walker (2 wheels);Rollator (4 wheels);Cane - single point;Tub bench;Hand held shower head          Prior Functioning/Environment Prior Level of Function : Independent/Modified Independent;History of Falls (last six months)             Mobility Comments: Ambulated no AD ADLs Comments: ind        OT Problem List: Impaired UE functional use;Impaired balance (sitting and/or standing);Decreased activity tolerance;Decreased strength;Pain      OT Treatment/Interventions: Self-care/ADL training;Splinting;Therapeutic exercise;Therapeutic activities;Patient/family education;Balance training;DME and/or AE instruction    OT Goals(Current goals can be found in the care plan section) Acute Rehab OT Goals Patient Stated Goal: To get home OT Goal Formulation: With patient Time For Goal Achievement: 07/10/22 Potential to Achieve Goals: Good ADL Goals Pt Will Perform Grooming: standing;with min guard assist Pt Will Perform Lower Body Dressing: sitting/lateral leans;with supervision;with set-up Pt Will Transfer to Toilet: with min guard assist;ambulating Pt/caregiver will Perform Home Exercise Program: Increased ROM;Right Upper extremity;With written HEP provided;Independently;Increased strength Additional ADL Goal #2: Pt will achieve 70 degrees PIP flexion in R hand in preparation for functional  grasp activites  OT Frequency: Min 3X/week    Co-evaluation              AM-PAC OT "6 Clicks" Daily Activity     Outcome Measure Help from another person eating  meals?: A Little Help from another person taking care of personal grooming?: A Little Help from another person toileting, which includes using toliet, bedpan, or urinal?: A Little Help from another person bathing (including washing, rinsing, drying)?: A Little Help from another person to put on and taking off regular upper body clothing?: A Little Help from another person to put on and taking off regular lower body clothing?: A Lot 6 Click Score: 17   End of Session Equipment Utilized During Treatment: Rolling walker (2 wheels);Gait belt Nurse Communication: Mobility status  Activity Tolerance: Patient tolerated treatment well Patient left: in chair;with call bell/phone within reach;with chair alarm set  OT Visit Diagnosis: Unsteadiness on feet (R26.81);Other abnormalities of gait and mobility (R26.89);History of falling (Z91.81);Muscle weakness (generalized) (M62.81);Pain Pain - Right/Left: Right Pain - part of body: Hand                Time: 4742-5956 OT Time Calculation (min): 35 min Charges:  OT General Charges $OT Visit: 1 Visit OT Evaluation $OT Eval Moderate Complexity: 1 Mod OT Treatments $Therapeutic Exercise: 8-22 mins  06/26/2022  AB, OTR/L  Acute Rehabilitation Services  Office: [phone]   Tristan Schroeder 06/26/2022, 6:21 PM

## 2022-06-26 NOTE — Progress Notes (Signed)
PHARMACIST LIPID MONITORING   Chase [name] is a 72 y.o. male admitted on 06/23/2022 with PVD.  Pharmacy has been consulted to optimize lipid-lowering therapy with the indication of secondary prevention for clinical ASCVD.  Recent Labs:  Lipid Panel (last 6 months):   No results found for: "CHOL", "TRIG", "HDL", "CHOLHDL", "VLDL", "LDLCALC", "LDLDIRECT"  Hepatic function panel (last 6 months):   Lab Results  Component Value Date   AST 19 06/25/2022   ALT 11 06/25/2022   ALKPHOS 113 06/25/2022   BILITOT 0.3 06/25/2022    SCr (since admission):   Serum creatinine: 4.13 mg/dL (H) 16/10/96 0454 Estimated creatinine clearance: 17.7 mL/min (A)  Current therapy and lipid therapy tolerance Current lipid-lowering therapy: Crestor 10mg  Previous lipid-lowering therapies (if applicable): n/a Documented or reported allergies or intolerances to lipid-lowering therapies (if applicable): N/a  Assessment:   Patient is excluded from the protocol due to ESRD (ESRD, elevated LFTs, pregnancy/breastfeeding, active liver disease)  Plan:    1.Statin intensity (high intensity recommended for all patients regardless of the LDL):  No statin changes. The patient is already on a high intensity statin.  2.Add ezetimibe (if any one of the following):   Not indicated at this time.  3.Refer to lipid clinic:   No  4.Follow-up with:  Primary care provider - Randel Pigg, Dorma Russell, MD  5.Follow-up labs after discharge:  No changes in lipid therapy, repeat a lipid panel in one year.      Ulyses Southward, PharmD, BCIDP, AAHIVP, CPP Infectious Disease Pharmacist 06/26/2022 8:06 AM

## 2022-06-27 DIAGNOSIS — I96 Gangrene, not elsewhere classified: Secondary | ICD-10-CM | POA: Diagnosis not present

## 2022-06-27 DIAGNOSIS — E039 Hypothyroidism, unspecified: Secondary | ICD-10-CM | POA: Diagnosis not present

## 2022-06-27 DIAGNOSIS — N186 End stage renal disease: Secondary | ICD-10-CM | POA: Diagnosis not present

## 2022-06-27 DIAGNOSIS — I10 Essential (primary) hypertension: Secondary | ICD-10-CM | POA: Diagnosis not present

## 2022-06-27 DIAGNOSIS — Z992 Dependence on renal dialysis: Secondary | ICD-10-CM | POA: Diagnosis not present

## 2022-06-27 LAB — CBC WITH DIFFERENTIAL/PLATELET
Abs Immature Granulocytes: 0.01 10*3/uL (ref 0.00–0.07)
Basophils Absolute: 0 10*3/uL (ref 0.0–0.1)
Basophils Relative: 0 %
Eosinophils Absolute: 0.3 10*3/uL (ref 0.0–0.5)
Eosinophils Relative: 5 %
HCT: 25.7 % — ABNORMAL LOW (ref 39.0–52.0)
Hemoglobin: 7.8 g/dL — ABNORMAL LOW (ref 13.0–17.0)
Immature Granulocytes: 0 %
Lymphocytes Relative: 15 %
Lymphs Abs: 1 10*3/uL (ref 0.7–4.0)
MCH: 29.7 pg (ref 26.0–34.0)
MCHC: 30.4 g/dL (ref 30.0–36.0)
MCV: 97.7 fL (ref 80.0–100.0)
Monocytes Absolute: 0.5 10*3/uL (ref 0.1–1.0)
Monocytes Relative: 7 %
Neutro Abs: 5.2 10*3/uL (ref 1.7–7.7)
Neutrophils Relative %: 73 %
Platelets: 180 10*3/uL (ref 150–400)
RBC: 2.63 MIL/uL — ABNORMAL LOW (ref 4.22–5.81)
RDW: 15.6 % — ABNORMAL HIGH (ref 11.5–15.5)
WBC: 7.1 10*3/uL (ref 4.0–10.5)
nRBC: 0 % (ref 0.0–0.2)

## 2022-06-27 LAB — BPAM RBC: Blood Product Expiration Date: 202407192359

## 2022-06-27 LAB — URINALYSIS, ROUTINE W REFLEX MICROSCOPIC
Bilirubin Urine: NEGATIVE
Glucose, UA: 150 mg/dL — AB
Ketones, ur: NEGATIVE mg/dL
Nitrite: NEGATIVE
Protein, ur: 30 mg/dL — AB
RBC / HPF: >50 RBC/hpf (ref 0–5)
Specific Gravity, Urine: 1.006 (ref 1.005–1.030)
pH: 9 — ABNORMAL HIGH (ref 5.0–8.0)

## 2022-06-27 LAB — TYPE AND SCREEN
ABO/RH(D): O POS
Unit division: 0

## 2022-06-27 LAB — RENAL FUNCTION PANEL
Albumin: 2.3 g/dL — ABNORMAL LOW (ref 3.5–5.0)
Anion gap: 14 (ref 5–15)
BUN: 46 mg/dL — ABNORMAL HIGH (ref 8–23)
CO2: 24 mmol/L (ref 22–32)
Calcium: 8.5 mg/dL — ABNORMAL LOW (ref 8.9–10.3)
Chloride: 96 mmol/L — ABNORMAL LOW (ref 98–111)
Creatinine, Ser: 5.63 mg/dL — ABNORMAL HIGH (ref 0.61–1.24)
GFR, Estimated: 10 mL/min — ABNORMAL LOW (ref >60)
Glucose, Bld: 106 mg/dL — ABNORMAL HIGH (ref 70–99)
Phosphorus: 6 mg/dL — ABNORMAL HIGH (ref 2.5–4.6)
Potassium: 4.3 mmol/L (ref 3.5–5.1)
Sodium: 134 mmol/L — ABNORMAL LOW (ref 135–145)

## 2022-06-27 LAB — MAGNESIUM: Magnesium: 1.6 mg/dL — ABNORMAL LOW (ref 1.7–2.4)

## 2022-06-27 NOTE — Evaluation (Signed)
Physical Therapy Evaluation Patient Details Name: Chase [name] MRN: [id number] DOB: 1950-12-27 Today's Date: 06/27/2022  History of Present Illness  72 y.o. male presenting to [facility] 6/17 with dry gangrene of R hand s/p 6/19 arch aortogram with right upper extremity arteriogram. PMH includes back pain, depression, CAD, DM, CKD, HTN, SDH, ESRD  Clinical Impression  Pt currently living at [facility] and per wife able to walk 90 feet with RW, working with OT on bathing and dressing. Pt is currently limited in safe mobility by decreased strength, balance and endurance. Pt requires min A for bed mobility with HoB elevated and use of bed rail, modA to stand and ambulate to recliner. PT recommending return to [facility] at discharge.        Recommendations for follow up therapy are one component of a multi-disciplinary discharge planning process, led by the attending physician.  Recommendations may be updated based on patient status, additional functional criteria and insurance authorization.  Follow Up Recommendations Can patient physically be transported by private vehicle: No     Assistance Recommended at Discharge Frequent or constant Supervision/Assistance  Patient can return home with the following  A lot of help with walking and/or transfers;A lot of help with bathing/dressing/bathroom;Assistance with cooking/housework;Assistance with feeding;Direct supervision/assist for medications management;Direct supervision/assist for financial management;Assist for transportation;Help with stairs or ramp for entrance    Equipment Recommendations None recommended by PT     Functional Status Assessment Patient has had a recent decline in their functional status and demonstrates the ability to make significant improvements in function in a reasonable and predictable amount of time.     Precautions / Restrictions Precautions Precautions: Fall Precaution Comments: fall hx Restrictions Weight Bearing  Restrictions: No      Mobility  Bed Mobility Overal bed mobility: Needs Assistance Bed Mobility: Supine to Sit     Supine to sit: HOB elevated, Min assist     General bed mobility comments: min A for pt to pull against PT and for pad scoot of hips around to EoB    Transfers Overall transfer level: Needs assistance Equipment used: Rolling walker (2 wheels) Transfers: Sit to/from Stand Sit to Stand: Mod assist, From elevated surface           General transfer comment: requires modA for standing from elevated bed, cuing for hand placement for power up    Ambulation/Gait Ambulation/Gait assistance: Min assist Gait Distance (Feet): 3 Feet Assistive device: Rolling walker (2 wheels) Gait Pattern/deviations: Step-to pattern, Decreased step length - right, Decreased step length - left, Shuffle, Trunk flexed Gait velocity: slowed Gait velocity interpretation: <1.31 ft/sec, indicative of household ambulator   General Gait Details: slowed stepping with decreased foot clearance, unsteady but no LoB        Balance Overall balance assessment: Needs assistance Sitting-balance support: Feet supported Sitting balance-Leahy Scale: Fair     Standing balance support: Bilateral upper extremity supported, During functional activity, Reliant on assistive device for balance Standing balance-Leahy Scale: Poor                               Pertinent Vitals/Pain Pain Assessment Pain Assessment: 0-10 Pain Score: 5  Pain Location: R hand and forearm Pain Descriptors / Indicators: Discomfort, Grimacing, Guarding Pain Intervention(s): Limited activity within patient's tolerance, Monitored during session, Repositioned    Home Living Family/patient expects to be discharged to:: Skilled nursing facility  Additional Comments: pt was rehabbing at [facility] after prior hospitalization and plan is to return at discharge    Prior Function Prior Level  of Function : Needs assist             Mobility Comments: ambulating 90 ft with RW during PT ADLs Comments: requires assist for ADLs, working with OT     Hand Dominance   Dominant Hand: Right    Extremity/Trunk Assessment   Upper Extremity Assessment Upper Extremity Assessment: Defer to OT evaluation    Lower Extremity Assessment Lower Extremity Assessment: Generalized weakness    Cervical / Trunk Assessment Cervical / Trunk Assessment: Kyphotic  Communication   Communication: Expressive difficulties (halting speech)  Cognition Arousal/Alertness: Awake/alert Behavior During Therapy: Flat affect Overall Cognitive Status: Impaired/Different from baseline Area of Impairment: Problem solving                             Problem Solving: Requires verbal cues, Decreased initiation, Difficulty sequencing, Slow processing, Requires tactile cues General Comments: pt with slowed response to questions requiring cuing for sequencing        General Comments General comments (skin integrity, edema, etc.): VSS on RA        Assessment/Plan    PT Assessment Patient needs continued PT services  PT Problem List Decreased strength;Decreased range of motion;Decreased activity tolerance;Decreased balance;Decreased mobility;Decreased coordination;Decreased cognition;Cardiopulmonary status limiting activity;Impaired sensation;Decreased skin integrity;Pain       PT Treatment Interventions DME instruction;Gait training;Functional mobility training;Therapeutic activities;Therapeutic exercise;Balance training;Cognitive remediation;Patient/family education    PT Goals (Current goals can be found in the Care Plan section)  Acute Rehab PT Goals Patient Stated Goal: get better PT Goal Formulation: With patient/family Time For Goal Achievement: 07/11/22 Potential to Achieve Goals: Fair    Frequency Min 1X/week        AM-PAC PT "6 Clicks" Mobility  Outcome Measure Help  needed turning from your back to your side while in a flat bed without using bedrails?: A Little Help needed moving from lying on your back to sitting on the side of a flat bed without using bedrails?: A Lot Help needed moving to and from a bed to a chair (including a wheelchair)?: A Lot Help needed standing up from a chair using your arms (e.g., wheelchair or bedside chair)?: A Lot Help needed to walk in hospital room?: Total Help needed climbing 3-5 steps with a railing? : Total 6 Click Score: 11    End of Session Equipment Utilized During Treatment: Gait belt Activity Tolerance: Patient limited by fatigue Patient left: in chair;with call bell/phone within reach;with family/visitor present Nurse Communication: Mobility status PT Visit Diagnosis: Unsteadiness on feet (R26.81);Other abnormalities of gait and mobility (R26.89);Muscle weakness (generalized) (M62.81);History of falling (Z91.81);Difficulty in walking, not elsewhere classified (R26.2);Other symptoms and signs involving the nervous system (Z61.096)    Time: 0454-0981 PT Time Calculation (min) (ACUTE ONLY): 18 min   Charges:   PT Evaluation $PT Eval Moderate Complexity: 1 Mod          Chase [name] B. Beverely Risen PT, DPT Acute Rehabilitation Services Please use secure chat or  Call Office [phone]   Elon Alas [facility] 06/27/2022, 3:04 PM

## 2022-06-27 NOTE — Progress Notes (Addendum)
  Progress Note    06/27/2022 6:42 AM 2 Days Post-Op  Subjective:  sitting up in chair comfortable; says he feels disoriented this morning.  Says both hands hurt  afebrile  Vitals:   06/26/22 2310 06/27/22 0330  BP: 103/66 (!) 101/55  Pulse:  69  Resp: 18 16  Temp: 98.2 F (36.8 C) 98.6 F (37 C)  SpO2: 98% 90%    Physical Exam: General:  no distress sitting in chair Cardiac:  regular Lungs:  non labored Extremities:  + doppler flow bilateral radial and ulnar arteries.  Gangrene right fingertips unchanged   CBC    Component Value Date/Time   WBC 5.7 06/26/2022 0127   RBC 2.31 (L) 06/26/2022 0127   HGB 8.8 (L) 06/26/2022 1913   HCT 28.4 (L) 06/26/2022 1913   PLT 163 06/26/2022 0127   MCV 100.0 06/26/2022 0127   MCH 30.3 06/26/2022 0127   MCHC 30.3 06/26/2022 0127   RDW 16.3 (H) 06/26/2022 0127   LYMPHSABS 1.0 06/26/2022 0127   MONOABS 0.5 06/26/2022 0127   EOSABS 0.3 06/26/2022 0127   BASOSABS 0.0 06/26/2022 0127    BMET    Component Value Date/Time   NA 137 06/26/2022 0127   K 4.2 06/26/2022 0127   CL 97 (L) 06/26/2022 0127   CO2 27 06/26/2022 0127   GLUCOSE 84 06/26/2022 0127   BUN 32 (H) 06/26/2022 0127   CREATININE 4.13 (H) 06/26/2022 0127   CALCIUM 8.3 (L) 06/26/2022 0127   GFRNONAA 15 (L) 06/26/2022 0127    INR    Component Value Date/Time   INR 1.3 (H) 06/23/2022 1755     Intake/Output Summary (Last 24 hours) at 06/27/2022 8295 Last data filed at 06/27/2022 0300 Gross per 24 hour  Intake 619.85 ml  Output --  Net 619.85 ml      Assessment/Plan:  72 y.o. male is s/p:  Arch aortogram via right CFA with right radial artery angioplasty on 06/25/2022 by Dr. Chestine Spore   2 Days Post-Op   -pt continues to have + doppler flow bilateral radial and ulnar arteries; ortho restarted Plavix yesterday and recommended f/u with Dr. Yehuda Budd outpatient in the next 1-2 weeks.  -nephrology requesting PD catheter be removed.  Will d/w Dr. Chestine Spore. -continue  asa/statin/plavix   Doreatha Massed, PA-C [facility] [phone] 06/27/2022 6:42 AM  I have seen and evaluated the patient. I agree with the PA note as documented above.  Status post right upper extremity angiogram with radial artery angioplasty for critical limb ischemia with tissue loss.  Hand surgery following.  Vascular was called yesterday to remove PD catheter given he is no longer using this.  Scheduled for Monday for PD catheter removal.  Discussed with patient.  Cephus Shelling, MD [facility] [place] Office: [phone]

## 2022-06-27 NOTE — Progress Notes (Signed)
OT Cancellation Note  Patient Details Name: Robben Jagiello MRN: [id number] DOB: [birth year]   Cancelled Treatment:    Reason Eval/Treat Not Completed: Patient at procedure or test/ unavailable. Will follow and see as able.   Barry Brunner, OT Acute Rehabilitation Services Office [phone]   Chancy Milroy 06/27/2022, 9:53 AM

## 2022-06-27 NOTE — Progress Notes (Signed)
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  He was admitted to [facility] from 5 /1 until 05/13/2022 due to uremia from that time he was transitioned from PD to HD. He is just from the hospital stay to nursing home regimen overall doing well per patient and his wife.  -About 3 weeks ago he started having pain in his right hand  and progressively gotten worse and he was sent to the ED on 06/04/2022 for complaints of right fingertip pain and at that time he had good capillary refill images reviewed from that day and he had an arterial Doppler on that day without significant stenosis or impaired blood flow. He discharged back to the nursing facility but since then the pain has continued and worsened and he developed dark discoloration of the right ring finger on his right hand and ulcer. Upon evaluation of the ED he is moderately hypertensive and lab work was done. Vascular surgery and hand surgery was consulted and vascular surgery evaluated and are planning a angiogram with a transfemoral approach with a focus in the right upper extremity to evaluate his inflow.    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 -Patient s/p arch aortogram with right upper extremity arteriogram and right radial artery angioplasty on 06/25/2022 per Dr. Chestine Spore.   -Per vascular surgery patient optimized and recommending continuation of aspirin and statin and starting Plavix when okay from hand surgery point of view.   -Discussed with PA, Earney Hamburg, covering for orthopedics/hand surgery who recommended resumption of Plavix with outpatient follow-up with Dr. Yehuda Budd, hand surgeon in the next 1 to 2 weeks for further management.   -Continue Plavix 75 mg daily.   -Vascular surgery following. -Supportive care, pain management.  2.  ESRD on HD TTS -Patient seen in consultation by nephrology, underwent hemodialysis on 06/25/2022, will undergo hemodialysis, today 06/27/2022 per nephrology and then back to regular schedule tomorrow.   -Per nephrology PD catheter to be removed on Monday by vascular surgery. -Per nephrology.  3.  Chronic back and neck pain -Continue home regimen buprenorphine 2 mg SL at bedtime, oxycodone 5 mg p.o. every 4 hours as needed moderate pain and IV fentanyl 50 mcg every hour as needed moderate pain.  4.  Hypertension -Currently stable. -Demadex on hold.  5.  Hypothyroidism -Synthroid.  6.  Depression/anxiety -Continue BuSpar, Lexapro.   7.  Anemia of chronic kidney disease -Hemoglobin currently at 7.8 from 7.0 from 8.5 on admission.  -Status post transfusion 1 unit PRBCs 06/26/2022 -Anemia panel with iron level of 19, TIBC of 164, ferritin  of 793, folate of 11.8. -Ferrlecit iron load ordered per nephrology, aranesp in HD today per nephrology.  8.  GERD -Continue PPI.   9.  Obesity -BMI 33.52 kg/m. -Lifestyle modification -Outpatient follow-up with PCP.  10.  Hyponatremia -On HD. -Stable.  11.  Hypomagnesemia -Magnesium of 1.6. -Patient currently in  hemodialysis. -Will defer repletion to nephrology.   DVT prophylaxis: Heparin Code Status: Full Family Communication: Updated patient.  No family at bedside. Disposition: TBD  Status is: Inpatient Remains inpatient appropriate because: Severity of illness   Consultants:  Hand surgeon/orthopedics: Dr. Yehuda Budd 06/24/2022 Vascular surgery: Dr. Chestine Spore 06/23/2022 Nephrology: Dr. Signe Colt 06/24/2022  Procedures:  Transfuse 1 unit PRBCs 06/26/2022  Antimicrobials:  Anti-infectives (From admission, onward)    None         Subjective: Patient in hemodialysis.  Denies any chest pain or shortness of breath.  No abdominal pain.  Still with complaints of right hand pain.  Alert and oriented to self place and time.  Slightly slow to respond to questions.  Objective: Vitals:   06/27/22 0930 06/27/22 1000 06/27/22 1030 06/27/22 1100  BP: (!) 90/54 (!) 89/78 (!) 127/48 (!) 117/54  Pulse: 60 61 62 69  Resp: 17 19 11 16   Temp:      TempSrc:      SpO2: 96% 99% 96% 95%  Weight:      Height:        Intake/Output Summary (Last 24 hours) at 06/27/2022 1121 Last data filed at 06/27/2022 0300 Gross per 24 hour  Intake 619.85 ml  Output --  Net 619.85 ml    Filed Weights   06/25/22 0850 06/25/22 1337 06/27/22 0815  Weight: 94.2 kg 92.2 kg 92.1 kg    Examination:  General exam: NAD. Respiratory system: CTAB anterior lung fields.  No wheezes, no crackles, no rhonchi.  Normal respiratory effort.  Speaking in full sentences.  Cardiovascular system: Regular rate rhythm no murmurs rubs or gallops.  No JVD.  No significant lower extremity edema. Gastrointestinal system: Abdomen is soft, nontender, nondistended, positive bowel sounds.  No rebound.  No guarding.  Central nervous system: Alert and oriented. No focal neurological deficits. Extremities: Right ring finger with dry gangrene with some streaking erythema up the hand.  Ring finger and index finger less tender to palpation.  Right index  finger with some necrotic tissue on the tip..  Symmetric 5 x 5 power. Skin: No rashes, lesions or ulcers Psychiatry: Judgement and insight appear normal. Mood & affect appropriate.     Data Reviewed: I have personally reviewed following labs and imaging studies  CBC: Recent Labs  Lab 06/23/22 1755 06/24/22 0122 06/25/22 0259 06/26/22 0127 06/26/22 1913 06/27/22 0805  WBC 8.1 8.5 7.0 5.7  --  7.1  NEUTROABS  --   --  5.1 3.8  --  5.2  HGB 8.0* 8.0* 7.3* 7.0* 8.8* 7.8*  HCT 26.2* 25.7* 23.9* 23.1* 28.4* 25.7*  MCV 102.3* 101.2* 99.2 100.0  --  97.7  PLT 177 193 188 163  --  180     Basic Metabolic Panel: Recent Labs  Lab 06/23/22 1755 06/24/22 0122 06/25/22 0259 06/26/22 0127 06/27/22 0805  NA 134* 135 133* 137 134*  K 4.5 5.0 5.2* 4.2 4.3  CL 97* 97* 98 97* 96*  CO2 26 25 22 27 24   GLUCOSE 129* 92 88 84 106*  BUN 56* 59* 66* 32* 46*  CREATININE 4.99* 5.27* 6.44* 4.13* 5.63*  CALCIUM 8.6* 8.9 8.3* 8.3* 8.5*  MG  --   --  1.4* 1.4* 1.6*  PHOS  --   --  5.6* 4.4 6.0*     GFR: Estimated Creatinine Clearance: 13 mL/min (A) (by C-G formula based on SCr of 5.63 mg/dL (H)).  Liver Function Tests: Recent Labs  Lab 06/23/22 1755 06/25/22 0259 06/26/22 0127 06/27/22 0805  AST 14* 19  --   --   ALT 12 11  --   --   ALKPHOS 138* 113  --   --   BILITOT 0.6 0.3  --   --   PROT 6.1* 5.8*  --   --   ALBUMIN 2.5* 2.3* 2.1* 2.3*     CBG: No results for input(s): "GLUCAP" in the last 168 hours.   Recent Results (from the past 240 hour(s))  Surgical PCR screen     Status: None   Collection Time: 06/23/22  9:44 PM   Specimen: Nasal Mucosa; Nasal Swab  Result Value Ref Range Status   MRSA, PCR NEGATIVE NEGATIVE Final   Staphylococcus aureus NEGATIVE NEGATIVE Final    Comment: (NOTE) The Xpert SA Assay (FDA approved for NASAL specimens in patients 12 years of age and older), is one component of a comprehensive surveillance program. It is not intended to diagnose  infection nor to guide or monitor treatment. Performed at [facility], [address], [place], [place] [address]          Radiology Studies: PERIPHERAL VASCULAR CATHETERIZATION  Result Date: 06/25/2022 Images from the original result were not included.   Patient name: Chase [name]     MRN: [id number]        DOB: 03-10-1950          Sex: male  06/25/2022 Pre-operative Diagnosis: Critical limb ischemia of the right upper extremity with tissue loss with gangrene of the fingertips Post-operative diagnosis:  Same Surgeon:  Cephus Shelling, MD Procedure Performed: 1.  Ultrasound-guided access right common femoral artery 2.  Limited arch aortogram 3.  Catheter selection of right axillary artery with dedicated right upper extremity arteriogram and catheter selection of right radial artery 4.  Right radial artery angioplasty (2.5 mm x 80 mm Sterling) for high grade 90% stenosis 5.  Mynx closure of the right common femoral artery 6.  35 minutes of monitored moderate conscious sedation time  Indications: 72 year old male with end-stage renal disease that vascular surgery was consulted for ischemic changes to the right fingertips on Monday night.  He presents for arch aortogram with right upper extremity arteriogram and possible intervention after risks benefits discussed.  Findings:  Ultrasound-guided access right common femoral artery.  Arch aortogram showed patent arch vessels including innominate and right subclavian artery.  We then cannulated the innominate with a headhunter catheter and got a wire out into the right axillary artery and advanced our catheter into the right axillary artery.  Injections of the right upper extremity showed widely patent brachial artery.  He has significant disease below the elbow.  The radial artery was heavily calcified and tortuous and had a high-grade 90% stenosis in the proximal vessel.  There was also a second approximate 50% stenosis at the wrist.  Interosseous  was patent.  The ulnar artery was diffusely diseased calcified and small and occluded at the wrist with no filling of the palmar arch.  Significant small vessel disease in the hand with no digital vessels identified.  From right transfemoral access, I was able to angioplasty the proximal right radial artery stenosis  with a 2.5 mm x 80 mm Sterling with excellent results and widely patent proximal vessel.  We gave nitroglycerin throughout the case.  Tried to get down to the radial artery disease at the wrist into the hand and we did not have any balloon shaft long enough to reach lesion.  Brisk right radial artery signal at completion with preserved runoff into hand.  Procedure:  The patient was identified in the holding area and taken to room 8.  The patient was then placed supine on the table and prepped and draped in the usual sterile fashion.  A time out was called.  Ultrasound was used to evaluate the right common femoral artery.  It was patent .  A digital ultrasound image was acquired.  A micropuncture needle was used to access the right common femoral artery under ultrasound guidance.  An 018 wire was advanced without resistance and a micropuncture sheath was placed.  The 018 wire was removed and a benson wire was placed.  Pigtail catheter was advanced into the ascending aorta and an arch aortogram was given.  Prior to this we did give 5000 units of IV heparin.  After the arch aortogram used a headhunter catheter with a Glidewire advantage to cannulate the innominate I got my wire out through the subclavian and advanced the catheter into the right axillary artery.  We then gave nitro and got dedicated right upper extremity angiogram images down to the hand.  Pertinent findings are noted above.  He has significant disease distal to the brachial artery in the forearm and hand.  Ultimately I did not think intervention on the ulnar artery was an option as this essentially occluded at the wrist with no filling of  the palmar arch. I was able to get into the radial artery with a V18 and quick cross catheter in the proximal vessel and got dedicated hand injection imaging.  I then performed angioplasty of the proximal vessel with a 2.5 mm x 80 mm Sterling to nominal pressure for 2 minutes for the high-grade stenosis.  I was able to get my wire distally across the wrist for the second lesion but the balloon would not reach the lesion and this was the longest shaft length we had.  Essentially wires and catheters were removed and we put a short 5 French sheath right groin with a mynx closure device  Findings: Good results after angioplasty of high-grade proximal right radial stenosis at this is dominant runoff into the hand.  He still has significant small vessel disease in the hand and ulnar artery occludes at the wrist and is diminutive and diseased.  Aspirin statin for now.  Will start Plavix when okay from hand surgery.  Cephus Shelling, MD [facility] Office: [phone]        Scheduled Meds:  aspirin EC  81 mg Oral Daily   buprenorphine  2 mg Sublingual QHS   busPIRone  10 mg Oral BID   calcitRIOL  0.25 mcg Oral Daily   calcium acetate  667 mg Oral TID WC   Chlorhexidine Gluconate Cloth  6 each Topical Q0600   clopidogrel  75 mg Oral Daily   darbepoetin (ARANESP) injection - DIALYSIS  100 mcg Subcutaneous Q Thu-1800   escitalopram  20 mg Oral Daily   heparin  5,000 Units Subcutaneous Q8H   levothyroxine  250 mcg Oral QAC breakfast   melatonin  5 mg Oral QHS   pantoprazole  40 mg Oral Daily  rosuvastatin  10 mg Oral QHS   sodium chloride flush  3 mL Intravenous Q12H   Continuous Infusions:  sodium chloride     ferric gluconate (FERRLECIT) IVPB Stopped (06/26/22 1910)     LOS: 4 days    Time spent: 35 minutes    Ramiro Harvest, MD [facility]   To contact the attending provider between 7A-7P or the covering provider during after hours 7P-7A,  please log into the web site [email or web address] and access using universal [place] password for that web site. If you do not have the password, please call the hospital operator.  06/27/2022, 11:21 AM

## 2022-06-27 NOTE — Procedures (Signed)
Patient seen and examined on Hemodialysis. The procedure was supervised and I have made appropriate changes. BP (!) 89/78   Pulse 61   Temp 98.4 F (36.9 C)   Resp 19   Ht 5\' 6"  (1.676 m)   Wt 92.1 kg   SpO2 99%   BMI 32.77 kg/m   QB 400 mL/ min via L AVF, UF goal 2L  Tolerating treatment without complaints at this time.   Bufford Buttner MD [facility] [phone] 10:15 AM

## 2022-06-27 NOTE — Progress Notes (Signed)
   06/27/22 1254  Vitals  Temp 98 F (36.7 C)  Pulse Rate 76  Resp 16  BP (!) 121/52  SpO2 97 %  O2 Device Room Air  Weight 90.1 kg  Type of Weight Post-Dialysis  Post Treatment  Dialyzer Clearance Lightly streaked  Duration of HD Treatment -hour(s) 3.5 hour(s)  Hemodialysis Intake (mL) 0 mL  Liters Processed 83.9  Fluid Removed (mL) 2000 mL  Tolerated HD Treatment Yes  AVG/AVF Arterial Site Held (minutes) 10 minutes  AVG/AVF Venous Site Held (minutes) 10 minutes   Received patient in bed to unit.  Alert and oriented.  Informed consent signed and in chart.   TX duration:3.5  Patient tolerated well.  Transported back to the room  Alert, without acute distress.  Hand-off given to patient's nurse.   Access used: LUAF Access issues: no complications  Total UF removed: 2000 Medication(s) given: none   [name]

## 2022-06-27 NOTE — Progress Notes (Signed)
[facility] Progress Note   Assessment/ Plan:   Assessment/Plan: 1 R finger/ hand gangrene: VVS followings/p angio with PTA to R radial artery.  Needs hand surgery c/s 2 ESRD: TTS normally.  HD Wednesday.  Next HD Friday (today) off schedule and then back to normal schedule Saturday. 3 Hypertension/ volume: UF as able, appears grossly euvolemic 4. Anemia of ESRD: Hgb 8.0, on Aranesp and ferrlicet, Hgb 7.0, s/p 1 I pRBCs 5. Metabolic Bone Disease: binders/ vitamins when eating 6.  PD catheter- to be removed on Monday by VVS - greatly appreciate. 6.  Dispo: pending  Subjective:    Seen on HD.  Reports pain in both hands today.  No issues on HD    Objective:   BP (!) 89/78   Pulse 61   Temp 98.4 F (36.9 C)   Resp 19   Ht 5\' 6"  (1.676 m)   Wt 92.1 kg   SpO2 99%   BMI 32.77 kg/m   Physical Exam: GEN NAD, lying in bed HEENT EOMI PERRL NECK no JVD PULM clear CV RRR ABD soft EXT no LE edema NEURO slow to respond ACCESS: L RC AVF + T/B, PD catheter not taped up, has a dressing on exit site, fibrin in catheter  Labs: BMET Recent Labs  Lab 06/23/22 1727 06/23/22 1755 06/24/22 0122 06/25/22 0259 06/26/22 0127 06/27/22 0805  NA 134* 134* 135 133* 137 134*  K 4.6 4.5 5.0 5.2* 4.2 4.3  CL 100 97* 97* 98 97* 96*  CO2  --  26 25 22 27 24   GLUCOSE 127* 129* 92 88 84 106*  BUN 52* 56* 59* 66* 32* 46*  CREATININE 5.20* 4.99* 5.27* 6.44* 4.13* 5.63*  CALCIUM  --  8.6* 8.9 8.3* 8.3* 8.5*  PHOS  --   --   --  5.6* 4.4 6.0*   CBC Recent Labs  Lab 06/24/22 0122 06/25/22 0259 06/26/22 0127 06/26/22 1913 06/27/22 0805  WBC 8.5 7.0 5.7  --  7.1  NEUTROABS  --  5.1 3.8  --  5.2  HGB 8.0* 7.3* 7.0* 8.8* 7.8*  HCT 25.7* 23.9* 23.1* 28.4* 25.7*  MCV 101.2* 99.2 100.0  --  97.7  PLT 193 188 163  --  180      Medications:     aspirin EC  81 mg Oral Daily   buprenorphine  2 mg Sublingual QHS   busPIRone  10 mg Oral BID   calcitRIOL  0.25 mcg Oral Daily    calcium acetate  667 mg Oral TID WC   Chlorhexidine Gluconate Cloth  6 each Topical Q0600   clopidogrel  75 mg Oral Daily   darbepoetin (ARANESP) injection - DIALYSIS  100 mcg Subcutaneous Q Thu-1800   escitalopram  20 mg Oral Daily   heparin  5,000 Units Subcutaneous Q8H   levothyroxine  250 mcg Oral QAC breakfast   melatonin  5 mg Oral QHS   pantoprazole  40 mg Oral Daily   rosuvastatin  10 mg Oral QHS   sodium chloride flush  3 mL Intravenous Q12H     Bufford Buttner, MD 06/27/2022, 10:12 AM

## 2022-06-27 NOTE — TOC Progression Note (Addendum)
Transition of Care [facility]) - Progression Note    Patient Details  Name: Chase [name] MRN: [id number] Date of Birth: 19-Jul-1950  Transition of Care [facility]) CM/SW Contact  Eduard Roux, [place] Phone Number: 06/27/2022, 11:00 AM  Clinical Narrative:     Patient is off the unit. CSW spoke with the patient's spouse. 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 She states he will need PTAR.  [facility] confirmed the patient can return.  Antony Blackbird, MSW, LCSW Clinical Social Worker    Expected Discharge Plan: Skilled Nursing Facility Barriers to Discharge: Continued Medical Work up  Expected Discharge Plan and Services In-house Referral: Clinical Social Work     Living arrangements for the past 2 months: Skilled Nursing Facility                                       Social Determinants of Health (SDOH) Interventions SDOH Screenings   Food Insecurity: No Food Insecurity (06/23/2022)  Housing: Low Risk  (06/23/2022)  Transportation Needs: No Transportation Needs (06/23/2022)  Utilities: Not At Risk (06/23/2022)  Tobacco Use: Low Risk  (06/26/2022)    Readmission Risk Interventions     No data to display

## 2022-06-27 NOTE — Progress Notes (Signed)
PT Cancellation Note  Patient Details Name: Chase [name] MRN: [id number] DOB: 1950/05/23   Cancelled Treatment:    Reason Eval/Treat Not Completed: (P) Patient at procedure or test/unavailable Pt is off floor for HD. PT will follow back this afternoon as able.   Serjio Deupree B. Beverely Risen PT, DPT Acute Rehabilitation Services Please use secure chat or  Call Office [phone]    Elon Alas [facility] 06/27/2022, 10:26 AM

## 2022-06-28 DIAGNOSIS — E039 Hypothyroidism, unspecified: Secondary | ICD-10-CM | POA: Diagnosis not present

## 2022-06-28 DIAGNOSIS — I10 Essential (primary) hypertension: Secondary | ICD-10-CM | POA: Diagnosis not present

## 2022-06-28 DIAGNOSIS — N186 End stage renal disease: Secondary | ICD-10-CM | POA: Diagnosis not present

## 2022-06-28 DIAGNOSIS — I96 Gangrene, not elsewhere classified: Secondary | ICD-10-CM | POA: Diagnosis not present

## 2022-06-28 LAB — RENAL FUNCTION PANEL
Albumin: 2.3 g/dL — ABNORMAL LOW (ref 3.5–5.0)
Anion gap: 11 (ref 5–15)
BUN: 20 mg/dL (ref 8–23)
CO2: 27 mmol/L (ref 22–32)
Calcium: 8.7 mg/dL — ABNORMAL LOW (ref 8.9–10.3)
Chloride: 95 mmol/L — ABNORMAL LOW (ref 98–111)
Creatinine, Ser: 3.41 mg/dL — ABNORMAL HIGH (ref 0.61–1.24)
GFR, Estimated: 19 mL/min — ABNORMAL LOW
Glucose, Bld: 91 mg/dL (ref 70–99)
Phosphorus: 3.5 mg/dL (ref 2.5–4.6)
Potassium: 3.4 mmol/L — ABNORMAL LOW (ref 3.5–5.1)
Sodium: 133 mmol/L — ABNORMAL LOW (ref 135–145)

## 2022-06-28 LAB — MAGNESIUM: Magnesium: 1.4 mg/dL — ABNORMAL LOW (ref 1.7–2.4)

## 2022-06-28 LAB — URINE CULTURE: Culture: NO GROWTH

## 2022-06-28 LAB — CBC
HCT: 25.7 % — ABNORMAL LOW (ref 39.0–52.0)
Hemoglobin: 8 g/dL — ABNORMAL LOW (ref 13.0–17.0)
MCH: 30.2 pg (ref 26.0–34.0)
MCHC: 31.1 g/dL (ref 30.0–36.0)
MCV: 97 fL (ref 80.0–100.0)
Platelets: 162 K/uL (ref 150–400)
RBC: 2.65 MIL/uL — ABNORMAL LOW (ref 4.22–5.81)
RDW: 15.5 % (ref 11.5–15.5)
WBC: 6.5 K/uL (ref 4.0–10.5)
nRBC: 0 % (ref 0.0–0.2)

## 2022-06-28 MED ORDER — ONDANSETRON HCL 4 MG PO TABS: 4.0000 mg | ORAL_TABLET | Freq: Four times a day (QID) | ORAL | Status: DC | PRN

## 2022-06-28 MED ORDER — ZOLPIDEM TARTRATE 5 MG PO TABS: 5.0000 mg | ORAL_TABLET | Freq: Every evening | ORAL | Status: DC | PRN

## 2022-06-28 MED ORDER — HYDROXYZINE HCL 25 MG PO TABS: 25.0000 mg | ORAL_TABLET | Freq: Three times a day (TID) | ORAL | Status: DC | PRN

## 2022-06-28 MED ORDER — ACETAMINOPHEN 325 MG PO TABS
650.0000 mg | ORAL_TABLET | Freq: Four times a day (QID) | ORAL | Status: DC | PRN
  Administered 2022-06-29 – 2022-07-02 (×5): 650 mg via ORAL
  Filled 2022-06-28 (×5): qty 2

## 2022-06-28 MED ORDER — ONDANSETRON HCL 4 MG/2ML IJ SOLN
4.0000 mg | Freq: Four times a day (QID) | INTRAMUSCULAR | Status: DC | PRN
  Administered 2022-06-30: 4 mg via INTRAVENOUS
  Filled 2022-06-28: qty 2

## 2022-06-28 MED ORDER — ACETAMINOPHEN 650 MG RE SUPP: 650.0000 mg | Freq: Four times a day (QID) | RECTAL | Status: DC | PRN

## 2022-06-28 MED ORDER — CAMPHOR-MENTHOL 0.5-0.5 % EX LOTN
1.0000 | TOPICAL_LOTION | Freq: Three times a day (TID) | CUTANEOUS | Status: DC | PRN
  Filled 2022-06-28: qty 222

## 2022-06-28 MED ORDER — DOCUSATE SODIUM 283 MG RE ENEM
1.0000 | ENEMA | RECTAL | Status: DC | PRN
  Filled 2022-06-28: qty 1

## 2022-06-28 MED ORDER — POTASSIUM CHLORIDE CRYS ER 10 MEQ PO TBCR
20.0000 meq | EXTENDED_RELEASE_TABLET | Freq: Once | ORAL | Status: AC
  Administered 2022-06-28: 20 meq via ORAL
  Filled 2022-06-28: qty 2

## 2022-06-28 MED ORDER — SORBITOL 70 % SOLN: 30.0000 mL | Status: DC | PRN

## 2022-06-28 MED ORDER — HEPARIN SODIUM (PORCINE) 1000 UNIT/ML IJ SOLN
3000.0000 [IU] | Freq: Once | INTRAMUSCULAR | Status: DC
  Filled 2022-06-28: qty 3

## 2022-06-28 MED ORDER — NEPRO/CARBSTEADY PO LIQD: 237.0000 mL | Freq: Three times a day (TID) | ORAL | Status: DC | PRN

## 2022-06-28 MED ORDER — CALCIUM CARBONATE ANTACID 1250 MG/5ML PO SUSP
500.0000 mg | Freq: Four times a day (QID) | ORAL | Status: DC | PRN
  Filled 2022-06-28: qty 5

## 2022-06-28 NOTE — Progress Notes (Signed)
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  He was admitted to [facility] from 5 /1 until 05/13/2022 due to uremia from that time he was transitioned from PD to HD. He is just from the hospital stay to nursing home regimen overall doing well per patient and his wife.  -About 3 weeks ago he started having pain in his right hand  and progressively gotten worse and he was sent to the ED on 06/04/2022 for complaints of right fingertip pain and at that time he had good capillary refill images reviewed from that day and he had an arterial Doppler on that day without significant stenosis or impaired blood flow. He discharged back to the nursing facility but since then the pain has continued and worsened and he developed dark discoloration of the right ring finger on his right hand and ulcer. Upon evaluation of the ED he is moderately hypertensive and lab work was done. Vascular surgery and hand surgery was consulted and vascular surgery evaluated and are planning a angiogram with a transfemoral approach with a focus in the right upper extremity to evaluate his inflow.    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 -Patient s/p arch aortogram with right upper extremity arteriogram and right radial artery angioplasty on 06/25/2022 per Dr. Chestine Spore.   -Per vascular surgery patient optimized and recommending continuation of aspirin and statin and starting Plavix when okay from hand surgery point of view.   -Discussed with PA, Earney Hamburg, covering for orthopedics/hand surgery who recommended resumption of Plavix with outpatient follow-up with Dr. Yehuda Budd, hand surgeon in the next 1 to 2 weeks for further management.   -Continue Plavix 75 mg daily.   -Vascular surgery following. -Supportive care, pain management.  2.  ESRD on HD TTS -Patient seen in consultation by nephrology, underwent hemodialysis on 06/25/2022, will undergo hemodialysis, today 06/27/2022 per nephrology and then back to regular schedule today..   -Per nephrology PD catheter to be removed on Monday by vascular surgery. -Per nephrology.  3.  Chronic back and neck pain -Continue home regimen buprenorphine 2 mg SL at bedtime, oxycodone 5 mg p.o. every 4 hours as needed moderate pain and IV fentanyl 50 mcg every hour as needed moderate pain.  4.  Hypertension -Currently stable. -Demadex on hold.  5.  Hypothyroidism -Continue Synthroid.    6.  Depression/anxiety -Lexapro, BuSpar.    7.  Anemia of chronic kidney disease -Hemoglobin currently at 8.0 from 7.8 from 7.0 from 8.5 on admission.  -Status post transfusion 1 unit PRBCs 06/26/2022 -Anemia panel with iron level of  19, TIBC of 164, ferritin of 793, folate of 11.8. -Ferrlecit iron load ordered per nephrology, aranesp in HD. -Per nephrology.  8.  GERD -PPI.   9.  Obesity -BMI 33.52 kg/m. -Lifestyle modification -Outpatient follow-up with PCP.  10.  Hyponatremia -On HD. -Stable.  11.  Hypomagnesemia -Magnesium of 1.4. -Will defer replacement to  nephrology. -Patient for hemodialysis today.   DVT prophylaxis: Heparin Code Status: Full Family Communication: Updated patient.  No family at bedside. Disposition: TBD  Status is: Inpatient Remains inpatient appropriate because: Severity of illness   Consultants:  Hand surgeon/orthopedics: Dr. Yehuda Budd 06/24/2022 Vascular surgery: Dr. Chestine Spore 06/23/2022 Nephrology: Dr. Signe Colt 06/24/2022  Procedures:  Transfuse 1 unit PRBCs 06/26/2022  Antimicrobials:  Anti-infectives (From admission, onward)    None         Subjective: Sitting up in chair about to eat his breakfast.  Denies any chest pain or shortness of breath.  No abdominal pain.  Still with complaints of right hand pain however seems improved than on admission.  Hoping to take a break from hemodialysis today as he states he had dialysis yesterday.   Objective: Vitals:   06/27/22 1600 06/27/22 1934 06/28/22 0355 06/28/22 0904  BP: (!) 134/52 118/69 134/73 113/66  Pulse: 70 69 66 69  Resp: 16 19 16 18   Temp: 98.8 F (37.1 C) 98.4 F (36.9 C) 98.7 F (37.1 C) 98.5 F (36.9 C)  TempSrc: Oral Oral Oral Oral  SpO2: 91% 98% 91% 98%  Weight:      Height:        Intake/Output Summary (Last 24 hours) at 06/28/2022 1043 Last data filed at 06/28/2022 0008 Gross per 24 hour  Intake 270 ml  Output 2000 ml  Net -1730 ml    Filed Weights   06/25/22 1337 06/27/22 0815 06/27/22 1254  Weight: 92.2 kg 92.1 kg 90.1 kg    Examination:  General exam: NAD Respiratory system: Lungs clear to auscultation bilaterally.  No wheezes, no crackles, no rhonchi.  Normal respiratory effort.  Speaking in full sentences.   Cardiovascular system: RRR no murmurs rubs or gallops.  No JVD.  No lower extremity edema.  Gastrointestinal system: Abdomen is soft, nontender, nondistended, positive bowel sounds.  No rebound.  No guarding.  Central nervous system: Alert and oriented. No focal neurological deficits. Extremities: Right ring finger with  dry gangrene with some streaking erythema up the hand.  Ring finger and index finger less tender to palpation.  Right index finger with some necrotic tissue on the tip..  Symmetric 5 x 5 power. Skin: No rashes, lesions or ulcers Psychiatry: Judgement and insight appear normal. Mood & affect appropriate.     Data Reviewed: I have personally reviewed following labs and imaging studies  CBC: Recent Labs  Lab 06/24/22 0122 06/25/22 0259 06/26/22 0127 06/26/22 1913 06/27/22 0805 06/28/22 0130  WBC 8.5 7.0 5.7  --  7.1 6.5  NEUTROABS  --  5.1 3.8  --  5.2  --   HGB 8.0* 7.3* 7.0* 8.8* 7.8* 8.0*  HCT 25.7* 23.9* 23.1* 28.4* 25.7* 25.7*  MCV 101.2* 99.2 100.0  --  97.7 97.0  PLT 193 188 163  --  180 162     Basic Metabolic Panel: Recent Labs  Lab 06/24/22 0122 06/25/22 0259 06/26/22 0127 06/27/22 0805 06/28/22 0130  NA 135 133* 137 134* 133*  K 5.0 5.2* 4.2 4.3 3.4*  CL 97* 98 97* 96* 95*  CO2 25 22 27 24 27   GLUCOSE  92 88 84 106* 91  BUN 59* 66* 32* 46* 20  CREATININE 5.27* 6.44* 4.13* 5.63* 3.41*  CALCIUM 8.9 8.3* 8.3* 8.5* 8.7*  MG  --  1.4* 1.4* 1.6* 1.4*  PHOS  --  5.6* 4.4 6.0* 3.5     GFR: Estimated Creatinine Clearance: 21.2 mL/min (A) (by C-G formula based on SCr of 3.41 mg/dL (H)).  Liver Function Tests: Recent Labs  Lab 06/23/22 1755 06/25/22 0259 06/26/22 0127 06/27/22 0805 06/28/22 0130  AST 14* 19  --   --   --   ALT 12 11  --   --   --   ALKPHOS 138* 113  --   --   --   BILITOT 0.6 0.3  --   --   --   PROT 6.1* 5.8*  --   --   --   ALBUMIN 2.5* 2.3* 2.1* 2.3* 2.3*     CBG: No results for input(s): "GLUCAP" in the last 168 hours.   Recent Results (from the past 240 hour(s))  Surgical PCR screen     Status: None   Collection Time: 06/23/22  9:44 PM   Specimen: Nasal Mucosa; Nasal Swab  Result Value Ref Range Status   MRSA, PCR NEGATIVE NEGATIVE Final   Staphylococcus aureus NEGATIVE NEGATIVE Final    Comment: (NOTE) The Xpert SA  Assay (FDA approved for NASAL specimens in patients 64 years of age and older), is one component of a comprehensive surveillance program. It is not intended to diagnose infection nor to guide or monitor treatment. Performed at [facility], [address], [place], [place] [address]   Urine Culture (for pregnant, neutropenic or urologic patients or patients with an indwelling urinary catheter)     Status: None   Collection Time: 06/27/22  8:07 AM   Specimen: Urine, Clean Catch  Result Value Ref Range Status   Specimen Description URINE, CLEAN CATCH  Final   Special Requests NONE  Final   Culture   Final    NO GROWTH Performed at [facility], [address], [place], [place] [address]    Report Status 06/28/2022 FINAL  Final         Radiology Studies: No results found.      Scheduled Meds:  aspirin EC  81 mg Oral Daily   buprenorphine  2 mg Sublingual QHS   busPIRone  10 mg Oral BID   calcitRIOL  0.25 mcg Oral Daily   calcium acetate  667 mg Oral TID WC   Chlorhexidine Gluconate Cloth  6 each Topical Q0600   clopidogrel  75 mg Oral Daily   darbepoetin (ARANESP) injection - DIALYSIS  100 mcg Subcutaneous Q Thu-1800   escitalopram  20 mg Oral Daily   heparin  5,000 Units Subcutaneous Q8H   levothyroxine  250 mcg Oral QAC breakfast   melatonin  5 mg Oral QHS   pantoprazole  40 mg Oral Daily   rosuvastatin  10 mg Oral QHS   sodium chloride flush  3 mL Intravenous Q12H   Continuous Infusions:  sodium chloride     ferric gluconate (FERRLECIT) IVPB Stopped (06/27/22 1739)     LOS: 5 days    Time spent: 35 minutes    Ramiro Harvest, MD [facility]   To contact the attending provider between 7A-7P or the covering provider during after hours 7P-7A, please log into the web site [email or web address] and access using universal [place] password for that web site.  If you do not have the password, please call the hospital operator.  06/28/2022, 10:43  AM

## 2022-06-28 NOTE — Progress Notes (Signed)
[facility] Progress Note   Assessment/ Plan:   Assessment/Plan: 1 R finger/ hand gangrene: VVS followings/p angio with PTA to R radial artery.  Needs hand surgery c/s 2 ESRD: TTS normally.  HD Wednesday.  Next HD Friday (today) off schedule and then back to normal schedule Saturday (today). 3 Hypertension/ volume: UF as able, appears grossly euvolemic 4. Anemia of ESRD: Hgb 8.0, on Aranesp and ferrlicet, Hgb 7.0, s/p 1 I pRBCs, now Hgb 8.0 5. Metabolic Bone Disease: binders/ vitamins when eating 6.  PD catheter- to be removed on Monday by VVS - greatly appreciate. 7.  Dispo: pending  Subjective:    Seen in room.  Did well with HD yesterday, now back on HD schedule for today   Objective:   BP 113/66 (BP Location: Right Leg)   Pulse 69   Temp 98.5 F (36.9 C) (Oral)   Resp 18   Ht 5\' 6"  (1.676 m)   Wt 90.1 kg   SpO2 98%   BMI 32.06 kg/m   Physical Exam: GEN NAD, lying in bed HEENT EOMI PERRL NECK no JVD PULM clear CV RRR ABD soft EXT no LE edema NEURO slow to respond ACCESS: L RC AVF + T/B, PD catheter not taped up, has a dressing on exit site, fibrin in catheter  Labs: BMET Recent Labs  Lab 06/23/22 1727 06/23/22 1755 06/24/22 0122 06/25/22 0259 06/26/22 0127 06/27/22 0805 06/28/22 0130  NA 134* 134* 135 133* 137 134* 133*  K 4.6 4.5 5.0 5.2* 4.2 4.3 3.4*  CL 100 97* 97* 98 97* 96* 95*  CO2  --  26 25 22 27 24 27   GLUCOSE 127* 129* 92 88 84 106* 91  BUN 52* 56* 59* 66* 32* 46* 20  CREATININE 5.20* 4.99* 5.27* 6.44* 4.13* 5.63* 3.41*  CALCIUM  --  8.6* 8.9 8.3* 8.3* 8.5* 8.7*  PHOS  --   --   --  5.6* 4.4 6.0* 3.5   CBC Recent Labs  Lab 06/25/22 0259 06/26/22 0127 06/26/22 1913 06/27/22 0805 06/28/22 0130  WBC 7.0 5.7  --  7.1 6.5  NEUTROABS 5.1 3.8  --  5.2  --   HGB 7.3* 7.0* 8.8* 7.8* 8.0*  HCT 23.9* 23.1* 28.4* 25.7* 25.7*  MCV 99.2 100.0  --  97.7 97.0  PLT 188 163  --  180 162      Medications:     aspirin EC  81 mg  Oral Daily   buprenorphine  2 mg Sublingual QHS   busPIRone  10 mg Oral BID   calcitRIOL  0.25 mcg Oral Daily   calcium acetate  667 mg Oral TID WC   Chlorhexidine Gluconate Cloth  6 each Topical Q0600   clopidogrel  75 mg Oral Daily   darbepoetin (ARANESP) injection - DIALYSIS  100 mcg Subcutaneous Q Thu-1800   escitalopram  20 mg Oral Daily   heparin  5,000 Units Subcutaneous Q8H   levothyroxine  250 mcg Oral QAC breakfast   melatonin  5 mg Oral QHS   pantoprazole  40 mg Oral Daily   rosuvastatin  10 mg Oral QHS   sodium chloride flush  3 mL Intravenous Q12H     Bufford Buttner, MD 06/28/2022, 10:16 AM

## 2022-06-28 NOTE — Progress Notes (Signed)
Called Dr. Signe Colt to let her know that pt is wanting to miss HD today per Dr. Janee Morn. She says he has right to refuse but will not get HD again till Tuesday. I will discuss with pt. Pt resting with call bell within reach.  Will continue to monitor.

## 2022-06-28 NOTE — Procedures (Signed)
HD Note:  Some information was entered later than the data was gathered due to patient care needs. The stated time with the data is accurate.  Received patient in bed to unit.  Alert and oriented to person and situation Informed consent signed and in chart.   TX duration: 3.5 hours  Patient tolerated well.  He did have pain in his right hand that was treated, see MAR   Transported back to the room  Alert, without acute distress.  Hand-off given to patient's nurse.   Access used: Upper left arm fistula Access issues: None  Total UF removed: 1000 ml    [facility]

## 2022-06-29 DIAGNOSIS — N186 End stage renal disease: Secondary | ICD-10-CM | POA: Diagnosis not present

## 2022-06-29 DIAGNOSIS — I96 Gangrene, not elsewhere classified: Secondary | ICD-10-CM | POA: Diagnosis not present

## 2022-06-29 DIAGNOSIS — I10 Essential (primary) hypertension: Secondary | ICD-10-CM | POA: Diagnosis not present

## 2022-06-29 DIAGNOSIS — Z992 Dependence on renal dialysis: Secondary | ICD-10-CM | POA: Diagnosis not present

## 2022-06-29 DIAGNOSIS — E039 Hypothyroidism, unspecified: Secondary | ICD-10-CM | POA: Diagnosis not present

## 2022-06-29 LAB — RENAL FUNCTION PANEL
Albumin: 2.4 g/dL — ABNORMAL LOW (ref 3.5–5.0)
Anion gap: 8 (ref 5–15)
BUN: 11 mg/dL (ref 8–23)
CO2: 29 mmol/L (ref 22–32)
Calcium: 8.7 mg/dL — ABNORMAL LOW (ref 8.9–10.3)
Chloride: 96 mmol/L — ABNORMAL LOW (ref 98–111)
Creatinine, Ser: 2.4 mg/dL — ABNORMAL HIGH (ref 0.61–1.24)
GFR, Estimated: 28 mL/min — ABNORMAL LOW (ref >60)
Glucose, Bld: 110 mg/dL — ABNORMAL HIGH (ref 70–99)
Phosphorus: 2.5 mg/dL (ref 2.5–4.6)
Potassium: 3.4 mmol/L — ABNORMAL LOW (ref 3.5–5.1)
Sodium: 133 mmol/L — ABNORMAL LOW (ref 135–145)

## 2022-06-29 LAB — MAGNESIUM: Magnesium: 1.4 mg/dL — ABNORMAL LOW (ref 1.7–2.4)

## 2022-06-29 LAB — HEMOGLOBIN AND HEMATOCRIT, BLOOD
HCT: 27.3 % — ABNORMAL LOW (ref 39.0–52.0)
Hemoglobin: 8.5 g/dL — ABNORMAL LOW (ref 13.0–17.0)

## 2022-06-29 MED ORDER — HEPARIN SODIUM (PORCINE) 5000 UNIT/ML IJ SOLN
5000.0000 [IU] | Freq: Three times a day (TID) | INTRAMUSCULAR | Status: AC
  Administered 2022-06-29 – 2022-06-30 (×3): 5000 [IU] via SUBCUTANEOUS
  Filled 2022-06-29 (×3): qty 1

## 2022-06-29 MED ORDER — HEPARIN SODIUM (PORCINE) 5000 UNIT/ML IJ SOLN
5000.0000 [IU] | Freq: Three times a day (TID) | INTRAMUSCULAR | Status: DC
  Administered 2022-06-30 – 2022-07-02 (×5): 5000 [IU] via SUBCUTANEOUS
  Filled 2022-06-29 (×5): qty 1

## 2022-06-29 MED ORDER — CEFAZOLIN SODIUM-DEXTROSE 1-4 GM/50ML-% IV SOLN
1.0000 g | INTRAVENOUS | Status: AC
  Administered 2022-06-30: 2 g via INTRAVENOUS
  Filled 2022-06-29: qty 50

## 2022-06-29 NOTE — Progress Notes (Signed)
[facility] Progress Note   Assessment/ Plan:   Assessment/Plan: 1 R finger/ hand gangrene: VVS followings/p angio with PTA to R radial artery. Hand following, demarcating 2 ESRD: TTS normally.  HD Wednesday, Friday, and then on schedule Sat.  Next HD Tuesday 6/25 3 Hypertension/ volume: UF as able, appears grossly euvolemic 4. Anemia of ESRD: Hgb 8.0, on Aranesp and ferrlicet, Hgb 7.0, s/p 1 I pRBCs, now Hgb 8.0--> 8.5 5. Metabolic Bone Disease: binders/ vitamins when eating 6.  PD catheter- to be removed on Monday by VVS - greatly appreciate. 7.  Dispo: pending  Subjective:    HD yesterday, did well.  Using AVF without issue   Objective:   BP (!) 148/57 (BP Location: Right Leg)   Pulse 66   Temp 98.5 F (36.9 C) (Oral)   Resp 16   Ht 5\' 6"  (1.676 m)   Wt 88.5 kg   SpO2 93%   BMI 31.49 kg/m   Physical Exam: GEN NAD, lying in bed HEENT EOMI PERRL NECK no JVD PULM clear CV RRR ABD soft EXT no LE edema, R hand fingers demarcating NEURO slow to respond ACCESS: L RC AVF + T/B, PD catheter not taped up, has a dressing on exit site, fibrin in catheter  Labs: BMET Recent Labs  Lab 06/23/22 1755 06/24/22 0122 06/25/22 0259 06/26/22 0127 06/27/22 0805 06/28/22 0130 06/29/22 0123  NA 134* 135 133* 137 134* 133* 133*  K 4.5 5.0 5.2* 4.2 4.3 3.4* 3.4*  CL 97* 97* 98 97* 96* 95* 96*  CO2 26 25 22 27 24 27 29   GLUCOSE 129* 92 88 84 106* 91 110*  BUN 56* 59* 66* 32* 46* 20 11  CREATININE 4.99* 5.27* 6.44* 4.13* 5.63* 3.41* 2.40*  CALCIUM 8.6* 8.9 8.3* 8.3* 8.5* 8.7* 8.7*  PHOS  --   --  5.6* 4.4 6.0* 3.5 2.5   CBC Recent Labs  Lab 06/25/22 0259 06/26/22 0127 06/26/22 1913 06/27/22 0805 06/28/22 0130 06/29/22 0123  WBC 7.0 5.7  --  7.1 6.5  --   NEUTROABS 5.1 3.8  --  5.2  --   --   HGB 7.3* 7.0* 8.8* 7.8* 8.0* 8.5*  HCT 23.9* 23.1* 28.4* 25.7* 25.7* 27.3*  MCV 99.2 100.0  --  97.7 97.0  --   PLT 188 163  --  180 162  --       Medications:      aspirin EC  81 mg Oral Daily   buprenorphine  2 mg Sublingual QHS   busPIRone  10 mg Oral BID   calcitRIOL  0.25 mcg Oral Daily   calcium acetate  667 mg Oral TID WC   Chlorhexidine Gluconate Cloth  6 each Topical Q0600   clopidogrel  75 mg Oral Daily   darbepoetin (ARANESP) injection - DIALYSIS  100 mcg Subcutaneous Q Thu-1800   escitalopram  20 mg Oral Daily   heparin  5,000 Units Subcutaneous Q8H   [START ON 06/30/2022] heparin injection (subcutaneous)  5,000 Units Subcutaneous Q8H   heparin sodium (porcine)  3,000 Units Intravenous Once   levothyroxine  250 mcg Oral QAC breakfast   melatonin  5 mg Oral QHS   pantoprazole  40 mg Oral Daily   rosuvastatin  10 mg Oral QHS   sodium chloride flush  3 mL Intravenous Q12H     Bufford Buttner, MD 06/29/2022, 7:56 AM

## 2022-06-29 NOTE — Progress Notes (Signed)
Pts wife at bedside and signed consent for procedure tomorrow as pt right hand was his writing hand. Wife would like to be updated if any change to scheduled time for procedure. I explained currently on schedule for 1:15 pm and probably pick up an hour early. This is subject to change based on emergencies and cancellations. Pt/wife verbalized understanding. Pt resting with call bell within reach.  Will continue to monitor.

## 2022-06-29 NOTE — Progress Notes (Signed)
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  He was admitted to [facility] from 5 /1 until 05/13/2022 due to uremia from that time he was transitioned from PD to HD. He is just from the hospital stay to nursing home regimen overall doing well per patient and his wife.  -About 3 weeks ago he started having pain in his right hand  and progressively gotten worse and he was sent to the ED on 06/04/2022 for complaints of right fingertip pain and at that time he had good capillary refill images reviewed from that day and he had an arterial Doppler on that day without significant stenosis or impaired blood flow. He discharged back to the nursing facility but since then the pain has continued and worsened and he developed dark discoloration of the right ring finger on his right hand and ulcer. Upon evaluation of the ED he is moderately hypertensive and lab work was done. Vascular surgery and hand surgery was consulted and vascular surgery evaluated and are planning a angiogram with a transfemoral approach with a focus in the right upper extremity to evaluate his inflow.    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 -Patient s/p arch aortogram with right upper extremity arteriogram and right radial artery angioplasty on 06/25/2022 per Dr. Chestine Spore.   -Per vascular surgery patient optimized and recommending continuation of aspirin and statin and starting Plavix when okay from hand surgery point of view.   -Discussed with PA, Earney Hamburg, covering for orthopedics/hand surgery who recommended resumption of Plavix with outpatient follow-up with Dr. Yehuda Budd, hand surgeon in the next 1 to 2 weeks for further management.   -Continue Plavix 75 mg daily.   -Vascular surgery following. -Supportive care, pain management.  2.  ESRD on HD TTS -Patient seen in consultation by nephrology, underwent hemodialysis on 06/25/2022, will undergo hemodialysis, today 06/27/2022 per nephrology and then back to regular schedule today..   -Per nephrology PD catheter to be removed tomorrow, Monday by vascular surgery. -Per nephrology.  3.  Chronic back and neck pain -Continue home regimen buprenorphine 2 mg SL at bedtime, oxycodone 5 mg p.o. every 4 hours as needed moderate pain and IV fentanyl 50 mcg every hour as needed moderate pain.  4.  Hypertension -Currently stable. -Demadex on hold.  5.  Hypothyroidism -Synthroid.  6.  Depression/anxiety -BuSpar, Lexapro.    7.  Anemia of chronic kidney disease -Hemoglobin currently at 8.5 from 8.0 from 7.8 from 7.0 from 8.5 on admission.  -Status post transfusion 1 unit PRBCs 06/26/2022 -Anemia panel with iron level of 19,  TIBC of 164, ferritin of 793, folate of 11.8. -Ferrlecit iron load ordered per nephrology, aranesp in HD. -Per nephrology.  8.  GERD -PPI.   9.  Obesity -BMI 33.52 kg/m. -Lifestyle modification -Outpatient follow-up with PCP.  10.  Hyponatremia -On HD. -Stable.  11.  Hypomagnesemia -Magnesium of 1.4. -Will defer replacement  to nephrology.   DVT prophylaxis: Heparin Code Status: Full Family Communication: Updated patient.  No family at bedside. Disposition: TBD  Status is: Inpatient Remains inpatient appropriate because: Severity of illness   Consultants:  Hand surgeon/orthopedics: Dr. Yehuda Budd 06/24/2022 Vascular surgery: Dr. Chestine Spore 06/23/2022 Nephrology: Dr. Signe Colt 06/24/2022  Procedures:  Transfuse 1 unit PRBCs 06/26/2022  Antimicrobials:  Anti-infectives (From admission, onward)    Start     Dose/Rate Route Frequency Ordered Stop   06/30/22 0800  ceFAZolin (ANCEF) IVPB 1 g/50 mL premix       Note to Pharmacy: Send with pt to OR   1 g 100 mL/hr over 30 Minutes Intravenous On call 06/29/22 0717 07/01/22 0800         Subjective: Sitting up in chair.  States he feels well.  States hemodialysis went well yesterday.  Denies any chest pain.  No shortness of breath.  No abdominal pain.  Still with pain in his right fingers.  Tolerating current diet.   Objective: Vitals:   06/28/22 2336 06/29/22 0335 06/29/22 0743 06/29/22 1713  BP: 134/63 (!) 148/57 (!) 148/63 138/60  Pulse: 67 66 70 78  Resp:   13 20  Temp: 98.1 F (36.7 C) 98.5 F (36.9 C) 98.4 F (36.9 C) 98.4 F (36.9 C)  TempSrc: Oral Oral Oral Oral  SpO2: 93% 93% 95% 97%  Weight:      Height:        Intake/Output Summary (Last 24 hours) at 06/29/2022 1830 Last data filed at 06/28/2022 2200 Gross per 24 hour  Intake 240 ml  Output --  Net 240 ml    Filed Weights   06/27/22 0815 06/27/22 1254 06/28/22 1811  Weight: 92.1 kg 90.1 kg 88.5 kg    Examination:  General exam: NAD Respiratory system: CTAB.  No wheezes, no crackles, no rhonchi.  Normal respiratory effort.  Speaking full sentences.   Cardiovascular system: Regular rate rhythm no murmurs rubs or gallops.  No JVD.  No lower extremity edema.  Gastrointestinal system: Abdomen is soft, nontender, nondistended, positive bowel sounds.  No rebound.  No guarding.  Central  nervous system: Alert and oriented. No focal neurological deficits. Extremities: Right ring finger with dry gangrene with some streaking erythema up the hand.  Ring finger and index finger less tender to palpation.  Right index finger with some necrotic tissue on the tip..  Symmetric 5 x 5 power. Skin: No rashes, lesions or ulcers Psychiatry: Judgement and insight appear normal. Mood & affect appropriate.     Data Reviewed: I have personally reviewed following labs and imaging studies  CBC: Recent Labs  Lab 06/24/22 0122 06/25/22 0259 06/26/22 0127 06/26/22 1913 06/27/22 0805 06/28/22 0130 06/29/22 0123  WBC 8.5 7.0 5.7  --  7.1 6.5  --   NEUTROABS  --  5.1 3.8  --  5.2  --   --   HGB 8.0* 7.3* 7.0* 8.8* 7.8* 8.0* 8.5*  HCT 25.7* 23.9* 23.1* 28.4* 25.7* 25.7* 27.3*  MCV 101.2* 99.2 100.0  --  97.7 97.0  --   PLT 193 188 163  --  180 162  --  Basic Metabolic Panel: Recent Labs  Lab 06/25/22 0259 06/26/22 0127 06/27/22 0805 06/28/22 0130 06/29/22 0123  NA 133* 137 134* 133* 133*  K 5.2* 4.2 4.3 3.4* 3.4*  CL 98 97* 96* 95* 96*  CO2 22 27 24 27 29   GLUCOSE 88 84 106* 91 110*  BUN 66* 32* 46* 20 11  CREATININE 6.44* 4.13* 5.63* 3.41* 2.40*  CALCIUM 8.3* 8.3* 8.5* 8.7* 8.7*  MG 1.4* 1.4* 1.6* 1.4* 1.4*  PHOS 5.6* 4.4 6.0* 3.5 2.5     GFR: Estimated Creatinine Clearance: 29.9 mL/min (A) (by C-G formula based on SCr of 2.4 mg/dL (H)).  Liver Function Tests: Recent Labs  Lab 06/23/22 1755 06/25/22 0259 06/26/22 0127 06/27/22 0805 06/28/22 0130 06/29/22 0123  AST 14* 19  --   --   --   --   ALT 12 11  --   --   --   --   ALKPHOS 138* 113  --   --   --   --   BILITOT 0.6 0.3  --   --   --   --   PROT 6.1* 5.8*  --   --   --   --   ALBUMIN 2.5* 2.3* 2.1* 2.3* 2.3* 2.4*     CBG: No results for input(s): "GLUCAP" in the last 168 hours.   Recent Results (from the past 240 hour(s))  Surgical PCR screen     Status: None   Collection Time: 06/23/22  9:44  PM   Specimen: Nasal Mucosa; Nasal Swab  Result Value Ref Range Status   MRSA, PCR NEGATIVE NEGATIVE Final   Staphylococcus aureus NEGATIVE NEGATIVE Final    Comment: (NOTE) The Xpert SA Assay (FDA approved for NASAL specimens in patients 44 years of age and older), is one component of a comprehensive surveillance program. It is not intended to diagnose infection nor to guide or monitor treatment. Performed at [facility], [address], [place], [place] [address]   Urine Culture (for pregnant, neutropenic or urologic patients or patients with an indwelling urinary catheter)     Status: None   Collection Time: 06/27/22  8:07 AM   Specimen: Urine, Clean Catch  Result Value Ref Range Status   Specimen Description URINE, CLEAN CATCH  Final   Special Requests NONE  Final   Culture   Final    NO GROWTH Performed at [facility], [address] [place], [place] [address]    Report Status 06/28/2022 FINAL  Final         Radiology Studies: No results found.      Scheduled Meds:  aspirin EC  81 mg Oral Daily   buprenorphine  2 mg Sublingual QHS   busPIRone  10 mg Oral BID   calcitRIOL  0.25 mcg Oral Daily   calcium acetate  667 mg Oral TID WC   Chlorhexidine Gluconate Cloth  6 each Topical Q0600   clopidogrel  75 mg Oral Daily   darbepoetin (ARANESP) injection - DIALYSIS  100 mcg Subcutaneous Q Thu-1800   escitalopram  20 mg Oral Daily   heparin  5,000 Units Subcutaneous Q8H   [START ON 06/30/2022] heparin injection (subcutaneous)  5,000 Units Subcutaneous Q8H   heparin sodium (porcine)  3,000 Units Intravenous Once   levothyroxine  250 mcg Oral QAC breakfast   melatonin  5 mg Oral QHS   pantoprazole  40 mg Oral Daily   rosuvastatin  10 mg Oral QHS  sodium chloride flush  3 mL Intravenous Q12H   Continuous Infusions:  sodium chloride     [START ON 06/30/2022]  ceFAZolin (ANCEF) IV       LOS: 6 days    Time spent: 35 minutes    Ramiro Harvest,  MD [facility]   To contact the attending provider between 7A-7P or the covering provider during after hours 7P-7A, please log into the web site [email or web address] and access using universal [place] password for that web site. If you do not have the password, please call the hospital operator.  06/29/2022, 6:30 PM

## 2022-06-29 NOTE — Progress Notes (Addendum)
  Progress Note    06/29/2022 6:41 AM 4 Days Post-Op  Subjective:  sitting up in chair-still with some pain right hand but unchanged  afebrile  Vitals:   06/28/22 2336 06/29/22 0335  BP: 134/63 (!) 148/57  Pulse: 67 66  Resp:    Temp: 98.1 F (36.7 C) 98.5 F (36.9 C)  SpO2: 93% 93%    Physical Exam: General:  no distress Cardiac:  regular Lungs:  non labored Extremities:  brisk right radial doppler signal (stronger today) and monophasic right ulnar doppler signal   CBC    Component Value Date/Time   WBC 6.5 06/28/2022 0130   RBC 2.65 (L) 06/28/2022 0130   HGB 8.5 (L) 06/29/2022 0123   HCT 27.3 (L) 06/29/2022 0123   PLT 162 06/28/2022 0130   MCV 97.0 06/28/2022 0130   MCH 30.2 06/28/2022 0130   MCHC 31.1 06/28/2022 0130   RDW 15.5 06/28/2022 0130   LYMPHSABS 1.0 06/27/2022 0805   MONOABS 0.5 06/27/2022 0805   EOSABS 0.3 06/27/2022 0805   BASOSABS 0.0 06/27/2022 0805    BMET    Component Value Date/Time   NA 133 (L) 06/29/2022 0123   K 3.4 (L) 06/29/2022 0123   CL 96 (L) 06/29/2022 0123   CO2 29 06/29/2022 0123   GLUCOSE 110 (H) 06/29/2022 0123   BUN 11 06/29/2022 0123   CREATININE 2.40 (H) 06/29/2022 0123   CALCIUM 8.7 (L) 06/29/2022 0123   GFRNONAA 28 (L) 06/29/2022 0123    INR    Component Value Date/Time   INR 1.3 (H) 06/23/2022 1755     Intake/Output Summary (Last 24 hours) at 06/29/2022 0641 Last data filed at 06/28/2022 2200 Gross per 24 hour  Intake 240 ml  Output 1000 ml  Net -760 ml      Assessment/Plan:  72 y.o. male is s/p:  rch aortogram via right CFA with right radial artery angioplasty on 06/25/2022 by Dr. Chestine Spore   4 Days Post-Op   -pt continues to have brisk right radial doppler signal-sounds stronger today from a couple of days ago.  -for PD cath removal tomorrow.  Npo after MN/consent/labs have been ordered -DVT prophylaxis:  pt on sq heparin-ordered for 3 doses and to hold after 6am dose tomorrow (6/24) and ordered to  restart at 2200 6/24 -continue asa/statin/plavix -pt's HD schedule is T/T/S   Doreatha Massed, PA-C [facility] [phone] 06/29/2022 6:41 AM  I have interviewed the patient and examined the patient. I agree with the findings by the PA.  For removal of peritoneal dialysis catheter tomorrow.  All questions answered.  He is agreeable to proceed.  Cari Caraway, MD

## 2022-06-29 NOTE — Progress Notes (Signed)
Mobility Specialist: Progress Note   06/29/22 1546  Mobility  Activity Transferred from chair to bed  Level of Assistance Minimal assist, patient does 75% or more  Assistive Device Front wheel walker  Distance Ambulated (ft) 2 ft  Activity Response Tolerated well  Mobility Referral Yes  $Mobility charge 1 Mobility  Mobility Specialist Start Time (ACUTE ONLY) 1537  Mobility Specialist Stop Time (ACUTE ONLY) 1546  Mobility Specialist Time Calculation (min) (ACUTE ONLY) 9 min   Received pt in chair having no complaints and agreeable to mobility. Pt assisted to the bed per request. Left in bed w/ call bell in reach and all needs met.  Merrell Rettinger Mobility Specialist Please contact via SecureChat or Rehab office at [phone]

## 2022-06-29 NOTE — Progress Notes (Signed)
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  He was admitted to [facility] from 5 /1 until 05/13/2022 due to uremia from that time he was transitioned from PD to HD. He is just from the hospital stay to nursing home regimen overall doing well per patient and his wife.  -About 3 weeks ago he started having pain in his right hand  and progressively gotten worse and he was sent to the ED on 06/04/2022 for complaints of right fingertip pain and at that time he had good capillary refill images reviewed from that day and he had an arterial Doppler on that day without significant stenosis or impaired blood flow. He discharged back to the nursing facility but since then the pain has continued and worsened and he developed dark discoloration of the right ring finger on his right hand and ulcer. Upon evaluation of the ED he is moderately hypertensive and lab work was done. Vascular surgery and hand surgery was consulted and vascular surgery evaluated and are planning a angiogram with a transfemoral approach with a focus in the right upper extremity to evaluate his inflow.    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 -Patient s/p arch aortogram with right upper extremity arteriogram and right radial artery angioplasty on 06/25/2022 per Dr. Chestine Spore.   -Per vascular surgery patient optimized and recommending continuation of aspirin and statin and starting Plavix when okay from hand surgery point of view.   -Discussed with PA, Earney Hamburg, covering for orthopedics/hand surgery who recommended resumption of Plavix with outpatient follow-up with Dr. Yehuda Budd, hand surgeon in the next 1 to 2 weeks for further management.   -Continue Plavix 75 mg daily.   -Vascular surgery following. -Supportive care, pain management.  2.  ESRD on HD TTS -Patient seen in consultation by nephrology, underwent hemodialysis on 06/25/2022, will undergo hemodialysis, today 06/27/2022 per nephrology and then back to regular schedule today..   -Per nephrology PD catheter to be removed tomorrow, Monday by vascular surgery. -Per nephrology.  3.  Chronic back and neck pain -Continue home regimen buprenorphine 2 mg SL at bedtime, oxycodone 5 mg p.o. every 4 hours as needed moderate pain and IV fentanyl 50 mcg every hour as needed moderate pain.  4.  Hypertension -Currently stable. -Demadex on hold.  5.  Hypothyroidism -Synthroid.  6.  Depression/anxiety -BuSpar, Lexapro.    7.  Anemia of chronic kidney disease -Hemoglobin currently at 8.5 from 8.0 from 7.8 from 7.0 from 8.5 on admission.  -Status post transfusion 1 unit PRBCs 06/26/2022 -Anemia panel with iron level of 19,  TIBC of 164, ferritin of 793, folate of 11.8. -Ferrlecit iron load ordered per nephrology, aranesp in HD. -Per nephrology.  8.  GERD -PPI.   9.  Obesity -BMI 33.52 kg/m. -Lifestyle modification -Outpatient follow-up with PCP.  10.  Hyponatremia -On HD. -Stable.  11.  Hypomagnesemia -Magnesium of 1.4. -Will defer replacement  to nephrology.   DVT prophylaxis: Heparin Code Status: Full Family Communication: Updated patient.  No family at bedside. Disposition: TBD  Status is: Inpatient Remains inpatient appropriate because: Severity of illness   Consultants:  Hand surgeon/orthopedics: Dr. Yehuda Budd 06/24/2022 Vascular surgery: Dr. Chestine Spore 06/23/2022 Nephrology: Dr. Signe Colt 06/24/2022  Procedures:  Transfuse 1 unit PRBCs 06/26/2022  Antimicrobials:  Anti-infectives (From admission, onward)    Start     Dose/Rate Route Frequency Ordered Stop   06/30/22 0800  ceFAZolin (ANCEF) IVPB 1 g/50 mL premix       Note to Pharmacy: Send with pt to OR   1 g 100 mL/hr over 30 Minutes Intravenous On call 06/29/22 0717 07/01/22 0800         Subjective: Sitting up in chair.  States he feels well.  States hemodialysis went well yesterday.  Denies any chest pain.  No shortness of breath.  No abdominal pain.  Still with pain in his right fingers.  Tolerating current diet.   Objective: Vitals:   06/28/22 1900 06/28/22 2336 06/29/22 0335 06/29/22 0743  BP: 136/60 134/63 (!) 148/57 (!) 148/63  Pulse: 68 67 66 70  Resp: 16   13  Temp: 98.4 F (36.9 C) 98.1 F (36.7 C) 98.5 F (36.9 C) 98.4 F (36.9 C)  TempSrc: Oral Oral Oral Oral  SpO2: 94% 93% 93% 95%  Weight:      Height:        Intake/Output Summary (Last 24 hours) at 06/29/2022 1023 Last data filed at 06/28/2022 2200 Gross per 24 hour  Intake 240 ml  Output 1000 ml  Net -760 ml    Filed Weights   06/27/22 0815 06/27/22 1254 06/28/22 1811  Weight: 92.1 kg 90.1 kg 88.5 kg    Examination:  General exam: NAD Respiratory system: CTAB.  No wheezes, no crackles, no rhonchi.  Normal respiratory effort.  Speaking full sentences.   Cardiovascular system: Regular rate rhythm no murmurs rubs or gallops.  No JVD.  No lower extremity edema.  Gastrointestinal system: Abdomen is soft, nontender, nondistended, positive bowel sounds.  No rebound.  No guarding.  Central  nervous system: Alert and oriented. No focal neurological deficits. Extremities: Right ring finger with dry gangrene with some streaking erythema up the hand.  Ring finger and index finger less tender to palpation.  Right index finger with some necrotic tissue on the tip..  Symmetric 5 x 5 power. Skin: No rashes, lesions or ulcers Psychiatry: Judgement and insight appear normal. Mood & affect appropriate.     Data Reviewed: I have personally reviewed following labs and imaging studies  CBC: Recent Labs  Lab 06/24/22 0122 06/25/22 0259 06/26/22 0127 06/26/22 1913 06/27/22 0805 06/28/22 0130 06/29/22 0123  WBC 8.5 7.0 5.7  --  7.1 6.5  --   NEUTROABS  --  5.1 3.8  --  5.2  --   --   HGB 8.0* 7.3* 7.0* 8.8* 7.8* 8.0* 8.5*  HCT 25.7* 23.9* 23.1* 28.4* 25.7* 25.7* 27.3*  MCV 101.2* 99.2 100.0  --  97.7 97.0  --   PLT 193 188 163  --  180 162  --  Basic Metabolic Panel: Recent Labs  Lab 06/25/22 0259 06/26/22 0127 06/27/22 0805 06/28/22 0130 06/29/22 0123  NA 133* 137 134* 133* 133*  K 5.2* 4.2 4.3 3.4* 3.4*  CL 98 97* 96* 95* 96*  CO2 22 27 24 27 29   GLUCOSE 88 84 106* 91 110*  BUN 66* 32* 46* 20 11  CREATININE 6.44* 4.13* 5.63* 3.41* 2.40*  CALCIUM 8.3* 8.3* 8.5* 8.7* 8.7*  MG 1.4* 1.4* 1.6* 1.4*  --   PHOS 5.6* 4.4 6.0* 3.5 2.5     GFR: Estimated Creatinine Clearance: 29.9 mL/min (A) (by C-G formula based on SCr of 2.4 mg/dL (H)).  Liver Function Tests: Recent Labs  Lab 06/23/22 1755 06/25/22 0259 06/26/22 0127 06/27/22 0805 06/28/22 0130 06/29/22 0123  AST 14* 19  --   --   --   --   ALT 12 11  --   --   --   --   ALKPHOS 138* 113  --   --   --   --   BILITOT 0.6 0.3  --   --   --   --   PROT 6.1* 5.8*  --   --   --   --   ALBUMIN 2.5* 2.3* 2.1* 2.3* 2.3* 2.4*     CBG: No results for input(s): "GLUCAP" in the last 168 hours.   Recent Results (from the past 240 hour(s))  Surgical PCR screen     Status: None   Collection Time: 06/23/22  9:44  PM   Specimen: Nasal Mucosa; Nasal Swab  Result Value Ref Range Status   MRSA, PCR NEGATIVE NEGATIVE Final   Staphylococcus aureus NEGATIVE NEGATIVE Final    Comment: (NOTE) The Xpert SA Assay (FDA approved for NASAL specimens in patients 39 years of age and older), is one component of a comprehensive surveillance program. It is not intended to diagnose infection nor to guide or monitor treatment. Performed at [facility], [address], [place], [place] [address]   Urine Culture (for pregnant, neutropenic or urologic patients or patients with an indwelling urinary catheter)     Status: None   Collection Time: 06/27/22  8:07 AM   Specimen: Urine, Clean Catch  Result Value Ref Range Status   Specimen Description URINE, CLEAN CATCH  Final   Special Requests NONE  Final   Culture   Final    NO GROWTH Performed at [facility], [address], [place], [place] [address]    Report Status 06/28/2022 FINAL  Final         Radiology Studies: No results found.      Scheduled Meds:  aspirin EC  81 mg Oral Daily   buprenorphine  2 mg Sublingual QHS   busPIRone  10 mg Oral BID   calcitRIOL  0.25 mcg Oral Daily   calcium acetate  667 mg Oral TID WC   Chlorhexidine Gluconate Cloth  6 each Topical Q0600   clopidogrel  75 mg Oral Daily   darbepoetin (ARANESP) injection - DIALYSIS  100 mcg Subcutaneous Q Thu-1800   escitalopram  20 mg Oral Daily   heparin  5,000 Units Subcutaneous Q8H   [START ON 06/30/2022] heparin injection (subcutaneous)  5,000 Units Subcutaneous Q8H   heparin sodium (porcine)  3,000 Units Intravenous Once   levothyroxine  250 mcg Oral QAC breakfast   melatonin  5 mg Oral QHS   pantoprazole  40 mg Oral Daily   rosuvastatin  10 mg Oral  QHS   sodium chloride flush  3 mL Intravenous Q12H   Continuous Infusions:  sodium chloride     [START ON 06/30/2022]  ceFAZolin (ANCEF) IV     ferric gluconate (FERRLECIT) IVPB 250 mg (06/28/22 2310)     LOS:  6 days    Time spent: 35 minutes    Ramiro Harvest, MD [facility]   To contact the attending provider between 7A-7P or the covering provider during after hours 7P-7A, please log into the web site [email or web address] and access using universal [place] password for that web site. If you do not have the password, please call the hospital operator.  06/29/2022, 10:23 AM

## 2022-06-30 ENCOUNTER — Other Ambulatory Visit: Payer: Self-pay

## 2022-06-30 ENCOUNTER — Inpatient Hospital Stay (HOSPITAL_COMMUNITY): Payer: Medicare Other | Admitting: Anesthesiology

## 2022-06-30 ENCOUNTER — Encounter (HOSPITAL_COMMUNITY)
Admission: EM | Disposition: A | Discharge status: Skilled Nursing Facility | Payer: Self-pay | Source: Skilled Nursing Facility | Attending: Internal Medicine

## 2022-06-30 ENCOUNTER — Encounter (HOSPITAL_COMMUNITY): Payer: Self-pay | Admitting: Internal Medicine

## 2022-06-30 DIAGNOSIS — D631 Anemia in chronic kidney disease: Secondary | ICD-10-CM

## 2022-06-30 DIAGNOSIS — I120 Hypertensive chronic kidney disease with stage 5 chronic kidney disease or end stage renal disease: Secondary | ICD-10-CM

## 2022-06-30 DIAGNOSIS — I96 Gangrene, not elsewhere classified: Secondary | ICD-10-CM | POA: Diagnosis not present

## 2022-06-30 DIAGNOSIS — N186 End stage renal disease: Secondary | ICD-10-CM

## 2022-06-30 DIAGNOSIS — I2510 Atherosclerotic heart disease of native coronary artery without angina pectoris: Secondary | ICD-10-CM

## 2022-06-30 DIAGNOSIS — I998 Other disorder of circulatory system: Secondary | ICD-10-CM

## 2022-06-30 DIAGNOSIS — M79641 Pain in right hand: Secondary | ICD-10-CM

## 2022-06-30 DIAGNOSIS — E1122 Type 2 diabetes mellitus with diabetic chronic kidney disease: Secondary | ICD-10-CM

## 2022-06-30 DIAGNOSIS — Z6833 Body mass index (BMI) 33.0-33.9, adult: Secondary | ICD-10-CM

## 2022-06-30 DIAGNOSIS — Z992 Dependence on renal dialysis: Secondary | ICD-10-CM

## 2022-06-30 HISTORY — PX: CAPD REMOVAL: SHX5234

## 2022-06-30 LAB — CBC
HCT: 28.7 % — ABNORMAL LOW (ref 39.0–52.0)
Hemoglobin: 8.8 g/dL — ABNORMAL LOW (ref 13.0–17.0)
MCH: 30.2 pg (ref 26.0–34.0)
MCHC: 30.7 g/dL (ref 30.0–36.0)
MCV: 98.6 fL (ref 80.0–100.0)
Platelets: 183 10*3/uL (ref 150–400)
RBC: 2.91 MIL/uL — ABNORMAL LOW (ref 4.22–5.81)
RDW: 15.4 % (ref 11.5–15.5)
WBC: 7.6 10*3/uL (ref 4.0–10.5)
nRBC: 0 % (ref 0.0–0.2)

## 2022-06-30 LAB — BASIC METABOLIC PANEL
Anion gap: 10 (ref 5–15)
BUN: 20 mg/dL (ref 8–23)
CO2: 27 mmol/L (ref 22–32)
Calcium: 9.1 mg/dL (ref 8.9–10.3)
Chloride: 95 mmol/L — ABNORMAL LOW (ref 98–111)
Creatinine, Ser: 3.77 mg/dL — ABNORMAL HIGH (ref 0.61–1.24)
GFR, Estimated: 16 mL/min — ABNORMAL LOW (ref >60)
Glucose, Bld: 97 mg/dL (ref 70–99)
Potassium: 4.1 mmol/L (ref 3.5–5.1)
Sodium: 132 mmol/L — ABNORMAL LOW (ref 135–145)

## 2022-06-30 LAB — BPAM RBC
Blood Product Expiration Date: 202407192359
ISSUE DATE / TIME: 202406171324
ISSUE DATE / TIME: 202406201332
Unit Type and Rh: 5100

## 2022-06-30 LAB — GLUCOSE, CAPILLARY
Glucose-Capillary: 129 mg/dL — ABNORMAL HIGH (ref 70–99)
Glucose-Capillary: 67 mg/dL — ABNORMAL LOW (ref 70–99)
Glucose-Capillary: 89 mg/dL (ref 70–99)
Glucose-Capillary: 90 mg/dL (ref 70–99)

## 2022-06-30 LAB — MAGNESIUM: Magnesium: 1.4 mg/dL — ABNORMAL LOW (ref 1.7–2.4)

## 2022-06-30 LAB — TYPE AND SCREEN

## 2022-06-30 SURGERY — LAPAROSCOPIC REMOVAL CONTINUOUS AMBULATORY PERITONEAL DIALYSIS  (CAPD) CATHETER
Anesthesia: General | Site: Abdomen

## 2022-06-30 MED ORDER — PROMETHAZINE HCL 25 MG/ML IJ SOLN: 6.2500 mg | INTRAMUSCULAR | Status: DC | PRN

## 2022-06-30 MED ORDER — TORSEMIDE 20 MG PO TABS
20.0000 mg | ORAL_TABLET | Freq: Every day | ORAL | Status: DC
  Administered 2022-06-30 – 2022-07-02 (×3): 20 mg via ORAL
  Filled 2022-06-30 (×3): qty 1

## 2022-06-30 MED ORDER — MIDAZOLAM HCL 2 MG/2ML IJ SOLN: 0.5000 mg | Freq: Once | INTRAMUSCULAR | Status: DC | PRN

## 2022-06-30 MED ORDER — OXYCODONE HCL 5 MG/5ML PO SOLN: 5.0000 mg | Freq: Once | ORAL | Status: DC | PRN

## 2022-06-30 MED ORDER — DEXTROSE 50 % IV SOLN
12.5000 g | INTRAVENOUS | Status: AC
  Administered 2022-06-30: 12.5 g via INTRAVENOUS
  Filled 2022-06-30: qty 50

## 2022-06-30 MED ORDER — PHENYLEPHRINE HCL-NACL 20-0.9 MG/250ML-% IV SOLN
INTRAVENOUS | Status: DC | PRN
  Administered 2022-06-30: 40 ug/min via INTRAVENOUS

## 2022-06-30 MED ORDER — CHLORHEXIDINE GLUCONATE 0.12 % MT SOLN
OROMUCOSAL | Status: AC
  Administered 2022-06-30: 15 mL
  Filled 2022-06-30: qty 15

## 2022-06-30 MED ORDER — SODIUM CHLORIDE 0.9 % IR SOLN
Status: DC | PRN
  Administered 2022-06-30: 1000 mL

## 2022-06-30 MED ORDER — SODIUM CHLORIDE 0.9 % IV SOLN: INTRAVENOUS | Status: DC

## 2022-06-30 MED ORDER — PHENYLEPHRINE 80 MCG/ML (10ML) SYRINGE FOR IV PUSH (FOR BLOOD PRESSURE SUPPORT)
PREFILLED_SYRINGE | INTRAVENOUS | Status: DC | PRN
  Administered 2022-06-30 (×3): 160 ug via INTRAVENOUS

## 2022-06-30 MED ORDER — SUGAMMADEX SODIUM 200 MG/2ML IV SOLN
INTRAVENOUS | Status: DC | PRN
  Administered 2022-06-30: 100 mg via INTRAVENOUS

## 2022-06-30 MED ORDER — SODIUM CHLORIDE 0.9 % IV SOLN: INTRAVENOUS | Status: DC | PRN

## 2022-06-30 MED ORDER — ORAL CARE MOUTH RINSE: 15.0000 mL | Freq: Once | OROMUCOSAL | Status: DC

## 2022-06-30 MED ORDER — CHLORHEXIDINE GLUCONATE 0.12 % MT SOLN: 15.0000 mL | Freq: Once | OROMUCOSAL | Status: DC

## 2022-06-30 MED ORDER — PROPOFOL 10 MG/ML IV BOLUS
INTRAVENOUS | Status: AC
  Filled 2022-06-30: qty 20

## 2022-06-30 MED ORDER — EPHEDRINE SULFATE-NACL 50-0.9 MG/10ML-% IV SOSY
PREFILLED_SYRINGE | INTRAVENOUS | Status: DC | PRN
  Administered 2022-06-30: 5 mg via INTRAVENOUS
  Administered 2022-06-30: 10 mg via INTRAVENOUS

## 2022-06-30 MED ORDER — LIDOCAINE 2% (20 MG/ML) 5 ML SYRINGE
INTRAMUSCULAR | Status: AC
  Filled 2022-06-30: qty 5

## 2022-06-30 MED ORDER — DEXAMETHASONE SODIUM PHOSPHATE 10 MG/ML IJ SOLN
INTRAMUSCULAR | Status: DC | PRN
  Administered 2022-06-30: 5 mg via INTRAVENOUS

## 2022-06-30 MED ORDER — OXYCODONE HCL 5 MG PO TABS: 5.0000 mg | ORAL_TABLET | Freq: Once | ORAL | Status: DC | PRN

## 2022-06-30 MED ORDER — ONDANSETRON HCL 4 MG/2ML IJ SOLN
INTRAMUSCULAR | Status: DC | PRN
  Administered 2022-06-30: 4 mg via INTRAVENOUS

## 2022-06-30 MED ORDER — DEXAMETHASONE SODIUM PHOSPHATE 10 MG/ML IJ SOLN
INTRAMUSCULAR | Status: AC
  Filled 2022-06-30: qty 1

## 2022-06-30 MED ORDER — LIDOCAINE 2% (20 MG/ML) 5 ML SYRINGE
INTRAMUSCULAR | Status: DC | PRN
  Administered 2022-06-30: 60 mg via INTRAVENOUS

## 2022-06-30 MED ORDER — FENTANYL CITRATE (PF) 250 MCG/5ML IJ SOLN
INTRAMUSCULAR | Status: DC | PRN
  Administered 2022-06-30: 50 ug via INTRAVENOUS

## 2022-06-30 MED ORDER — DEXTROSE 50 % IV SOLN
INTRAVENOUS | Status: AC
  Filled 2022-06-30: qty 50

## 2022-06-30 MED ORDER — FENTANYL CITRATE (PF) 250 MCG/5ML IJ SOLN
INTRAMUSCULAR | Status: AC
  Filled 2022-06-30: qty 5

## 2022-06-30 MED ORDER — EPHEDRINE 5 MG/ML INJ
INTRAVENOUS | Status: AC
  Filled 2022-06-30: qty 5

## 2022-06-30 MED ORDER — ROCURONIUM BROMIDE 10 MG/ML (PF) SYRINGE
PREFILLED_SYRINGE | INTRAVENOUS | Status: DC | PRN
  Administered 2022-06-30: 50 mg via INTRAVENOUS

## 2022-06-30 MED ORDER — ONDANSETRON HCL 4 MG/2ML IJ SOLN
INTRAMUSCULAR | Status: AC
  Filled 2022-06-30: qty 2

## 2022-06-30 MED ORDER — PHENYLEPHRINE 80 MCG/ML (10ML) SYRINGE FOR IV PUSH (FOR BLOOD PRESSURE SUPPORT)
PREFILLED_SYRINGE | INTRAVENOUS | Status: AC
  Filled 2022-06-30: qty 10

## 2022-06-30 MED ORDER — ROCURONIUM BROMIDE 10 MG/ML (PF) SYRINGE
PREFILLED_SYRINGE | INTRAVENOUS | Status: AC
  Filled 2022-06-30: qty 10

## 2022-06-30 MED ORDER — PROPOFOL 10 MG/ML IV BOLUS
INTRAVENOUS | Status: DC | PRN
  Administered 2022-06-30: 100 mg via INTRAVENOUS

## 2022-06-30 MED ORDER — LACTATED RINGERS IV SOLN: INTRAVENOUS | Status: DC

## 2022-06-30 MED ORDER — FENTANYL CITRATE (PF) 100 MCG/2ML IJ SOLN: 25.0000 ug | INTRAMUSCULAR | Status: DC | PRN

## 2022-06-30 SURGICAL SUPPLY — 39 items
ADH SKN CLS APL DERMABOND .7 (GAUZE/BANDAGES/DRESSINGS) ×1
ADH SKN CLS LQ APL DERMABOND (GAUZE/BANDAGES/DRESSINGS) ×1
ADH SKNCLS APL OCTYL .7 VIOL (GAUZE/BANDAGES/DRESSINGS) ×1
ADH SKNCLS LQ APL MCBL BRR (GAUZE/BANDAGES/DRESSINGS) ×1
BAG COUNTER SPONGE SURGICOUNT (BAG) ×1 IMPLANT
BAG SPNG CNTER NS LX DISP (BAG) ×1
BLADE CLIPPER SURG (BLADE) IMPLANT
CANISTER SUCT 3000ML PPV (MISCELLANEOUS) IMPLANT
COVER SURGICAL LIGHT HANDLE (MISCELLANEOUS) ×1 IMPLANT
DERMABOND ADVANCED .7 DNX12 (GAUZE/BANDAGES/DRESSINGS) ×1 IMPLANT
DERMABOND ADVANCED .7 DNX6 (GAUZE/BANDAGES/DRESSINGS) IMPLANT
ELECT REM PT RETURN 9FT ADLT (ELECTROSURGICAL) ×1
ELECTRODE REM PT RTRN 9FT ADLT (ELECTROSURGICAL) ×1 IMPLANT
GAUZE SPONGE 2X2 8PLY STRL LF (GAUZE/BANDAGES/DRESSINGS) IMPLANT
GLOVE BIOGEL PI IND STRL 7.5 (GLOVE) ×1 IMPLANT
GOWN STRL REUS W/ TWL LRG LVL3 (GOWN DISPOSABLE) ×2 IMPLANT
GOWN STRL REUS W/ TWL XL LVL3 (GOWN DISPOSABLE) ×1 IMPLANT
GOWN STRL REUS W/TWL LRG LVL3 (GOWN DISPOSABLE) ×2
GOWN STRL REUS W/TWL XL LVL3 (GOWN DISPOSABLE) ×1
IRRIG SUCT STRYKERFLOW 2 WTIP (MISCELLANEOUS)
IRRIGATION SUCT STRKRFLW 2 WTP (MISCELLANEOUS) IMPLANT
KIT BASIN OR (CUSTOM PROCEDURE TRAY) ×1 IMPLANT
KIT TURNOVER KIT B (KITS) ×1 IMPLANT
NS IRRIG 1000ML POUR BTL (IV SOLUTION) ×1 IMPLANT
PACK GENERAL/GYN (CUSTOM PROCEDURE TRAY) ×1 IMPLANT
PAD ARMBOARD 7.5X6 YLW CONV (MISCELLANEOUS) ×1 IMPLANT
SET TUBE SMOKE EVAC HIGH FLOW (TUBING) ×1 IMPLANT
SLEEVE Z-THREAD 5X100MM (TROCAR) IMPLANT
SPIKE FLUID TRANSFER (MISCELLANEOUS) ×1 IMPLANT
SUT ETHIBOND X763 2 0 SH 1 (SUTURE) IMPLANT
SUT VIC AB 3-0 SH 27 (SUTURE) ×2
SUT VIC AB 3-0 SH 27X BRD (SUTURE) ×2 IMPLANT
SUT VICRYL 0 UR6 27IN ABS (SUTURE) ×2 IMPLANT
SUT VICRYL 4-0 PS2 18IN ABS (SUTURE) ×2 IMPLANT
TOWEL GREEN STERILE (TOWEL DISPOSABLE) ×1 IMPLANT
TOWEL GREEN STERILE FF (TOWEL DISPOSABLE) ×1 IMPLANT
TRAY LAPAROSCOPIC MC (CUSTOM PROCEDURE TRAY) ×1 IMPLANT
TROCAR Z THREAD OPTICAL 12X100 (TROCAR) ×1 IMPLANT
WATER STERILE IRR 1000ML POUR (IV SOLUTION) ×1 IMPLANT

## 2022-06-30 NOTE — Anesthesia Preprocedure Evaluation (Signed)
Anesthesia Evaluation  Patient identified by MRN, date of birth, ID band Patient awake    Reviewed: Allergy & Precautions, NPO status , Patient's Chart, lab work & pertinent test results  Airway Mallampati: III  TM Distance: >3 FB     Dental  (+) Teeth Intact, Dental Advisory Given   Pulmonary    breath sounds clear to auscultation       Cardiovascular hypertension, + CAD   Rhythm:Regular Rate:Normal     Neuro/Psych  PSYCHIATRIC DISORDERS  Depression       GI/Hepatic ,GERD  ,,  Endo/Other  diabetesHypothyroidism    Renal/GU Dialysis and ESRFRenal disease     Musculoskeletal   Abdominal   Peds  Hematology   Anesthesia Other Findings   Reproductive/Obstetrics                             Anesthesia Physical Anesthesia Plan  ASA: 3  Anesthesia Plan: General   Post-op Pain Management: Tylenol PO (pre-op)*   Induction: Intravenous  PONV Risk Score and Plan: 3 and Ondansetron, Dexamethasone and Midazolam  Airway Management Planned: Oral ETT  Additional Equipment: None  Intra-op Plan:   Post-operative Plan: Extubation in OR  Informed Consent: I have reviewed the patients History and Physical, chart, labs and discussed the procedure including the risks, benefits and alternatives for the proposed anesthesia with the patient or authorized representative who has indicated his/her understanding and acceptance.     Dental advisory given  Plan Discussed with: CRNA  Anesthesia Plan Comments:        Anesthesia Quick Evaluation

## 2022-06-30 NOTE — Anesthesia Postprocedure Evaluation (Signed)
Anesthesia Post Note  Patient: Chase [name]  Procedure(s) Performed: REMOVAL CONTINUOUS AMBULATORY PERITONEAL DIALYSIS  (CAPD) CATHETER (Abdomen)     Patient location during evaluation: PACU Anesthesia Type: General Level of consciousness: awake and alert, patient cooperative and oriented Pain management: pain level controlled Vital Signs Assessment: post-procedure vital signs reviewed and stable Respiratory status: spontaneous breathing, nonlabored ventilation, respiratory function stable and patient connected to nasal cannula oxygen Cardiovascular status: blood pressure returned to baseline and stable Postop Assessment: no apparent nausea or vomiting Anesthetic complications: no   No notable events documented.  Last Vitals:  Vitals:   06/30/22 1920 06/30/22 1930  BP:  (!) 122/96  Pulse: 70 67  Resp: 17 17  Temp:    SpO2: 90% 100%    Last Pain:  Vitals:   06/30/22 1930  TempSrc:   PainSc: 0-No pain                 Sanel Stemmer,E. Amil Moseman

## 2022-06-30 NOTE — Progress Notes (Signed)
[facility] Progress Note   Assessment/ Plan:   Assessment/Plan: 1 R finger/ hand gangrene: [facility] followings/p angio with PTA to R radial artery. Hand following, demarcating 2 ESRD: TTS normally.  HD Wednesday, Friday, and then on schedule Sat.  Next HD Tuesday 6/25 3 Hypertension/ volume: UF as able, appears grossly euvolemic 4. 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 Metabolic Bone Disease: binders/ vitamins when eating 6.  PD catheter- to be removed on Monday by VVS - greatly appreciate. 7.  Dispo: pending  Subjective:    Seen in room. Sleeping in recliner, wakes easily. No complaints. To OR for PD cath removal    Objective:   BP 110/69 (BP Location: Right Leg)   Pulse 65   Temp 98.1 F (36.7 C) (Oral)   Resp 18   Ht 5\' 6"  (1.676 m)   Wt 88.5 kg   SpO2 94%   BMI 31.49 kg/m   Physical Exam: GEN NAD, lying in bed PULM clear CV RRR ABD soft EXT no LE edema, R hand fingers demarcating NEURO slow to respond ACCESS: L RC AVF + T/B, PD catheter inplace  Labs: BMET Recent Labs  Lab 06/24/22 0122 06/25/22 0259 06/26/22 0127 06/27/22 0805 06/28/22 0130 06/29/22 0123 06/30/22 0115  NA 135 133* 137 134* 133* 133* 132*  K 5.0 5.2* 4.2 4.3 3.4* 3.4* 4.1  CL 97* 98 97* 96* 95* 96* 95*  CO2 25 22 27 24 27 29 27   GLUCOSE 92 88 84 106* 91 110* 97  BUN 59* 66* 32* 46* 20 11 20   CREATININE 5.27* 6.44* 4.13* 5.63* 3.41* 2.40* 3.77*  CALCIUM 8.9 8.3* 8.3* 8.5* 8.7* 8.7* 9.1  PHOS  --  5.6* 4.4 6.0* 3.5 2.5  --     CBC Recent Labs  Lab 06/25/22 0259 06/26/22 0127 06/26/22 1913 06/27/22 0805 06/28/22 0130 06/29/22 0123 06/30/22 0115  WBC 7.0 5.7  --  7.1 6.5  --  7.6  NEUTROABS 5.1 3.8  --  5.2  --   --   --   HGB 7.3* 7.0*   < > 7.8* 8.0* 8.5* 8.8*  HCT 23.9* 23.1*   < > 25.7* 25.7* 27.3* 28.7*  MCV 99.2 100.0  --  97.7 97.0  --  98.6  PLT 188 163  --  180 162  --  183   < > = values in this interval not  displayed.       Medications:     aspirin EC  81 mg Oral Daily   buprenorphine  2 mg Sublingual QHS   busPIRone  10 mg Oral BID   calcitRIOL  0.25 mcg Oral Daily   calcium acetate  667 mg Oral TID WC   Chlorhexidine Gluconate Cloth  6 each Topical Q0600   clopidogrel  75 mg Oral Daily   darbepoetin (ARANESP) injection - DIALYSIS  100 mcg Subcutaneous Q Thu-1800   escitalopram  20 mg Oral Daily   heparin injection (subcutaneous)  5,000 Units Subcutaneous Q8H   levothyroxine  250 mcg Oral QAC breakfast   melatonin  5 mg Oral QHS   pantoprazole  40 mg Oral Daily   rosuvastatin  10 mg Oral QHS   sodium chloride flush  3 mL Intravenous Q12H    Tomasa Blase PA-C [facility] 06/30/2022,9:03 AM

## 2022-06-30 NOTE — Progress Notes (Addendum)
Occupational Therapy Treatment Patient Details Name: Chase [name] MRN: [id number] DOB: October 30, 1950 Today's Date: 06/30/2022   History of present illness 72 y.o. male presenting to [facility] 6/17 with dry gangrene of R hand s/p 6/19 arch aortogram with right upper extremity arteriogram. PMH includes back pain, depression, CAD, DM, CKD, HTN, SDH, ESRD   OT comments  Pt progressing towards goals, limited by RUE/hand pain. Pt able to complete seated RUE therex, taped wash cloth into ball for "squeezing" as pt unable to squeeze ball provided in prior session. Pt able to demo each exercise x5. Educated pt on importance of mobility and elevation for edema control and pt verbalized understanding. RUE propped up on 2 pillows at end of session. Pt presenting with impairments listed below, will follow acutely. Patient will benefit from continued inpatient follow up therapy, <3 hours/day to maximize safety/ind with ADLs/functional mobility prior to d/c home.     Recommendations for follow up therapy are one component of a multi-disciplinary discharge planning process, led by the attending physician.  Recommendations may be updated based on patient status, additional functional criteria and insurance authorization.    Assistance Recommended at Discharge Intermittent Supervision/Assistance  Patient can return home with the following  A lot of help with walking and/or transfers;A lot of help with bathing/dressing/bathroom;Assistance with cooking/housework;Assist for transportation;Help with stairs or ramp for entrance   Equipment Recommendations  None recommended by OT    Recommendations for Other Services      Precautions / Restrictions Precautions Precautions: Fall Precaution Comments: fall hx Restrictions Weight Bearing Restrictions: No       Mobility Bed Mobility               General bed mobility comments: OOB in chair upon arrival and departure    Transfers Overall transfer level: Needs  assistance Equipment used: Rolling walker (2 wheels) Transfers: Sit to/from Stand Sit to Stand: Mod assist, From elevated surface                 Balance Overall balance assessment: Needs assistance Sitting-balance support: Feet supported Sitting balance-Leahy Scale: Fair Sitting balance - Comments: Pt in recliner, sitting balance not fully assessed   Standing balance support: Bilateral upper extremity supported, During functional activity, Reliant on assistive device for balance Standing balance-Leahy Scale: Poor Standing balance comment: reliant on RW support                           ADL either performed or assessed with clinical judgement   ADL Overall ADL's : Needs assistance/impaired     Grooming: Minimal assistance;Sitting                   Toilet Transfer: Moderate assistance;Rolling walker (2 wheels) Toilet Transfer Details (indicate cue type and reason): sit to stand to adjust pillows         Functional mobility during ADLs: Min guard;Rolling walker (2 wheels)      Extremity/Trunk Assessment Upper Extremity Assessment RUE Deficits / Details: Pt reports pain with AROM, shoulder flex ~90 degrees 3/5 MMT, can touch face. R ring finger blackened/necrotic. R fingers limited to to ~40 degrees PIP flexion, able to make parital hook fist.   Lower Extremity Assessment Lower Extremity Assessment: Defer to PT evaluation        Vision   Additional Comments: closes eyes during session   Perception Perception Perception: Not tested   Praxis Praxis Praxis: Not tested    Cognition Arousal/Alertness:  Awake/alert Behavior During Therapy: Flat affect Overall Cognitive Status: Impaired/Different from baseline Area of Impairment: Problem solving                             Problem Solving: Requires verbal cues, Decreased initiation, Difficulty sequencing, Slow processing, Requires tactile cues General Comments: pt with slowed  response to questions requiring cuing for sequencing        Exercises Exercises: General Upper Extremity General Exercises - Upper Extremity Elbow Flexion: AROM, Right, 5 reps, Seated Elbow Extension: AROM, Right, 5 reps, Seated Wrist Flexion: AROM, 5 reps, Right, Seated Wrist Extension: AROM, Right, 5 reps, Seated Digit Composite Flexion: AROM, Right, 5 reps, Seated Composite Extension: AROM, Right, 5 reps, Seated Other Exercises Other Exercises: R pronation/supination x5 Other Exercises: RUE wash cloth squeeze x5 Other Exercises: Finger adduction/abduction x5 reps RUE    Shoulder Instructions       General Comments VSS on RA    Pertinent Vitals/ Pain       Pain Assessment Pain Assessment: Faces Pain Score: 7  Faces Pain Scale: Hurts whole lot Pain Location: R hand and forearm Pain Descriptors / Indicators: Discomfort, Grimacing, Guarding Pain Intervention(s): Limited activity within patient's tolerance, Monitored during session, Repositioned  Home Living                                          Prior Functioning/Environment              Frequency  Min 3X/week        Progress Toward Goals  OT Goals(current goals can now be found in the care plan section)  Progress towards OT goals: Progressing toward goals  Acute Rehab OT Goals Patient Stated Goal: none stated OT Goal Formulation: With patient Time For Goal Achievement: 07/10/22 Potential to Achieve Goals: Good ADL Goals Pt Will Perform Grooming: standing;with min guard assist Pt Will Perform Lower Body Dressing: sitting/lateral leans;with supervision;with set-up Pt Will Transfer to Toilet: with min guard assist;ambulating Pt/caregiver will Perform Home Exercise Program: Increased ROM;Right Upper extremity;With written HEP provided;Independently;Increased strength Additional ADL Goal #2: Pt will achieve 70 degrees PIP flexion in R hand in preparation for functional grasp activites   Plan Discharge plan remains appropriate;Frequency remains appropriate    Co-evaluation                 AM-PAC OT "6 Clicks" Daily Activity     Outcome Measure   Help from another person eating meals?: A Little Help from another person taking care of personal grooming?: A Little Help from another person toileting, which includes using toliet, bedpan, or urinal?: A Little Help from another person bathing (including washing, rinsing, drying)?: A Little Help from another person to put on and taking off regular upper body clothing?: A Little Help from another person to put on and taking off regular lower body clothing?: A Lot 6 Click Score: 17    End of Session Equipment Utilized During Treatment: Rolling walker (2 wheels);Gait belt  OT Visit Diagnosis: Unsteadiness on feet (R26.81);Other abnormalities of gait and mobility (R26.89);History of falling (Z91.81);Muscle weakness (generalized) (M62.81);Pain Pain - Right/Left: Right Pain - part of body: Hand   Activity Tolerance Patient tolerated treatment well   Patient Left in chair;with call bell/phone within reach   Nurse Communication Mobility status  Time: 1610-9604 OT Time Calculation (min): 22 min  Charges: OT General Charges $OT Visit: 1 Visit OT Treatments $Therapeutic Exercise: 8-22 mins  Carver Fila, OTD, OTR/L SecureChat Preferred Acute Rehab [phone]   Carver Fila Koonce 06/30/2022, 11:19 AM

## 2022-06-30 NOTE — Transfer of Care (Signed)
Immediate Anesthesia Transfer of Care Note  Patient: Chase [name]  Procedure(s) Performed: REMOVAL CONTINUOUS AMBULATORY PERITONEAL DIALYSIS  (CAPD) CATHETER (Abdomen)  Patient Location: PACU  Anesthesia Type:General  Level of Consciousness: awake and alert   Airway & Oxygen Therapy: Patient Spontanous Breathing and Patient connected to face mask oxygen  Post-op Assessment: Report given to RN and Post -op Vital signs reviewed and stable  Post vital signs: Reviewed and stable  Last Vitals:  Vitals Value Taken Time  BP 111/56 06/30/22 1909  Temp    Pulse 66 06/30/22 1910  Resp 21 06/30/22 1910  SpO2 100 % 06/30/22 1910  Vitals shown include unvalidated device data.  Last Pain:  Vitals:   06/30/22 1621  TempSrc: Oral  PainSc: 0-No pain      Patients Stated Pain Goal: 0 (06/30/22 0513)  Complications: No notable events documented.

## 2022-06-30 NOTE — Op Note (Signed)
    NAME: Chase [name]    MRN: [id number] DOB: 01-Jul-1950    DATE OF OPERATION: 06/30/2022  PREOP DIAGNOSIS:    End stage renal disease  POSTOP DIAGNOSIS:    Same  PROCEDURE:    Peritoneal dialysis catheter removal    SURGEON: Victorino Sparrow  ASSIST: Nathanial Rancher, PA  ANESTHESIA: General   EBL: 5ml  INDICATIONS:    Chase [name] is a 72 y.o. male with end stage renal diease, prior history of midline laparotomy and peritoneal dialysis cathter placement at OSH. Pt currently being dialyzed through a tunneled HD line and needs the peritoneal cathter removed to prevent risk for future infection.   FINDINGS:    Abnormal placement of prior peritoneal dialysis cathter placed in the fascia at both of the cuff sites.   TECHNIQUE:   Patient brought to the OR laid in supine position.  General anesthesia was induced and patient was prepped draped in standard fashion.  The case began with ultrasound insonation of the previously placed peritoneal dialysis catheter.  Its course was marked.  Interestingly, only one of the cuffs was seen in the adipose tissue.  2 incisions were made, 1 at the proximal calf, and the other at the area which had entered the fascia.  These longitudinal incisions were taken down to the peritoneal dialysis catheter.  The cuff was freed using cautery, however interestingly, the cuff had either eroded into the fascia, or it was intentionally placed there.  Next, I moved distally to where the peritoneal dialysis catheter entered the fascia.  The mother cough was in the peritoneum.  This was freed, and removed.  Special care was taken to ensure there was no damage of adjacent structures such as bowel.  The omentum was appreciated, however bowel was not seen.  Once the catheter was completely removed I moved to repair the 2 areas of fascia that were disrupted.  Ethibond suture was brought onto the field and the fascial defect was closed in running fashion.  This was  followed by 3-0 Vicryl with Monocryl and Dermabond at the level of the skin.  Ladonna Snide, MD [facility] DATE OF DICTATION:   06/30/2022

## 2022-06-30 NOTE — Progress Notes (Addendum)
  Progress Note    06/30/2022 5:47 AM 5 Days Post-Op  Subjective:  sitting in chair.  No distress  afebrile  Vitals:   06/30/22 0000 06/30/22 0449  BP: (!) 137/90 (!) 147/79  Pulse: 65 (!) 131  Resp: 16 15  Temp: 98.1 F (36.7 C) 98 F (36.7 C)  SpO2: 100% (!) 84%    Physical Exam: General:  no distress Cardiac:  regular Lungs:  non labored Extremities:  brisk right radial doppler signal.  Gangrenous fingertip continuing to demarcate.    CBC    Component Value Date/Time   WBC 7.6 06/30/2022 0115   RBC 2.91 (L) 06/30/2022 0115   HGB 8.8 (L) 06/30/2022 0115   HCT 28.7 (L) 06/30/2022 0115   PLT 183 06/30/2022 0115   MCV 98.6 06/30/2022 0115   MCH 30.2 06/30/2022 0115   MCHC 30.7 06/30/2022 0115   RDW 15.4 06/30/2022 0115   LYMPHSABS 1.0 06/27/2022 0805   MONOABS 0.5 06/27/2022 0805   EOSABS 0.3 06/27/2022 0805   BASOSABS 0.0 06/27/2022 0805    BMET    Component Value Date/Time   NA 132 (L) 06/30/2022 0115   K 4.1 06/30/2022 0115   CL 95 (L) 06/30/2022 0115   CO2 27 06/30/2022 0115   GLUCOSE 97 06/30/2022 0115   BUN 20 06/30/2022 0115   CREATININE 3.77 (H) 06/30/2022 0115   CALCIUM 9.1 06/30/2022 0115   GFRNONAA 16 (L) 06/30/2022 0115    INR    Component Value Date/Time   INR 1.3 (H) 06/23/2022 1755     Intake/Output Summary (Last 24 hours) at 06/30/2022 0547 Last data filed at 06/29/2022 2026 Gross per 24 hour  Intake 240 ml  Output --  Net 240 ml      Assessment/Plan:  72 y.o. male is s/p:  arch aortogram via right CFA with right radial artery angioplasty on 06/25/2022 by Dr. Chestine Spore    5 Days Post-Op   -pt with brisk doppler flow right radial.  Fingertip demarcating.  For f/u with hand surgery outpatient -pt for PD cath today-continue npo.  Pt does not have any questions this am.  -DVT prophylaxis:  sq heparin discontinued after this am dose and ordered to restart this evening.  -continue asa/statin/plavix   Doreatha Massed,  PA-C [facility] [phone] 06/30/2022 5:47 AM   VASCULAR STAFF ADDENDUM: I have independently interviewed and examined the patient. I agree with the above.  Removal of PD catheter today.    Fara Olden, MD [facility] Phone Number: [phone] 06/30/2022 5:31 PM

## 2022-06-30 NOTE — Anesthesia Procedure Notes (Signed)
Procedure Name: Intubation Date/Time: 06/30/2022 5:59 PM  Performed by: Tressia Miners, CRNAPre-anesthesia Checklist: Patient identified, Emergency Drugs available, Suction available, Patient being monitored and Timeout performed Patient Re-evaluated:Patient Re-evaluated prior to induction Oxygen Delivery Method: Circle system utilized Preoxygenation: Pre-oxygenation with 100% oxygen Induction Type: IV induction Ventilation: Mask ventilation without difficulty Laryngoscope Size: Mac and 3 Grade View: Grade I Tube type: Oral Tube size: 7.5 mm Number of attempts: 1 Airway Equipment and Method: Stylet Placement Confirmation: ETT inserted through vocal cords under direct vision, positive ETCO2 and breath sounds checked- equal and bilateral Secured at: 23 cm Tube secured with: Tape Dental Injury: Teeth and Oropharynx as per pre-operative assessment  Comments: Smooth IV Induction. Eyes taped. Easy mask. DL x 1 with grade 1 view. Atraumatically placed, teeth and lip remain intact as pre-op. Secured with tape. Bilateral breath sounds +/=, EtCO2 +, Adequate TV, VSS.

## 2022-06-30 NOTE — Progress Notes (Addendum)
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  He was admitted to [facility] from 5 /1 until 05/13/2022 due to uremia from that time he was transitioned from PD to HD. He is just from the hospital stay to nursing home regimen overall doing well per patient and his wife.  -About 3 weeks ago he started having pain in his right hand  and progressively gotten worse and he was sent to the ED on 06/04/2022 for complaints of right fingertip pain and at that time he had good capillary refill images reviewed from that day and he had an arterial Doppler on that day without significant stenosis or impaired blood flow. He discharged back to the nursing facility but since then the pain has continued and worsened and he developed dark discoloration of the right ring finger on his right hand and ulcer. Upon evaluation of the ED he is moderately hypertensive and lab work was done. Vascular surgery and hand surgery was consulted and vascular surgery evaluated and are planning a angiogram with a transfemoral approach with a focus in the right upper extremity to evaluate his inflow.    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 -Patient s/p arch aortogram with right upper extremity arteriogram and right radial artery angioplasty on 06/25/2022 per Dr. Chestine Spore.   -Per vascular surgery patient optimized and recommending continuation of aspirin and statin and starting Plavix when okay from hand surgery point of view.   -Discussed with PA, Earney Hamburg, covering for orthopedics/hand surgery who recommended resumption of Plavix with outpatient follow-up with Dr. Yehuda Budd, hand surgeon in the next 1 to 2 weeks for further management.   -Continue Plavix 75 mg daily.   -Vascular surgery following. -Supportive care, pain management.  2.  ESRD on HD TTS -Patient seen in consultation by nephrology, underwent hemodialysis on 06/25/2022, will undergo hemodialysis, today 06/27/2022 per nephrology and then back to regular schedule today..   -Per nephrology PD catheter to be removed today, 06/30/2022 per vascular surgery.  -Patient for hemodialysis tomorrow 07/01/2022. -Per nephrology.  3.  Chronic back and neck pain -Continue home regimen buprenorphine 2 mg SL at bedtime, oxycodone 5 mg p.o. every 4 hours as needed moderate pain and IV fentanyl 50 mcg every hour as needed moderate pain.  4.  Hypertension -Currently stable. -Resume home regimen Demadex.   5.  Hypothyroidism -Synthroid.   6.  Depression/anxiety -Continue Lexapro, BuSpar.    7.  Anemia of chronic kidney disease -Hemoglobin currently at 8.8 from 8.5 from 8.0 from 7.8 from 7.0 from 8.5 on admission.  -Status post  transfusion 1 unit PRBCs 06/26/2022 -Anemia panel with iron level of 19, TIBC of 164, ferritin of 793, folate of 11.8. -Ferrlecit iron load ordered per nephrology, aranesp in HD. -Per nephrology.  8.  GERD -Continue PPI.   9.  Obesity -BMI 33.52 kg/m. -Lifestyle modification -Outpatient follow-up with PCP.  10.   Hyponatremia -On HD. -Stable.  11.  Hypomagnesemia -Magnesium of 1.4. -Will defer replacement to nephrology.   DVT prophylaxis: Heparin Code Status: Full Family Communication: Updated patient.  Updated wife on the telephone.  Disposition: Back to skilled nursing facility once cleared by vascular surgery and nephrology hopefully tomorrow.   Status is: Inpatient Remains inpatient appropriate because: Severity of illness   Consultants:  Hand surgeon/orthopedics: Dr. Yehuda Budd 06/24/2022 Vascular surgery: Dr. Chestine Spore 06/23/2022 Nephrology: Dr. Signe Colt 06/24/2022  Procedures:  Transfuse 1 unit PRBCs 06/26/2022 PD catheter removal pending 06/30/2022  Antimicrobials:  Anti-infectives (From admission, onward)    Start     Dose/Rate Route Frequency Ordered Stop   06/30/22 0800  ceFAZolin (ANCEF) IVPB 1 g/50 mL premix       Note to Pharmacy: Send with pt to OR   1 g 100 mL/hr over 30 Minutes Intravenous On call 06/29/22 0717 07/01/22 0800         Subjective: Sitting up in chair.  Denies any chest pain.  No shortness of breath.  No abdominal pain.  Still with complaints of right finger pain which she states is controlled on current pain regimen.   Objective: Vitals:   06/29/22 2045 06/30/22 0000 06/30/22 0449 06/30/22 0749  BP: (!) 122/97 (!) 137/90 (!) 147/79 110/69  Pulse: 96 65 (!) 131 65  Resp: 14 16 15 18   Temp: 98 F (36.7 C) 98.1 F (36.7 C) 98 F (36.7 C) 98.1 F (36.7 C)  TempSrc: Oral Oral Oral Oral  SpO2: 100% 100% 94%   Weight:      Height:        Intake/Output Summary (Last 24 hours) at 06/30/2022 1055 Last data filed at 06/29/2022 2026 Gross per 24 hour  Intake 240 ml  Output --  Net 240 ml    Filed Weights   06/27/22 0815 06/27/22 1254 06/28/22 1811  Weight: 92.1 kg 90.1 kg 88.5 kg    Examination:  General exam: NAD. Respiratory system: CTAB.  No wheezes, no crackles, no rhonchi.  Normal respiratory effort.   Cardiovascular system: RRR no murmurs rubs  or gallops.  No JVD.  1+ bilateral lower extremity edema.   Gastrointestinal system: Abdomen is soft, nontender, nondistended, positive bowel sounds.  No rebound.  No guarding.  Central nervous system: Alert and oriented. No focal neurological deficits. Extremities: Right ring finger with dry gangrene with some streaking erythema up the hand.  Ring finger and index finger less tender to palpation.  Right index finger with some necrotic tissue on the tip..  Symmetric 5 x 5 power. Skin: No rashes, lesions or ulcers Psychiatry: Judgement and insight appear normal. Mood & affect appropriate.     Data Reviewed: I have personally reviewed following labs and imaging studies  CBC: Recent Labs  Lab 06/25/22 0259 06/26/22 0127 06/26/22 1913 06/27/22 0805 06/28/22 0130 06/29/22 0123 06/30/22 0115  WBC 7.0 5.7  --  7.1 6.5  --  7.6  NEUTROABS 5.1 3.8  --  5.2  --   --   --   HGB 7.3* 7.0* 8.8* 7.8* 8.0* 8.5* 8.8*  HCT 23.9* 23.1* 28.4* 25.7* 25.7* 27.3* 28.7*  MCV 99.2  100.0  --  97.7 97.0  --  98.6  PLT 188 163  --  180 162  --  183     Basic Metabolic Panel: Recent Labs  Lab 06/25/22 0259 06/26/22 0127 06/27/22 0805 06/28/22 0130 06/29/22 0123 06/30/22 0115  NA 133* 137 134* 133* 133* 132*  K 5.2* 4.2 4.3 3.4* 3.4* 4.1  CL 98 97* 96* 95* 96* 95*  CO2 22 27 24 27 29 27   GLUCOSE 88 84 106* 91 110* 97  BUN 66* 32* 46* 20 11 20   CREATININE 6.44* 4.13* 5.63* 3.41* 2.40* 3.77*  CALCIUM 8.3* 8.3* 8.5* 8.7* 8.7* 9.1  MG 1.4* 1.4* 1.6* 1.4* 1.4* 1.4*  PHOS 5.6* 4.4 6.0* 3.5 2.5  --      GFR: Estimated Creatinine Clearance: 19 mL/min (A) (by C-G formula based on SCr of 3.77 mg/dL (H)).  Liver Function Tests: Recent Labs  Lab 06/23/22 1755 06/25/22 0259 06/26/22 0127 06/27/22 0805 06/28/22 0130 06/29/22 0123  AST 14* 19  --   --   --   --   ALT 12 11  --   --   --   --   ALKPHOS 138* 113  --   --   --   --   BILITOT 0.6 0.3  --   --   --   --   PROT 6.1* 5.8*  --   --    --   --   ALBUMIN 2.5* 2.3* 2.1* 2.3* 2.3* 2.4*     CBG: No results for input(s): "GLUCAP" in the last 168 hours.   Recent Results (from the past 240 hour(s))  Surgical PCR screen     Status: None   Collection Time: 06/23/22  9:44 PM   Specimen: Nasal Mucosa; Nasal Swab  Result Value Ref Range Status   MRSA, PCR NEGATIVE NEGATIVE Final   Staphylococcus aureus NEGATIVE NEGATIVE Final    Comment: (NOTE) The Xpert SA Assay (FDA approved for NASAL specimens in patients 29 years of age and older), is one component of a comprehensive surveillance program. It is not intended to diagnose infection nor to guide or monitor treatment. Performed at [facility], [address], [place], [place] [address]   Urine Culture (for pregnant, neutropenic or urologic patients or patients with an indwelling urinary catheter)     Status: None   Collection Time: 06/27/22  8:07 AM   Specimen: Urine, Clean Catch  Result Value Ref Range Status   Specimen Description URINE, CLEAN CATCH  Final   Special Requests NONE  Final   Culture   Final    NO GROWTH Performed at [facility], [address] [place], [place] [address]    Report Status 06/28/2022 FINAL  Final         Radiology Studies: No results found.      Scheduled Meds:  aspirin EC  81 mg Oral Daily   buprenorphine  2 mg Sublingual QHS   busPIRone  10 mg Oral BID   calcitRIOL  0.25 mcg Oral Daily   calcium acetate  667 mg Oral TID WC   Chlorhexidine Gluconate Cloth  6 each Topical Q0600   clopidogrel  75 mg Oral Daily   darbepoetin (ARANESP) injection - DIALYSIS  100 mcg Subcutaneous Q Thu-1800   escitalopram  20 mg Oral Daily   heparin injection (subcutaneous)  5,000 Units Subcutaneous Q8H   levothyroxine  250 mcg Oral QAC breakfast   melatonin  5  mg Oral QHS   pantoprazole  40 mg Oral Daily   rosuvastatin  10 mg Oral QHS   sodium chloride flush  3 mL Intravenous Q12H   Continuous Infusions:  sodium chloride       ceFAZolin (ANCEF) IV       LOS: 7 days    Time spent: 35 minutes    Ramiro Harvest, MD [facility]   To contact the attending provider between 7A-7P or the covering provider during after hours 7P-7A, please log into the web site [email or web address] and access using universal [place] password for that web site. If you do not have the password, please call the hospital operator.  06/30/2022, 10:55 AM

## 2022-07-01 ENCOUNTER — Encounter (HOSPITAL_COMMUNITY): Payer: Self-pay | Admitting: Vascular Surgery

## 2022-07-01 DIAGNOSIS — I998 Other disorder of circulatory system: Secondary | ICD-10-CM

## 2022-07-01 DIAGNOSIS — M79641 Pain in right hand: Principal | ICD-10-CM

## 2022-07-01 LAB — CBC
HCT: 24.2 % — ABNORMAL LOW (ref 39.0–52.0)
Hemoglobin: 7.4 g/dL — ABNORMAL LOW (ref 13.0–17.0)
MCH: 31.1 pg (ref 26.0–34.0)
MCHC: 30.6 g/dL (ref 30.0–36.0)
MCV: 101.7 fL — ABNORMAL HIGH (ref 80.0–100.0)
Platelets: 209 10*3/uL (ref 150–400)
RBC: 2.38 MIL/uL — ABNORMAL LOW (ref 4.22–5.81)
RDW: 15.3 % (ref 11.5–15.5)
WBC: 10.5 10*3/uL (ref 4.0–10.5)
nRBC: 0 % (ref 0.0–0.2)

## 2022-07-01 LAB — RENAL FUNCTION PANEL
Albumin: 2.2 g/dL — ABNORMAL LOW (ref 3.5–5.0)
Anion gap: 11 (ref 5–15)
BUN: 31 mg/dL — ABNORMAL HIGH (ref 8–23)
CO2: 24 mmol/L (ref 22–32)
Calcium: 8.2 mg/dL — ABNORMAL LOW (ref 8.9–10.3)
Chloride: 96 mmol/L — ABNORMAL LOW (ref 98–111)
Creatinine, Ser: 5.07 mg/dL — ABNORMAL HIGH (ref 0.61–1.24)
GFR, Estimated: 12 mL/min — ABNORMAL LOW (ref >60)
Glucose, Bld: 147 mg/dL — ABNORMAL HIGH (ref 70–99)
Phosphorus: 6.1 mg/dL — ABNORMAL HIGH (ref 2.5–4.6)
Potassium: 5 mmol/L (ref 3.5–5.1)
Sodium: 131 mmol/L — ABNORMAL LOW (ref 135–145)

## 2022-07-01 LAB — BPAM RBC
Blood Product Expiration Date: 202407242359
Unit Type and Rh: 5100

## 2022-07-01 LAB — TYPE AND SCREEN: Antibody Screen: NEGATIVE

## 2022-07-01 LAB — MAGNESIUM: Magnesium: 1.3 mg/dL — ABNORMAL LOW (ref 1.7–2.4)

## 2022-07-01 LAB — PREPARE RBC (CROSSMATCH)

## 2022-07-01 MED ORDER — ASPIRIN 81 MG PO TBEC: 81.0000 mg | DELAYED_RELEASE_TABLET | Freq: Every day | ORAL | 12 refills | Status: DC

## 2022-07-01 MED ORDER — DARBEPOETIN ALFA 100 MCG/0.5ML IJ SOSY: 100.0000 ug | PREFILLED_SYRINGE | INTRAMUSCULAR | Status: DC

## 2022-07-01 MED ORDER — MIDODRINE HCL 5 MG PO TABS
10.0000 mg | ORAL_TABLET | ORAL | Status: DC
  Administered 2022-07-01: 10 mg via ORAL
  Filled 2022-07-01 (×3): qty 2

## 2022-07-01 MED ORDER — HYDROXYZINE HCL 10 MG PO TABS: 10.0000 mg | ORAL_TABLET | Freq: Three times a day (TID) | ORAL | 0 refills | Status: DC | PRN

## 2022-07-01 MED ORDER — SODIUM CHLORIDE 0.9% IV SOLUTION: Freq: Once | INTRAVENOUS | Status: AC

## 2022-07-01 MED ORDER — LIDOCAINE HCL (PF) 1 % IJ SOLN: 5.0000 mL | INTRAMUSCULAR | Status: DC | PRN

## 2022-07-01 MED ORDER — HEPARIN SODIUM (PORCINE) 1000 UNIT/ML DIALYSIS
20.0000 [IU]/kg | INTRAMUSCULAR | Status: DC | PRN
  Administered 2022-07-01: 1800 [IU] via INTRAVENOUS_CENTRAL
  Filled 2022-07-01 (×2): qty 2

## 2022-07-01 MED ORDER — LIDOCAINE-PRILOCAINE 2.5-2.5 % EX CREA: 1.0000 | TOPICAL_CREAM | CUTANEOUS | Status: DC | PRN

## 2022-07-01 MED ORDER — MIDODRINE HCL 10 MG PO TABS: 10.0000 mg | ORAL_TABLET | ORAL | 1 refills | Status: DC

## 2022-07-01 MED ORDER — OXYCODONE HCL 5 MG PO TABS: 5.0000 mg | ORAL_TABLET | ORAL | 0 refills | Status: DC | PRN

## 2022-07-01 MED ORDER — BUPRENORPHINE HCL 2 MG SL SUBL: 2.0000 mg | SUBLINGUAL_TABLET | Freq: Every day | SUBLINGUAL | 0 refills | Status: DC

## 2022-07-01 MED ORDER — CLOPIDOGREL BISULFATE 75 MG PO TABS: 75.0000 mg | ORAL_TABLET | Freq: Every day | ORAL | 1 refills | Status: DC

## 2022-07-01 MED ORDER — PENTAFLUOROPROP-TETRAFLUOROETH EX AERO: 1.0000 | INHALATION_SPRAY | CUTANEOUS | Status: DC | PRN

## 2022-07-01 MED ORDER — BUSPIRONE HCL 10 MG PO TABS: 10.0000 mg | ORAL_TABLET | ORAL | 0 refills | Status: DC

## 2022-07-01 NOTE — Progress Notes (Signed)
  Progress Note    07/01/2022 4:29 AM 1 Day Post-Op  Subjective:  sleeping comfortably when I entered room. No complaints upon waking   Vitals:   06/30/22 2332 07/01/22 0402  BP: (!) 119/57 (!) 130/47  Pulse: 65 72  Resp: 15 15  Temp: 97.7 F (36.5 C) 98 F (36.7 C)  SpO2: 100% 100%   Physical Exam: Cardiac:  regular Lungs:  non labored Incisions:  abdominal incisions are clean, dry and intact Extremities:  Right radial doppler signal. Gangrenous 4th finger dry and unchanged, mild erythema along dorsal finger, dry ulcer distal 2nd finger tip Abdomen:  soft, non distended, some tenderness around incisions Neurologic: alert and oriented  CBC    Component Value Date/Time   WBC 7.6 06/30/2022 0115   RBC 2.91 (L) 06/30/2022 0115   HGB 8.8 (L) 06/30/2022 0115   HCT 28.7 (L) 06/30/2022 0115   PLT 183 06/30/2022 0115   MCV 98.6 06/30/2022 0115   MCH 30.2 06/30/2022 0115   MCHC 30.7 06/30/2022 0115   RDW 15.4 06/30/2022 0115   LYMPHSABS 1.0 06/27/2022 0805   MONOABS 0.5 06/27/2022 0805   EOSABS 0.3 06/27/2022 0805   BASOSABS 0.0 06/27/2022 0805    BMET    Component Value Date/Time   NA 132 (L) 06/30/2022 0115   K 4.1 06/30/2022 0115   CL 95 (L) 06/30/2022 0115   CO2 27 06/30/2022 0115   GLUCOSE 97 06/30/2022 0115   BUN 20 06/30/2022 0115   CREATININE 3.77 (H) 06/30/2022 0115   CALCIUM 9.1 06/30/2022 0115   GFRNONAA 16 (L) 06/30/2022 0115    INR    Component Value Date/Time   INR 1.3 (H) 06/23/2022 1755     Intake/Output Summary (Last 24 hours) at 07/01/2022 0429 Last data filed at 07/01/2022 0427 Gross per 24 hour  Intake 620 ml  Output 20 ml  Net 600 ml     Assessment/Plan:  72 y.o. male is s/p removal of PD catheter 1 Day Post-Op . Arch Aortogram via right CFA with right radial artery angioplasty on 06/25/22  Doppler right radial signal. Allowing 4th finger to demarcate. Has outpatient Hang surgery follow up Abdominal incisions are intact and well  appearing Continue Aspirin/ Statin/ Plavix Stable from vascular standpoint for discharge Will arrange follow up in 1 month for arterial duplex of right upper extremity    Graceann Congress, PA-C [facility] [phone] 07/01/2022 4:29 AM

## 2022-07-01 NOTE — Progress Notes (Signed)
Occupational Therapy Treatment Patient Details Name: Chase [name] MRN: [id number] DOB: 12-21-1950 Today's Date: 07/01/2022   History of present illness 72 y.o. male presenting to [facility] 6/17 with dry gangrene of R hand s/p 6/19 arch aortogram with right upper extremity arteriogram. PMH includes back pain, depression, CAD, DM, CKD, HTN, SDH, ESRD   OT comments  Pt progressing towards goals, improved mobility in R hand pt able to oppose digits and squeeze washcloth  and perform seated therex during session, performed standing grooming task at sink using RUE to hold toothbrush. Pt needing min A for ADLs, mod A for bed mobility, and min A for transfers with RW. Encouraged continued mobility of R hand for edema control, joint stiffness. Pt presenting with impairments listed below, will follow acutely. Patient will benefit from continued inpatient follow up therapy, <3 hours/day to maximize safety/ind with ADLs/functional mobility.    Recommendations for follow up therapy are one component of a multi-disciplinary discharge planning process, led by the attending physician.  Recommendations may be updated based on patient status, additional functional criteria and insurance authorization.    Assistance Recommended at Discharge Intermittent Supervision/Assistance  Patient can return home with the following  A lot of help with walking and/or transfers;A lot of help with bathing/dressing/bathroom;Assistance with cooking/housework;Assist for transportation;Help with stairs or ramp for entrance   Equipment Recommendations  None recommended by OT    Recommendations for Other Services      Precautions / Restrictions Precautions Precautions: Fall Precaution Comments: fall hx, low vision Restrictions Weight Bearing Restrictions: No       Mobility Bed Mobility Overal bed mobility: Needs Assistance Bed Mobility: Supine to Sit     Supine to sit: Mod assist     General bed mobility comments: mod A  for trunk elevation    Transfers Overall transfer level: Needs assistance Equipment used: Rolling walker (2 wheels) Transfers: Sit to/from Stand Sit to Stand: Min assist                 Balance Overall balance assessment: Needs assistance Sitting-balance support: Feet supported Sitting balance-Leahy Scale: Good     Standing balance support: Bilateral upper extremity supported, During functional activity, Reliant on assistive device for balance Standing balance-Leahy Scale: Poor Standing balance comment: reliant on RW support                           ADL either performed or assessed with clinical judgement   ADL Overall ADL's : Needs assistance/impaired     Grooming: Minimal assistance;Standing Grooming Details (indicate cue type and reason): assist for squeezing toothpaste                 Toilet Transfer: Minimal assistance;Ambulation;Regular Toilet;Rolling walker (2 wheels) Toilet Transfer Details (indicate cue type and reason): simulated via functional mobility         Functional mobility during ADLs: Minimal assistance;Rolling walker (2 wheels)      Extremity/Trunk Assessment Upper Extremity Assessment RUE Deficits / Details: Pt reports pain with AROM, shoulder flex ~90 degrees 3/5 MMT, can touch face. R ring finger blackened/necrotic. R fingers limited to to ~40 degrees PIP flexion, able to make parital hook fist.Can oppose digits   Lower Extremity Assessment Lower Extremity Assessment: Defer to PT evaluation        Vision   Additional Comments: pt reports he wears glasses, but not present in room, built up call bell for pt   Perception Perception Perception: Not  tested   Praxis Praxis Praxis: Not tested    Cognition Arousal/Alertness: Awake/alert Behavior During Therapy: Flat affect Overall Cognitive Status: Impaired/Different from baseline Area of Impairment: Problem solving                             Problem  Solving: Requires verbal cues, Decreased initiation, Difficulty sequencing, Slow processing, Requires tactile cues General Comments: pt with slowed response to questions requiring cuing for sequencing        Exercises General Exercises - Upper Extremity Elbow Flexion: AROM, Right, 5 reps, Seated Elbow Extension: AROM, Right, 5 reps, Seated Wrist Flexion: AROM, 5 reps, Right, Seated Wrist Extension: AROM, Right, 5 reps, Seated Digit Composite Flexion: AROM, Right, 5 reps, Seated Composite Extension: AROM, Right, 5 reps, Seated Other Exercises Other Exercises: R isolated prone digit extension Other Exercises: RUE wash cloth squeeze x5 Other Exercises: RUE digit opposition    Shoulder Instructions       General Comments VSS on RA    Pertinent Vitals/ Pain       Pain Assessment Pain Assessment: Faces Pain Score: 5  Faces Pain Scale: Hurts even more Pain Location: R hand and forearm Pain Descriptors / Indicators: Discomfort, Grimacing, Guarding Pain Intervention(s): Limited activity within patient's tolerance, Monitored during session, Repositioned  Home Living                                          Prior Functioning/Environment              Frequency  Min 3X/week        Progress Toward Goals  OT Goals(current goals can now be found in the care plan section)  Progress towards OT goals: Progressing toward goals  Acute Rehab OT Goals Patient Stated Goal: none stated OT Goal Formulation: With patient Time For Goal Achievement: 07/10/22 Potential to Achieve Goals: Good ADL Goals Pt Will Perform Grooming: standing;with min guard assist Pt Will Perform Lower Body Dressing: sitting/lateral leans;with supervision;with set-up Pt Will Transfer to Toilet: with min guard assist;ambulating Pt/caregiver will Perform Home Exercise Program: Increased ROM;Right Upper extremity;With written HEP provided;Independently;Increased strength Additional ADL Goal  #2: Pt will achieve 70 degrees PIP flexion in R hand in preparation for functional grasp activites  Plan Frequency remains appropriate;Discharge plan needs to be updated    Co-evaluation                 AM-PAC OT "6 Clicks" Daily Activity     Outcome Measure   Help from another person eating meals?: A Little Help from another person taking care of personal grooming?: A Little Help from another person toileting, which includes using toliet, bedpan, or urinal?: A Little Help from another person bathing (including washing, rinsing, drying)?: A Lot Help from another person to put on and taking off regular upper body clothing?: A Little Help from another person to put on and taking off regular lower body clothing?: A Lot 6 Click Score: 16    End of Session Equipment Utilized During Treatment: Rolling walker (2 wheels);Gait belt  OT Visit Diagnosis: Unsteadiness on feet (R26.81);Other abnormalities of gait and mobility (R26.89);History of falling (Z91.81);Muscle weakness (generalized) (M62.81);Pain Pain - Right/Left: Right Pain - part of body: Hand   Activity Tolerance Patient tolerated treatment well   Patient Left in bed;with call bell/phone within reach;with bed  alarm set (seated EOB)   Nurse Communication Mobility status        Time: 1610-9604 OT Time Calculation (min): 34 min  Charges: OT General Charges $OT Visit: 1 Visit OT Treatments $Self Care/Home Management : 8-22 mins $Therapeutic Exercise: 8-22 mins  Carver Fila, OTD, OTR/L SecureChat Preferred Acute Rehab [phone]   Carver Fila Koonce 07/01/2022, 1:45 PM

## 2022-07-01 NOTE — Progress Notes (Signed)
OT Cancellation Note  Patient Details Name: Chase [name] MRN: [id number] DOB: June 23, 1950   Cancelled Treatment:    Reason Eval/Treat Not Completed: Patient at procedure or test/ unavailable (at HD)  Carver Fila, OTD, OTR/L SecureChat Preferred Acute Rehab [phone]   Dalphine Handing 07/01/2022, 8:32 AM

## 2022-07-01 NOTE — Procedures (Signed)
HD Note:  Some information was entered later than the data was gathered due to patient care needs. The stated time with the data is accurate.  Received patient in bed to unit.  Alert and oriented to person and situation Informed consent signed and in chart.   TX duration: 3.75 hours  Patient BP did not support UF at times during treatment.  Patient was given 100 ml saline bolus, see flowsheet. Patient asymptomatic during low BP episodes.   Spoke with Susann Givens, PA concerning BP and UF, new order for midodrine and new goal of 1000 ml UF and if not tolerated, keep even. In the last 30 min of treatment, patient began to complain of intense pain in his left hand.  See MAR.  Transported back to the room  Alert, without acute distress.  Hand-off given to patient's nurse.   Access used: Right upper arm fistula Access issues: None  Patient BP would not support UF pull and the machine was set to keep even.  Patient did receive 100 ml bolus for BP support. Total UF 100+ to patient     [facility]

## 2022-07-01 NOTE — Progress Notes (Signed)
During blood transfusion bp dropped down to 78/45. Patient is asymptomatic. Stating that he feels great. Recheck bp is 79/42. MD made aware. No new orders given. Will continue to monitor.

## 2022-07-01 NOTE — TOC Progression Note (Signed)
Transition of Care [facility]) - Progression Note    Patient Details  Name: Chase [name] MRN: [id number] Date of Birth: 1950/04/13  Transition of [facility]) CM/SW Contact  Inis Sizer, LCSW Phone Number: 07/01/2022, 10:37 AM  Clinical Narrative:    CSW spoke with Lawerance Cruel at [facility] who states the facility can accept the patient back to day.  Patient will return to the facility via PTAR - transport to be called once patient returns from HD. The number to call for report is [phone].  Expected Discharge Plan: Skilled Nursing Facility Barriers to Discharge: Continued Medical Work up  Expected Discharge Plan and Services In-house Referral: Clinical Social Work     Living arrangements for the past 2 months: Skilled Nursing Facility                      Social Determinants of Health (SDOH) Interventions SDOH Screenings   Food Insecurity: No Food Insecurity (06/23/2022)  Housing: Low Risk  (06/23/2022)  Transportation Needs: No Transportation Needs (06/23/2022)  Utilities: Not At Risk (06/23/2022)  Tobacco Use: Low Risk  (07/01/2022)    Readmission Risk Interventions     No data to display

## 2022-07-01 NOTE — Progress Notes (Addendum)
[facility] Progress Note   Assessment/ Plan:   Assessment/Plan: 1 R finger/ hand gangrene: [facility]VS followings/p angio with PTA to R radial artery. Hand following, allowing 4th finger to demarcate. Outpatient f/u with hand surgery, [facility]VS.  2 ESRD: TTS normally.  Back on schedule. HD Tuesday 6/25 3 Hypertension/ volume: UF as able, appears grossly euvolemic. Hypotensive event today. Previously on midodrine - will order 10 mg pre HD on TTS.  4. 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 Getting another unit prbcs today  5. Metabolic Bone Disease: binders/ vitamins when eating 6.  PD catheter- removed in OR Monday. Appreciate VVS  7.  Dispo: ok for dc today after transfusion. BP dropped in HD but no symptoms now and BP's are back in low normal range. We are adding midodrine 10mg  TTS pre HD. He has been on it before.   Tomasa Blase PA-C [facility] 07/01/2022,2:19 PM  Pt seen, examined and agree w assess/plan as above with additions as indicated.  [facility] [employer] [facility] 07/01/2022, 2:21 PM     Subjective:    Seen in dialysis - had hypotensive episode this am. States has happened to him at outpatient center. Hb trend down 7.4 this am.    Objective:   BP (!) 116/45   Pulse 75   Temp (!) 96.5 F (35.8 C)   Resp 15   Ht 5\' 6"  (1.676 m)   Wt 90.5 kg   SpO2 93%   BMI 32.20 kg/m   Physical Exam: GEN NAD, lying in bed PULM clear CV RRR ABD soft EXT no LE edema, R hand fingers demarcating NEURO slow to respond ACCESS: L RC AVF + T/B, PD catheter inplace  Labs: BMET Recent Labs  Lab 06/25/22 0259 06/26/22 0127 06/27/22 0805 06/28/22 0130 06/29/22 0123 06/30/22 0115 07/01/22 0901  NA 133* 137 134* 133* 133* 132* 131*  K 5.2* 4.2 4.3 3.4* 3.4* 4.1 5.0  CL 98 97* 96* 95* 96* 95* 96*  CO2 22 27 24 27 29 27 24   GLUCOSE 88 84 106* 91 110* 97 147*  BUN 66* 32* 46* 20 11 20  31*  CREATININE  6.44* 4.13* 5.63* 3.41* 2.40* 3.77* 5.07*  CALCIUM 8.3* 8.3* 8.5* 8.7* 8.7* 9.1 8.2*  PHOS 5.6* 4.4 6.0* 3.5 2.5  --  6.1*   CBC Recent Labs  Lab 06/25/22 0259 06/26/22 0127 06/26/22 1913 06/27/22 0805 06/28/22 0130 06/29/22 0123 06/30/22 0115 07/01/22 0901  WBC 7.0 5.7  --  7.1 6.5  --  7.6 10.5  NEUTROABS 5.1 3.8  --  5.2  --   --   --   --   HGB 7.3* 7.0*   < > 7.8* 8.0* 8.5* 8.8* 7.4*  HCT 23.9* 23.1*   < > 25.7* 25.7* 27.3* 28.7* 24.2*  MCV 99.2 100.0  --  97.7 97.0  --  98.6 101.7*  PLT 188 163  --  180 162  --  183 209   < > = values in this interval not displayed.      Medications:     sodium chloride   Intravenous Once   aspirin EC  81 mg Oral Daily   buprenorphine  2 mg Sublingual QHS   busPIRone  10 mg Oral BID   calcitRIOL  0.25 mcg Oral Daily   calcium acetate  667 mg Oral TID WC   Chlorhexidine Gluconate Cloth  6 each Topical Q0600  clopidogrel  75 mg Oral Daily   darbepoetin (ARANESP) injection - DIALYSIS  100 mcg Subcutaneous Q Thu-1800   escitalopram  20 mg Oral Daily   heparin injection (subcutaneous)  5,000 Units Subcutaneous Q8H   levothyroxine  250 mcg Oral QAC breakfast   melatonin  5 mg Oral QHS   midodrine  10 mg Oral Q T,Th,Sa-HD   pantoprazole  40 mg Oral Daily   rosuvastatin  10 mg Oral QHS   sodium chloride flush  3 mL Intravenous Q12H   torsemide  20 mg Oral Daily

## 2022-07-01 NOTE — TOC Progression Note (Signed)
Transition of Care [facility]) - Progression Note    Patient Details  Name: Chase [name] MRN: [id number] Date of Birth: 1950/06/14  Transition of Care [facility]) CM/SW Contact  Eduard Roux, [place] Phone Number: 07/01/2022, 4:09 PM  Clinical Narrative:     CSW received message, patient's spouse requested CSW to call.   She requested the patient d/c tomorrow, since it will be late before he is done w/ receiving blood. It will be too late to d/c to SNF.  [facility] & MD updated   TOC will continue to follow and assist with discharge planning.  Antony Blackbird, MSW, LCSW Clinical Social Worker     Expected Discharge Plan: Skilled Nursing Facility Barriers to Discharge: Continued Medical Work up  Expected Discharge Plan and Services In-house Referral: Clinical Social Work     Living arrangements for the past 2 months: Skilled Nursing Facility                                       Social Determinants of Health (SDOH) Interventions SDOH Screenings   Food Insecurity: No Food Insecurity (06/23/2022)  Housing: Low Risk  (06/23/2022)  Transportation Needs: No Transportation Needs (06/23/2022)  Utilities: Not At Risk (06/23/2022)  Tobacco Use: Low Risk  (07/01/2022)    Readmission Risk Interventions     No data to display

## 2022-07-01 NOTE — Progress Notes (Signed)
PT Cancellation Note  Patient Details Name: Chase [name] MRN: [id number] DOB: February 18, 1950   Cancelled Treatment:    Reason Eval/Treat Not Completed: (P) Medical issues which prohibited therapy (Pt starting a blood transfusion and with low BP, so RN deferring PT. Will continue to follow per PT POC.)   Johny Shock 07/01/2022, 4:26 PM

## 2022-07-01 NOTE — Discharge Summary (Signed)
Physician Discharge Summary  Chase [name] ZYS:[id number] DOB: 1950-11-03 DOA: 06/23/2022  PCP: [name], MD  Admit date: 06/23/2022 Discharge date: 07/01/2022  Time spent: 60 minutes  Recommendations for Outpatient Follow-up:  Follow-up with vascular surgery in 1 month. Follow-up with MD at SNF. Follow-up with Dr. Yehuda Budd, hand surgeon in 1 to 2 weeks. Follow-up with regular hemodialysis unit on 07/03/2022.   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  Diet recommendation: Heart healthy just like  Filed Weights   06/27/22 1254 06/28/22 1811 07/01/22 0802  Weight: 90.1 kg 88.5 kg 90.5 kg    History of present illness:  HPI per Dr. Oneal Deputy is a 72 y.o. male with medical history significant for ESRD on Tuesday Thursday Saturday dialysis, neck pain, non-insulin-dependent type 2 diabetes, hypertension, hypothyroidism being admitted to the hospital with dry gangrene of the right hand.  Patient was admitted to [facility] 5/1 to 5/7 due to uremia, at which time he was transitioned from PD to HD.  He was discharged from that hospital stay to nursing home, where he has overall been doing well, per the patient and his wife.  He started having pain in the right hand about 3 weeks ago.  He was seen in this ER on 5/29 with complaints of right fingertip pain, at that time he had good capillary refill, images were reviewed from that date.  He also had arterial Doppler on that day without evidence of significant stenosis or impaired blood flow.  He was discharged back to his nursing facility  that date, but since that time pain has continued and worsened, he has developed dark discoloration of the ring finger of the right hand.   ED Course: Upon evaluation in the ER, he is moderately hypertensive, but vital signs otherwise stable.  Lab work was done and without any acute abnormality, relatively stable at baseline.  He is due for hemodialysis tomorrow, he is on a Tuesday Thursday Saturday schedule.  Hospital Course:  #1 dry gangrene of the right ring finger and ischemic right ring finger -Patient admitted, seen in consultation by vascular surgery and hand surgeon. -Patient s/p arch aortogram with right upper extremity arteriogram and right radial artery angioplasty on 06/25/2022 per Dr. Chestine Spore.   -Per vascular surgery patient optimized and recommending continuation of aspirin and statin and starting Plavix when okay from hand surgery point of view.   -Discussed with PA, Earney Hamburg, covering for orthopedics/hand surgery who recommended resumption of Plavix with outpatient follow-up with Dr. Yehuda Budd, hand surgeon in the next 1 to 2 weeks for further management and to allow finger to demarcate..   -Patient subsequently placed on Plavix 75 mg daily.   -Outpatient follow-up with vascular surgery in 1 month for arterial duplex of right upper extremity.   2.  ESRD on HD TTS -Patient seen in consultation by nephrology, underwent hemodialysis on 06/25/2022, 06/27/2022 per nephrology and then back to regular schedule.   -PD catheter removed 06/30/2022 per vascular surgery.  -Patient underwent hemodialysis on his regular scheduled on 07/01/2022 on day of discharge.  -Per nephrology.  3.  Chronic back and neck pain -Patient was maintained on home regimen buprenorphine 2 mg SL at bedtime, oxycodone 5 mg p.o. every 4 hours as needed moderate pain and IV fentanyl 50 mcg every hour as needed moderate pain. -Pain was controlled during the hospitalization. -Outpatient follow-up.   4.   Hypertension/transient hypotension in HD -Remained stable.   -Home regimen Demadex initially held and subsequently resumed.   -Patient noted to have a transient bout of hypotension hemodialysis on 07/01/2022 and blood pressure returned back to low normal.   -Demadex discontinued.   -Patient to receive a unit PRBCs on day of discharge.   -Patient placed back on midodrine 10 mg with hemodialysis on TTS. -Outpatient follow-up.    5.  Hypothyroidism -Patient maintained on home regimen Synthroid.    6.  Depression/anxiety -Patient maintained on home regimen Lexapro, BuSpar.     7.  Anemia of chronic kidney disease -Hemoglobin noted at 7.4 on day of discharge from 8.8 from 8.5 from 8.0 from 7.8 from 7.0 from 8.5 on admission.  -Patient with no overt bleeding noted. -Status post transfusion 1 unit PRBCs 06/26/2022 -Anemia panel with iron level of 19, TIBC of 164, ferritin of 793, folate of 11.8. -Ferrlecit iron load ordered per nephrology, aranesp in HD. -Patient to be transfused 1 unit PRBCs prior to discharge on 07/01/2022. -Outpatient follow-up in hemodialysis for repeat labs.   8.  GERD -Patient maintained on PPI.    9.  Obesity -BMI 33.52 kg/m. -Lifestyle modification -Outpatient follow-up with PCP.   10.  Hyponatremia -On HD. -Stable.   11.  Hypomagnesemia -Magnesium of 1.4. -Replacement was deferred to nephrology.   -Outpatient follow-up.     Procedures: Transfuse 1 unit PRBCs 06/26/2022 PD catheter removal 06/30/2022 Transfused 1 unit PRBCs 07/01/2022 1.  Ultrasound-guided access right common femoral artery 2.  Limited arch aortogram 3.  Catheter selection of right axillary artery with dedicated right upper extremity arteriogram and catheter selection of right radial artery 4.  Right radial artery angioplasty (2.5 mm x 80 mm Sterling) for high grade 90% stenosis 5.  Mynx closure of the right common femoral artery 6.  35 minutes of monitored moderate conscious sedation  time--per vascular surgery: Dr. Chestine Spore 06/25/2022  Consultations: Hand surgeon/orthopedics: Dr. Yehuda Budd 06/24/2022 Vascular surgery: Dr. Chestine Spore 06/23/2022 Nephrology: Dr. Signe Colt 06/24/2022  Discharge Exam: Vitals:   07/01/22 1203 07/01/22 1243  BP: (!) 105/58 (!) 116/45  Pulse: 75   Resp: 15   Temp:    SpO2: 93%     General: NAD Cardiovascular: RRR no murmurs rubs or gallops.  No JVD.  No lower extremity edema. Respiratory: Clear to auscultation bilaterally anterior lung fields.  No wheezes, no crackles, no rhonchi.  Fair air movement.  Speaking in full sentences.  Discharge Instructions   Discharge Instructions     Diet - low sodium heart healthy   Complete by: As directed    Increase activity slowly   Complete by: As directed    No wound care   Complete by: As directed       Allergies as of 07/01/2022       Reactions   Gabapentin    Cognitive impairment per spouse   Lipitor [atorvastatin]    Severe muscle cramps per spouse   Pregabalin    Cognitive impairment. Dizziness.        Medication List     STOP taking these medications    torsemide 20 MG tablet Commonly known as: DEMADEX  TAKE these medications    acetaminophen 500 MG tablet Commonly known as: TYLENOL Take 1,000 mg by mouth every 8 (eight) hours as needed for moderate pain or mild pain.   ADULT NUTRITIONAL SUPPLEMENT + PO Take 60 mLs by mouth 2 (two) times daily.   albuterol 108 (90 Base) MCG/ACT inhaler Commonly known as: VENTOLIN HFA Inhale 2 puffs into the lungs every 4 (four) hours as needed for wheezing or shortness of breath.   aspirin EC 81 MG tablet Take 1 tablet (81 mg total) by mouth daily. Swallow whole. Start taking on: July 02, 2022   buprenorphine 2 MG Subl SL tablet Commonly known as: SUBUTEX Place 1 tablet (2 mg total) under the tongue at bedtime. What changed:  how much to take when to take this   busPIRone 10 MG tablet Commonly known as: BUSPAR Take 1-2  tablets (10-20 mg total) by mouth See admin instructions. Take 2 tablets in the AM and 1 tablet in the PM.   calcitRIOL 0.25 MCG capsule Commonly known as: ROCALTROL Take 0.25 mcg by mouth daily.   Calcium Acetate 667 MG Tabs Take 1 tablet by mouth 3 (three) times daily.   clopidogrel 75 MG tablet Commonly known as: PLAVIX Take 1 tablet (75 mg total) by mouth daily. Start taking on: July 02, 2022   Darbepoetin Alfa 100 MCG/0.5ML Sosy injection Commonly known as: ARANESP Inject 0.5 mLs (100 mcg total) into the skin every Thursday at 6pm. Start taking on: July 03, 2022   escitalopram 20 MG tablet Commonly known as: LEXAPRO Take 20 mg by mouth daily.   hydrOXYzine 10 MG tablet Commonly known as: ATARAX Take 1 tablet (10 mg total) by mouth 3 (three) times daily as needed for anxiety.   levothyroxine 125 MCG tablet Commonly known as: SYNTHROID Take 250 mcg by mouth daily before breakfast.   lidocaine 4 % cream Commonly known as: LMX Apply 1 Application topically once a week. Apply to fistula  prior to Dialysis- Tue, Thurs, Sat   melatonin 3 MG Tabs tablet Take 3 mg by mouth at bedtime.   midodrine 10 MG tablet Commonly known as: PROAMATINE Take 1 tablet (10 mg total) by mouth Every Tuesday,Thursday,and Saturday with dialysis. Start taking on: July 03, 2022   oxyCODONE 5 MG immediate release tablet Commonly known as: Oxy IR/ROXICODONE Take 1 tablet (5 mg total) by mouth every 4 (four) hours as needed for moderate pain.   pantoprazole 40 MG tablet Commonly known as: PROTONIX Take 40 mg by mouth 2 (two) times daily. For GERD   rosuvastatin 10 MG tablet Commonly known as: CRESTOR Take 10 mg by mouth at bedtime.       Allergies  Allergen Reactions   Gabapentin     Cognitive impairment per spouse   Lipitor [Atorvastatin]     Severe muscle cramps per spouse   Pregabalin     Cognitive impairment. Dizziness.    Follow-up Information     [facility] Follow up in 1 month(s).   Why: The office will call the patient with an appointment Contact information: [address] [place] [address] [phone]        MD AT SNF Follow up.          Gomez Cleverly, MD. Schedule an appointment as soon as possible for a visit in 2 week(s).   Specialty: Orthopedic Surgery Why: Follow-up in 1 to 2 weeks. Contact information: [address] [place] [address]  [phone]         Hemodialysis Follow up on 07/03/2022.                   The results of significant diagnostics from this hospitalization (including imaging, microbiology, ancillary and laboratory) are listed below for reference.    Significant Diagnostic Studies: PERIPHERAL VASCULAR CATHETERIZATION  Result Date: 06/25/2022 Images from the original result were not included.   Patient name: Chase [name]     MRN: [id number]        DOB: Aug 13, 1950          Sex: male  06/25/2022 Pre-operative Diagnosis: Critical limb ischemia of the right upper extremity with tissue loss with gangrene of the fingertips Post-operative diagnosis:  Same Surgeon:  Cephus Shelling, MD Procedure Performed: 1.  Ultrasound-guided access right common femoral artery 2.  Limited arch aortogram 3.  Catheter selection of right axillary artery with dedicated right upper extremity arteriogram and catheter selection of right radial artery 4.  Right radial artery angioplasty (2.5 mm x 80 mm Sterling) for high grade 90% stenosis 5.  Mynx closure of the right common femoral artery 6.  35 minutes of monitored moderate conscious sedation time  Indications: 72 year old male with end-stage renal disease that vascular surgery was consulted for ischemic changes to the right fingertips on Monday night.  He presents for arch aortogram with right upper extremity arteriogram and possible intervention after risks benefits discussed.  Findings:  Ultrasound-guided access right common  femoral artery.  Arch aortogram showed patent arch vessels including innominate and right subclavian artery.  We then cannulated the innominate with a headhunter catheter and got a wire out into the right axillary artery and advanced our catheter into the right axillary artery.  Injections of the right upper extremity showed widely patent brachial artery.  He has significant disease below the elbow.  The radial artery was heavily calcified and tortuous and had a high-grade 90% stenosis in the proximal vessel.  There was also a second approximate 50% stenosis at the wrist.  Interosseous was patent.  The ulnar artery was diffusely diseased calcified and small and occluded at the wrist with no filling of the palmar arch.  Significant small vessel disease in the hand with no digital vessels identified.  From right transfemoral access, I was able to angioplasty the proximal right radial artery stenosis with a 2.5 mm x 80 mm Sterling with excellent results and widely patent proximal vessel.  We gave nitroglycerin throughout the case.  Tried to get down to the radial artery disease at the wrist into the hand and we did not have any balloon shaft long enough to reach lesion.  Brisk right radial artery signal at completion with preserved runoff into hand.  Procedure:  The patient was identified in the holding area and taken to room 8.  The patient was then placed supine on the table and prepped and draped in the usual sterile fashion.  A time out was called.  Ultrasound was used to evaluate the right common femoral artery.  It was patent .  A digital ultrasound image was acquired.  A micropuncture needle was used to access the right common femoral artery under ultrasound guidance.  An 018 wire was advanced without resistance and a micropuncture sheath was placed.  The 018 wire was removed and a benson wire was placed.  Pigtail catheter was advanced into the ascending aorta and an arch aortogram was given.  Prior to this we  did give  5000 units of IV heparin.  After the arch aortogram used a headhunter catheter with a Glidewire advantage to cannulate the innominate I got my wire out through the subclavian and advanced the catheter into the right axillary artery.  We then gave nitro and got dedicated right upper extremity angiogram images down to the hand.  Pertinent findings are noted above.  He has significant disease distal to the brachial artery in the forearm and hand.  Ultimately I did not think intervention on the ulnar artery was an option as this essentially occluded at the wrist with no filling of the palmar arch. I was able to get into the radial artery with a V18 and quick cross catheter in the proximal vessel and got dedicated hand injection imaging.  I then performed angioplasty of the proximal vessel with a 2.5 mm x 80 mm Sterling to nominal pressure for 2 minutes for the high-grade stenosis.  I was able to get my wire distally across the wrist for the second lesion but the balloon would not reach the lesion and this was the longest shaft length we had.  Essentially wires and catheters were removed and we put a short 5 French sheath right groin with a mynx closure device  Findings: Good results after angioplasty of high-grade proximal right radial stenosis at this is dominant runoff into the hand.  He still has significant small vessel disease in the hand and ulnar artery occludes at the wrist and is diminutive and diseased.  Aspirin statin for now.  Will start Plavix when okay from hand surgery.  Cephus Shelling, MD [facility] [place] [facility] Office: [phone]   VAS [place] UPPER EXTREMITY ARTERIAL DUPLEX  Result Date: 06/04/2022  UPPER EXTREMITY DUPLEX STUDY Patient Name:  Chase [name]  Date of Exam:   06/04/2022 Medical Rec #: [id number]      Accession #:    [id number] Date of Birth: 1950-12-09      Patient Gender: M Patient Age:   76 years Exam Location:  [facility] Procedure:      VAS  [place] UPPER EXTREMITY ARTERIAL DUPLEX Referring Phys: Theron Arista MESSICK --------------------------------------------------------------------------------  Indications: Bruising and pain of RUE digits. History:     Patient has a history of Pain in fingertips (patient gets blood              glucose check in right hand).  Risk Factors:  Hypertension, Diabetes, no history of smoking. Other Factors: ESRD (HD LUE) Limitations: Vessel calcification, patient movement, irregular heart rhythm Comparison Study: No previous exams Performing Technologist: Jody Hill RVT, RDMS  Examination Guidelines: A complete evaluation includes B-mode imaging, spectral Doppler, color Doppler, and power Doppler as needed of all accessible portions of each vessel. Bilateral testing is considered an integral part of a complete examination. Limited examinations for reoccurring indications may be performed as noted.  Right Doppler Findings: +---------------+----------+----------+-------------+--------+ Site           PSV (cm/s)Waveform  Stenosis     Comments +---------------+----------+----------+-------------+--------+ Subclavian Prox103       triphasic                       +---------------+----------+----------+-------------+--------+ Subclavian Mid 80        triphasic                       +---------------+----------+----------+-------------+--------+ Subclavian Dist          monophasic                      +---------------+----------+----------+-------------+--------+  Axillary       74        triphasic                       +---------------+----------+----------+-------------+--------+ Brachial Prox  55        triphasic                       +---------------+----------+----------+-------------+--------+ Brachial Mid   126       triphasic                       +---------------+----------+----------+-------------+--------+ Brachial Dist  69        triphasic                        +---------------+----------+----------+-------------+--------+ Radial Prox    149       biphasic  <50% stenosis         +---------------+----------+----------+-------------+--------+ Radial Mid     85        monophasic                      +---------------+----------+----------+-------------+--------+ Radial Dist    32        monophasic                      +---------------+----------+----------+-------------+--------+ Ulnar Prox     74        triphasic                       +---------------+----------+----------+-------------+--------+ Ulnar Mid      129       triphasic <50% stenosis         +---------------+----------+----------+-------------+--------+ Ulnar Dist     24        monophasic                      +---------------+----------+----------+-------------+--------+ Palmar Arch    59        biphasic                        +---------------+----------+----------+-------------+--------+ Arterial system patent, ther is a <50% stenosis in the ulnar and radial arteries.   Summary:  Right: <50% stenosis visualized in the right upper extremity. *See table(s) above for measurements and observations. Electronically signed by Coral Else MD on 06/04/2022 at 4:52:08 PM.    Final    DG Hand Complete Right  Result Date: 06/04/2022 CLINICAL DATA:  Pain and swelling EXAM: RIGHT HAND - COMPLETE 3+ VIEW COMPARISON:  None Available. FINDINGS: No fracture or dislocation is seen. Degenerative changes are noted with bony spurs in multiple interphalangeal joints. Degenerative changes are noted in first carpometacarpal and first metacarpophalangeal joints. No focal lytic lesions are seen. Extensive arterial calcifications are noted extending to the digital arterial branches in the fingers. IMPRESSION: No fracture or dislocation is seen. No focal lytic lesions are noted. Degenerative changes are noted in multiple joints. Extensive arterial calcifications are noted. Electronically Signed    By: Ernie Avena M.D.   On: 06/04/2022 13:52    Microbiology: Recent Results (from the past 240 hour(s))  Surgical PCR screen     Status: None   Collection Time: 06/23/22  9:44 PM   Specimen: Nasal Mucosa; Nasal Swab  Result Value Ref Range Status   MRSA, PCR NEGATIVE NEGATIVE Final   Staphylococcus  aureus NEGATIVE NEGATIVE Final    Comment: (NOTE) The Xpert SA Assay (FDA approved for NASAL specimens in patients 7 years of age and older), is one component of a comprehensive surveillance program. It is not intended to diagnose infection nor to guide or monitor treatment. Performed at [facility], [address] [place], [place] [address]   Urine Culture (for pregnant, neutropenic or urologic patients or patients with an indwelling urinary catheter)     Status: None   Collection Time: 06/27/22  8:07 AM   Specimen: Urine, Clean Catch  Result Value Ref Range Status   Specimen Description URINE, CLEAN CATCH  Final   Special Requests NONE  Final   Culture   Final    NO GROWTH Performed at [facility], [address], [place], [place] [address]    Report Status 06/28/2022 FINAL  Final     Labs: Basic Metabolic Panel: Recent Labs  Lab 06/26/22 0127 06/27/22 0805 06/28/22 0130 06/29/22 0123 06/30/22 0115 07/01/22 0901  NA 137 134* 133* 133* 132* 131*  K 4.2 4.3 3.4* 3.4* 4.1 5.0  CL 97* 96* 95* 96* 95* 96*  CO2 27 24 27 29 27 24   GLUCOSE 84 106* 91 110* 97 147*  BUN 32* 46* 20 11 20  31*  CREATININE 4.13* 5.63* 3.41* 2.40* 3.77* 5.07*  CALCIUM 8.3* 8.5* 8.7* 8.7* 9.1 8.2*  MG 1.4* 1.6* 1.4* 1.4* 1.4* 1.3*  PHOS 4.4 6.0* 3.5 2.5  --  6.1*   Liver Function Tests: Recent Labs  Lab 06/25/22 0259 06/26/22 0127 06/27/22 0805 06/28/22 0130 06/29/22 0123 07/01/22 0901  AST 19  --   --   --   --   --   ALT 11  --   --   --   --   --   ALKPHOS 113  --   --   --   --   --   BILITOT 0.3  --   --   --   --   --   PROT 5.8*  --   --   --   --   --    ALBUMIN 2.3* 2.1* 2.3* 2.3* 2.4* 2.2*   No results for input(s): "LIPASE", "AMYLASE" in the last 168 hours. No results for input(s): "AMMONIA" in the last 168 hours. CBC: Recent Labs  Lab 06/25/22 0259 06/26/22 0127 06/26/22 1913 06/27/22 0805 06/28/22 0130 06/29/22 0123 06/30/22 0115 07/01/22 0901  WBC 7.0 5.7  --  7.1 6.5  --  7.6 10.5  NEUTROABS 5.1 3.8  --  5.2  --   --   --   --   HGB 7.3* 7.0*   < > 7.8* 8.0* 8.5* 8.8* 7.4*  HCT 23.9* 23.1*   < > 25.7* 25.7* 27.3* 28.7* 24.2*  MCV 99.2 100.0  --  97.7 97.0  --  98.6 101.7*  PLT 188 163  --  180 162  --  183 209   < > = values in this interval not displayed.   Cardiac Enzymes: No results for input(s): "CKTOTAL", "CKMB", "CKMBINDEX", "TROPONINI" in the last 168 hours. BNP: BNP (last 3 results) No results for input(s): "BNP" in the last 8760 hours.  ProBNP (last 3 results) No results for input(s): "PROBNP" in the last 8760 hours.  CBG: Recent Labs  Lab 06/30/22 1630 06/30/22 1649 06/30/22 1914 06/30/22 2019  GLUCAP 67* 129* 89 90       Signed:  Ramiro Harvest MD.  [facility] 07/01/2022, 2:56 PM

## 2022-07-02 LAB — TYPE AND SCREEN
ABO/RH(D): O POS
Unit division: 0

## 2022-07-02 LAB — BPAM RBC: ISSUE DATE / TIME: 202406251535

## 2022-07-02 NOTE — Discharge Summary (Signed)
Physician Discharge Summary  Chase [name] ZOX:[id number] DOB: [birth year] DOA: 06/23/2022  PCP: [name], MD  Admit date: 06/23/2022 Discharge date: 07/02/2022  Time spent: 60 minutes  Recommendations for Outpatient Follow-up:  Follow-up with vascular surgery in 1 month. Follow-up with MD at SNF. Follow-up with Dr. Yehuda Budd, hand surgeon in 1 to 2 weeks. Follow-up with regular hemodialysis unit on 07/03/2022.   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  Diet recommendation: Heart healthy just like  Filed Weights   06/27/22 1254 06/28/22 1811 07/01/22 0802  Weight: 90.1 kg 88.5 kg 90.5 kg    Per Dr. Janee Morn:    History of present illness:  HPI per Dr. Oneal Deputy is a 72 y.o. male with medical history significant for ESRD on Tuesday Thursday Saturday dialysis, neck pain, non-insulin-dependent type 2 diabetes, hypertension, hypothyroidism being admitted to the hospital with dry gangrene of the right hand.  Patient was admitted to [facility] 5/1 to 5/7 due to uremia, at which time he was transitioned from PD to HD.  He was discharged from that hospital stay to nursing home, where he has overall been doing well, per the patient and his wife.  He started having pain in the right hand about 3 weeks ago.  He was seen in this ER on 5/29 with complaints of right fingertip pain, at that time he had good capillary refill, images were reviewed from that date.  He also had arterial Doppler on that day without evidence of significant stenosis or impaired blood flow.  He was discharged back  to his nursing facility that date, but since that time pain has continued and worsened, he has developed dark discoloration of the ring finger of the right hand.   ED Course: Upon evaluation in the ER, he is moderately hypertensive, but vital signs otherwise stable.  Lab work was done and without any acute abnormality, relatively stable at baseline.  He is due for hemodialysis tomorrow, he is on a Tuesday Thursday Saturday schedule.  Hospital Course:  #1 dry gangrene of the right ring finger and ischemic right ring finger -Patient admitted, seen in consultation by vascular surgery and hand surgeon. -Patient s/p arch aortogram with right upper extremity arteriogram and right radial artery angioplasty on 06/25/2022 per Dr. Chestine Spore.   -Per vascular surgery patient optimized and recommending continuation of aspirin and statin and starting Plavix when okay from hand surgery point of view.   -Discussed with PA, Earney Hamburg, covering for orthopedics/hand surgery who recommended resumption of Plavix with outpatient follow-up with Dr. Yehuda Budd, hand surgeon in the next 1 to 2 weeks for further management and to allow finger to demarcate..   -Patient subsequently placed on Plavix 75 mg daily.   -Outpatient follow-up with vascular surgery in 1 month for arterial duplex of right upper extremity.   2.  ESRD on HD TTS -Patient seen in consultation by nephrology, underwent hemodialysis on 06/25/2022, 06/27/2022 per nephrology and then back to regular schedule.   -PD catheter removed 06/30/2022 per vascular surgery.  -Patient underwent hemodialysis on his regular scheduled on 07/01/2022 on  day of discharge.  -Per nephrology.   3.  Chronic back and neck pain -Patient was maintained on home regimen buprenorphine 2 mg SL at bedtime, oxycodone 5 mg p.o. every 4 hours as needed moderate pain and IV fentanyl 50 mcg every hour as needed moderate pain. -Pain was controlled during the hospitalization. -Outpatient  follow-up.   4.  Hypertension/transient hypotension in HD -Remained stable.   -Home regimen Demadex initially held and subsequently resumed.   -Patient noted to have a transient bout of hypotension hemodialysis on 07/01/2022 and blood pressure returned back to low normal.   -Demadex discontinued.   -Patient to receive a unit PRBCs on day of discharge.   -Patient placed back on midodrine 10 mg with hemodialysis on TTS. -Outpatient follow-up.    5.  Hypothyroidism -Patient maintained on home regimen Synthroid.    6.  Depression/anxiety -Patient maintained on home regimen Lexapro, BuSpar.     7.  Anemia of chronic kidney disease -Hemoglobin noted at 7.4 on day of discharge from 8.8 from 8.5 from 8.0 from 7.8 from 7.0 from 8.5 on admission.  -Patient with no overt bleeding noted. -Status post transfusion 1 unit PRBCs 06/26/2022 -Anemia panel with iron level of 19, TIBC of 164, ferritin of 793, folate of 11.8. -Ferrlecit iron load ordered per nephrology, aranesp in HD. -Patient to be transfused 1 unit PRBCs prior to discharge on 07/01/2022. -Outpatient follow-up in hemodialysis for repeat labs.   8.  GERD -Patient maintained on PPI.    9.  Obesity -BMI 33.52 kg/m. -Lifestyle modification -Outpatient follow-up with PCP.   10.  Hyponatremia -On HD. -Stable.   11.  Hypomagnesemia -Magnesium of 1.4. -Replacement was deferred to nephrology.   -Outpatient follow-up.     Procedures: Transfuse 1 unit PRBCs 06/26/2022 PD catheter removal 06/30/2022 Transfused 1 unit PRBCs 07/01/2022 1.  Ultrasound-guided access right common femoral artery 2.  Limited arch aortogram 3.  Catheter selection of right axillary artery with dedicated right upper extremity arteriogram and catheter selection of right radial artery 4.  Right radial artery angioplasty (2.5 mm x 80 mm Sterling) for high grade 90% stenosis 5.  Mynx closure of the right common femoral artery 6.  35 minutes of monitored moderate  conscious sedation time--per vascular surgery: Dr. Chestine Spore 06/25/2022  Consultations: Hand surgeon/orthopedics: Dr. Yehuda Budd 06/24/2022 Vascular surgery: Dr. Chestine Spore 06/23/2022 Nephrology: Dr. Signe Colt 06/24/2022  Discharge Exam: Vitals:   07/02/22 0335 07/02/22 0735  BP: (!) 93/51 (!) 95/58  Pulse:  65  Resp: 15 14  Temp: 98.1 F (36.7 C) 98.6 F (37 C)  SpO2: 98% (!) 89%      Discharge Instructions   Discharge Instructions     Diet - low sodium heart healthy   Complete by: As directed    Increase activity slowly   Complete by: As directed    No wound care   Complete by: As directed       Allergies as of 07/02/2022       Reactions   Gabapentin    Cognitive impairment per spouse   Lipitor [atorvastatin]    Severe muscle cramps per spouse   Pregabalin    Cognitive impairment. Dizziness.        Medication List     STOP taking these medications    torsemide 20 MG tablet Commonly known as: DEMADEX       TAKE these medications    acetaminophen 500 MG tablet Commonly known as: TYLENOL Take 1,000 mg by mouth every  8 (eight) hours as needed for moderate pain or mild pain.   ADULT NUTRITIONAL SUPPLEMENT + PO Take 60 mLs by mouth 2 (two) times daily.   albuterol 108 (90 Base) MCG/ACT inhaler Commonly known as: VENTOLIN HFA Inhale 2 puffs into the lungs every 4 (four) hours as needed for wheezing or shortness of breath.   aspirin EC 81 MG tablet Take 1 tablet (81 mg total) by mouth daily. Swallow whole.   buprenorphine 2 MG Subl SL tablet Commonly known as: SUBUTEX Place 1 tablet (2 mg total) under the tongue at bedtime. What changed:  how much to take when to take this   busPIRone 10 MG tablet Commonly known as: BUSPAR Take 1-2 tablets (10-20 mg total) by mouth See admin instructions. Take 2 tablets in the AM and 1 tablet in the PM.   calcitRIOL 0.25 MCG capsule Commonly known as: ROCALTROL Take 0.25 mcg by mouth daily.   Calcium Acetate 667 MG  Tabs Take 1 tablet by mouth 3 (three) times daily.   clopidogrel 75 MG tablet Commonly known as: PLAVIX Take 1 tablet (75 mg total) by mouth daily.   Darbepoetin Alfa 100 MCG/0.5ML Sosy injection Commonly known as: ARANESP Inject 0.5 mLs (100 mcg total) into the skin every Thursday at 6pm. Start taking on: July 03, 2022   escitalopram 20 MG tablet Commonly known as: LEXAPRO Take 20 mg by mouth daily.   hydrOXYzine 10 MG tablet Commonly known as: ATARAX Take 1 tablet (10 mg total) by mouth 3 (three) times daily as needed for anxiety.   levothyroxine 125 MCG tablet Commonly known as: SYNTHROID Take 250 mcg by mouth daily before breakfast.   lidocaine 4 % cream Commonly known as: LMX Apply 1 Application topically once a week. Apply to fistula  prior to Dialysis- Tue, Thurs, Sat   melatonin 3 MG Tabs tablet Take 3 mg by mouth at bedtime.   midodrine 10 MG tablet Commonly known as: PROAMATINE Take 1 tablet (10 mg total) by mouth Every Tuesday,Thursday,and Saturday with dialysis. Start taking on: July 03, 2022   oxyCODONE 5 MG immediate release tablet Commonly known as: Oxy IR/ROXICODONE Take 1 tablet (5 mg total) by mouth every 4 (four) hours as needed for moderate pain.   pantoprazole 40 MG tablet Commonly known as: PROTONIX Take 40 mg by mouth 2 (two) times daily. For GERD   rosuvastatin 10 MG tablet Commonly known as: CRESTOR Take 10 mg by mouth at bedtime.       Allergies  Allergen Reactions   Gabapentin     Cognitive impairment per spouse   Lipitor [Atorvastatin]     Severe muscle cramps per spouse   Pregabalin     Cognitive impairment. Dizziness.    Follow-up Information     [facility] Follow up in 1 month(s).   Why: The office will call the patient with an appointment Contact information: [address] [place] [address] [phone]        MD AT SNF Follow up.          Gomez Cleverly, MD. Schedule an  appointment as soon as possible for a visit in 2 week(s).   Specialty: Orthopedic Surgery Why: Follow-up in 1 to 2 weeks. Contact information: [address] [place] [address] [phone]         Hemodialysis Follow up on 07/03/2022.  The results of significant diagnostics from this hospitalization (including imaging, microbiology, ancillary and laboratory) are listed below for reference.    Significant Diagnostic Studies: PERIPHERAL VASCULAR CATHETERIZATION  Result Date: 06/25/2022 Images from the original result were not included.   Patient name: Chase [name]     MRN: [id number]        DOB: June 30, 1950          Sex: male  06/25/2022 Pre-operative Diagnosis: Critical limb ischemia of the right upper extremity with tissue loss with gangrene of the fingertips Post-operative diagnosis:  Same Surgeon:  Cephus Shelling, MD Procedure Performed: 1.  Ultrasound-guided access right common femoral artery 2.  Limited arch aortogram 3.  Catheter selection of right axillary artery with dedicated right upper extremity arteriogram and catheter selection of right radial artery 4.  Right radial artery angioplasty (2.5 mm x 80 mm Sterling) for high grade 90% stenosis 5.  Mynx closure of the right common femoral artery 6.  35 minutes of monitored moderate conscious sedation time  Indications: 72 year old male with end-stage renal disease that vascular surgery was consulted for ischemic changes to the right fingertips on Monday night.  He presents for arch aortogram with right upper extremity arteriogram and possible intervention after risks benefits discussed.  Findings:  Ultrasound-guided access right common femoral artery.  Arch aortogram showed patent arch vessels including innominate and right subclavian artery.  We then cannulated the innominate with a headhunter catheter and got a wire out into the right axillary artery and advanced our catheter into the right  axillary artery.  Injections of the right upper extremity showed widely patent brachial artery.  He has significant disease below the elbow.  The radial artery was heavily calcified and tortuous and had a high-grade 90% stenosis in the proximal vessel.  There was also a second approximate 50% stenosis at the wrist.  Interosseous was patent.  The ulnar artery was diffusely diseased calcified and small and occluded at the wrist with no filling of the palmar arch.  Significant small vessel disease in the hand with no digital vessels identified.  From right transfemoral access, I was able to angioplasty the proximal right radial artery stenosis with a 2.5 mm x 80 mm Sterling with excellent results and widely patent proximal vessel.  We gave nitroglycerin throughout the case.  Tried to get down to the radial artery disease at the wrist into the hand and we did not have any balloon shaft long enough to reach lesion.  Brisk right radial artery signal at completion with preserved runoff into hand.  Procedure:  The patient was identified in the holding area and taken to room 8.  The patient was then placed supine on the table and prepped and draped in the usual sterile fashion.  A time out was called.  Ultrasound was used to evaluate the right common femoral artery.  It was patent .  A digital ultrasound image was acquired.  A micropuncture needle was used to access the right common femoral artery under ultrasound guidance.  An 018 wire was advanced without resistance and a micropuncture sheath was placed.  The 018 wire was removed and a benson wire was placed.  Pigtail catheter was advanced into the ascending aorta and an arch aortogram was given.  Prior to this we did give 5000 units of IV heparin.  After the arch aortogram used a headhunter catheter with a Glidewire advantage to cannulate the innominate I got my wire out through the subclavian and advanced the  catheter into the right axillary artery.  We then gave nitro  and got dedicated right upper extremity angiogram images down to the hand.  Pertinent findings are noted above.  He has significant disease distal to the brachial artery in the forearm and hand.  Ultimately I did not think intervention on the ulnar artery was an option as this essentially occluded at the wrist with no filling of the palmar arch. I was able to get into the radial artery with a V18 and quick cross catheter in the proximal vessel and got dedicated hand injection imaging.  I then performed angioplasty of the proximal vessel with a 2.5 mm x 80 mm Sterling to nominal pressure for 2 minutes for the high-grade stenosis.  I was able to get my wire distally across the wrist for the second lesion but the balloon would not reach the lesion and this was the longest shaft length we had.  Essentially wires and catheters were removed and we put a short 5 French sheath right groin with a mynx closure device  Findings: Good results after angioplasty of high-grade proximal right radial stenosis at this is dominant runoff into the hand.  He still has significant small vessel disease in the hand and ulnar artery occludes at the wrist and is diminutive and diseased.  Aspirin statin for now.  Will start Plavix when okay from hand surgery.  Cephus Shelling, MD [facility] Office: [phone]   VAS [place] UPPER EXTREMITY ARTERIAL DUPLEX  Result Date: 06/04/2022  UPPER EXTREMITY DUPLEX STUDY Patient Name:  Chase [name]  Date of Exam:   06/04/2022 Medical Rec #: [id number]      Accession #:    [id number] Date of Birth: [birth year]      Patient Gender: M Patient Age:   44 years Exam Location:  [facility] Procedure:      VAS [place] UPPER EXTREMITY ARTERIAL DUPLEX Referring Phys: Theron Arista MESSICK --------------------------------------------------------------------------------  Indications: Bruising and pain of RUE digits. History:     Patient has a history of Pain in fingertips (patient gets  blood              glucose check in right hand).  Risk Factors:  Hypertension, Diabetes, no history of smoking. Other Factors: ESRD (HD LUE) Limitations: Vessel calcification, patient movement, irregular heart rhythm Comparison Study: No previous exams Performing Technologist: Jody Hill RVT, RDMS  Examination Guidelines: A complete evaluation includes B-mode imaging, spectral Doppler, color Doppler, and power Doppler as needed of all accessible portions of each vessel. Bilateral testing is considered an integral part of a complete examination. Limited examinations for reoccurring indications may be performed as noted.  Right Doppler Findings: +---------------+----------+----------+-------------+--------+ Site           PSV (cm/s)Waveform  Stenosis     Comments +---------------+----------+----------+-------------+--------+ Subclavian Prox103       triphasic                       +---------------+----------+----------+-------------+--------+ Subclavian Mid 80        triphasic                       +---------------+----------+----------+-------------+--------+ Subclavian Dist          monophasic                      +---------------+----------+----------+-------------+--------+ Axillary       74  triphasic                       +---------------+----------+----------+-------------+--------+ Brachial Prox  55        triphasic                       +---------------+----------+----------+-------------+--------+ Brachial Mid   126       triphasic                       +---------------+----------+----------+-------------+--------+ Brachial Dist  69        triphasic                       +---------------+----------+----------+-------------+--------+ Radial Prox    149       biphasic  <50% stenosis         +---------------+----------+----------+-------------+--------+ Radial Mid     85        monophasic                       +---------------+----------+----------+-------------+--------+ Radial Dist    32        monophasic                      +---------------+----------+----------+-------------+--------+ Ulnar Prox     74        triphasic                       +---------------+----------+----------+-------------+--------+ Ulnar Mid      129       triphasic <50% stenosis         +---------------+----------+----------+-------------+--------+ Ulnar Dist     24        monophasic                      +---------------+----------+----------+-------------+--------+ Palmar Arch    59        biphasic                        +---------------+----------+----------+-------------+--------+ Arterial system patent, ther is a <50% stenosis in the ulnar and radial arteries.   Summary:  Right: <50% stenosis visualized in the right upper extremity. *See table(s) above for measurements and observations. Electronically signed by Coral Else MD on 06/04/2022 at 4:52:08 PM.    Final    DG Hand Complete Right  Result Date: 06/04/2022 CLINICAL DATA:  Pain and swelling EXAM: RIGHT HAND - COMPLETE 3+ VIEW COMPARISON:  None Available. FINDINGS: No fracture or dislocation is seen. Degenerative changes are noted with bony spurs in multiple interphalangeal joints. Degenerative changes are noted in first carpometacarpal and first metacarpophalangeal joints. No focal lytic lesions are seen. Extensive arterial calcifications are noted extending to the digital arterial branches in the fingers. IMPRESSION: No fracture or dislocation is seen. No focal lytic lesions are noted. Degenerative changes are noted in multiple joints. Extensive arterial calcifications are noted. Electronically Signed   By: Ernie Avena M.D.   On: 06/04/2022 13:52    Microbiology: Recent Results (from the past 240 hour(s))  Surgical PCR screen     Status: None   Collection Time: 06/23/22  9:44 PM   Specimen: Nasal Mucosa; Nasal Swab  Result Value Ref  Range Status   MRSA, PCR NEGATIVE NEGATIVE Final   Staphylococcus aureus NEGATIVE NEGATIVE Final    Comment: (NOTE) The Xpert SA Assay (FDA approved  for NASAL specimens in patients 50 years of age and older), is one component of a comprehensive surveillance program. It is not intended to diagnose infection nor to guide or monitor treatment. Performed at [facility], [address], [place], [place] [address]   Urine Culture (for pregnant, neutropenic or urologic patients or patients with an indwelling urinary catheter)     Status: None   Collection Time: 06/27/22  8:07 AM   Specimen: Urine, Clean Catch  Result Value Ref Range Status   Specimen Description URINE, CLEAN CATCH  Final   Special Requests NONE  Final   Culture   Final    NO GROWTH Performed at [facility], [address], [place], [place] [address]    Report Status 06/28/2022 FINAL  Final     Labs: Basic Metabolic Panel: Recent Labs  Lab 06/26/22 0127 06/27/22 0805 06/28/22 0130 06/29/22 0123 06/30/22 0115 07/01/22 0901  NA 137 134* 133* 133* 132* 131*  K 4.2 4.3 3.4* 3.4* 4.1 5.0  CL 97* 96* 95* 96* 95* 96*  CO2 27 24 27 29 27 24   GLUCOSE 84 106* 91 110* 97 147*  BUN 32* 46* 20 11 20  31*  CREATININE 4.13* 5.63* 3.41* 2.40* 3.77* 5.07*  CALCIUM 8.3* 8.5* 8.7* 8.7* 9.1 8.2*  MG 1.4* 1.6* 1.4* 1.4* 1.4* 1.3*  PHOS 4.4 6.0* 3.5 2.5  --  6.1*   Liver Function Tests: Recent Labs  Lab 06/26/22 0127 06/27/22 0805 06/28/22 0130 06/29/22 0123 07/01/22 0901  ALBUMIN 2.1* 2.3* 2.3* 2.4* 2.2*   No results for input(s): "LIPASE", "AMYLASE" in the last 168 hours. No results for input(s): "AMMONIA" in the last 168 hours. CBC: Recent Labs  Lab 06/26/22 0127 06/26/22 1913 06/27/22 0805 06/28/22 0130 06/29/22 0123 06/30/22 0115 07/01/22 0901  WBC 5.7  --  7.1 6.5  --  7.6 10.5  NEUTROABS 3.8  --  5.2  --   --   --   --   HGB 7.0*   < > 7.8* 8.0* 8.5* 8.8* 7.4*  HCT 23.1*   < > 25.7* 25.7*  27.3* 28.7* 24.2*  MCV 100.0  --  97.7 97.0  --  98.6 101.7*  PLT 163  --  180 162  --  183 209   < > = values in this interval not displayed.   Cardiac Enzymes: No results for input(s): "CKTOTAL", "CKMB", "CKMBINDEX", "TROPONINI" in the last 168 hours. BNP: BNP (last 3 results) No results for input(s): "BNP" in the last 8760 hours.  ProBNP (last 3 results) No results for input(s): "PROBNP" in the last 8760 hours.  CBG: Recent Labs  Lab 06/30/22 1630 06/30/22 1649 06/30/22 1914 06/30/22 2019  GLUCAP 67* 129* 89 90       Signed:  Selinda Orion Rand Boller DO.  [facility] 07/02/2022, 8:15 AM

## 2022-07-02 NOTE — Care Management Important Message (Signed)
Important Message  Patient Details  Name: Chase [name] MRN: [id number] Date of Birth: [birth year]   Medicare Important Message Given:  Yes     Renie Ora 07/02/2022, 10:30 AM

## 2022-07-02 NOTE — Progress Notes (Signed)
Attempted to call report multiple times. [phone]. No voicemail option.   Kenard Gower, RN

## 2022-07-02 NOTE — Progress Notes (Signed)
D/C order noted. Contacted [facility] and spoke to charge RN. Clinic advised pt will d/c to snf today and will resume care tomorrow. Clinic has access to [facility] Epic to obtain needed clinicals.   Olivia Canter Renal Navigator [phone]

## 2022-07-02 NOTE — Progress Notes (Signed)
Occupational Therapy Treatment Patient Details Name: Chase [name] MRN: [id number] DOB: [birth year] Today's Date: 07/02/2022   History of present illness 72 y.o. male presenting to [facility] 6/17 with dry gangrene of R hand s/p 6/19 arch aortogram with right upper extremity arteriogram. PMH includes back pain, depression, CAD, DM, CKD, HTN, SDH, ESRD   OT comments  Pt progressing towards goals, able to perform seated therex with increased time due to pain. Pt improving with ability to grasp objects, per pt/spouse pt self-fed breakfast with R hand this AM. Pt set up A for seated ADL and min A for transfers with RW. Pt presenting with impairments listed below, will follow acutely. Patient will benefit from continued inpatient follow up therapy, <3 hours/day to maximize safety/ind with ADLs/functional mobility.    Recommendations for follow up therapy are one component of a multi-disciplinary discharge planning process, led by the attending physician.  Recommendations may be updated based on patient status, additional functional criteria and insurance authorization.    Assistance Recommended at Discharge Intermittent Supervision/Assistance  Patient can return home with the following  A lot of help with walking and/or transfers;A lot of help with bathing/dressing/bathroom;Assistance with cooking/housework;Assist for transportation;Help with stairs or ramp for entrance   Equipment Recommendations  None recommended by OT    Recommendations for Other Services      Precautions / Restrictions Precautions Precautions: Fall Precaution Comments: fall hx Restrictions Weight Bearing Restrictions: No       Mobility Bed Mobility               General bed mobility comments: OOB in chair upon arrival and departure    Transfers Overall transfer level: Needs assistance Equipment used: Rolling walker (2 wheels) Transfers: Sit to/from Stand Sit to Stand: Min assist                 Balance  Overall balance assessment: Needs assistance Sitting-balance support: Feet supported Sitting balance-Leahy Scale: Good Sitting balance - Comments: Pt in recliner, sitting balance not fully assessed   Standing balance support: Bilateral upper extremity supported, During functional activity, Reliant on assistive device for balance Standing balance-Leahy Scale: Poor Standing balance comment: reliant on RW support                           ADL either performed or assessed with clinical judgement   ADL Overall ADL's : Needs assistance/impaired     Grooming: Set up;Sitting Grooming Details (indicate cue type and reason): washes face sitting up in chair                                    Extremity/Trunk Assessment Upper Extremity Assessment Upper Extremity Assessment: Generalized weakness RUE Deficits / Details: Pt reports pain with AROM, shoulder flex ~90 degrees 3/5 MMT, can touch face. R ring finger blackened/necrotic. R fingers limited to to ~40 degrees PIP flexion, able to make parital hook fist.Can oppose digits LUE Deficits / Details: Able to make full fist, shoulder flex about 90-95 degrees at baseline per pt report, can touch face, 3-/5 MMT shldr flex   Lower Extremity Assessment Lower Extremity Assessment: Defer to PT evaluation        Vision   Additional Comments: wears glasses at baseline, not present in room   Perception Perception Perception: Not tested   Praxis Praxis Praxis: Not tested    Cognition Arousal/Alertness: Awake/alert Behavior  During Therapy: Flat affect Overall Cognitive Status: Impaired/Different from baseline Area of Impairment: Problem solving                             Problem Solving: Requires verbal cues, Decreased initiation, Difficulty sequencing, Slow processing, Requires tactile cues General Comments: pt with slowed response to questions requiring cuing for sequencing        Exercises General  Exercises - Upper Extremity Shoulder Flexion: AROM, Right, 5 reps, Seated Elbow Flexion: AROM, Right, 5 reps, Seated Elbow Extension: AROM, Right, 5 reps, Seated Wrist Flexion: AROM, 5 reps, Right, Seated Wrist Extension: AROM, Right, 5 reps, Seated Digit Composite Flexion: AROM, Right, 5 reps, Seated Other Exercises Other Exercises: R isolated prone digit extension Other Exercises: RUE wash cloth squeeze x5 Other Exercises: RUE digit opposition    Shoulder Instructions       General Comments VSS on RA    Pertinent Vitals/ Pain       Pain Assessment Pain Assessment: Faces Pain Score: 6  Faces Pain Scale: Hurts even more Pain Location: R hand and forearm Pain Descriptors / Indicators: Discomfort, Grimacing, Guarding Pain Intervention(s): Limited activity within patient's tolerance, Monitored during session, Repositioned  Home Living                                          Prior Functioning/Environment              Frequency  Min 3X/week        Progress Toward Goals  OT Goals(current goals can now be found in the care plan section)  Progress towards OT goals: Progressing toward goals  Acute Rehab OT Goals Patient Stated Goal: none stated OT Goal Formulation: With patient Time For Goal Achievement: 07/10/22 Potential to Achieve Goals: Good ADL Goals Pt Will Perform Grooming: standing;with min guard assist Pt Will Perform Lower Body Dressing: sitting/lateral leans;with supervision;with set-up Pt Will Transfer to Toilet: with min guard assist;ambulating Pt/caregiver will Perform Home Exercise Program: Increased ROM;Right Upper extremity;With written HEP provided;Independently;Increased strength Additional ADL Goal #2: Pt will achieve 70 degrees PIP flexion in R hand in preparation for functional grasp activites  Plan Frequency remains appropriate;Discharge plan needs to be updated    Co-evaluation                 AM-PAC OT "6  Clicks" Daily Activity     Outcome Measure   Help from another person eating meals?: A Little Help from another person taking care of personal grooming?: A Little Help from another person toileting, which includes using toliet, bedpan, or urinal?: A Lot Help from another person bathing (including washing, rinsing, drying)?: A Lot Help from another person to put on and taking off regular upper body clothing?: A Little Help from another person to put on and taking off regular lower body clothing?: A Lot 6 Click Score: 15    End of Session Equipment Utilized During Treatment: Rolling walker (2 wheels);Gait belt  OT Visit Diagnosis: Unsteadiness on feet (R26.81);Other abnormalities of gait and mobility (R26.89);History of falling (Z91.81);Muscle weakness (generalized) (M62.81);Pain Pain - Right/Left: Right Pain - part of body: Hand   Activity Tolerance Patient tolerated treatment well   Patient Left in chair;with call bell/phone within reach;with family/visitor present   Nurse Communication Mobility status        Time:  8295-6213 OT Time Calculation (min): 35 min  Charges: OT General Charges $OT Visit: 1 Visit OT Treatments $Self Care/Home Management : 8-22 mins $Therapeutic Exercise: 8-22 mins  Carver Fila, OTD, OTR/L SecureChat Preferred Acute Rehab [phone]   Carver Fila Koonce 07/02/2022, 1:40 PM

## 2022-07-02 NOTE — TOC Transition Note (Signed)
Transition of Care [facility]) - CM/SW Discharge Note   Patient Details  Name: Chase [name] MRN: [id number] Date of Birth: 05/06/1950  Transition of Care [facility]) CM/SW Contact:  Eduard Roux, LCSW Phone Number: 07/02/2022, 10:14 AM   Clinical Narrative:     Patient will Discharge QM:VHQION Place Discharge Date: 07/02/2022 Family Notified: Spouse- LVM Transport By: Sharin Mons  Per MD patient is ready for discharge. RN, patient, and facility notified of discharge. Discharge Summary sent to facility. RN given number for [phone], Room 704-P. Ambulance transport requested for patient.   Clinical Social Worker signing off.  Antony Blackbird, MSW, LCSW Clinical Social Worker      Final next level of care: Skilled Nursing Facility Barriers to Discharge: Barriers Resolved   Patient Goals and CMS Choice      Discharge Placement                Patient chooses bed at: [facility] Patient to be transferred to facility by: [facility] Name of family member notified: spouse Patient and family notified of of transfer: 07/02/22  Discharge Plan and Services Additional resources added to the After Visit Summary for   In-house Referral: Clinical Social Work                                   Social Determinants of Health (SDOH) Interventions SDOH Screenings   Food Insecurity: No Food Insecurity (06/23/2022)  Housing: Low Risk  (06/23/2022)  Transportation Needs: No Transportation Needs (06/23/2022)  Utilities: Not At Risk (06/23/2022)  Tobacco Use: Low Risk  (07/01/2022)     Readmission Risk Interventions     No data to display

## 2022-07-02 NOTE — Progress Notes (Signed)
[facility] Progress Note   Assessment/ Plan:   Assessment/Plan: 1 R finger/ hand gangrene: [facility] followings/p angio with PTA to R radial artery. Hand following, allowing 4th finger to demarcate. Outpatient f/u with hand surgery, [facility].  2 ESRD: TTS normally.  Back on schedule. Next HD 6/27  3 Hypertension/ volume: UF as able, appears grossly euvolemic. Hypotensive event today. Previously on midodrine - will order 10 mg pre HD on TTS.  4. 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 Got another unit 6/25.  5. Metabolic Bone Disease: Continue binders/vitamins 6.  PD catheter- removed in OR Monday. Appreciate VVS  7.  Dispo: ok for dc today. On midodrine 10mg  TTS pre HD  Tomasa Blase PA-C [facility] 07/02/2022,9:59 AM    Subjective:    Seen in room. Eating breakfast, no complaints. For discharge today    Objective:   BP (!) 102/46 (BP Location: Right Leg)   Pulse 65   Temp 98.6 F (37 C) (Oral)   Resp 14   Ht 5\' 6"  (1.676 m)   Wt 90.5 kg   SpO2 100%   BMI 32.20 kg/m   Physical Exam: GEN NAD, lying in bed PULM clear CV RRR ABD soft EXT no LE edema, R hand fingers demarcating NEURO slow to respond ACCESS: L RC AVF + T/B, PD catheter inplace  Labs: BMET Recent Labs  Lab 06/26/22 0127 06/27/22 0805 06/28/22 0130 06/29/22 0123 06/30/22 0115 07/01/22 0901  NA 137 134* 133* 133* 132* 131*  K 4.2 4.3 3.4* 3.4* 4.1 5.0  CL 97* 96* 95* 96* 95* 96*  CO2 27 24 27 29 27 24   GLUCOSE 84 106* 91 110* 97 147*  BUN 32* 46* 20 11 20  31*  CREATININE 4.13* 5.63* 3.41* 2.40* 3.77* 5.07*  CALCIUM 8.3* 8.5* 8.7* 8.7* 9.1 8.2*  PHOS 4.4 6.0* 3.5 2.5  --  6.1*    CBC Recent Labs  Lab 06/26/22 0127 06/26/22 1913 06/27/22 0805 06/28/22 0130 06/29/22 0123 06/30/22 0115 07/01/22 0901  WBC 5.7  --  7.1 6.5  --  7.6 10.5  NEUTROABS 3.8  --  5.2  --   --   --   --   HGB 7.0*   < > 7.8* 8.0* 8.5* 8.8*  7.4*  HCT 23.1*   < > 25.7* 25.7* 27.3* 28.7* 24.2*  MCV 100.0  --  97.7 97.0  --  98.6 101.7*  PLT 163  --  180 162  --  183 209   < > = values in this interval not displayed.       Medications:     aspirin EC  81 mg Oral Daily   buprenorphine  2 mg Sublingual QHS   busPIRone  10 mg Oral BID   calcitRIOL  0.25 mcg Oral Daily   calcium acetate  667 mg Oral TID WC   Chlorhexidine Gluconate Cloth  6 each Topical Q0600   clopidogrel  75 mg Oral Daily   darbepoetin (ARANESP) injection - DIALYSIS  100 mcg Subcutaneous Q Thu-1800   escitalopram  20 mg Oral Daily   heparin injection (subcutaneous)  5,000 Units Subcutaneous Q8H   levothyroxine  250 mcg Oral QAC breakfast   melatonin  5 mg Oral QHS   midodrine  10 mg Oral Q T,Th,Sa-HD   pantoprazole  40 mg Oral Daily   rosuvastatin  10 mg Oral QHS   sodium chloride flush  3  mL Intravenous Q12H   torsemide  20 mg Oral Daily

## 2022-07-11 ENCOUNTER — Other Ambulatory Visit: Payer: Self-pay | Admitting: *Deleted

## 2022-07-11 DIAGNOSIS — M79601 Pain in right arm: Secondary | ICD-10-CM

## 2022-07-15 ENCOUNTER — Inpatient Hospital Stay (HOSPITAL_COMMUNITY)
Admission: EM | Admit: 2022-07-15 | Discharge: 2022-07-18 | DRG: 255 | Disposition: A | Discharge status: Skilled Nursing Facility | Payer: Medicare Other | Attending: Family Medicine | Admitting: Family Medicine

## 2022-07-15 ENCOUNTER — Emergency Department (HOSPITAL_COMMUNITY): Payer: Medicare Other

## 2022-07-15 ENCOUNTER — Other Ambulatory Visit: Payer: Self-pay

## 2022-07-15 ENCOUNTER — Encounter (HOSPITAL_COMMUNITY): Payer: Self-pay

## 2022-07-15 DIAGNOSIS — R4182 Altered mental status, unspecified: Principal | ICD-10-CM

## 2022-07-15 DIAGNOSIS — K219 Gastro-esophageal reflux disease without esophagitis: Secondary | ICD-10-CM | POA: Diagnosis present

## 2022-07-15 DIAGNOSIS — F32A Depression, unspecified: Secondary | ICD-10-CM | POA: Diagnosis present

## 2022-07-15 DIAGNOSIS — Z7982 Long term (current) use of aspirin: Secondary | ICD-10-CM

## 2022-07-15 DIAGNOSIS — N3090 Cystitis, unspecified without hematuria: Secondary | ICD-10-CM

## 2022-07-15 DIAGNOSIS — Z66 Do not resuscitate: Secondary | ICD-10-CM | POA: Diagnosis present

## 2022-07-15 DIAGNOSIS — D631 Anemia in chronic kidney disease: Secondary | ICD-10-CM | POA: Diagnosis present

## 2022-07-15 DIAGNOSIS — Z7902 Long term (current) use of antithrombotics/antiplatelets: Secondary | ICD-10-CM

## 2022-07-15 DIAGNOSIS — Z79899 Other long term (current) drug therapy: Secondary | ICD-10-CM

## 2022-07-15 DIAGNOSIS — E1122 Type 2 diabetes mellitus with diabetic chronic kidney disease: Secondary | ICD-10-CM | POA: Diagnosis present

## 2022-07-15 DIAGNOSIS — E1152 Type 2 diabetes mellitus with diabetic peripheral angiopathy with gangrene: Principal | ICD-10-CM | POA: Diagnosis present

## 2022-07-15 DIAGNOSIS — K661 Hemoperitoneum: Secondary | ICD-10-CM

## 2022-07-15 DIAGNOSIS — I2510 Atherosclerotic heart disease of native coronary artery without angina pectoris: Secondary | ICD-10-CM | POA: Diagnosis present

## 2022-07-15 DIAGNOSIS — R911 Solitary pulmonary nodule: Secondary | ICD-10-CM

## 2022-07-15 DIAGNOSIS — I96 Gangrene, not elsewhere classified: Secondary | ICD-10-CM | POA: Diagnosis present

## 2022-07-15 DIAGNOSIS — G9341 Metabolic encephalopathy: Secondary | ICD-10-CM

## 2022-07-15 DIAGNOSIS — I120 Hypertensive chronic kidney disease with stage 5 chronic kidney disease or end stage renal disease: Secondary | ICD-10-CM | POA: Diagnosis present

## 2022-07-15 DIAGNOSIS — Z992 Dependence on renal dialysis: Secondary | ICD-10-CM

## 2022-07-15 DIAGNOSIS — Z79890 Hormone replacement therapy (postmenopausal): Secondary | ICD-10-CM

## 2022-07-15 DIAGNOSIS — J90 Pleural effusion, not elsewhere classified: Secondary | ICD-10-CM | POA: Diagnosis present

## 2022-07-15 DIAGNOSIS — Z888 Allergy status to other drugs, medicaments and biological substances status: Secondary | ICD-10-CM

## 2022-07-15 DIAGNOSIS — N186 End stage renal disease: Secondary | ICD-10-CM

## 2022-07-15 DIAGNOSIS — M542 Cervicalgia: Secondary | ICD-10-CM | POA: Diagnosis present

## 2022-07-15 DIAGNOSIS — Z9862 Peripheral vascular angioplasty status: Secondary | ICD-10-CM

## 2022-07-15 DIAGNOSIS — R7989 Other specified abnormal findings of blood chemistry: Secondary | ICD-10-CM

## 2022-07-15 DIAGNOSIS — L7632 Postprocedural hematoma of skin and subcutaneous tissue following other procedure: Secondary | ICD-10-CM | POA: Diagnosis present

## 2022-07-15 DIAGNOSIS — E039 Hypothyroidism, unspecified: Secondary | ICD-10-CM | POA: Diagnosis present

## 2022-07-15 DIAGNOSIS — F419 Anxiety disorder, unspecified: Secondary | ICD-10-CM | POA: Diagnosis present

## 2022-07-15 DIAGNOSIS — G894 Chronic pain syndrome: Secondary | ICD-10-CM | POA: Diagnosis present

## 2022-07-15 DIAGNOSIS — M898X9 Other specified disorders of bone, unspecified site: Secondary | ICD-10-CM | POA: Diagnosis present

## 2022-07-15 DIAGNOSIS — J9811 Atelectasis: Secondary | ICD-10-CM | POA: Diagnosis present

## 2022-07-15 DIAGNOSIS — E119 Type 2 diabetes mellitus without complications: Secondary | ICD-10-CM

## 2022-07-15 DIAGNOSIS — I953 Hypotension of hemodialysis: Secondary | ICD-10-CM | POA: Diagnosis present

## 2022-07-15 HISTORY — DX: Anemia, unspecified: D64.9

## 2022-07-15 LAB — COMPREHENSIVE METABOLIC PANEL
ALT: 6 U/L (ref 0–44)
AST: 23 U/L (ref 15–41)
Albumin: 2.5 g/dL — ABNORMAL LOW (ref 3.5–5.0)
Alkaline Phosphatase: 112 U/L (ref 38–126)
Anion gap: 14 (ref 5–15)
BUN: 16 mg/dL (ref 8–23)
CO2: 25 mmol/L (ref 22–32)
Calcium: 8.7 mg/dL — ABNORMAL LOW (ref 8.9–10.3)
Chloride: 100 mmol/L (ref 98–111)
Creatinine, Ser: 2.91 mg/dL — ABNORMAL HIGH (ref 0.61–1.24)
GFR, Estimated: 22 mL/min — ABNORMAL LOW (ref >60)
Glucose, Bld: 100 mg/dL — ABNORMAL HIGH (ref 70–99)
Potassium: 4.8 mmol/L (ref 3.5–5.1)
Sodium: 139 mmol/L (ref 135–145)
Total Bilirubin: 1.1 mg/dL (ref 0.3–1.2)
Total Protein: 6.3 g/dL — ABNORMAL LOW (ref 6.5–8.1)

## 2022-07-15 LAB — CBC WITH DIFFERENTIAL/PLATELET
Abs Immature Granulocytes: 0.04 10*3/uL (ref 0.00–0.07)
Basophils Absolute: 0.1 10*3/uL (ref 0.0–0.1)
Basophils Relative: 1 %
Eosinophils Absolute: 0.3 10*3/uL (ref 0.0–0.5)
Eosinophils Relative: 3 %
HCT: 30.6 % — ABNORMAL LOW (ref 39.0–52.0)
Hemoglobin: 9.5 g/dL — ABNORMAL LOW (ref 13.0–17.0)
Immature Granulocytes: 1 %
Lymphocytes Relative: 14 %
Lymphs Abs: 1.1 10*3/uL (ref 0.7–4.0)
MCH: 30.8 pg (ref 26.0–34.0)
MCHC: 31 g/dL (ref 30.0–36.0)
MCV: 99.4 fL (ref 80.0–100.0)
Monocytes Absolute: 0.7 10*3/uL (ref 0.1–1.0)
Monocytes Relative: 9 %
Neutro Abs: 5.7 10*3/uL (ref 1.7–7.7)
Neutrophils Relative %: 72 %
Platelets: 185 10*3/uL (ref 150–400)
RBC: 3.08 MIL/uL — ABNORMAL LOW (ref 4.22–5.81)
RDW: 15.1 % (ref 11.5–15.5)
WBC: 7.9 10*3/uL (ref 4.0–10.5)
nRBC: 0 % (ref 0.0–0.2)

## 2022-07-15 LAB — I-STAT CHEM 8, ED
BUN: 20 mg/dL (ref 8–23)
Calcium, Ion: 1.07 mmol/L — ABNORMAL LOW (ref 1.15–1.40)
Chloride: 100 mmol/L (ref 98–111)
Creatinine, Ser: 3.2 mg/dL — ABNORMAL HIGH (ref 0.61–1.24)
Glucose, Bld: 97 mg/dL (ref 70–99)
HCT: 30 % — ABNORMAL LOW (ref 39.0–52.0)
Hemoglobin: 10.2 g/dL — ABNORMAL LOW (ref 13.0–17.0)
Potassium: 4.7 mmol/L (ref 3.5–5.1)
Sodium: 137 mmol/L (ref 135–145)
TCO2: 29 mmol/L (ref 22–32)

## 2022-07-15 LAB — I-STAT VENOUS BLOOD GAS, ED
Acid-Base Excess: 6 mmol/L — ABNORMAL HIGH (ref 0.0–2.0)
Bicarbonate: 30.8 mmol/L — ABNORMAL HIGH (ref 20.0–28.0)
Calcium, Ion: 1.09 mmol/L — ABNORMAL LOW (ref 1.15–1.40)
HCT: 31 % — ABNORMAL LOW (ref 39.0–52.0)
Hemoglobin: 10.5 g/dL — ABNORMAL LOW (ref 13.0–17.0)
O2 Saturation: 85 %
Potassium: 4.8 mmol/L (ref 3.5–5.1)
Sodium: 137 mmol/L (ref 135–145)
TCO2: 32 mmol/L (ref 22–32)
pCO2, Ven: 43 mmHg — ABNORMAL LOW (ref 44–60)
pH, Ven: 7.463 — ABNORMAL HIGH (ref 7.25–7.43)
pO2, Ven: 48 mmHg — ABNORMAL HIGH (ref 32–45)

## 2022-07-15 LAB — TYPE AND SCREEN
ABO/RH(D): O POS
Antibody Screen: NEGATIVE

## 2022-07-15 LAB — TROPONIN I (HIGH SENSITIVITY)
Troponin I (High Sensitivity): 34 ng/L — ABNORMAL HIGH (ref <18)
Troponin I (High Sensitivity): 40 ng/L — ABNORMAL HIGH (ref <18)

## 2022-07-15 LAB — PROTIME-INR
INR: 1.2 (ref 0.8–1.2)
Prothrombin Time: 15.6 seconds — ABNORMAL HIGH (ref 11.4–15.2)

## 2022-07-15 LAB — CK: Total CK: 151 U/L (ref 49–397)

## 2022-07-15 LAB — LACTIC ACID, PLASMA: Lactic Acid, Venous: 1.2 mmol/L (ref 0.5–1.9)

## 2022-07-15 MED ORDER — VANCOMYCIN HCL 2000 MG/400ML IV SOLN
2000.0000 mg | Freq: Once | INTRAVENOUS | Status: AC
  Administered 2022-07-15: 2000 mg via INTRAVENOUS
  Filled 2022-07-15: qty 400

## 2022-07-15 MED ORDER — PIPERACILLIN-TAZOBACTAM IN DEX 2-0.25 GM/50ML IV SOLN
2.2500 g | Freq: Once | INTRAVENOUS | Status: AC
  Administered 2022-07-16: 2.25 g via INTRAVENOUS
  Filled 2022-07-15: qty 50

## 2022-07-15 NOTE — ED Notes (Signed)
Unable to get a good EKG due to PT being a little agitated and confused

## 2022-07-15 NOTE — ED Notes (Signed)
PT is refusing all services and doesn't want anyone to touch him. Consulting the physician for future instructions

## 2022-07-15 NOTE — ED Provider Notes (Incomplete)
[place] EMERGENCY DEPARTMENT AT [facility] Provider Note   CSN: [id number] Arrival date & time: 07/15/22  1750     History {Add pertinent medical, surgical, social history, OB history to HPI:1} Chief Complaint  Patient presents with  . Altered Mental Status    Chase [name] is a 72 y.o. male.  72 year old male with prior medical history as detailed below presents for evaluation.  Patient is arriving from [facility] with EMS transport.  EMS and patient's wife reports the patient has had gradual increased confusion over the last 48 hours.  Patient is currently being followed by Dr. Yehuda Budd with hand surgery for gangrenous right ring finger.  Per patient's wife, patient has follow-up appointment with Dr. Yehuda Budd tomorrow.  Patient has been on doxycycline to help prevent possible infection.  Patient is confused and mildly argumentative with exam.  He denies significant pain or other specific complaint.  The history is provided by the patient and medical records.       Home Medications Prior to Admission medications   Medication Sig Start Date End Date Taking? Authorizing Provider  acetaminophen (TYLENOL) 500 MG tablet Take 1,000 mg by mouth every 8 (eight) hours as needed for moderate pain or mild pain.    [provider]  albuterol (VENTOLIN HFA) 108 (90 Base) MCG/ACT inhaler Inhale 2 puffs into the lungs every 4 (four) hours as needed for wheezing or shortness of breath.    [provider]  aspirin EC 81 MG tablet Take 1 tablet (81 mg total) by mouth daily. Swallow whole. 07/02/22   Rodolph Bong, MD  buprenorphine (SUBUTEX) 2 MG SUBL SL tablet Place 1 tablet (2 mg total) under the tongue at bedtime. 07/01/22   Rodolph Bong, MD  busPIRone (BUSPAR) 10 MG tablet Take 1-2 tablets (10-20 mg total) by mouth See admin instructions. Take 2 tablets in the AM and 1 tablet in the PM. 07/01/22   Rodolph Bong, MD  calcitRIOL (ROCALTROL) 0.25 MCG  capsule Take 0.25 mcg by mouth daily.    [provider]  Calcium Acetate 667 MG TABS Take 1 tablet by mouth 3 (three) times daily.    [provider]  clopidogrel (PLAVIX) 75 MG tablet Take 1 tablet (75 mg total) by mouth daily. 07/02/22   Rodolph Bong, MD  Darbepoetin Alfa (ARANESP) 100 MCG/0.5ML SOSY injection Inject 0.5 mLs (100 mcg total) into the skin every Thursday at 6pm. 07/03/22   Rodolph Bong, MD  escitalopram (LEXAPRO) 20 MG tablet Take 20 mg by mouth daily.    [provider]  hydrOXYzine (ATARAX) 10 MG tablet Take 1 tablet (10 mg total) by mouth 3 (three) times daily as needed for anxiety. 07/01/22   Rodolph Bong, MD  levothyroxine (SYNTHROID) 125 MCG tablet Take 250 mcg by mouth daily before breakfast.    [provider]  lidocaine (LMX) 4 % cream Apply 1 Application topically once a week. 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    [provider]  midodrine (PROAMATINE) 10 MG tablet Take 1 tablet (10 mg total) by mouth Every Tuesday,Thursday,and Saturday with dialysis. 07/03/22   Rodolph Bong, MD  Nutritional Supplements (ADULT NUTRITIONAL SUPPLEMENT + PO) Take 60 mLs by mouth 2 (two) times daily.    [provider]  oxyCODONE (OXY IR/ROXICODONE) 5 MG immediate release  tablet Take 1 tablet (5 mg total) by mouth every 4 (four) hours as needed for moderate pain. 07/01/22   Rodolph Bong, MD  pantoprazole (PROTONIX) 40 MG tablet Take 40 mg by mouth 2 (two) times daily. For GERD    [provider]  rosuvastatin (CRESTOR) 10 MG tablet Take 10 mg by mouth at bedtime.    [provider]      Allergies    Gabapentin, Lipitor [atorvastatin], and Pregabalin    Review of Systems   Review of Systems  All other systems reviewed and are negative.   Physical Exam Updated Vital Signs BP (!) 149/89    Pulse 73   Temp 98.6 F (37 C) (Oral)   Resp 19   Ht 5\' 6"  (1.676 m)   SpO2 98%   BMI 32.20 kg/m  Physical Exam Vitals and nursing note reviewed.  Constitutional:      General: He is not in acute distress.    Appearance: Normal appearance. He is well-developed.  HENT:     Head: Normocephalic and atraumatic.  Eyes:     Conjunctiva/sclera: Conjunctivae normal.     Pupils: Pupils are equal, round, and reactive to light.  Cardiovascular:     Rate and Rhythm: Normal rate and regular rhythm.     Heart sounds: Normal heart sounds.  Pulmonary:     Effort: Pulmonary effort is normal. No respiratory distress.     Breath sounds: Normal breath sounds.  Abdominal:     General: There is no distension.     Palpations: Abdomen is soft.     Tenderness: There is no abdominal tenderness.  Musculoskeletal:        General: No deformity. Normal range of motion.     Cervical back: Normal range of motion and neck supple.     Comments: Necrotic distal right ring finger.  Notable erythema streaking from the gangrenous distal phalanx more proximally.  See images below.  Skin:    General: Skin is warm and dry.  Neurological:     General: No focal deficit present.     Mental Status: He is alert and oriented to person, place, and time.            ED Results / Procedures / Treatments   Labs (all labs ordered are listed, but only abnormal results are displayed) Labs Reviewed  CBC WITH DIFFERENTIAL/PLATELET - Abnormal; Notable for the following components:      Result Value   RBC 3.08 (*)    Hemoglobin 9.5 (*)    HCT 30.6 (*)    All other components within normal limits  COMPREHENSIVE METABOLIC PANEL - Abnormal; Notable for the following components:   Glucose, Bld 100 (*)    Creatinine, Ser 2.91 (*)    Calcium 8.7 (*)    Total Protein 6.3 (*)    Albumin 2.5 (*)    GFR, Estimated 22 (*)    All other components within normal limits  PROTIME-INR - Abnormal; Notable for the following  components:   Prothrombin Time 15.6 (*)    All other components within normal limits  I-STAT VENOUS BLOOD GAS, ED - Abnormal; Notable for the following components:   pH, Ven 7.463 (*)    pCO2, Ven 43.0 (*)    pO2, Ven 48 (*)    Bicarbonate 30.8 (*)    Acid-Base Excess 6.0 (*)    Calcium, Ion 1.09 (*)    HCT 31.0 (*)    Hemoglobin 10.5 (*)  All other components within normal limits  I-STAT CHEM 8, ED - Abnormal; Notable for the following components:   Creatinine, Ser 3.20 (*)    Calcium, Ion 1.07 (*)    Hemoglobin 10.2 (*)    HCT 30.0 (*)    All other components within normal limits  TROPONIN I (HIGH SENSITIVITY) - Abnormal; Notable for the following components:   Troponin I (High Sensitivity) 34 (*)    All other components within normal limits  CULTURE, BLOOD (ROUTINE X 2)  CULTURE, BLOOD (ROUTINE X 2)  CK  LACTIC ACID, PLASMA  URINALYSIS, W/ REFLEX TO CULTURE (INFECTION SUSPECTED)  AMMONIA  CBG MONITORING, ED  TYPE AND SCREEN  TROPONIN I (HIGH SENSITIVITY)    EKG None  Radiology DG Hand Complete Right  Result Date: 07/15/2022 CLINICAL DATA:  Pain.  Necrotic fourth digit. EXAM: RIGHT HAND - COMPLETE 3+ VIEW COMPARISON:  Hand radiograph 06/04/2022 FINDINGS: The soft tissues of the fourth digit distally are atrophic at the level of the distal phalanx and distal aspect of the proximal phalanx, new from prior exam. No soft tissue gas. There are extensive vascular calcifications. IV catheter is seen overlying the dorsum of the wrist. Multifocal osteoarthritis. No fracture. No erosive or bony destructive change or periostitis. IMPRESSION: 1. Atrophic soft tissues of the fourth digit distally at the level of the distal phalanx and distal aspect of the proximal phalanx, new from prior exam. No soft tissue gas or findings of osteomyelitis. 2. Multifocal osteoarthritis. Electronically Signed   By: Narda Rutherford M.D.   On: 07/15/2022 19:39   DG Chest Port 1 View  Result Date:  07/15/2022 CLINICAL DATA:  Shortness of breath. EXAM: PORTABLE CHEST 1 VIEW COMPARISON:  Chest radiograph dated 05/07/2022. FINDINGS: Shallow inspiration. Small left pleural effusion and left lung base atelectasis or infiltrate as seen on the prior radiograph and CT of 12/15/2021. The right lung is clear. Postsurgical changes of the right upper lobe. No pneumothorax. Stable cardiac silhouette. No acute osseous pathology. Thoracic spine stimulator. IMPRESSION: Small left pleural effusion and left lung base atelectasis or infiltrate. Electronically Signed   By: Elgie Collard M.D.   On: 07/15/2022 19:35    Procedures Procedures  {Document cardiac monitor, telemetry assessment procedure when appropriate:1}  Medications Ordered in ED Medications - No data to display  ED Course/ Medical Decision Making/ A&P   {   Click here for ABCD2, HEART and other calculatorsREFRESH Note before signing :1}                          Medical Decision Making Amount and/or Complexity of Data Reviewed Labs: ordered. Radiology: ordered.  Risk Prescription drug management. Decision regarding hospitalization.    Medical Screen Complete  This patient presented to the ED with complaint of AMS.  This complaint involves an extensive number of treatment options. The initial differential diagnosis includes, but is not limited to, metabolic abnormality, infection, etc.  This presentation is: Acute, Chronic, Self-Limited, Previously Undiagnosed, Uncertain Prognosis, Complicated, Systemic Symptoms, and Threat to Life/Bodily Function  Patient with multiple comorbidities including ESRD currently on HD.  Patient with recent admission for workup and evaluation of gangrenous right ring finger.  Patient currently has been on doxycycline to help prevent possible infection of the gangrenous right ring finger.  Patient was to see Dr. Yehuda Budd with hand tomorrow.  Finger will require amputation at some time, dependent upon  demarcation of gangrene.  Wife of patient noted increased  confusion over the last 48 hours.  Thus patient was brought to the ED for evaluation.  On exam patient with gangrenous right ring finger with some degree of erythema streaking more proximally.  High concern for infection despite p.o. doxycycline at home.  IV antibiotics administered and plan for medical admission made.  Dr. Frazier Butt -covering hand surgery-is aware of case.  Either Dr. Frazier Butt or Dr. Yehuda Budd will evaluate the patient tomorrow.  CT chest abdomen pelvis performed out of an abundance of caution looking for other pathology.  Incidentally, patient noted to have abdominal wall hematoma at site of recent peritoneal dialysis catheter removal.  Catheter was removed June 24 with Dr. Karin Lieu (Vascular).  Case briefly discussed with both Dr. Janee Morn (general surgery) and Dr. Randie Heinz (vascular).  Dr. Randie Heinz will review CT. Patient's hemoglobin is improved from last admission.  Patient without surgical abdomen on exam.  Hospital service is aware of case and need for admission.  Additional history obtained:  Additional history obtained from [facility] External records from outside sources obtained and reviewed including prior ED visits and prior Inpatient records.    Lab Tests:  I ordered and personally interpreted labs.    Imaging Studies ordered:  I ordered imaging studies including CT head, CT chest abdomen pelvis, right hand, chest x-ray  I agree with the radiologist interpretation.   Cardiac Monitoring:  The patient was maintained on a cardiac monitor.  I personally viewed and interpreted the cardiac monitor which showed an underlying rhythm of: NSR   Medicines ordered:  I ordered medication including antibiotics  for suspected infection  Reevaluation of the patient after these medicines showed that the patient: stayed the same  Problem List / ED Course:  AMS   Reevaluation:  After the interventions noted above, I  reevaluated the patient and found that they have: improved  Disposition:  After consideration of the diagnostic results and the patients response to treatment, I feel that the patent would benefit from admission.    {Document critical care time when appropriate:1} {Document review of labs and clinical decision tools ie heart score, Chads2Vasc2 etc:1}  {Document your independent review of radiology images, and any outside records:1} {Document your discussion with family members, caretakers, and with consultants:1} {Document social determinants of health affecting pt's care:1} {Document your decision making why or why not admission, treatments were needed:1} Final Clinical Impression(s) / ED Diagnoses Final diagnoses:  None    Rx / DC Orders ED Discharge Orders     None

## 2022-07-15 NOTE — ED Triage Notes (Addendum)
PT was BIB by [facility] from [facility] with a c/o of altered mental status that has progressively gotten worse in the past 2 days along with generalized weakness.Pt currently has a necrotic left ring finger , no obvious stroke deficits, no recent falls or head injury.

## 2022-07-15 NOTE — Progress Notes (Addendum)
ED Pharmacy Antibiotic Sign Off An antibiotic consult was received from an ED provider for vancomycin per pharmacy dosing for  wound infection . A chart review was completed to assess appropriateness.  The following one time order(s) were placed per pharmacy consult:  vancomycin 2000 mg x 1 dose Zosyn 2.25g x1 dose  Further antibiotic and/or antibiotic pharmacy consults should be ordered by the admitting provider if indicated.   Thank you for allowing pharmacy to be a part of this patient's care.   Delmar Landau, PharmD, BCPS 07/15/2022 10:11 PM ED Clinical Pharmacist -  [phone]

## 2022-07-15 NOTE — ED Provider Notes (Signed)
[facility] AT [facility] Provider Note   CSN: [id number] Arrival date & time: 07/15/22  1750     History {Add pertinent medical, surgical, social history, OB history to HPI:1} Chief Complaint  Patient presents with   Altered Mental Status    Chase [name] is a 72 y.o. male.  72 year old male with prior medical history as detailed below presents for evaluation.  Patient is arriving from [facility] with EMS transport.  EMS and patient's wife reports the patient has had gradual increased confusion over the last 48 hours.  Patient is currently being followed by Dr. Yehuda Budd with hand surgery for gangrenous right ring finger.  Per patient's wife, patient has follow-up appointment with Dr. Yehuda Budd tomorrow.  Patient has been on doxycycline to help prevent possible infection.  Patient is confused and mildly argumentative with exam.  He denies significant pain or other specific complaint.  The history is provided by the patient and medical records.       Home Medications Prior to Admission medications   Medication Sig Start Date End Date Taking? Authorizing Provider  acetaminophen (TYLENOL) 500 MG tablet Take 1,000 mg by mouth every 8 (eight) hours as needed for moderate pain or mild pain.    [provider]  albuterol (VENTOLIN HFA) 108 (90 Base) MCG/ACT inhaler Inhale 2 puffs into the lungs every 4 (four) hours as needed for wheezing or shortness of breath.    [provider]  aspirin EC 81 MG tablet Take 1 tablet (81 mg total) by mouth daily. Swallow whole. 07/02/22   Rodolph Bong, MD  buprenorphine (SUBUTEX) 2 MG SUBL SL tablet Place 1 tablet (2 mg total) under the tongue at bedtime. 07/01/22   Rodolph Bong, MD  busPIRone (BUSPAR) 10 MG tablet Take 1-2 tablets (10-20 mg total) by mouth See admin instructions. Take 2 tablets in the AM and 1 tablet in the PM. 07/01/22   Rodolph Bong, MD  calcitRIOL (ROCALTROL) 0.25 MCG  capsule Take 0.25 mcg by mouth daily.    [provider]  Calcium Acetate 667 MG TABS Take 1 tablet by mouth 3 (three) times daily.    [provider]  clopidogrel (PLAVIX) 75 MG tablet Take 1 tablet (75 mg total) by mouth daily. 07/02/22   Rodolph Bong, MD  Darbepoetin Alfa (ARANESP) 100 MCG/0.5ML SOSY injection Inject 0.5 mLs (100 mcg total) into the skin every Thursday at 6pm. 07/03/22   Rodolph Bong, MD  escitalopram (LEXAPRO) 20 MG tablet Take 20 mg by mouth daily.    [provider]  hydrOXYzine (ATARAX) 10 MG tablet Take 1 tablet (10 mg total) by mouth 3 (three) times daily as needed for anxiety. 07/01/22   Rodolph Bong, MD  levothyroxine (SYNTHROID) 125 MCG tablet Take 250 mcg by mouth daily before breakfast.    [provider]  lidocaine (LMX) 4 % cream Apply 1 Application topically once a week. 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    [provider]  midodrine (PROAMATINE) 10 MG tablet Take 1 tablet (10 mg total) by mouth Every Tuesday,Thursday,and Saturday with dialysis. 07/03/22   Rodolph Bong, MD  Nutritional Supplements (ADULT NUTRITIONAL SUPPLEMENT + PO) Take 60 mLs by mouth 2 (two) times daily.    [provider]  oxyCODONE (OXY IR/ROXICODONE) 5 MG immediate release  tablet Take 1 tablet (5 mg total) by mouth every 4 (four) hours as needed for moderate pain. 07/01/22   Rodolph Bong, MD  pantoprazole (PROTONIX) 40 MG tablet Take 40 mg by mouth 2 (two) times daily. For GERD    [provider]  rosuvastatin (CRESTOR) 10 MG tablet Take 10 mg by mouth at bedtime.    [provider]      Allergies    Gabapentin, Lipitor [atorvastatin], and Pregabalin    Review of Systems   Review of Systems  All other systems reviewed and are negative.   Physical Exam Updated Vital Signs BP (!) 149/89    Pulse 73   Temp 98.6 F (37 C) (Oral)   Resp 19   Ht 5\' 6"  (1.676 m)   SpO2 98%   BMI 32.20 kg/m  Physical Exam Vitals and nursing note reviewed.  Constitutional:      General: He is not in acute distress.    Appearance: Normal appearance. He is well-developed.  HENT:     Head: Normocephalic and atraumatic.  Eyes:     Conjunctiva/sclera: Conjunctivae normal.     Pupils: Pupils are equal, round, and reactive to light.  Cardiovascular:     Rate and Rhythm: Normal rate and regular rhythm.     Heart sounds: Normal heart sounds.  Pulmonary:     Effort: Pulmonary effort is normal. No respiratory distress.     Breath sounds: Normal breath sounds.  Abdominal:     General: There is no distension.     Palpations: Abdomen is soft.     Tenderness: There is no abdominal tenderness.  Musculoskeletal:        General: No deformity. Normal range of motion.     Cervical back: Normal range of motion and neck supple.     Comments: Necrotic distal right ring finger.  Notable erythema streaking from the gangrenous distal phalanx more proximally.  See images below.  Skin:    General: Skin is warm and dry.  Neurological:     General: No focal deficit present.     Mental Status: He is alert and oriented to person, place, and time.            ED Results / Procedures / Treatments   Labs (all labs ordered are listed, but only abnormal results are displayed) Labs Reviewed  CBC WITH DIFFERENTIAL/PLATELET - Abnormal; Notable for the following components:      Result Value   RBC 3.08 (*)    Hemoglobin 9.5 (*)    HCT 30.6 (*)    All other components within normal limits  COMPREHENSIVE METABOLIC PANEL - Abnormal; Notable for the following components:   Glucose, Bld 100 (*)    Creatinine, Ser 2.91 (*)    Calcium 8.7 (*)    Total Protein 6.3 (*)    Albumin 2.5 (*)    GFR, Estimated 22 (*)    All other components within normal limits  PROTIME-INR - Abnormal; Notable for the following  components:   Prothrombin Time 15.6 (*)    All other components within normal limits  I-STAT VENOUS BLOOD GAS, ED - Abnormal; Notable for the following components:   pH, Ven 7.463 (*)    pCO2, Ven 43.0 (*)    pO2, Ven 48 (*)    Bicarbonate 30.8 (*)    Acid-Base Excess 6.0 (*)    Calcium, Ion 1.09 (*)    HCT 31.0 (*)    Hemoglobin 10.5 (*)  All other components within normal limits  I-STAT CHEM 8, ED - Abnormal; Notable for the following components:   Creatinine, Ser 3.20 (*)    Calcium, Ion 1.07 (*)    Hemoglobin 10.2 (*)    HCT 30.0 (*)    All other components within normal limits  TROPONIN I (HIGH SENSITIVITY) - Abnormal; Notable for the following components:   Troponin I (High Sensitivity) 34 (*)    All other components within normal limits  CULTURE, BLOOD (ROUTINE X 2)  CULTURE, BLOOD (ROUTINE X 2)  CK  LACTIC ACID, PLASMA  URINALYSIS, W/ REFLEX TO CULTURE (INFECTION SUSPECTED)  AMMONIA  CBG MONITORING, ED  TYPE AND SCREEN  TROPONIN I (HIGH SENSITIVITY)    EKG None  Radiology DG Hand Complete Right  Result Date: 07/15/2022 CLINICAL DATA:  Pain.  Necrotic fourth digit. EXAM: RIGHT HAND - COMPLETE 3+ VIEW COMPARISON:  Hand radiograph 06/04/2022 FINDINGS: The soft tissues of the fourth digit distally are atrophic at the level of the distal phalanx and distal aspect of the proximal phalanx, new from prior exam. No soft tissue gas. There are extensive vascular calcifications. IV catheter is seen overlying the dorsum of the wrist. Multifocal osteoarthritis. No fracture. No erosive or bony destructive change or periostitis. IMPRESSION: 1. Atrophic soft tissues of the fourth digit distally at the level of the distal phalanx and distal aspect of the proximal phalanx, new from prior exam. No soft tissue gas or findings of osteomyelitis. 2. Multifocal osteoarthritis. Electronically Signed   By: Narda Rutherford M.D.   On: 07/15/2022 19:39   DG Chest Port 1 View  Result Date:  07/15/2022 CLINICAL DATA:  Shortness of breath. EXAM: PORTABLE CHEST 1 VIEW COMPARISON:  Chest radiograph dated 05/07/2022. FINDINGS: Shallow inspiration. Small left pleural effusion and left lung base atelectasis or infiltrate as seen on the prior radiograph and CT of 12/15/2021. The right lung is clear. Postsurgical changes of the right upper lobe. No pneumothorax. Stable cardiac silhouette. No acute osseous pathology. Thoracic spine stimulator. IMPRESSION: Small left pleural effusion and left lung base atelectasis or infiltrate. Electronically Signed   By: Elgie Collard M.D.   On: 07/15/2022 19:35    Procedures Procedures  {Document cardiac monitor, telemetry assessment procedure when appropriate:1}  Medications Ordered in ED Medications - No data to display  ED Course/ Medical Decision Making/ A&P   {   Click here for ABCD2, HEART and other calculatorsREFRESH Note before signing :1}                          Medical Decision Making Amount and/or Complexity of Data Reviewed Labs: ordered. Radiology: ordered.    Medical Screen Complete  This patient presented to the ED with complaint of ***.  This complaint involves an extensive number of treatment options. The initial differential diagnosis includes, but is not limited to, ***  This presentation is: {IllnessRisk:19196::"***","Acute","Chronic","Self-Limited","Previously Undiagnosed","Uncertain Prognosis","Complicated","Systemic Symptoms","Threat to Life/Bodily Function"}    Co morbidities that complicated the patient's evaluation  ***   Additional history obtained:  Additional history obtained from {History source:19196::"EMS","Spouse","Family","Friend","Caregiver"} External records from outside sources obtained and reviewed including prior ED visits and prior Inpatient records.    Lab Tests:  I ordered and personally interpreted labs.  The pertinent results include:  ***   Imaging Studies ordered:  I ordered  imaging studies including ***  I independently visualized and interpreted obtained imaging which showed *** I agree with the radiologist interpretation.  Cardiac Monitoring:  The patient was maintained on a cardiac monitor.  I personally viewed and interpreted the cardiac monitor which showed an underlying rhythm of: ***   Medicines ordered:  I ordered medication including ***  for ***  Reevaluation of the patient after these medicines showed that the patient: {resolved/improved/worsened:23923::"improved"}    Test Considered:  ***   Critical Interventions:  ***   Consultations Obtained:  I consulted ***,  and discussed lab and imaging findings as well as pertinent plan of care.    Problem List / ED Course:  ***   Reevaluation:  After the interventions noted above, I reevaluated the patient and found that they have: {resolved/improved/worsened:23923::"improved"}   Social Determinants of Health:  ***   Disposition:  After consideration of the diagnostic results and the patients response to treatment, I feel that the patent would benefit from ***.    {Document critical care time when appropriate:1} {Document review of labs and clinical decision tools ie heart score, Chads2Vasc2 etc:1}  {Document your independent review of radiology images, and any outside records:1} {Document your discussion with family members, caretakers, and with consultants:1} {Document social determinants of health affecting pt's care:1} {Document your decision making why or why not admission, treatments were needed:1} Final Clinical Impression(s) / ED Diagnoses Final diagnoses:  None    Rx / DC Orders ED Discharge Orders     None

## 2022-07-16 ENCOUNTER — Encounter (HOSPITAL_COMMUNITY): Payer: Self-pay | Admitting: Internal Medicine

## 2022-07-16 DIAGNOSIS — R911 Solitary pulmonary nodule: Secondary | ICD-10-CM | POA: Diagnosis present

## 2022-07-16 DIAGNOSIS — I96 Gangrene, not elsewhere classified: Secondary | ICD-10-CM | POA: Diagnosis present

## 2022-07-16 DIAGNOSIS — N3090 Cystitis, unspecified without hematuria: Secondary | ICD-10-CM | POA: Diagnosis not present

## 2022-07-16 DIAGNOSIS — I953 Hypotension of hemodialysis: Secondary | ICD-10-CM | POA: Diagnosis present

## 2022-07-16 DIAGNOSIS — J9811 Atelectasis: Secondary | ICD-10-CM | POA: Diagnosis present

## 2022-07-16 DIAGNOSIS — M898X9 Other specified disorders of bone, unspecified site: Secondary | ICD-10-CM | POA: Diagnosis present

## 2022-07-16 DIAGNOSIS — J90 Pleural effusion, not elsewhere classified: Secondary | ICD-10-CM | POA: Diagnosis present

## 2022-07-16 DIAGNOSIS — I120 Hypertensive chronic kidney disease with stage 5 chronic kidney disease or end stage renal disease: Secondary | ICD-10-CM | POA: Diagnosis not present

## 2022-07-16 DIAGNOSIS — D631 Anemia in chronic kidney disease: Secondary | ICD-10-CM | POA: Diagnosis present

## 2022-07-16 DIAGNOSIS — L7632 Postprocedural hematoma of skin and subcutaneous tissue following other procedure: Secondary | ICD-10-CM | POA: Diagnosis present

## 2022-07-16 DIAGNOSIS — E1152 Type 2 diabetes mellitus with diabetic peripheral angiopathy with gangrene: Secondary | ICD-10-CM | POA: Diagnosis not present

## 2022-07-16 DIAGNOSIS — E1122 Type 2 diabetes mellitus with diabetic chronic kidney disease: Secondary | ICD-10-CM | POA: Diagnosis not present

## 2022-07-16 DIAGNOSIS — N186 End stage renal disease: Secondary | ICD-10-CM | POA: Diagnosis not present

## 2022-07-16 DIAGNOSIS — Z66 Do not resuscitate: Secondary | ICD-10-CM | POA: Diagnosis present

## 2022-07-16 DIAGNOSIS — K219 Gastro-esophageal reflux disease without esophagitis: Secondary | ICD-10-CM | POA: Diagnosis present

## 2022-07-16 DIAGNOSIS — I2510 Atherosclerotic heart disease of native coronary artery without angina pectoris: Secondary | ICD-10-CM | POA: Diagnosis present

## 2022-07-16 DIAGNOSIS — M542 Cervicalgia: Secondary | ICD-10-CM | POA: Diagnosis present

## 2022-07-16 DIAGNOSIS — K661 Hemoperitoneum: Secondary | ICD-10-CM

## 2022-07-16 DIAGNOSIS — G894 Chronic pain syndrome: Secondary | ICD-10-CM | POA: Diagnosis present

## 2022-07-16 DIAGNOSIS — Z79890 Hormone replacement therapy (postmenopausal): Secondary | ICD-10-CM | POA: Diagnosis not present

## 2022-07-16 DIAGNOSIS — F32A Depression, unspecified: Secondary | ICD-10-CM | POA: Diagnosis present

## 2022-07-16 DIAGNOSIS — Z7982 Long term (current) use of aspirin: Secondary | ICD-10-CM | POA: Diagnosis not present

## 2022-07-16 DIAGNOSIS — R4182 Altered mental status, unspecified: Secondary | ICD-10-CM | POA: Diagnosis present

## 2022-07-16 DIAGNOSIS — Z992 Dependence on renal dialysis: Secondary | ICD-10-CM | POA: Diagnosis not present

## 2022-07-16 DIAGNOSIS — F419 Anxiety disorder, unspecified: Secondary | ICD-10-CM | POA: Diagnosis present

## 2022-07-16 DIAGNOSIS — R7989 Other specified abnormal findings of blood chemistry: Secondary | ICD-10-CM

## 2022-07-16 DIAGNOSIS — G9341 Metabolic encephalopathy: Secondary | ICD-10-CM | POA: Diagnosis present

## 2022-07-16 DIAGNOSIS — E039 Hypothyroidism, unspecified: Secondary | ICD-10-CM | POA: Diagnosis present

## 2022-07-16 LAB — BASIC METABOLIC PANEL
Anion gap: 15 (ref 5–15)
BUN: 18 mg/dL (ref 8–23)
CO2: 25 mmol/L (ref 22–32)
Calcium: 8.9 mg/dL (ref 8.9–10.3)
Chloride: 99 mmol/L (ref 98–111)
Creatinine, Ser: 3.31 mg/dL — ABNORMAL HIGH (ref 0.61–1.24)
GFR, Estimated: 19 mL/min — ABNORMAL LOW (ref >60)
Glucose, Bld: 80 mg/dL (ref 70–99)
Potassium: 3.8 mmol/L (ref 3.5–5.1)
Sodium: 139 mmol/L (ref 135–145)

## 2022-07-16 LAB — CBC
HCT: 30.4 % — ABNORMAL LOW (ref 39.0–52.0)
Hemoglobin: 9.1 g/dL — ABNORMAL LOW (ref 13.0–17.0)
MCH: 29.5 pg (ref 26.0–34.0)
MCHC: 29.9 g/dL — ABNORMAL LOW (ref 30.0–36.0)
MCV: 98.7 fL (ref 80.0–100.0)
Platelets: 177 10*3/uL (ref 150–400)
RBC: 3.08 MIL/uL — ABNORMAL LOW (ref 4.22–5.81)
RDW: 15.1 % (ref 11.5–15.5)
WBC: 7.6 10*3/uL (ref 4.0–10.5)
nRBC: 0 % (ref 0.0–0.2)

## 2022-07-16 LAB — HEPATITIS B SURFACE ANTIGEN: Hepatitis B Surface Ag: NONREACTIVE

## 2022-07-16 LAB — TROPONIN I (HIGH SENSITIVITY): Troponin I (High Sensitivity): 40 ng/L — ABNORMAL HIGH (ref <18)

## 2022-07-16 LAB — HEMOGLOBIN A1C
Hgb A1c MFr Bld: 4.4 % — ABNORMAL LOW (ref 4.8–5.6)
Mean Plasma Glucose: 79.58 mg/dL

## 2022-07-16 LAB — AMMONIA: Ammonia: 19 umol/L (ref 9–35)

## 2022-07-16 MED ORDER — ESCITALOPRAM OXALATE 10 MG PO TABS
20.0000 mg | ORAL_TABLET | Freq: Every day | ORAL | Status: DC
  Administered 2022-07-16 – 2022-07-18 (×2): 20 mg via ORAL
  Filled 2022-07-16 (×3): qty 2

## 2022-07-16 MED ORDER — PANTOPRAZOLE SODIUM 40 MG PO TBEC
40.0000 mg | DELAYED_RELEASE_TABLET | Freq: Two times a day (BID) | ORAL | Status: DC
  Administered 2022-07-16 – 2022-07-18 (×5): 40 mg via ORAL
  Filled 2022-07-16 (×6): qty 1

## 2022-07-16 MED ORDER — CEFAZOLIN SODIUM-DEXTROSE 2-4 GM/100ML-% IV SOLN
2.0000 g | INTRAVENOUS | Status: AC
  Administered 2022-07-17: 2 g via INTRAVENOUS
  Filled 2022-07-16: qty 100

## 2022-07-16 MED ORDER — BUPRENORPHINE HCL 2 MG SL SUBL
2.0000 mg | SUBLINGUAL_TABLET | Freq: Every day | SUBLINGUAL | Status: DC
  Administered 2022-07-16 – 2022-07-17 (×2): 2 mg via SUBLINGUAL
  Filled 2022-07-16 (×2): qty 1

## 2022-07-16 MED ORDER — BUSPIRONE HCL 10 MG PO TABS
10.0000 mg | ORAL_TABLET | Freq: Every day | ORAL | Status: DC
  Administered 2022-07-16 – 2022-07-17 (×2): 10 mg via ORAL
  Filled 2022-07-16 (×2): qty 1

## 2022-07-16 MED ORDER — ACETAMINOPHEN 325 MG PO TABS
650.0000 mg | ORAL_TABLET | Freq: Four times a day (QID) | ORAL | Status: DC | PRN
  Administered 2022-07-16: 650 mg via ORAL
  Filled 2022-07-16: qty 2

## 2022-07-16 MED ORDER — VANCOMYCIN HCL IN DEXTROSE 1-5 GM/200ML-% IV SOLN: 1000.0000 mg | INTRAVENOUS | Status: DC

## 2022-07-16 MED ORDER — OXYCODONE HCL 5 MG PO TABS
2.5000 mg | ORAL_TABLET | Freq: Four times a day (QID) | ORAL | Status: DC | PRN
  Administered 2022-07-18 (×3): 2.5 mg via ORAL
  Filled 2022-07-16 (×3): qty 1

## 2022-07-16 MED ORDER — CALCIUM ACETATE (PHOS BINDER) 667 MG PO CAPS
667.0000 mg | ORAL_CAPSULE | Freq: Three times a day (TID) | ORAL | Status: DC
  Administered 2022-07-18: 667 mg via ORAL
  Filled 2022-07-16 (×2): qty 1

## 2022-07-16 MED ORDER — CALCITRIOL 0.25 MCG PO CAPS
0.2500 ug | ORAL_CAPSULE | Freq: Every day | ORAL | Status: DC
  Administered 2022-07-16 – 2022-07-18 (×2): 0.25 ug via ORAL
  Filled 2022-07-16 (×3): qty 1

## 2022-07-16 MED ORDER — CHLORHEXIDINE GLUCONATE CLOTH 2 % EX PADS
6.0000 | MEDICATED_PAD | Freq: Every day | CUTANEOUS | Status: DC
  Administered 2022-07-17 – 2022-07-18 (×2): 6 via TOPICAL

## 2022-07-16 MED ORDER — ROSUVASTATIN CALCIUM 5 MG PO TABS
10.0000 mg | ORAL_TABLET | Freq: Every day | ORAL | Status: DC
  Administered 2022-07-16 – 2022-07-17 (×2): 10 mg via ORAL
  Filled 2022-07-16 (×2): qty 2

## 2022-07-16 MED ORDER — MIDODRINE HCL 5 MG PO TABS: 10.0000 mg | ORAL_TABLET | ORAL | Status: DC

## 2022-07-16 MED ORDER — VANCOMYCIN HCL IN DEXTROSE 1-5 GM/200ML-% IV SOLN
1000.0000 mg | INTRAVENOUS | Status: DC
  Administered 2022-07-17: 1000 mg via INTRAVENOUS
  Filled 2022-07-16: qty 200

## 2022-07-16 MED ORDER — BUSPIRONE HCL 10 MG PO TABS
20.0000 mg | ORAL_TABLET | Freq: Every day | ORAL | Status: DC
  Administered 2022-07-16 – 2022-07-18 (×2): 20 mg via ORAL
  Filled 2022-07-16 (×3): qty 2

## 2022-07-16 MED ORDER — ACETAMINOPHEN 650 MG RE SUPP: 650.0000 mg | Freq: Four times a day (QID) | RECTAL | Status: DC | PRN

## 2022-07-16 MED ORDER — PIPERACILLIN-TAZOBACTAM IN DEX 2-0.25 GM/50ML IV SOLN
2.2500 g | Freq: Three times a day (TID) | INTRAVENOUS | Status: DC
  Administered 2022-07-16 – 2022-07-18 (×6): 2.25 g via INTRAVENOUS
  Filled 2022-07-16 (×9): qty 50

## 2022-07-16 MED ORDER — LEVOTHYROXINE SODIUM 50 MCG PO TABS
250.0000 ug | ORAL_TABLET | Freq: Every day | ORAL | Status: DC
  Administered 2022-07-16 – 2022-07-18 (×3): 250 ug via ORAL
  Filled 2022-07-16 (×3): qty 1

## 2022-07-16 NOTE — Progress Notes (Signed)
Plan for right ring finger amputation in OR tomorrow, 07/17/22 afternoon.   Please keep NPO at midnight for surgery tomorrow.  Philipp Ovens, MD

## 2022-07-16 NOTE — ED Notes (Signed)
ED TO INPATIENT HANDOFF REPORT  ED Nurse Name and Phone #:  Gillis Ends [phone]  S Name/Age/Gender Chase [name] 72 y.o. male Room/Bed: 008C/008C  Code Status   Code Status: Full Code  Home/SNF/Other Skilled nursing facility Patient oriented to: self Is this baseline? No   Triage Complete: Triage complete  Chief Complaint Gangrene of finger (HCC) [I96]  Triage Note PT was BIB by [facility] from [facility] with a c/o of altered mental status that has progressively gotten worse in the past 2 days along with generalized weakness.Pt currently has a necrotic left ring finger , no obvious stroke deficits, no recent falls or head injury.   Allergies Allergies  Allergen Reactions   Gabapentin     Cognitive impairment per spouse   Lipitor [Atorvastatin]     Severe muscle cramps per spouse   Nsaids Other (See Comments)    Severe renal impairment per [facility]   Pregabalin     Cognitive impairment. Dizziness.    Level of Care/Admitting Diagnosis ED Disposition     ED Disposition  Admit   Condition  --   Comment  Hospital Area: [facility] [[id number]]  Level of Care: Telemetry Medical [104]  May admit patient to [facility] or [facility] if equivalent level of care is available:: Yes  Covid Evaluation: Asymptomatic - no recent exposure (last 10 days) testing not required  Diagnosis: Gangrene of finger [facility]) [[id number]]  Admitting Physician: John Giovanni [[id number]]  Attending Physician: John Giovanni [[id number]]  Certification:: I certify this patient will need inpatient services for at least 2 midnights  Estimated Length of Stay: 2          B Medical/Surgery History Past Medical History:  Diagnosis Date   Back pain with radiation    Coronary artery disease    Depression    Diabetes mellitus without complication (HCC)    Dialysis patient (HCC)    M, W, F   History of traumatic head injury    Hypertension    Renal disorder    Thyroid  disease    Past Surgical History:  Procedure Laterality Date   CAPD REMOVAL N/A 06/30/2022   Procedure: REMOVAL CONTINUOUS AMBULATORY PERITONEAL DIALYSIS  (CAPD) CATHETER;  Surgeon: Victorino Sparrow, MD;  Location: [facility] OR;  Service: Vascular;  Laterality: N/A;   ERCP     PERIPHERAL VASCULAR BALLOON ANGIOPLASTY Right 06/25/2022   Procedure: PERIPHERAL VASCULAR BALLOON ANGIOPLASTY;  Surgeon: Cephus Shelling, MD;  Location: [facility] INVASIVE CV LAB;  Service: Cardiovascular;  Laterality: Right;  Radial   UPPER EXTREMITY ANGIOGRAPHY N/A 06/25/2022   Procedure: Upper Extremity Angiography;  Surgeon: Cephus Shelling, MD;  Location: [facility] INVASIVE CV LAB;  Service: Cardiovascular;  Laterality: N/A;     A IV Location/Drains/Wounds Patient Lines/Drains/Airways Status     Active Line/Drains/Airways     Name Placement date Placement time Site Days   Peripheral IV 07/15/22 20 G Posterior;Right Hand 07/15/22  2215  Hand  1   Fistula / Graft Left Forearm 10/25/20  0500  Forearm  629   Wound / Incision (Open or Dehisced) 06/23/22 Other (Comment) Finger (Comment which one) Anterior;Right 06/23/22  2100  Finger (Comment which one)  23            Intake/Output Last 24 hours  Intake/Output Summary (Last 24 hours) at 07/16/2022 0304 Last data filed at 07/16/2022 0148 Gross per 24 hour  Intake 450 ml  Output --  Net 450 ml  Labs/Imaging Results for orders placed or performed during the hospital encounter of 07/15/22 (from the past 48 hour(s))  Type and screen [facility]     Status: None   Collection Time: 07/15/22  6:47 PM  Result Value Ref Range   ABO/RH(D) O POS    Antibody Screen NEG    Sample Expiration      07/18/2022,2359 Performed at [facility], [address], [place], [place] [address]   CBC with Differential     Status: Abnormal   Collection Time: 07/15/22  6:51 PM  Result Value Ref Range   WBC 7.9 4.0 - 10.5 K/uL   RBC 3.08 (L) 4.22 - 5.81 MIL/uL    Hemoglobin 9.5 (L) 13.0 - 17.0 g/dL   HCT 42.7 (L) 06.2 - 37.6 %   MCV 99.4 80.0 - 100.0 fL   MCH 30.8 26.0 - 34.0 pg   MCHC 31.0 30.0 - 36.0 g/dL   RDW 28.3 15.1 - 76.1 %   Platelets 185 150 - 400 K/uL   nRBC 0.0 0.0 - 0.2 %   Neutrophils Relative % 72 %   Neutro Abs 5.7 1.7 - 7.7 K/uL   Lymphocytes Relative 14 %   Lymphs Abs 1.1 0.7 - 4.0 K/uL   Monocytes Relative 9 %   Monocytes Absolute 0.7 0.1 - 1.0 K/uL   Eosinophils Relative 3 %   Eosinophils Absolute 0.3 0.0 - 0.5 K/uL   Basophils Relative 1 %   Basophils Absolute 0.1 0.0 - 0.1 K/uL   Immature Granulocytes 1 %   Abs Immature Granulocytes 0.04 0.00 - 0.07 K/uL    Comment: Performed at [facility], [address] [place], [place] [address]  Troponin I (High Sensitivity)     Status: Abnormal   Collection Time: 07/15/22  6:51 PM  Result Value Ref Range   Troponin I (High Sensitivity) 34 (H) <18 ng/L    Comment: (NOTE) Elevated high sensitivity troponin I (hsTnI) values and significant  changes across serial measurements may suggest ACS but many other  chronic and acute conditions are known to elevate hsTnI results.  Refer to the "Links" section for chest pain algorithms and additional  guidance. Performed at [facility], [address] [place], [place] [address]   Comprehensive metabolic panel     Status: Abnormal   Collection Time: 07/15/22  6:51 PM  Result Value Ref Range   Sodium 139 135 - 145 mmol/L   Potassium 4.8 3.5 - 5.1 mmol/L    Comment: HEMOLYSIS AT THIS LEVEL MAY AFFECT RESULT   Chloride 100 98 - 111 mmol/L   CO2 25 22 - 32 mmol/L   Glucose, Bld 100 (H) 70 - 99 mg/dL    Comment: Glucose reference range applies only to samples taken after fasting for at least 8 hours.   BUN 16 8 - 23 mg/dL   Creatinine, Ser 9.48 (H) 0.61 - 1.24 mg/dL   Calcium 8.7 (L) 8.9 - 10.3 mg/dL   Total Protein 6.3 (L) 6.5 - 8.1 g/dL   Albumin 2.5 (L) 3.5 - 5.0 g/dL   AST 23 15 - 41 U/L   ALT 6 0 - 44 U/L    Alkaline Phosphatase 112 38 - 126 U/L   Total Bilirubin 1.1 0.3 - 1.2 mg/dL   GFR, Estimated 22 (L) >60 mL/min    Comment: (NOTE) Calculated using the CKD-EPI Creatinine Equation (2021)    Anion gap 14 5 - 15  Comment: Performed at [facility], [address] [place], [place] [address]  CK     Status: None   Collection Time: 07/15/22  6:51 PM  Result Value Ref Range   Total CK 151 49 - 397 U/L    Comment: Performed at [facility], [address] [place], [place] [address]  Protime-INR     Status: Abnormal   Collection Time: 07/15/22  6:51 PM  Result Value Ref Range   Prothrombin Time 15.6 (H) 11.4 - 15.2 seconds   INR 1.2 0.8 - 1.2    Comment: (NOTE) INR goal varies based on device and disease states. Performed at [facility], [address] [place], [place] [address]   Lactic acid, plasma     Status: None   Collection Time: 07/15/22  6:51 PM  Result Value Ref Range   Lactic Acid, Venous 1.2 0.5 - 1.9 mmol/L    Comment: Performed at [facility], [address] [place], [place] [address]  I-stat chem 8, ED     Status: Abnormal   Collection Time: 07/15/22  7:07 PM  Result Value Ref Range   Sodium 137 135 - 145 mmol/L   Potassium 4.7 3.5 - 5.1 mmol/L   Chloride 100 98 - 111 mmol/L   BUN 20 8 - 23 mg/dL   Creatinine, Ser 6.57 (H) 0.61 - 1.24 mg/dL   Glucose, Bld 97 70 - 99 mg/dL    Comment: Glucose reference range applies only to samples taken after fasting for at least 8 hours.   Calcium, Ion 1.07 (L) 1.15 - 1.40 mmol/L   TCO2 29 22 - 32 mmol/L   Hemoglobin 10.2 (L) 13.0 - 17.0 g/dL   HCT 84.6 (L) 96.2 - 95.2 %  I-Stat venous blood gas, ED     Status: Abnormal   Collection Time: 07/15/22  7:08 PM  Result Value Ref Range   pH, Ven 7.463 (H) 7.25 - 7.43   pCO2, Ven 43.0 (L) 44 - 60 mmHg   pO2, Ven 48 (H) 32 - 45 mmHg   Bicarbonate 30.8 (H) 20.0 - 28.0 mmol/L   TCO2 32 22 - 32 mmol/L   O2 Saturation 85 %   Acid-Base Excess 6.0 (H) 0.0 -  2.0 mmol/L   Sodium 137 135 - 145 mmol/L   Potassium 4.8 3.5 - 5.1 mmol/L   Calcium, Ion 1.09 (L) 1.15 - 1.40 mmol/L   HCT 31.0 (L) 39.0 - 52.0 %   Hemoglobin 10.5 (L) 13.0 - 17.0 g/dL   Sample type VENOUS   Troponin I (High Sensitivity)     Status: Abnormal   Collection Time: 07/15/22 10:10 PM  Result Value Ref Range   Troponin I (High Sensitivity) 40 (H) <18 ng/L    Comment: (NOTE) Elevated high sensitivity troponin I (hsTnI) values and significant  changes across serial measurements may suggest ACS but many other  chronic and acute conditions are known to elevate hsTnI results.  Refer to the "Links" section for chest pain algorithms and additional  guidance. Performed at [facility], [address] [place], [place] [address]   Ammonia     Status: None   Collection Time: 07/16/22  1:00 AM  Result Value Ref Range   Ammonia 19 9 - 35 umol/L    Comment: Performed at [place] [facility], [address], [place], [place] [address]   CT CHEST ABDOMEN PELVIS WO CONTRAST  Result Date: 07/15/2022  CLINICAL DATA:  Nonlocalized abdominal pain. History of GIST of the stomach, left nephrectomy, cholecystectomy. Recent removal of peritoneal dialysis catheter 06/30/2022. EXAM: CT CHEST, ABDOMEN AND PELVIS WITHOUT CONTRAST TECHNIQUE: Multidetector CT imaging of the chest, abdomen and pelvis was performed following the standard protocol without IV contrast. RADIATION DOSE REDUCTION: This exam was performed according to the departmental dose-optimization program which includes automated exposure control, adjustment of the mA and/or kV according to patient size and/or use of iterative reconstruction technique. COMPARISON:  Portable chest today, portable chest 05/07/2022, chest, abdomen and pelvis CTs with contrast 04/11/2022 and 10/21/2021. FINDINGS: CT CHEST FINDINGS Cardiovascular: Stable cardiomegaly with patchy three-vessel calcific CAD. No substantial pericardial effusion. Chronically prominent  pulmonary trunk measuring 2.9 cm is unchanged. There are heavy calcifications and mild tortuosity of the thoracic aorta, patchy calcific plaques in the great vessels. There is no aortic aneurysm. There are calcifications along the aortic valve leaflets as well. Pulmonary veins are normal caliber. Mediastinum/Nodes: There are multiple borderline prominent anterior and middle mediastinal lymph nodes, fever 2 slightly prominent, largest is a subcarinal lymph node measuring 1 cm in short axis. There is no new or enlarging adenopathy. The thyroid gland, axillary spaces are unremarkable. The thoracic esophagus, thoracic trachea, and main bronchi are unremarkable. Lungs/Pleura: There is a small left posterior basal pleural effusion, which now appears loculated. Left lower lobe base adjacent atelectasis or consolidation which is unchanged in appearance. The lungs exhibit scattered linear scar-like opacities, likely postsurgical scarring in the right upper lobe, with a chronically elevated right diaphragm. There is a stable 8 mm noncalcified right lower lobe nodule on 5:85. Stable back to 01/23/2021. Left lower lobe volume loss again is suggested adjacent the pleural effusion. Central airways are patent. No new abnormality in the lung fields. Musculoskeletal: Extensive thoracic spine bridging enthesopathy no acute or other significant osseous findings. Spinal cord stimulator wiring again terminates at T6 and separately at T11. There is bridging enthesopathy along lower thoracic spinous processes. Mild gynecomastia. CT ABDOMEN PELVIS FINDINGS Hepatobiliary: Subcentimeter nonspecific hypodensity is again noted inferiorly in segment 6. No other focal liver abnormality is seen without contrast. There is increased intrahepatic and extrahepatic biliary dilatation post cholecystectomy, with common bile duct now 2 cm. The cystic duct remnant and the common bile duct are both packed with numerous subcentimeter rim calcified stones. I  believe these were present previously at least on the last CT, but there was pneumobilia on that study confusing the picture. Pancreas: No focal abnormality or ductal dilatation is seen without contrast. Unchanged periampullary duodenal diverticulum. Spleen: There are calcified hilar arterial plaques. No splenomegaly. No other focal abnormality. Adrenals/Urinary Tract: Left nephrectomy. 1.1 cm hyperdense cyst upper pole right kidney. There are scattered collecting system stones, medullary nephrocalcinosis and a 1.2 cm right renal pelvis/UPJ stone without hydronephrosis which I believe was also present on the last CT. Collecting system stones measure up to 2 cm. There are additional too small to characterize renal hypodensities in a few small cysts. There is no adrenal mass. No suspicious renal lesion without contrast. No right ureteral stone. There is mild bladder thickening and perivesical haziness consistent with cystitis. Stomach/Bowel: Somewhat elongated stomach is unchanged in appearance with mild thickened folds proximally. There is no small bowel obstruction or inflammation. There is advanced colonic diverticulosis. Chronic thickening in the wall of the sigmoid colon is unchanged, without overt inflammatory reaction. Increased rectal fecal retention is seen without findings of stercoral proctitis. Vascular/Lymphatic: There is heavy aortoiliac and visceral arterial calcific  plaque no AAA. No adenopathy is seen. Reproductive: Prostate is normal in size. Other: There is hemoperitoneum with irregular Hematoma measuring 37-45 Hounsfield units collecting adjacent the left rectus sheath in the mid to lower abdomen measuring as much as 12 cm coronal, 3.4 cm AP and extending for a craniocaudal length up to 10 cm. There is a small volume of free hemorrhagic content in the distal paracolic gutters and pelvis. The hemorrhagic source is most likely from the left rectus muscle/sheath as this is where the peritoneal  dialysis catheter entered. There is mild body wall anasarca which was not seen previously. There is no free air or abscess. Mild mesenteric congestive features appear similar. Musculoskeletal: There is osteopenia with degenerative changes of the lumbar spine and chronic compression fracture of L2, moderate hip DJD. No destructive osseous or lytic lesions. In addition to bridging enthesopathy there are bridging syndesmophytes primarily of the thoracic spine. IMPRESSION: 1. Left mid to lower abdominal hemoperitoneum, most likely from the left rectus sheath/sheath as this is where the peritoneal dialysis catheter entered and was recently removed. The largest amount is in a hematoma along the inner surface of the left abdominal wall. 2. Small volume of free hemorrhagic content in the distal paracolic gutters and pelvis. 3. Likely cystitis. 4. Small loculated left posterior basal pleural effusion with adjacent atelectasis or consolidation, unchanged. 5. Stable 8 mm right lower lobe nodule back to 01/23/2021. Attention on follow-up scans recommended. 6. Cardiomegaly with aortic and coronary artery atherosclerosis. Chronic prominence of the pulmonary trunk. 7. Stable borderline prominent mediastinal lymph nodes. 8. Increased intrahepatic and extrahepatic biliary dilatation post cholecystectomy, with cystic duct remnant and common bile duct both packed with numerous subcentimeter rim calcified stones. 9. Bilateral nephrolithiasis and medullary nephrocalcinosis with 1.2 cm right renal pelvis/UPJ stone without hydronephrosis. 10. Chronic thickening in the sigmoid colon without overt inflammatory reaction. Diffuse diverticulosis. 11. Increased rectal fecal retention. No findings of stercoral proctitis. 12. Osteopenia and degenerative/DISH change. Chronic L2 compression fracture. 13. Critical Value/emergent results were called by telephone at the time of interpretation on 07/15/2022 at 11:27 pm to provider [facility] , who  verbally acknowledged these results. Electronically Signed   By: Almira Bar M.D.   On: 07/15/2022 23:37   CT Head Wo Contrast  Result Date: 07/15/2022 CLINICAL DATA:  Mental status change EXAM: CT HEAD WITHOUT CONTRAST TECHNIQUE: Contiguous axial images were obtained from the base of the skull through the vertex without intravenous contrast. RADIATION DOSE REDUCTION: This exam was performed according to the departmental dose-optimization program which includes automated exposure control, adjustment of the mA and/or kV according to patient size and/or use of iterative reconstruction technique. COMPARISON:  CT 12/15/2021 FINDINGS: Brain: No evidence of acute infarction, hemorrhage, hydrocephalus, extra-axial collection or mass lesion/mass effect. There is moderate diffuse atrophy. There is moderate patchy periventricular and deep white matter hypodensity, likely chronic small vessel ischemic change. This is similar to prior. 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 Vascular: There are atherosclerotic calcifications of the intracranial internal carotid arteries, vertebral arteries and peripheral vasculature. Skull: Normal. Negative for fracture or focal lesion. Sinuses/Orbits: No acute finding. Other: None. IMPRESSION: 1. No acute intracranial process. 2. Moderate chronic small vessel ischemic change and atrophy. Electronically Signed   By: Darliss Cheney M.D.   On: 07/15/2022 22:52   DG Hand Complete Right  Result Date: 07/15/2022 CLINICAL DATA:  Pain.  Necrotic fourth digit. EXAM: RIGHT HAND - COMPLETE 3+ VIEW COMPARISON:  Hand radiograph 06/04/2022  FINDINGS: The soft tissues of the fourth digit distally are atrophic at the level of the distal phalanx and distal aspect of the proximal phalanx, new from prior exam. No soft tissue gas. There are extensive vascular calcifications. IV catheter is seen overlying the dorsum of the wrist. Multifocal osteoarthritis. No fracture. No erosive or bony  destructive change or periostitis. IMPRESSION: 1. Atrophic soft tissues of the fourth digit distally at the level of the distal phalanx and distal aspect of the proximal phalanx, new from prior exam. No soft tissue gas or findings of osteomyelitis. 2. Multifocal osteoarthritis. Electronically Signed   By: Narda Rutherford M.D.   On: 07/15/2022 19:39   DG Chest Port 1 View  Result Date: 07/15/2022 CLINICAL DATA:  Shortness of breath. EXAM: PORTABLE CHEST 1 VIEW COMPARISON:  Chest radiograph dated 05/07/2022. FINDINGS: Shallow inspiration. Small left pleural effusion and left lung base atelectasis or infiltrate as seen on the prior radiograph and CT of 12/15/2021. The right lung is clear. Postsurgical changes of the right upper lobe. No pneumothorax. Stable cardiac silhouette. No acute osseous pathology. Thoracic spine stimulator. IMPRESSION: Small left pleural effusion and left lung base atelectasis or infiltrate. Electronically Signed   By: Elgie Collard M.D.   On: 07/15/2022 19:35    Pending Labs Unresulted Labs (From admission, onward)     Start     Ordered   07/16/22 0500  CBC  Tomorrow morning,   R        07/16/22 0240   07/16/22 0500  Basic metabolic panel  Tomorrow morning,   R        07/16/22 0240   07/16/22 0241  Hemoglobin A1c  Once,   R        07/16/22 0240   07/15/22 1805  Urinalysis, w/ Reflex to Culture (Infection Suspected) -Urine, Clean Catch  Once,   URGENT       Question:  Specimen Source  Answer:  Urine, Clean Catch   07/15/22 1804   07/15/22 1804  Culture, blood (routine x 2)  BLOOD CULTURE X 2,   R      07/15/22 1804            Vitals/Pain Today's Vitals   07/15/22 2100 07/15/22 2300 07/16/22 0126 07/16/22 0130  BP:   (!) 145/69 139/69  Pulse:   70 61  Resp: 19 12 (!) 21 16  Temp:   98.4 F (36.9 C)   TempSrc:   Oral   SpO2:   95% 93%  Height:      PainSc:        Isolation Precautions No active isolations  Medications Medications  buprenorphine  (SUBUTEX) SL tablet 2 mg (has no administration in time range)  calcitRIOL (ROCALTROL) capsule 0.25 mcg (has no administration in time range)  busPIRone (BUSPAR) tablet 20 mg (has no administration in time range)  calcium acetate (PHOSLO) capsule 667 mg (has no administration in time range)  escitalopram (LEXAPRO) tablet 20 mg (has no administration in time range)  levothyroxine (SYNTHROID) tablet 250 mcg (has no administration in time range)  midodrine (PROAMATINE) tablet 10 mg (has no administration in time range)  oxyCODONE (Oxy IR/ROXICODONE) immediate release tablet 2.5 mg (has no administration in time range)  pantoprazole (PROTONIX) EC tablet 40 mg (has no administration in time range)  rosuvastatin (CRESTOR) tablet 10 mg (has no administration in time range)  acetaminophen (TYLENOL) tablet 650 mg (has no administration in time range)    Or  acetaminophen (TYLENOL)  suppository 650 mg (has no administration in time range)  busPIRone (BUSPAR) tablet 10 mg (has no administration in time range)  vancomycin (VANCOREADY) IVPB 2000 mg/400 mL (0 mg Intravenous Stopped 07/16/22 0148)  piperacillin-tazobactam (ZOSYN) IVPB 2.25 g (0 g Intravenous Stopped 07/16/22 0148)    Mobility non-ambulatory     Focused Assessments    R Recommendations: See Admitting Provider Note  Report given to:   Additional Notes:

## 2022-07-16 NOTE — Progress Notes (Signed)
Pharmacy Antibiotic Note  Chase [name] is a 72 y.o. male admitted on 07/15/2022 with wound infection.  Pharmacy has been consulted for Zosyn and vancomycin dosing. Patient previously on doxycycline due to gangrenous finger. 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   Plan: Zosyn 2.25g IV every 8 hours Vancomycin 1000 mg post-HD Follow up plans for HD and confirm TTS schedule prior to vancomycin dose Monitor for signs of clinical improvement, surgical plans, fever curve, and WBC  Height: 5\' 6"  (167.6 cm) IBW/kg (Calculated) : 63.8  Temp (24hrs), Avg:98.5 F (36.9 C), Min:98.4 F (36.9 C), Max:98.6 F (37 C)  Recent Labs  Lab 07/15/22 1851 07/15/22 1907  WBC 7.9  --   CREATININE 2.91* 3.20*  LATICACIDVEN 1.2  --     CrCl cannot be calculated (Unknown ideal weight.).    Allergies  Allergen Reactions   Gabapentin     Cognitive impairment per spouse   Lipitor [Atorvastatin]     Severe muscle cramps per spouse   Nsaids Other (See Comments)    Severe renal impairment per [facility]   Pregabalin     Cognitive impairment. Dizziness.    Antimicrobials this admission: Zosyn 7/9 >>  Vancomycin 7/9 >>   Microbiology results: 7/9 BCx:   Thank you for allowing pharmacy to be a part of this patient's care.  Arabella Merles, PharmD. Clinical Pharmacist 07/16/2022 3:07 AM

## 2022-07-16 NOTE — Progress Notes (Signed)
Transition of Care [facility]) - Inpatient Brief Assessment   Patient Details  Name: Chase [name] MRN: [id number] Date of Birth: [birth year]  Transition of Care [facility]) CM/SW Contact:    Janae Bridgeman, RN Phone Number: 07/16/2022, 3:23 PM   Clinical Narrative: Patient was admitted to the hospital from [facility] with infection/ gangrene of finger.  The patient is planned for amputation of finger tomorrow with Dr. Frazier Butt, [facility] Team will continue to follow the patient for [facility] needs and likely return to the facility once medically stable for discharge.  The patient has wife that is involved with his care.   Transition of Care Asessment: Insurance and Status: (P) Insurance coverage has been reviewed Patient has primary care physician: (P) Yes Home environment has been reviewed: (P) Yes - Patient is resident at [facility] Prior level of function:: (P) Facility resident at [employer] [facility] [employer] [facility]: (P)  (Resides at [facility] facility) Social Determinants of Health Reivew: (P) SDOH reviewed needs interventions Readmission risk has been reviewed: (P) Yes Transition of care needs: (P) transition of care needs identified, TOC will continue to follow

## 2022-07-16 NOTE — Progress Notes (Addendum)
72 year old gentleman with a history of ESRD on HD, TTS schedule, CAD and hypothyroidism and hypertension and multiple other comorbidities who was admitted basically due to acute encephalopathy as well as right ring finger dry gangrene.  He has been seen by hand surgery and he is a scheduled to have surgery tomorrow.  Will continue antibiotics in the meantime.  He was also incidentally found to have abdominal hemoperitoneum/abdominal wall hematoma at the site of recent PD catheter removal, case was discussed with vascular surgery as well as general surgery, they did not think that he needs any sort of surgery.  Patient's hemoglobin has remained stable.  I went to see this patient in the room, his wife was at the bedside.  Patient was sleeping a sound.  Wife did not want me to wake him up. I had informed his wife that Dr. Frazier Butt of hand surgery had sent me a message this morning informing me that patient is scheduled for surgery tomorrow morning.  I have consulted nephrology as well.  We will continue current management.

## 2022-07-16 NOTE — Plan of Care (Signed)

## 2022-07-16 NOTE — H&P (Signed)
History and Physical    Chase [name] ZOX:[id number] DOB: 10-20-1950 DOA: 07/15/2022  PCP: [name], MD  Patient coming from: [facility] health  Chief Complaint: Altered mental status  HPI: Chase [name] is a 72 y.o. male with medical history significant of ESRD on HD TTS, CAD, hypothyroidism, hypertension, anemia of chronic disease, depression/anxiety, GERD, type 2 diabetes, chronic back and neck pain.  Patient was admitted to [facility] at the beginning of May due to uremia and was transition from PD to HD.  Admitted here 6/17-6/26 for ischemic right ring finger with dry gangrene.  Underwent aortogram with right upper extremity arteriogram and right radial artery angioplasty on 6/19.  Discharged on aspirin and Plavix.  PD catheter was removed on 6/24 by vascular surgery.  Patient had transient hypotension with HD during this admission and Demadex was discontinued and he was placed on midodrine 10 mg with hemodialysis on TTS.  Patient received 2 units PRBCs during this admission for worsening anemia but no overt bleeding.  Patient presents to the ED from [facility] health for evaluation of increasing confusion and generalized weakness x 2 days.  He is on doxycycline for his gangrenous right ring finger.  Slightly hypertensive on arrival, afebrile and not tachycardic.  Labs showing no leukocytosis, hemoglobin 9.5 (improved), potassium 4.8, bicarb 25, lactate normal, normal LFTs, blood cultures drawn, ammonia level pending. Troponin 34> 40.  EKG showing borderline T wave abnormalities.  Chest x-ray showing small left pleural effusion and left lung base atelectasis or infiltrate.  X-ray of right hand negative for soft tissue gas or findings of osteomyelitis.  CT head negative for acute intracranial abnormality. Exam was concerning for infection of his gangrenous right ring finger.  Patient was given vancomycin and Zosyn.  ED physician discussed the case with Dr. Frazier Butt, hand surgery will consult in  the morning.    CT chest abdomen pelvis was done to rule out other infectious pathology.  CT result as mentioned below: "IMPRESSION: 1. Left mid to lower abdominal hemoperitoneum, most likely from the left rectus sheath/sheath as this is where the peritoneal dialysis catheter entered and was recently removed. The largest amount is in a hematoma along the inner surface of the left abdominal wall. 2. Small volume of free hemorrhagic content in the distal paracolic gutters and pelvis. 3. Likely cystitis. 4. Small loculated left posterior basal pleural effusion with adjacent atelectasis or consolidation, unchanged. 5. Stable 8 mm right lower lobe nodule back to 01/23/2021. Attention on follow-up scans recommended. 6. Cardiomegaly with aortic and coronary artery atherosclerosis. Chronic prominence of the pulmonary trunk. 7. Stable borderline prominent mediastinal lymph nodes. 8. Increased intrahepatic and extrahepatic biliary dilatation post cholecystectomy, with cystic duct remnant and common bile duct both packed with numerous subcentimeter rim calcified stones. 9. Bilateral nephrolithiasis and medullary nephrocalcinosis with 1.2 cm right renal pelvis/UPJ stone without hydronephrosis. 10. Chronic thickening in the sigmoid colon without overt inflammatory reaction. Diffuse diverticulosis. 11. Increased rectal fecal retention. No findings of stercoral proctitis. 12. Osteopenia and degenerative/DISH change. Chronic L2 compression fracture."  Regarding incidentally noted abdominal hemoperitoneum at the site of recent PD catheter removal, ED physician discussed the case with Dr. Janee Morn with general surgery and Dr. Randie Heinz with vascular surgery who felt that there no clear indication for surgical intervention at this time given no surgical abdomen on exam.  Patient appears slightly confused.  History provided mostly by his wife at bedside who is concerned that the patient has been increasingly  more confused in the  past 3 days.  He has been receiving doxycycline for his gangrenous right hand ring finger and was supposed to see his hand surgeon today to discuss amputation.  He has complained of pain and burning with urination.  Patient denies fevers, chills, cough, shortness of breath, chest pain, nausea, vomiting, abdominal pain, or diarrhea.  Review of Systems:  Review of Systems  All other systems reviewed and are negative.   Past Medical History:  Diagnosis Date   Back pain with radiation    Coronary artery disease    Depression    Diabetes mellitus without complication (HCC)    Dialysis patient (HCC)    M, W, F   History of traumatic head injury    Hypertension    Renal disorder    Thyroid disease     Past Surgical History:  Procedure Laterality Date   CAPD REMOVAL N/A 06/30/2022   Procedure: REMOVAL CONTINUOUS AMBULATORY PERITONEAL DIALYSIS  (CAPD) CATHETER;  Surgeon: Victorino Sparrow, MD;  Location: [facility] OR;  Service: Vascular;  Laterality: N/A;   ERCP     PERIPHERAL VASCULAR BALLOON ANGIOPLASTY Right 06/25/2022   Procedure: PERIPHERAL VASCULAR BALLOON ANGIOPLASTY;  Surgeon: Cephus Shelling, MD;  Location: [facility] INVASIVE CV LAB;  Service: Cardiovascular;  Laterality: Right;  Radial   UPPER EXTREMITY ANGIOGRAPHY N/A 06/25/2022   Procedure: Upper Extremity Angiography;  Surgeon: Cephus Shelling, MD;  Location: [facility] INVASIVE CV LAB;  Service: Cardiovascular;  Laterality: N/A;     reports that he has never smoked. He has never used smokeless tobacco. He reports that he does not drink alcohol and does not use drugs.  Allergies  Allergen Reactions   Gabapentin     Cognitive impairment per spouse   Lipitor [Atorvastatin]     Severe muscle cramps per spouse   Pregabalin     Cognitive impairment. Dizziness.    History reviewed. No pertinent family history.  Prior to Admission medications   Medication Sig Start Date End Date Taking? Authorizing Provider   acetaminophen (TYLENOL) 500 MG tablet Take 1,000 mg by mouth every 8 (eight) hours as needed for moderate pain or mild pain.    [provider]  albuterol (VENTOLIN HFA) 108 (90 Base) MCG/ACT inhaler Inhale 2 puffs into the lungs every 4 (four) hours as needed for wheezing or shortness of breath.    [provider]  aspirin EC 81 MG tablet Take 1 tablet (81 mg total) by mouth daily. Swallow whole. 07/02/22   Rodolph Bong, MD  buprenorphine (SUBUTEX) 2 MG SUBL SL tablet Place 1 tablet (2 mg total) under the tongue at bedtime. 07/01/22   Rodolph Bong, MD  busPIRone (BUSPAR) 10 MG tablet Take 1-2 tablets (10-20 mg total) by mouth See admin instructions. Take 2 tablets in the AM and 1 tablet in the PM. 07/01/22   Rodolph Bong, MD  calcitRIOL (ROCALTROL) 0.25 MCG capsule Take 0.25 mcg by mouth daily.    [provider]  Calcium Acetate 667 MG TABS Take 1 tablet by mouth 3 (three) times daily.    [provider]  clopidogrel (PLAVIX) 75 MG tablet Take 1 tablet (75 mg total) by mouth daily. 07/02/22   Rodolph Bong, MD  Darbepoetin Alfa (ARANESP) 100 MCG/0.5ML SOSY injection Inject 0.5 mLs (100 mcg total) into the skin every Thursday at 6pm. 07/03/22   Rodolph Bong, MD  escitalopram (LEXAPRO) 20 MG tablet Take 20 mg by mouth daily.    [provider]  hydrOXYzine (ATARAX) 10 MG tablet Take 1 tablet (10 mg total) by mouth 3 (three) times daily as needed for anxiety. 07/01/22   Rodolph Bong, MD  levothyroxine (SYNTHROID) 125 MCG tablet Take 250 mcg by mouth daily before breakfast.    [provider]  lidocaine (LMX) 4 % cream Apply 1 Application topically once a week. 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    [provider]  midodrine (PROAMATINE) 10 MG tablet Take 1 tablet (10 mg total) by mouth Every  Tuesday,Thursday,and Saturday with dialysis. 07/03/22   Rodolph Bong, MD  Nutritional Supplements (ADULT NUTRITIONAL SUPPLEMENT + PO) Take 60 mLs by mouth 2 (two) times daily.    [provider]  oxyCODONE (OXY IR/ROXICODONE) 5 MG immediate release tablet Take 1 tablet (5 mg total) by mouth every 4 (four) hours as needed for moderate pain. 07/01/22   Rodolph Bong, MD  pantoprazole (PROTONIX) 40 MG tablet Take 40 mg by mouth 2 (two) times daily. For GERD    [provider]  rosuvastatin (CRESTOR) 10 MG tablet Take 10 mg by mouth at bedtime.    [provider]    Physical Exam: Vitals:   07/15/22 1900 07/15/22 2000 07/15/22 2100 07/15/22 2300  BP: (!) 158/82 (!) 149/89    Pulse:      Resp:  19 19 12   Temp:      TempSrc:      SpO2:      Height:        Physical Exam Vitals reviewed.  Constitutional:      General: He is not in acute distress. HENT:     Head: Normocephalic and atraumatic.  Eyes:     Extraocular Movements: Extraocular movements intact.  Cardiovascular:     Rate and Rhythm: Normal rate and regular rhythm.     Pulses: Normal pulses.  Pulmonary:     Effort: Pulmonary effort is normal. No respiratory distress.     Breath sounds: No wheezing or rales.  Abdominal:     General: Bowel sounds are normal. There is no distension.     Palpations: Abdomen is soft.     Tenderness: There is no abdominal tenderness. There is no guarding.     Comments: Abdomen soft and nontender to palpation.  No guarding or rigidity.  Musculoskeletal:     Cervical back: Normal range of motion.     Right lower leg: No edema.     Left lower leg: No edema.     Comments: Right hand: Radial pulse intact.  Ring finger gangrenous with erythema streaking up proximally.  Skin:    General: Skin is warm and dry.  Neurological:     General: No focal deficit present.     Mental Status: He is alert and oriented to person, place, and time.     Cranial Nerves: No cranial  nerve deficit.     Sensory: No sensory deficit.     Motor: No weakness.      Labs on Admission: I have personally reviewed following labs and imaging studies  CBC: Recent Labs  Lab 07/15/22 1851 07/15/22 1907 07/15/22 1908  WBC 7.9  --   --   NEUTROABS 5.7  --   --   HGB 9.5* 10.2* 10.5*  HCT 30.6* 30.0* 31.0*  MCV 99.4  --   --  PLT 185  --   --    Basic Metabolic Panel: Recent Labs  Lab 07/15/22 1851 07/15/22 1907 07/15/22 1908  NA 139 137 137  K 4.8 4.7 4.8  CL 100 100  --   CO2 25  --   --   GLUCOSE 100* 97  --   BUN 16 20  --   CREATININE 2.91* 3.20*  --   CALCIUM 8.7*  --   --    GFR: CrCl cannot be calculated (Unknown ideal weight.). Liver Function Tests: Recent Labs  Lab 07/15/22 1851  AST 23  ALT 6  ALKPHOS 112  BILITOT 1.1  PROT 6.3*  ALBUMIN 2.5*   No results for input(s): "LIPASE", "AMYLASE" in the last 168 hours. No results for input(s): "AMMONIA" in the last 168 hours. Coagulation Profile: Recent Labs  Lab 07/15/22 1851  INR 1.2   Cardiac Enzymes: Recent Labs  Lab 07/15/22 1851  CKTOTAL 151   BNP (last 3 results) No results for input(s): "PROBNP" in the last 8760 hours. HbA1C: No results for input(s): "HGBA1C" in the last 72 hours. CBG: No results for input(s): "GLUCAP" in the last 168 hours. Lipid Profile: No results for input(s): "CHOL", "HDL", "LDLCALC", "TRIG", "CHOLHDL", "LDLDIRECT" in the last 72 hours. Thyroid Function Tests: No results for input(s): "TSH", "T4TOTAL", "FREET4", "T3FREE", "THYROIDAB" in the last 72 hours. Anemia Panel: No results for input(s): "VITAMINB12", "FOLATE", "FERRITIN", "TIBC", "IRON", "RETICCTPCT" in the last 72 hours. Urine analysis:    Component Value Date/Time   COLORURINE STRAW (A) 06/27/2022 1351   APPEARANCEUR CLEAR 06/27/2022 1351   LABSPEC 1.006 06/27/2022 1351   PHURINE 9.0 (H) 06/27/2022 1351   GLUCOSEU 150 (A) 06/27/2022 1351   HGBUR LARGE (A) 06/27/2022 1351   BILIRUBINUR  NEGATIVE 06/27/2022 1351   KETONESUR NEGATIVE 06/27/2022 1351   PROTEINUR 30 (A) 06/27/2022 1351   NITRITE NEGATIVE 06/27/2022 1351   LEUKOCYTESUR MODERATE (A) 06/27/2022 1351    Radiological Exams on Admission: CT CHEST ABDOMEN PELVIS WO CONTRAST  Result Date: 07/15/2022 CLINICAL DATA:  Nonlocalized abdominal pain. History of GIST of the stomach, left nephrectomy, cholecystectomy. Recent removal of peritoneal dialysis catheter 06/30/2022. EXAM: CT CHEST, ABDOMEN AND PELVIS WITHOUT CONTRAST TECHNIQUE: Multidetector CT imaging of the chest, abdomen and pelvis was performed following the standard protocol without IV contrast. RADIATION DOSE REDUCTION: This exam was performed according to the departmental dose-optimization program which includes automated exposure control, adjustment of the mA and/or kV according to patient size and/or use of iterative reconstruction technique. COMPARISON:  Portable chest today, portable chest 05/07/2022, chest, abdomen and pelvis CTs with contrast 04/11/2022 and 10/21/2021. FINDINGS: CT CHEST FINDINGS Cardiovascular: Stable cardiomegaly with patchy three-vessel calcific CAD. No substantial pericardial effusion. Chronically prominent pulmonary trunk measuring 2.9 cm is unchanged. There are heavy calcifications and mild tortuosity of the thoracic aorta, patchy calcific plaques in the great vessels. There is no aortic aneurysm. There are calcifications along the aortic valve leaflets as well. Pulmonary veins are normal caliber. Mediastinum/Nodes: There are multiple borderline prominent anterior and middle mediastinal lymph nodes, fever 2 slightly prominent, largest is a subcarinal lymph node measuring 1 cm in short axis. There is no new or enlarging adenopathy. The thyroid gland, axillary spaces are unremarkable. The thoracic esophagus, thoracic trachea, and main bronchi are unremarkable. Lungs/Pleura: There is a small left posterior basal pleural effusion, which now appears  loculated. Left lower lobe base adjacent atelectasis or consolidation which is unchanged in appearance. The lungs exhibit scattered linear scar-like  opacities, likely postsurgical scarring in the right upper lobe, with a chronically elevated right diaphragm. There is a stable 8 mm noncalcified right lower lobe nodule on 5:85. Stable back to 01/23/2021. Left lower lobe volume loss again is suggested adjacent the pleural effusion. Central airways are patent. No new abnormality in the lung fields. Musculoskeletal: Extensive thoracic spine bridging enthesopathy no acute or other significant osseous findings. Spinal cord stimulator wiring again terminates at T6 and separately at T11. There is bridging enthesopathy along lower thoracic spinous processes. Mild gynecomastia. CT ABDOMEN PELVIS FINDINGS Hepatobiliary: Subcentimeter nonspecific hypodensity is again noted inferiorly in segment 6. No other focal liver abnormality is seen without contrast. There is increased intrahepatic and extrahepatic biliary dilatation post cholecystectomy, with common bile duct now 2 cm. The cystic duct remnant and the common bile duct are both packed with numerous subcentimeter rim calcified stones. I believe these were present previously at least on the last CT, but there was pneumobilia on that study confusing the picture. Pancreas: No focal abnormality or ductal dilatation is seen without contrast. Unchanged periampullary duodenal diverticulum. Spleen: There are calcified hilar arterial plaques. No splenomegaly. No other focal abnormality. Adrenals/Urinary Tract: Left nephrectomy. 1.1 cm hyperdense cyst upper pole right kidney. There are scattered collecting system stones, medullary nephrocalcinosis and a 1.2 cm right renal pelvis/UPJ stone without hydronephrosis which I believe was also present on the last CT. Collecting system stones measure up to 2 cm. There are additional too small to characterize renal hypodensities in a few small  cysts. There is no adrenal mass. No suspicious renal lesion without contrast. No right ureteral stone. There is mild bladder thickening and perivesical haziness consistent with cystitis. Stomach/Bowel: Somewhat elongated stomach is unchanged in appearance with mild thickened folds proximally. There is no small bowel obstruction or inflammation. There is advanced colonic diverticulosis. Chronic thickening in the wall of the sigmoid colon is unchanged, without overt inflammatory reaction. Increased rectal fecal retention is seen without findings of stercoral proctitis. Vascular/Lymphatic: There is heavy aortoiliac and visceral arterial calcific plaque no AAA. No adenopathy is seen. Reproductive: Prostate is normal in size. Other: There is hemoperitoneum with irregular Hematoma measuring 37-45 Hounsfield units collecting adjacent the left rectus sheath in the mid to lower abdomen measuring as much as 12 cm coronal, 3.4 cm AP and extending for a craniocaudal length up to 10 cm. There is a small volume of free hemorrhagic content in the distal paracolic gutters and pelvis. The hemorrhagic source is most likely from the left rectus muscle/sheath as this is where the peritoneal dialysis catheter entered. There is mild body wall anasarca which was not seen previously. There is no free air or abscess. Mild mesenteric congestive features appear similar. Musculoskeletal: There is osteopenia with degenerative changes of the lumbar spine and chronic compression fracture of L2, moderate hip DJD. No destructive osseous or lytic lesions. In addition to bridging enthesopathy there are bridging syndesmophytes primarily of the thoracic spine. IMPRESSION: 1. Left mid to lower abdominal hemoperitoneum, most likely from the left rectus sheath/sheath as this is where the peritoneal dialysis catheter entered and was recently removed. The largest amount is in a hematoma along the inner surface of the left abdominal wall. 2. Small volume of  free hemorrhagic content in the distal paracolic gutters and pelvis. 3. Likely cystitis. 4. Small loculated left posterior basal pleural effusion with adjacent atelectasis or consolidation, unchanged. 5. Stable 8 mm right lower lobe nodule back to 01/23/2021. Attention on follow-up scans recommended. 6. Cardiomegaly  with aortic and coronary artery atherosclerosis. Chronic prominence of the pulmonary trunk. 7. Stable borderline prominent mediastinal lymph nodes. 8. Increased intrahepatic and extrahepatic biliary dilatation post cholecystectomy, with cystic duct remnant and common bile duct both packed with numerous subcentimeter rim calcified stones. 9. Bilateral nephrolithiasis and medullary nephrocalcinosis with 1.2 cm right renal pelvis/UPJ stone without hydronephrosis. 10. Chronic thickening in the sigmoid colon without overt inflammatory reaction. Diffuse diverticulosis. 11. Increased rectal fecal retention. No findings of stercoral proctitis. 12. Osteopenia and degenerative/DISH change. Chronic L2 compression fracture. 13. Critical Value/emergent results were called by telephone at the time of interpretation on 07/15/2022 at 11:27 pm to provider [facility] , who verbally acknowledged these results. Electronically Signed   By: Almira Bar M.D.   On: 07/15/2022 23:37   CT Head Wo Contrast  Result Date: 07/15/2022 CLINICAL DATA:  Mental status change EXAM: CT HEAD WITHOUT CONTRAST TECHNIQUE: Contiguous axial images were obtained from the base of the skull through the vertex without intravenous contrast. RADIATION DOSE REDUCTION: This exam was performed according to the departmental dose-optimization program which includes automated exposure control, adjustment of the mA and/or kV according to patient size and/or use of iterative reconstruction technique. COMPARISON:  CT 12/15/2021 FINDINGS: Brain: No evidence of acute infarction, hemorrhage, hydrocephalus, extra-axial collection or mass lesion/mass effect.  There is moderate diffuse atrophy. There is moderate patchy periventricular and deep white matter hypodensity, likely chronic small vessel ischemic change. This is similar to prior. 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 Vascular: There are atherosclerotic calcifications of the intracranial internal carotid arteries, vertebral arteries and peripheral vasculature. Skull: Normal. Negative for fracture or focal lesion. Sinuses/Orbits: No acute finding. Other: None. IMPRESSION: 1. No acute intracranial process. 2. Moderate chronic small vessel ischemic change and atrophy. Electronically Signed   By: Darliss Cheney M.D.   On: 07/15/2022 22:52   DG Hand Complete Right  Result Date: 07/15/2022 CLINICAL DATA:  Pain.  Necrotic fourth digit. EXAM: RIGHT HAND - COMPLETE 3+ VIEW COMPARISON:  Hand radiograph 06/04/2022 FINDINGS: The soft tissues of the fourth digit distally are atrophic at the level of the distal phalanx and distal aspect of the proximal phalanx, new from prior exam. No soft tissue gas. There are extensive vascular calcifications. IV catheter is seen overlying the dorsum of the wrist. Multifocal osteoarthritis. No fracture. No erosive or bony destructive change or periostitis. IMPRESSION: 1. Atrophic soft tissues of the fourth digit distally at the level of the distal phalanx and distal aspect of the proximal phalanx, new from prior exam. No soft tissue gas or findings of osteomyelitis. 2. Multifocal osteoarthritis. Electronically Signed   By: Narda Rutherford M.D.   On: 07/15/2022 19:39   DG Chest Port 1 View  Result Date: 07/15/2022 CLINICAL DATA:  Shortness of breath. EXAM: PORTABLE CHEST 1 VIEW COMPARISON:  Chest radiograph dated 05/07/2022. FINDINGS: Shallow inspiration. Small left pleural effusion and left lung base atelectasis or infiltrate as seen on the prior radiograph and CT of 12/15/2021. The right lung is clear. Postsurgical changes of the right upper lobe. No pneumothorax.  Stable cardiac silhouette. No acute osseous pathology. Thoracic spine stimulator. IMPRESSION: Small left pleural effusion and left lung base atelectasis or infiltrate. Electronically Signed   By: Elgie Collard M.D.   On: 07/15/2022 19:35    EKG: Independently reviewed.  Sinus rhythm, borderline T wave abnormalities.  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  Radial pulse currently palpable.  He is on doxycycline and exam concerning for erythema spreading proximally from the gangrenous finger.  No fever, leukocytosis, or lactic acidosis.  X-ray negative for soft tissue gas or findings of osteomyelitis.  Patient will need amputation of the finger and hand surgery has been consulted.  Keep n.p.o. Continue vancomycin and Zosyn.  Blood cultures pending.  Acute metabolic encephalopathy Likely due to infection.  CT head negative for acute intracranial abnormality and no focal neurodeficit on exam.  Ammonia level normal.  Patient appears only slightly confused at this time, monitor closely.  Abdominal hemoperitoneum/abdominal wall hematoma at the site of recent PD catheter removal Incidentally noted on CT.  Patient is hemodynamically stable.  Abdominal exam benign and hemoglobin improved.  ED physician discussed the case with Dr. Janee Morn with general surgery and Dr. Randie Heinz with vascular surgery who felt that there no clear indication for surgical intervention at this time given no surgical abdomen on exam.  Will hold aspirin and Plavix.  Type and screen, monitor H&H.  Possible cystitis Patient has complained of dysuria and CT showing likely cystitis.  Continue antibiotics, UA pending.  Elevated troponin EKG showing borderline T wave abnormalities.  Troponin 34> 40.  ACS less likely as patient is not having any chest pain.  Continue to trend troponin.  Small  loculated left posterior basal pleural effusion with adjacent atelectasis or consolidation Seen on CT and appears unchanged.  Patient is not endorsing any respiratory symptoms.  Not hypoxic.  8 mm right lower lobe pulmonary nodule Seen on CT and appears stable compared to previous scan from January 2023.  Patient will need outpatient follow-up.  ESRD on HD TTS No indication for urgent dialysis overnight.  Consult nephrology in the morning.  CAD PVD Hold aspirin and Plavix given abdominal hemoperitoneum/abdominal wall hematoma.  Continue Crestor.  Hypothyroidism Continue Synthroid  Depression/anxiety Continue BuSpar and Lexapro  GERD Continue Protonix.  Type 2 diabetes? Not on medications.  A1c 5.5 in October 2022, no previous values for comparison.  Repeat A1c ordered.  Chronic back and neck pain Continue buprenorphine and Oxy IR PRN  DVT prophylaxis: SCDs Code Status: Full Code (discussed with the patient and his wife) Family Communication: Wife at bedside Level of care: Telemetry bed Admission status: It is my clinical opinion that admission to INPATIENT is reasonable and necessary because of the expectation that this patient will require hospital care that crosses at least 2 midnights to treat this condition based on the medical complexity of the problems presented.  Given the aforementioned information, the predictability of an adverse outcome is felt to be significant.   John Giovanni MD [facility]  If 7PM-7AM, please contact night-coverage [email or web address]  07/16/2022, 1:12 AM

## 2022-07-16 NOTE — Consult Note (Addendum)
Renal Service Consult Note [facility]  Ferrin Creach 07/16/2022 Maree Krabbe, MD Requesting Physician: Dr. Jacqulyn Bath  Reason for Consult: ESRD pt w/ AMS HPI: The patient is a 72 y.o. year-old w/ PMH as below who presented to ED yesterday evening w/ AMS sent from SNF. In ED BP's were up, afeb , normal HR. WBC ok, Hb 9.5, K 4.8, lactate wnl. BCx's drawn. CXR showed L effusion and L base infiltrate vs atx. Pt had gangrene of a finger on the R hand. IV abx were started. CT head was negative. We are asked to see for dialysis.   Pt on HD TTS. He had PD catheter removed on 06/30/22 while here for same issue w/ ischemic R hand finger.  Pt seen in room. He is confused. The pt's wife provided history.   Pt started PD about 4 yrs ago. The PD cath was removed recently after it stopped working. He is in a facility at [facility] now doing HD TTS w/ the [facility] in [place] on [address]     Linus Orn - denies CP, no joint pain, no HA, no blurry vision, no rash, no diarrhea, no nausea/ vomiting, no dysuria, no difficulty voiding   Past Medical History  Past Medical History:  Diagnosis Date   Anemia    Back pain with radiation    Coronary artery disease    Depression    Diabetes mellitus without complication (HCC)    Dialysis patient (HCC)    M, W, F   History of traumatic head injury    Hypertension    Renal disorder    Thyroid disease    Past Surgical History  Past Surgical History:  Procedure Laterality Date   CAPD REMOVAL N/A 06/30/2022   Procedure: REMOVAL CONTINUOUS AMBULATORY PERITONEAL DIALYSIS  (CAPD) CATHETER;  Surgeon: Victorino Sparrow, MD;  Location: [facility] OR;  Service: Vascular;  Laterality: N/A;   ERCP     PERIPHERAL VASCULAR BALLOON ANGIOPLASTY Right 06/25/2022   Procedure: PERIPHERAL VASCULAR BALLOON ANGIOPLASTY;  Surgeon: Cephus Shelling, MD;  Location: [facility] INVASIVE CV LAB;  Service: Cardiovascular;  Laterality: Right;  Radial   UPPER EXTREMITY ANGIOGRAPHY  N/A 06/25/2022   Procedure: Upper Extremity Angiography;  Surgeon: Cephus Shelling, MD;  Location: [facility] INVASIVE CV LAB;  Service: Cardiovascular;  Laterality: N/A;   Family History History reviewed. No pertinent family history. Social History  reports that he has never smoked. He has never used smokeless tobacco. He reports that he does not drink alcohol and does not use drugs. Allergies  Allergies  Allergen Reactions   Gabapentin     Cognitive impairment per spouse   Lipitor [Atorvastatin]     Severe muscle cramps per spouse   Nsaids Other (See Comments)    Severe renal impairment per [facility]   Pregabalin     Cognitive impairment. Dizziness.   Home medications Prior to Admission medications   Medication Sig Start Date End Date Taking? Authorizing Provider  acetaminophen (TYLENOL) 500 MG tablet Take 1,000 mg by mouth every 8 (eight) hours as needed for moderate pain or mild pain.   Yes [provider]  albuterol (VENTOLIN HFA) 108 (90 Base) MCG/ACT inhaler Inhale 2 puffs into the lungs every 4 (four) hours as needed for wheezing or shortness of breath.   Yes [provider]  aspirin EC 81 MG tablet Take 1 tablet (81 mg total) by mouth daily. Swallow whole. 07/02/22  Yes Rodolph Bong, MD  buprenorphine (SUBUTEX) 2 MG SUBL SL tablet Place 1 tablet (2 mg total) under the tongue at bedtime. 07/01/22  Yes Rodolph Bong, MD  busPIRone (BUSPAR) 10 MG tablet Take 1-2 tablets (10-20 mg total) by mouth See admin instructions. Take 2 tablets in the AM and 1 tablet in the PM. Patient taking differently: Take 10-20 mg by mouth See admin instructions. Take 2 tablets by mouth in the morning and 1 tablet in the at bedtime 07/01/22  Yes Rodolph Bong, MD  calcitRIOL (ROCALTROL) 0.25 MCG capsule Take 0.25 mcg by mouth daily.   Yes [provider]  Calcium Acetate 667 MG TABS Take 1 tablet by mouth 3 (three) times daily.   Yes [provider]  clopidogrel  (PLAVIX) 75 MG tablet Take 1 tablet (75 mg total) by mouth daily. 07/02/22  Yes Rodolph Bong, MD  Darbepoetin Alfa (ARANESP) 100 MCG/0.5ML SOSY injection Inject 0.5 mLs (100 mcg total) into the skin every Thursday at 6pm. 07/03/22  Yes Rodolph Bong, MD  doxycycline (VIBRAMYCIN) 100 MG capsule Take 100 mg by mouth 2 (two) times daily. 07/08/22  Yes [provider]  escitalopram (LEXAPRO) 20 MG tablet Take 20 mg by mouth daily.   Yes [provider]  levothyroxine (SYNTHROID) 125 MCG tablet Take 250 mcg by mouth daily before breakfast.   Yes [provider]  lidocaine (LMX) 4 % cream Apply 1 Application topically once a week. Apply to fistula  prior to Dialysis- Tue, Thurs, Sat   Yes [provider]  melatonin 3 MG TABS tablet Take 3 mg by mouth at bedtime.   Yes [provider]  midodrine (PROAMATINE) 10 MG tablet Take 1 tablet (10 mg total) by mouth Every Tuesday,Thursday,and Saturday with dialysis. Patient taking differently: Take 10 mg by mouth Every Tuesday,Thursday,and Saturday with dialysis. May also give 1 tablet for sBP less than 90 07/03/22  Yes Rodolph Bong, MD  oxyCODONE (OXY IR/ROXICODONE) 5 MG immediate release tablet Take 1 tablet (5 mg total) by mouth every 4 (four) hours as needed for moderate pain. Patient taking differently: Take 2.5 mg by mouth every 6 (six) hours as needed for moderate pain. 07/01/22  Yes Rodolph Bong, MD  pantoprazole (PROTONIX) 40 MG tablet Take 40 mg by mouth 2 (two) times daily. For GERD   Yes [provider]  rosuvastatin (CRESTOR) 10 MG tablet Take 10 mg by mouth at bedtime.   Yes [provider]     Vitals:   07/16/22 0300 07/16/22 0429 07/16/22 0443 07/16/22 0754  BP:   (!) 126/42 (!) 189/82  Pulse:   70 73  Resp:   17 17  Temp:    98.6 F (37 C)  TempSrc:      SpO2:   96% 97%  Weight: 90.7 kg 87.6 kg 87.6 kg   Height:  5\' 6"  (1.676 m)     Exam Gen somnolent,  awakens briefly then falls right back asleep No rash, cyanosis or gangrene Sclera anicteric, throat clear  No jvd or bruits Chest clear bilat to bases, no rales/ wheezing RRR no RG Abd soft obese ntnd no mass or ascites +bs GU normal male MS no joint effusions or deformity Ext no LE or UE edema, no wounds or ulcers Neuro as above     LFA AVF +bruit     Home meds include - albuterol, aspirin, plavix, buprenorphine, buspirone, phoslo 1 ac tid, lexapro, synthroid, midodrine 10mg  pre hd tts, oxyIR,  protonix, crestor, prns       OP HD: [facility] [place] [facility] [phone]) - no answer    Labs --> Na 139  K 3.8  CO2 25  bun 18  creat 3.31  alb 2.5  Hb 9.1 wbc 7k   Assessment/ Plan: AMS - uncertain cause. CT head negative. Per pmd.  Ischemic/ gangrenous R hand finger #2 - IV abx started per pmd. Going for amputation tomorrow.  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 Last HD yesterday. Plan HD tomorrow.  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  MBD ckd - CCa in range, will add on phos.  Chronic pain syndrome - on buprenorphine PAD - on asa and plavix      Rob Arlean Hopping  MD [facility] 07/16/2022, 1:31 PM  Recent Labs  Lab 07/15/22 1851 07/15/22 1907 07/15/22 1908 07/16/22 0255  HGB 9.5* 10.2* 10.5* 9.1*  ALBUMIN 2.5*  --   --   --   CALCIUM 8.7*  --   --  8.9  CREATININE 2.91* 3.20*  --  3.31*  K 4.8 4.7 4.8 3.8   Inpatient medications:  buprenorphine  2 mg Sublingual QHS   busPIRone  10 mg Oral QHS   busPIRone  20 mg Oral Daily   calcitRIOL  0.25 mcg Oral Daily   calcium acetate  667 mg Oral TID with meals   escitalopram  20 mg Oral Daily   levothyroxine  250 mcg Oral QAC breakfast   [START ON 07/17/2022] midodrine  10 mg Oral Q T,Th,Sa-HD   pantoprazole  40 mg Oral BID   rosuvastatin  10 mg Oral QHS    piperacillin-tazobactam (ZOSYN)  IV 2.25 g (07/16/22 1024)   [START ON 07/17/2022] vancomycin     acetaminophen **OR** acetaminophen,  oxyCODONE

## 2022-07-16 NOTE — H&P (Signed)
Preoperative History & Physical Exam  Surgeon: Philipp Ovens, MD  Diagnosis: Gangrene of right ring finger   Planned Procedure: Procedure(s) (LRB): AMPUTATION OF RING FINGER (Right)  History of Present Illness:   Patient is a 72 y.o. male with symptoms consistent with gangrene of right ring finger who presents for surgical intervention. The risks, benefits and alternatives of surgical intervention were discussed and informed consent was obtained prior to surgery.  Past Medical History:  Past Medical History:  Diagnosis Date   Anemia    Back pain with radiation    Coronary artery disease    Depression    Diabetes mellitus without complication (HCC)    Dialysis patient (HCC)    M, W, F   History of traumatic head injury    Hypertension    Renal disorder    Thyroid disease     Past Surgical History:  Past Surgical History:  Procedure Laterality Date   CAPD REMOVAL N/A 06/30/2022   Procedure: REMOVAL CONTINUOUS AMBULATORY PERITONEAL DIALYSIS  (CAPD) CATHETER;  Surgeon: Victorino Sparrow, MD;  Location: [facility] OR;  Service: Vascular;  Laterality: N/A;   ERCP     PERIPHERAL VASCULAR BALLOON ANGIOPLASTY Right 06/25/2022   Procedure: PERIPHERAL VASCULAR BALLOON ANGIOPLASTY;  Surgeon: Cephus Shelling, MD;  Location: [facility] INVASIVE CV LAB;  Service: Cardiovascular;  Laterality: Right;  Radial   UPPER EXTREMITY ANGIOGRAPHY N/A 06/25/2022   Procedure: Upper Extremity Angiography;  Surgeon: Cephus Shelling, MD;  Location: [facility] INVASIVE CV LAB;  Service: Cardiovascular;  Laterality: N/A;    Medications:  Prior to Admission medications   Medication Sig Start Date End Date Taking? Authorizing Provider  acetaminophen (TYLENOL) 500 MG tablet Take 1,000 mg by mouth every 8 (eight) hours as needed for moderate pain or mild pain.   Yes [provider]  albuterol (VENTOLIN HFA) 108 (90 Base) MCG/ACT inhaler Inhale 2 puffs into the lungs every 4 (four) hours as needed for wheezing or  shortness of breath.   Yes [provider]  aspirin EC 81 MG tablet Take 1 tablet (81 mg total) by mouth daily. Swallow whole. 07/02/22  Yes Rodolph Bong, MD  buprenorphine (SUBUTEX) 2 MG SUBL SL tablet Place 1 tablet (2 mg total) under the tongue at bedtime. 07/01/22  Yes Rodolph Bong, MD  busPIRone (BUSPAR) 10 MG tablet Take 1-2 tablets (10-20 mg total) by mouth See admin instructions. Take 2 tablets in the AM and 1 tablet in the PM. Patient taking differently: Take 10-20 mg by mouth See admin instructions. Take 2 tablets by mouth in the morning and 1 tablet in the at bedtime 07/01/22  Yes Rodolph Bong, MD  calcitRIOL (ROCALTROL) 0.25 MCG capsule Take 0.25 mcg by mouth daily.   Yes [provider]  Calcium Acetate 667 MG TABS Take 1 tablet by mouth 3 (three) times daily.   Yes [provider]  clopidogrel (PLAVIX) 75 MG tablet Take 1 tablet (75 mg total) by mouth daily. 07/02/22  Yes Rodolph Bong, MD  Darbepoetin Alfa (ARANESP) 100 MCG/0.5ML SOSY injection Inject 0.5 mLs (100 mcg total) into the skin every Thursday at 6pm. 07/03/22  Yes Rodolph Bong, MD  doxycycline (VIBRAMYCIN) 100 MG capsule Take 100 mg by mouth 2 (two) times daily. 07/08/22  Yes [provider]  escitalopram (LEXAPRO) 20 MG tablet Take 20 mg by mouth daily.   Yes [provider]  levothyroxine (SYNTHROID) 125 MCG tablet Take 250 mcg by mouth daily  before breakfast.   Yes [provider]  lidocaine (LMX) 4 % cream Apply 1 Application topically once a week. Apply to fistula  prior to Dialysis- Tue, Thurs, Sat   Yes [provider]  melatonin 3 MG TABS tablet Take 3 mg by mouth at bedtime.   Yes [provider]  midodrine (PROAMATINE) 10 MG tablet Take 1 tablet (10 mg total) by mouth Every Tuesday,Thursday,and Saturday with dialysis. Patient taking differently: Take 10 mg by mouth Every Tuesday,Thursday,and Saturday with dialysis. May also  give 1 tablet for sBP less than 90 07/03/22  Yes Rodolph Bong, MD  oxyCODONE (OXY IR/ROXICODONE) 5 MG immediate release tablet Take 1 tablet (5 mg total) by mouth every 4 (four) hours as needed for moderate pain. Patient taking differently: Take 2.5 mg by mouth every 6 (six) hours as needed for moderate pain. 07/01/22  Yes Rodolph Bong, MD  pantoprazole (PROTONIX) 40 MG tablet Take 40 mg by mouth 2 (two) times daily. For GERD   Yes [provider]  rosuvastatin (CRESTOR) 10 MG tablet Take 10 mg by mouth at bedtime.   Yes [provider]    Allergies:  Gabapentin, Lipitor [atorvastatin], Nsaids, and Pregabalin  Review of Systems: Negative except per HPI.  Physical Exam: Alert and oriented, NAD Head and neck: no masses, normal alignment CV: pulse intact Pulm: no increased work of breathing, respirations even and unlabored Abdomen: non-distended Extremities: right ring finger necrotic, right index, middle and small fingers cool   LABS: Recent Results (from the past 2160 hour(s))  CBC with Differential     Status: Abnormal   Collection Time: 06/04/22  1:41 PM  Result Value Ref Range   WBC 8.9 4.0 - 10.5 K/uL   RBC 2.53 (L) 4.22 - 5.81 MIL/uL   Hemoglobin 7.7 (L) 13.0 - 17.0 g/dL   HCT 16.1 (L) 09.6 - 04.5 %   MCV 101.6 (H) 80.0 - 100.0 fL   MCH 30.4 26.0 - 34.0 pg   MCHC 30.0 30.0 - 36.0 g/dL   RDW 40.9 (H) 81.1 - 91.4 %   Platelets 148 (L) 150 - 400 K/uL   nRBC 0.0 0.0 - 0.2 %   Neutrophils Relative % 75 %   Neutro Abs 6.6 1.7 - 7.7 K/uL   Lymphocytes Relative 12 %   Lymphs Abs 1.1 0.7 - 4.0 K/uL   Monocytes Relative 7 %   Monocytes Absolute 0.6 0.1 - 1.0 K/uL   Eosinophils Relative 5 %   Eosinophils Absolute 0.4 0.0 - 0.5 K/uL   Basophils Relative 1 %   Basophils Absolute 0.1 0.0 - 0.1 K/uL   Immature Granulocytes 0 %   Abs Immature Granulocytes 0.03 0.00 - 0.07 K/uL    Comment: Performed at [facility], [address]  [place], [place] [address]  Basic metabolic panel     Status: Abnormal   Collection Time: 06/04/22  1:41 PM  Result Value Ref Range   Sodium 136 135 - 145 mmol/L   Potassium 4.5 3.5 - 5.1 mmol/L   Chloride 99 98 - 111 mmol/L   CO2 27 22 - 32 mmol/L   Glucose, Bld 116 (H) 70 - 99 mg/dL    Comment: Glucose reference range applies only to samples taken after fasting for at least 8 hours.   BUN 33 (H) 8 - 23 mg/dL   Creatinine, Ser 6.21 (H) 0.61 - 1.24 mg/dL   Calcium 8.7 (L) 8.9 - 10.3 mg/dL  GFR, Estimated 15 (L) >60 mL/min    Comment: (NOTE) Calculated using the CKD-EPI Creatinine Equation (2021)    Anion gap 10 5 - 15    Comment: Performed at [facility], [address] [place], [place] [address]  Hepatitis B surface antigen     Status: None   Collection Time: 06/23/22  2:06 PM  Result Value Ref Range   Hepatitis B Surface Ag NON REACTIVE NON REACTIVE    Comment: Performed at [facility], [address], [place], [place] [address]  I-stat chem 8, ED (not at [facility] or [facility])     Status: Abnormal   Collection Time: 06/23/22  5:27 PM  Result Value Ref Range   Sodium 134 (L) 135 - 145 mmol/L   Potassium 4.6 3.5 - 5.1 mmol/L   Chloride 100 98 - 111 mmol/L   BUN 52 (H) 8 - 23 mg/dL   Creatinine, Ser 0.98 (H) 0.61 - 1.24 mg/dL   Glucose, Bld 119 (H) 70 - 99 mg/dL    Comment: Glucose reference range applies only to samples taken after fasting for at least 8 hours.   Calcium, Ion 1.10 (L) 1.15 - 1.40 mmol/L   TCO2 25 22 - 32 mmol/L   Hemoglobin 8.5 (L) 13.0 - 17.0 g/dL   HCT 14.7 (L) 82.9 - 56.2 %  CBC     Status: Abnormal   Collection Time: 06/23/22  5:55 PM  Result Value Ref Range   WBC 8.1 4.0 - 10.5 K/uL   RBC 2.56 (L) 4.22 - 5.81 MIL/uL   Hemoglobin 8.0 (L) 13.0 - 17.0 g/dL   HCT 13.0 (L) 86.5 - 78.4 %   MCV 102.3 (H) 80.0 - 100.0 fL   MCH 31.3 26.0 - 34.0 pg   MCHC 30.5 30.0 - 36.0 g/dL   RDW 69.6 (H) 29.5 - 28.4 %   Platelets 177 150 - 400 K/uL    nRBC 0.0 0.0 - 0.2 %    Comment: Performed at [facility], [address], [place], [place] [address]  Lactic acid, plasma     Status: None   Collection Time: 06/23/22  5:55 PM  Result Value Ref Range   Lactic Acid, Venous 1.1 0.5 - 1.9 mmol/L    Comment: Performed at [facility], [address] [place], [place] [address]  Comprehensive metabolic panel     Status: Abnormal   Collection Time: 06/23/22  5:55 PM  Result Value Ref Range   Sodium 134 (L) 135 - 145 mmol/L   Potassium 4.5 3.5 - 5.1 mmol/L   Chloride 97 (L) 98 - 111 mmol/L   CO2 26 22 - 32 mmol/L   Glucose, Bld 129 (H) 70 - 99 mg/dL    Comment: Glucose reference range applies only to samples taken after fasting for at least 8 hours.   BUN 56 (H) 8 - 23 mg/dL   Creatinine, Ser 2.53 (H) 0.61 - 1.24 mg/dL   Calcium 8.6 (L) 8.9 - 10.3 mg/dL   Total Protein 6.1 (L) 6.5 - 8.1 g/dL   Albumin 2.5 (L) 3.5 - 5.0 g/dL   AST 14 (L) 15 - 41 U/L   ALT 12 0 - 44 U/L   Alkaline Phosphatase 138 (H) 38 - 126 U/L   Total Bilirubin 0.6 0.3 - 1.2 mg/dL   GFR, Estimated 12 (L) >60 mL/min    Comment: (NOTE) Calculated using the CKD-EPI Creatinine Equation (2021)  Anion gap 11 5 - 15    Comment: Performed at [facility], [address], [place], [place] [address]  Protime-INR     Status: Abnormal   Collection Time: 06/23/22  5:55 PM  Result Value Ref Range   Prothrombin Time 16.6 (H) 11.4 - 15.2 seconds   INR 1.3 (H) 0.8 - 1.2    Comment: (NOTE) INR goal varies based on device and disease states. Performed at [facility], [address] [place], [place] [address]   APTT     Status: None   Collection Time: 06/23/22  5:55 PM  Result Value Ref Range   aPTT 36 24 - 36 seconds    Comment: Performed at [facility], [address] [place], [place] [address]  Lactic acid, plasma     Status: None   Collection Time: 06/23/22  9:12 PM   Result Value Ref Range   Lactic Acid, Venous 0.8 0.5 - 1.9 mmol/L    Comment: Performed at [facility], [address], [place], [place] [address]  HIV Antibody (routine testing w rflx)     Status: None   Collection Time: 06/23/22  9:12 PM  Result Value Ref Range   HIV Screen 4th Generation wRfx Non Reactive Non Reactive    Comment: Performed at [facility], [address] [place], [place] [address]  Surgical PCR screen     Status: None   Collection Time: 06/23/22  9:44 PM   Specimen: Nasal Mucosa; Nasal Swab  Result Value Ref Range   MRSA, PCR NEGATIVE NEGATIVE   Staphylococcus aureus NEGATIVE NEGATIVE    Comment: (NOTE) The Xpert SA Assay (FDA approved for NASAL specimens in patients 74 years of age and older), is one component of a comprehensive surveillance program. It is not intended to diagnose infection nor to guide or monitor treatment. Performed at [facility], [address] [place], [place] [address]   Basic metabolic panel     Status: Abnormal   Collection Time: 06/24/22  1:22 AM  Result Value Ref Range   Sodium 135 135 - 145 mmol/L   Potassium 5.0 3.5 - 5.1 mmol/L   Chloride 97 (L) 98 - 111 mmol/L   CO2 25 22 - 32 mmol/L   Glucose, Bld 92 70 - 99 mg/dL    Comment: Glucose reference range applies only to samples taken after fasting for at least 8 hours.   BUN 59 (H) 8 - 23 mg/dL   Creatinine, Ser 5.78 (H) 0.61 - 1.24 mg/dL   Calcium 8.9 8.9 - 46.9 mg/dL   GFR, Estimated 11 (L) >60 mL/min    Comment: (NOTE) Calculated using the CKD-EPI Creatinine Equation (2021)    Anion gap 13 5 - 15    Comment: Performed at [facility], [address], [place], [place] [address]  CBC     Status: Abnormal   Collection Time: 06/24/22  1:22 AM  Result Value Ref Range   WBC 8.5 4.0 - 10.5 K/uL   RBC 2.54 (L) 4.22 - 5.81 MIL/uL   Hemoglobin 8.0 (L) 13.0 - 17.0 g/dL   HCT 84.1 (L) 32.4 - 40.1 %   MCV 101.2 (H) 80.0 - 100.0 fL   MCH 31.5 26.0 -  34.0 pg   MCHC 31.1 30.0 - 36.0 g/dL   RDW 02.7 (H) 25.3 - 66.4 %   Platelets 193 150 - 400 K/uL   nRBC 0.0  0.0 - 0.2 %    Comment: Performed at [facility], [address] [place], [place] [address]  Hepatitis B surface antibody,quantitative     Status: Abnormal   Collection Time: 06/24/22  7:18 PM  Result Value Ref Range   Hep B S AB Quant (Post) 9.9 (L) Immunity>9.9 mIU/mL    Comment: (NOTE)  Status of Immunity                     Anti-HBs Level  ------------------                     -------------- Inconsistent with Immunity                   0.0 - 9.9 Consistent with Immunity                          >9.9 **Effective July 07, 2022 the reference interval**  will be changing to: Immunity >10 Performed At: [facility] [address] [place], [place] [id number] Jolene Schimke MD [id number]   CBC with Differential/Platelet     Status: Abnormal   Collection Time: 06/25/22  2:59 AM  Result Value Ref Range   WBC 7.0 4.0 - 10.5 K/uL   RBC 2.41 (L) 4.22 - 5.81 MIL/uL   Hemoglobin 7.3 (L) 13.0 - 17.0 g/dL   HCT 02.7 (L) 25.3 - 66.4 %   MCV 99.2 80.0 - 100.0 fL   MCH 30.3 26.0 - 34.0 pg   MCHC 30.5 30.0 - 36.0 g/dL   RDW 40.3 (H) 47.4 - 25.9 %   Platelets 188 150 - 400 K/uL   nRBC 0.0 0.0 - 0.2 %   Neutrophils Relative % 72 %   Neutro Abs 5.1 1.7 - 7.7 K/uL   Lymphocytes Relative 15 %   Lymphs Abs 1.0 0.7 - 4.0 K/uL   Monocytes Relative 7 %   Monocytes Absolute 0.5 0.1 - 1.0 K/uL   Eosinophils Relative 6 %   Eosinophils Absolute 0.4 0.0 - 0.5 K/uL   Basophils Relative 0 %   Basophils Absolute 0.0 0.0 - 0.1 K/uL   Immature Granulocytes 0 %   Abs Immature Granulocytes 0.02 0.00 - 0.07 K/uL    Comment: Performed at [facility], [address] [place], [place] [address]  Comprehensive metabolic panel     Status: Abnormal   Collection Time: 06/25/22  2:59 AM  Result Value Ref Range   Sodium 133 (L) 135 - 145 mmol/L   Potassium 5.2 (H) 3.5 - 5.1 mmol/L    Chloride 98 98 - 111 mmol/L   CO2 22 22 - 32 mmol/L   Glucose, Bld 88 70 - 99 mg/dL    Comment: Glucose reference range applies only to samples taken after fasting for at least 8 hours.   BUN 66 (H) 8 - 23 mg/dL   Creatinine, Ser 5.64 (H) 0.61 - 1.24 mg/dL   Calcium 8.3 (L) 8.9 - 10.3 mg/dL   Total Protein 5.8 (L) 6.5 - 8.1 g/dL   Albumin 2.3 (L) 3.5 - 5.0 g/dL   AST 19 15 - 41 U/L   ALT 11 0 - 44 U/L   Alkaline Phosphatase 113 38 - 126 U/L   Total Bilirubin 0.3 0.3 - 1.2 mg/dL   GFR, Estimated 9 (L) >60 mL/min    Comment: (NOTE) Calculated using the CKD-EPI Creatinine Equation (2021)  Anion gap 13 5 - 15    Comment: Performed at [facility], [address], [place], [place] [address]  Magnesium     Status: Abnormal   Collection Time: 06/25/22  2:59 AM  Result Value Ref Range   Magnesium 1.4 (L) 1.7 - 2.4 mg/dL    Comment: Performed at [facility], [address], [place], [place] [address]  Phosphorus     Status: Abnormal   Collection Time: 06/25/22  2:59 AM  Result Value Ref Range   Phosphorus 5.6 (H) 2.5 - 4.6 mg/dL    Comment: Performed at [facility], [address], [place], [place] [address]  Vitamin B12     Status: None   Collection Time: 06/25/22  2:59 AM  Result Value Ref Range   Vitamin B-12 894 180 - 914 pg/mL    Comment: (NOTE) This assay is not validated for testing neonatal or myeloproliferative syndrome specimens for Vitamin B12 levels. Performed at [facility], [address], [place], [place] [address]   Folate     Status: None   Collection Time: 06/25/22  2:59 AM  Result Value Ref Range   Folate 11.8 >5.9 ng/mL    Comment: Performed at [facility], [address] [place], [place] [address]  Iron and TIBC     Status: Abnormal   Collection Time: 06/25/22  2:59 AM  Result Value Ref Range   Iron 19 (L) 45 - 182 ug/dL   TIBC 130 (L) 865 - 784 ug/dL   Saturation Ratios 12 (L) 17.9 - 39.5 %   UIBC 145 ug/dL     Comment: Performed at [facility], [address], [place], [place] [address]  Ferritin     Status: Abnormal   Collection Time: 06/25/22  2:59 AM  Result Value Ref Range   Ferritin 793 (H) 24 - 336 ng/mL    Comment: Performed at [facility], [address] [place], [place] [address]  Reticulocytes     Status: Abnormal   Collection Time: 06/25/22  2:59 AM  Result Value Ref Range   Retic Ct Pct 3.2 (H) 0.4 - 3.1 %   RBC. 2.39 (L) 4.22 - 5.81 MIL/uL   Retic Count, Absolute 77.0 19.0 - 186.0 K/uL   Immature Retic Fract 20.8 (H) 2.3 - 15.9 %    Comment: Performed at [facility], [address], [place], [place] [address]  Magnesium     Status: Abnormal   Collection Time: 06/26/22  1:27 AM  Result Value Ref Range   Magnesium 1.4 (L) 1.7 - 2.4 mg/dL    Comment: Performed at [facility], [address], [place], [place] [address]  Renal function panel     Status: Abnormal   Collection Time: 06/26/22  1:27 AM  Result Value Ref Range   Sodium 137 135 - 145 mmol/L   Potassium 4.2 3.5 - 5.1 mmol/L   Chloride 97 (L) 98 - 111 mmol/L   CO2 27 22 - 32 mmol/L   Glucose, Bld 84 70 - 99 mg/dL    Comment: Glucose reference range applies only to samples taken after fasting for at least 8 hours.   BUN 32 (H) 8 - 23 mg/dL   Creatinine, Ser 5.36 (H) 0.61 - 1.24 mg/dL   Calcium 8.3 (L) 8.9 - 10.3 mg/dL   Phosphorus 4.4 2.5 - 4.6 mg/dL   Albumin 2.1 (L) 3.5 -  5.0 g/dL   GFR, Estimated 15 (L) >60 mL/min    Comment: (NOTE) Calculated using the CKD-EPI Creatinine Equation (2021)    Anion gap 13 5 - 15    Comment: Performed at [facility], [address] [place], [place] [address]  CBC with Differential/Platelet     Status: Abnormal   Collection Time: 06/26/22  1:27 AM  Result Value Ref Range   WBC 5.7 4.0 - 10.5 K/uL   RBC 2.31 (L) 4.22 - 5.81 MIL/uL   Hemoglobin 7.0 (L) 13.0 - 17.0 g/dL   HCT 60.4 (L) 54.0 - 98.1 %   MCV 100.0 80.0 - 100.0 fL   MCH 30.3 26.0 -  34.0 pg   MCHC 30.3 30.0 - 36.0 g/dL   RDW 19.1 (H) 47.8 - 29.5 %   Platelets 163 150 - 400 K/uL   nRBC 0.0 0.0 - 0.2 %   Neutrophils Relative % 67 %   Neutro Abs 3.8 1.7 - 7.7 K/uL   Lymphocytes Relative 17 %   Lymphs Abs 1.0 0.7 - 4.0 K/uL   Monocytes Relative 9 %   Monocytes Absolute 0.5 0.1 - 1.0 K/uL   Eosinophils Relative 6 %   Eosinophils Absolute 0.3 0.0 - 0.5 K/uL   Basophils Relative 1 %   Basophils Absolute 0.0 0.0 - 0.1 K/uL   Immature Granulocytes 0 %   Abs Immature Granulocytes 0.01 0.00 - 0.07 K/uL    Comment: Performed at [facility], [address], [place], [place] [address]  ABO/Rh     Status: None   Collection Time: 06/26/22  1:27 AM  Result Value Ref Range   ABO/RH(D)      O POS Performed at [facility], [address], [place], [place] [address]   Type and screen [facility]     Status: None   Collection Time: 06/26/22  8:34 AM  Result Value Ref Range   ABO/RH(D) O POS    Antibody Screen NEG    Sample Expiration 06/29/2022,2359    Unit Number [id number]    Blood Component Type RED CELLS,LR    Unit division 00    Status of Unit ISSUED,FINAL    Transfusion Status OK TO TRANSFUSE    Crossmatch Result Compatible    Unit Number [id number]    Blood Component Type RED CELLS,LR    Unit division 00    Status of Unit REL FROM [facility]    Transfusion Status OK TO TRANSFUSE    Crossmatch Result      Compatible Performed at [facility], [address], [place], [place] [address]   Prepare RBC (crossmatch)     Status: None   Collection Time: 06/26/22  8:34 AM  Result Value Ref Range   Order Confirmation      ORDER PROCESSED BY BLOOD BANK Performed at [facility], [address] [place], [place] [address]   BPAM RBC     Status: None   Collection Time: 06/26/22  8:34 AM  Result Value Ref Range   ISSUE DATE / TIME [id number]    Blood Product Unit Number [id number]    PRODUCT CODE [id number]    Unit  Type and Rh 5100    Blood Product Expiration Date 010932355732    ISSUE DATE / TIME 202542706237    Blood Product Unit Number [id number]    PRODUCT CODE [id number]    Unit Type and Rh 5100  Blood Product Expiration Date 540981191478   Lipid panel     Status: Abnormal   Collection Time: 06/26/22  8:38 AM  Result Value Ref Range   Cholesterol 85 0 - 200 mg/dL   Triglycerides 96 <295 mg/dL   HDL 27 (L) >62 mg/dL   Total CHOL/HDL Ratio 3.1 RATIO   VLDL 19 0 - 40 mg/dL   LDL Cholesterol 39 0 - 99 mg/dL    Comment:        Total Cholesterol/HDL:CHD Risk Coronary Heart Disease Risk Table                     Men   Women  1/2 Average Risk   3.4   3.3  Average Risk       5.0   4.4  2 X Average Risk   9.6   7.1  3 X Average Risk  23.4   11.0        Use the calculated Patient Ratio above and the CHD Risk Table to determine the patient's CHD Risk.        ATP III CLASSIFICATION (LDL):  <100     mg/dL   Optimal  130-865  mg/dL   Near or Above                    Optimal  130-159  mg/dL   Borderline  784-696  mg/dL   High  >295     mg/dL   Very High Performed at [facility], [address] [place], [place] [address]   Hemoglobin and hematocrit, blood     Status: Abnormal   Collection Time: 06/26/22  7:13 PM  Result Value Ref Range   Hemoglobin 8.8 (L) 13.0 - 17.0 g/dL    Comment: REPEATED TO VERIFY POST TRANSFUSION SPECIMEN    HCT 28.4 (L) 39.0 - 52.0 %    Comment: Performed at [facility], [address], [place], [place] [address]  CBC with Differential/Platelet     Status: Abnormal   Collection Time: 06/27/22  8:05 AM  Result Value Ref Range   WBC 7.1 4.0 - 10.5 K/uL   RBC 2.63 (L) 4.22 - 5.81 MIL/uL   Hemoglobin 7.8 (L) 13.0 - 17.0 g/dL   HCT 02.7 (L) 25.3 - 66.4 %   MCV 97.7 80.0 - 100.0 fL   MCH 29.7 26.0 - 34.0 pg   MCHC 30.4 30.0 - 36.0 g/dL   RDW 40.3 (H) 47.4 - 25.9 %   Platelets 180 150 - 400 K/uL   nRBC 0.0 0.0 - 0.2 %   Neutrophils Relative %  73 %   Neutro Abs 5.2 1.7 - 7.7 K/uL   Lymphocytes Relative 15 %   Lymphs Abs 1.0 0.7 - 4.0 K/uL   Monocytes Relative 7 %   Monocytes Absolute 0.5 0.1 - 1.0 K/uL   Eosinophils Relative 5 %   Eosinophils Absolute 0.3 0.0 - 0.5 K/uL   Basophils Relative 0 %   Basophils Absolute 0.0 0.0 - 0.1 K/uL   Immature Granulocytes 0 %   Abs Immature Granulocytes 0.01 0.00 - 0.07 K/uL    Comment: Performed at [facility], [address], [place], [place] [address]  Renal function panel     Status: Abnormal   Collection Time: 06/27/22  8:05 AM  Result Value Ref Range   Sodium 134 (L) 135 - 145 mmol/L   Potassium 4.3 3.5 - 5.1 mmol/L  Chloride 96 (L) 98 - 111 mmol/L   CO2 24 22 - 32 mmol/L   Glucose, Bld 106 (H) 70 - 99 mg/dL    Comment: Glucose reference range applies only to samples taken after fasting for at least 8 hours.   BUN 46 (H) 8 - 23 mg/dL   Creatinine, Ser 5.28 (H) 0.61 - 1.24 mg/dL   Calcium 8.5 (L) 8.9 - 10.3 mg/dL   Phosphorus 6.0 (H) 2.5 - 4.6 mg/dL   Albumin 2.3 (L) 3.5 - 5.0 g/dL   GFR, Estimated 10 (L) >60 mL/min    Comment: (NOTE) Calculated using the CKD-EPI Creatinine Equation (2021)    Anion gap 14 5 - 15    Comment: Performed at [facility], [address], [place], [place] [address]  Magnesium     Status: Abnormal   Collection Time: 06/27/22  8:05 AM  Result Value Ref Range   Magnesium 1.6 (L) 1.7 - 2.4 mg/dL    Comment: Performed at [facility], [address] [place], [place] [address]  Urine Culture (for pregnant, neutropenic or urologic patients or patients with an indwelling urinary catheter)     Status: None   Collection Time: 06/27/22  8:07 AM   Specimen: Urine, Clean Catch  Result Value Ref Range   Specimen Description URINE, CLEAN CATCH    Special Requests NONE    Culture      NO GROWTH Performed at [facility], [address], [place], [place] [address]    Report Status 06/28/2022 FINAL   Urinalysis, Routine w reflex  microscopic -Urine, Clean Catch     Status: Abnormal   Collection Time: 06/27/22  1:51 PM  Result Value Ref Range   Color, Urine STRAW (A) YELLOW   APPearance CLEAR CLEAR   Specific Gravity, Urine 1.006 1.005 - 1.030   pH 9.0 (H) 5.0 - 8.0   Glucose, UA 150 (A) NEGATIVE mg/dL   Hgb urine dipstick LARGE (A) NEGATIVE   Bilirubin Urine NEGATIVE NEGATIVE   Ketones, ur NEGATIVE NEGATIVE mg/dL   Protein, ur 30 (A) NEGATIVE mg/dL   Nitrite NEGATIVE NEGATIVE   Leukocytes,Ua MODERATE (A) NEGATIVE   RBC / HPF >50 0 - 5 RBC/hpf   WBC, UA 21-50 0 - 5 WBC/hpf   Bacteria, UA RARE (A) NONE SEEN   Squamous Epithelial / HPF 0-5 0 - 5 /HPF    Comment: Performed at [facility], [address] [place], [place] [address]  Magnesium     Status: Abnormal   Collection Time: 06/28/22  1:30 AM  Result Value Ref Range   Magnesium 1.4 (L) 1.7 - 2.4 mg/dL    Comment: Performed at [facility], [address] [place], [place] [address]  CBC     Status: Abnormal   Collection Time: 06/28/22  1:30 AM  Result Value Ref Range   WBC 6.5 4.0 - 10.5 K/uL   RBC 2.65 (L) 4.22 - 5.81 MIL/uL   Hemoglobin 8.0 (L) 13.0 - 17.0 g/dL   HCT 95.6 (L) 38.7 - 56.4 %   MCV 97.0 80.0 - 100.0 fL   MCH 30.2 26.0 - 34.0 pg   MCHC 31.1 30.0 - 36.0 g/dL   RDW 33.2 95.1 - 88.4 %   Platelets 162 150 - 400 K/uL   nRBC 0.0 0.0 - 0.2 %    Comment: Performed at [facility], [address] [place], [place] [address]  Renal  function panel     Status: Abnormal   Collection Time: 06/28/22  1:30 AM  Result Value Ref Range   Sodium 133 (L) 135 - 145 mmol/L   Potassium 3.4 (L) 3.5 - 5.1 mmol/L   Chloride 95 (L) 98 - 111 mmol/L   CO2 27 22 - 32 mmol/L   Glucose, Bld 91 70 - 99 mg/dL    Comment: Glucose reference range applies only to samples taken after fasting for at least 8 hours.   BUN 20 8 - 23 mg/dL   Creatinine, Ser 8.46 (H) 0.61 - 1.24 mg/dL   Calcium 8.7 (L) 8.9 - 10.3 mg/dL   Phosphorus 3.5 2.5 - 4.6 mg/dL    Albumin 2.3 (L) 3.5 - 5.0 g/dL   GFR, Estimated 19 (L) >60 mL/min    Comment: (NOTE) Calculated using the CKD-EPI Creatinine Equation (2021)    Anion gap 11 5 - 15    Comment: Performed at [facility], [address], [place], [place] [address]  Hemoglobin and hematocrit, blood     Status: Abnormal   Collection Time: 06/29/22  1:23 AM  Result Value Ref Range   Hemoglobin 8.5 (L) 13.0 - 17.0 g/dL   HCT 28.4 (L) 13.2 - 44.0 %    Comment: Performed at [facility], [address], [place], [place] [address]  Renal function panel     Status: Abnormal   Collection Time: 06/29/22  1:23 AM  Result Value Ref Range   Sodium 133 (L) 135 - 145 mmol/L   Potassium 3.4 (L) 3.5 - 5.1 mmol/L   Chloride 96 (L) 98 - 111 mmol/L   CO2 29 22 - 32 mmol/L   Glucose, Bld 110 (H) 70 - 99 mg/dL    Comment: Glucose reference range applies only to samples taken after fasting for at least 8 hours.   BUN 11 8 - 23 mg/dL   Creatinine, Ser 5.36 (H) 0.61 - 1.24 mg/dL   Calcium 8.7 (L) 8.9 - 10.3 mg/dL   Phosphorus 2.5 2.5 - 4.6 mg/dL   Albumin 2.4 (L) 3.5 - 5.0 g/dL   GFR, Estimated 28 (L) >60 mL/min    Comment: (NOTE) Calculated using the CKD-EPI Creatinine Equation (2021)    Anion gap 8 5 - 15    Comment: Performed at [facility], [address] [place], [place] [address]  Magnesium     Status: Abnormal   Collection Time: 06/29/22  1:23 AM  Result Value Ref Range   Magnesium 1.4 (L) 1.7 - 2.4 mg/dL    Comment: Performed at [facility], [address] [place], [place] [address]  CBC     Status: Abnormal   Collection Time: 06/30/22  1:15 AM  Result Value Ref Range   WBC 7.6 4.0 - 10.5 K/uL   RBC 2.91 (L) 4.22 - 5.81 MIL/uL   Hemoglobin 8.8 (L) 13.0 - 17.0 g/dL   HCT 95.6 (L) 38.7 - 56.4 %   MCV 98.6 80.0 - 100.0 fL   MCH 30.2 26.0 - 34.0 pg   MCHC 30.7 30.0 - 36.0 g/dL   RDW 33.2 95.1 - 88.4 %   Platelets 183 150 - 400 K/uL   nRBC 0.0 0.0 - 0.2 %    Comment:  Performed at [facility], [address], [place], [place] [address]  Basic metabolic panel     Status: Abnormal   Collection Time: 06/30/22  1:15 AM  Result  Value Ref Range   Sodium 132 (L) 135 - 145 mmol/L   Potassium 4.1 3.5 - 5.1 mmol/L   Chloride 95 (L) 98 - 111 mmol/L   CO2 27 22 - 32 mmol/L   Glucose, Bld 97 70 - 99 mg/dL    Comment: Glucose reference range applies only to samples taken after fasting for at least 8 hours.   BUN 20 8 - 23 mg/dL   Creatinine, Ser 4.13 (H) 0.61 - 1.24 mg/dL    Comment: DELTA CHECK NOTED   Calcium 9.1 8.9 - 10.3 mg/dL   GFR, Estimated 16 (L) >60 mL/min    Comment: (NOTE) Calculated using the CKD-EPI Creatinine Equation (2021)    Anion gap 10 5 - 15    Comment: Performed at [facility], [address], [place], [place] [address]  Magnesium     Status: Abnormal   Collection Time: 06/30/22  1:15 AM  Result Value Ref Range   Magnesium 1.4 (L) 1.7 - 2.4 mg/dL    Comment: Performed at [facility], [address] [place], [place] [address]  Glucose, capillary     Status: Abnormal   Collection Time: 06/30/22  4:30 PM  Result Value Ref Range   Glucose-Capillary 67 (L) 70 - 99 mg/dL    Comment: Glucose reference range applies only to samples taken after fasting for at least 8 hours.  Glucose, capillary     Status: Abnormal   Collection Time: 06/30/22  4:49 PM  Result Value Ref Range   Glucose-Capillary 129 (H) 70 - 99 mg/dL    Comment: Glucose reference range applies only to samples taken after fasting for at least 8 hours.  Glucose, capillary     Status: None   Collection Time: 06/30/22  7:14 PM  Result Value Ref Range   Glucose-Capillary 89 70 - 99 mg/dL    Comment: Glucose reference range applies only to samples taken after fasting for at least 8 hours.  Glucose, capillary     Status: None   Collection Time: 06/30/22  8:19 PM  Result Value Ref Range   Glucose-Capillary 90 70 - 99 mg/dL    Comment: Glucose reference range  applies only to samples taken after fasting for at least 8 hours.  CBC     Status: Abnormal   Collection Time: 07/01/22  9:01 AM  Result Value Ref Range   WBC 10.5 4.0 - 10.5 K/uL   RBC 2.38 (L) 4.22 - 5.81 MIL/uL   Hemoglobin 7.4 (L) 13.0 - 17.0 g/dL   HCT 36.6 (L) 44.0 - 34.7 %   MCV 101.7 (H) 80.0 - 100.0 fL   MCH 31.1 26.0 - 34.0 pg   MCHC 30.6 30.0 - 36.0 g/dL   RDW 42.5 95.6 - 38.7 %   Platelets 209 150 - 400 K/uL   nRBC 0.0 0.0 - 0.2 %    Comment: Performed at [facility], [address] [place], [place] [address]  Magnesium     Status: Abnormal   Collection Time: 07/01/22  9:01 AM  Result Value Ref Range   Magnesium 1.3 (L) 1.7 - 2.4 mg/dL    Comment: Performed at [facility], [address] [place], [place] [address]  Renal function panel     Status: Abnormal   Collection Time: 07/01/22  9:01 AM  Result Value Ref Range   Sodium 131 (L) 135 - 145 mmol/L   Potassium 5.0 3.5 - 5.1 mmol/L  Chloride 96 (L) 98 - 111 mmol/L   CO2 24 22 - 32 mmol/L   Glucose, Bld 147 (H) 70 - 99 mg/dL    Comment: Glucose reference range applies only to samples taken after fasting for at least 8 hours.   BUN 31 (H) 8 - 23 mg/dL   Creatinine, Ser 1.61 (H) 0.61 - 1.24 mg/dL   Calcium 8.2 (L) 8.9 - 10.3 mg/dL   Phosphorus 6.1 (H) 2.5 - 4.6 mg/dL   Albumin 2.2 (L) 3.5 - 5.0 g/dL   GFR, Estimated 12 (L) >60 mL/min    Comment: (NOTE) Calculated using the CKD-EPI Creatinine Equation (2021)    Anion gap 11 5 - 15    Comment: Performed at [facility], [address] [place], [place] [address]  Prepare RBC (crossmatch)     Status: None   Collection Time: 07/01/22  9:53 AM  Result Value Ref Range   Order Confirmation      ORDER PROCESSED BY BLOOD BANK Performed at [facility], [address] [place], [place] [address]   Type and screen [facility]     Status: None   Collection Time: 07/01/22  9:59 AM  Result Value Ref Range   ABO/RH(D) O POS     Antibody Screen NEG    Sample Expiration 07/04/2022,2359    Unit Number [id number]    Blood Component Type RED CELLS,LR    Unit division 00    Status of Unit ISSUED,FINAL    Transfusion Status OK TO TRANSFUSE    Crossmatch Result      Compatible Performed at [facility], [address], [place], [place] [address]   BPAM RBC     Status: None   Collection Time: 07/01/22  9:59 AM  Result Value Ref Range   ISSUE DATE / TIME [id number]    Blood Product Unit Number [id number]    PRODUCT CODE [id number]    Unit Type and Rh 5100    Blood Product Expiration Date [id number]   Type and screen [facility]     Status: None   Collection Time: 07/15/22  6:47 PM  Result Value Ref Range   ABO/RH(D) O POS    Antibody Screen NEG    Sample Expiration      07/18/2022,2359 Performed at [facility], [address] [place], [place] [address]   Culture, blood (routine x 2)     Status: None (Preliminary result)   Collection Time: 07/15/22  6:47 PM   Specimen: BLOOD RIGHT HAND  Result Value Ref Range   Specimen Description BLOOD RIGHT HAND    Special Requests      BOTTLES DRAWN AEROBIC ONLY Blood Culture results may not be optimal due to an inadequate volume of blood received in culture bottles   Culture      NO GROWTH < 12 HOURS Performed at [facility], [address], [place], [place] [address]    Report Status PENDING   Culture, blood (routine x 2)     Status: None (Preliminary result)   Collection Time: 07/15/22  6:47 PM   Specimen: BLOOD RIGHT HAND  Result Value Ref Range   Specimen Description BLOOD RIGHT HAND    Special Requests      BOTTLES DRAWN AEROBIC ONLY Blood Culture adequate volume   Culture      NO GROWTH < 12 HOURS Performed at [facility], [address] [place], [place] [address]    Report Status PENDING   CBC with Differential     Status: Abnormal   Collection Time: 07/15/22  6:51 PM  Result Value Ref Range   WBC  7.9 4.0 - 10.5 K/uL   RBC 3.08 (L) 4.22 - 5.81 MIL/uL   Hemoglobin 9.5 (L) 13.0 - 17.0 g/dL   HCT 19.1 (L) 47.8 - 29.5 %   MCV 99.4 80.0 - 100.0 fL   MCH 30.8 26.0 - 34.0 pg   MCHC 31.0 30.0 - 36.0 g/dL   RDW 62.1 30.8 - 65.7 %   Platelets 185 150 - 400 K/uL   nRBC 0.0 0.0 - 0.2 %   Neutrophils Relative % 72 %   Neutro Abs 5.7 1.7 - 7.7 K/uL   Lymphocytes Relative 14 %   Lymphs Abs 1.1 0.7 - 4.0 K/uL   Monocytes Relative 9 %   Monocytes Absolute 0.7 0.1 - 1.0 K/uL   Eosinophils Relative 3 %   Eosinophils Absolute 0.3 0.0 - 0.5 K/uL   Basophils Relative 1 %   Basophils Absolute 0.1 0.0 - 0.1 K/uL   Immature Granulocytes 1 %   Abs Immature Granulocytes 0.04 0.00 - 0.07 K/uL    Comment: Performed at [facility], [address] [place], [place] [address]  Troponin I (High Sensitivity)     Status: Abnormal   Collection Time: 07/15/22  6:51 PM  Result Value Ref Range   Troponin I (High Sensitivity) 34 (H) <18 ng/L    Comment: (NOTE) Elevated high sensitivity troponin I (hsTnI) values and significant  changes across serial measurements may suggest ACS but many other  chronic and acute conditions are known to elevate hsTnI results.  Refer to the "Links" section for chest pain algorithms and additional  guidance. Performed at [facility], [address], [place], [place] [address]   Comprehensive metabolic panel     Status: Abnormal   Collection Time: 07/15/22  6:51 PM  Result Value Ref Range   Sodium 139 135 - 145 mmol/L   Potassium 4.8 3.5 - 5.1 mmol/L    Comment: HEMOLYSIS AT THIS LEVEL MAY AFFECT RESULT   Chloride 100 98 - 111 mmol/L   CO2 25 22 - 32 mmol/L   Glucose, Bld 100 (H) 70 - 99 mg/dL    Comment: Glucose reference range applies only to samples taken after fasting for at least 8 hours.   BUN 16 8 - 23 mg/dL   Creatinine, Ser 4.13 (H) 0.61 - 1.24 mg/dL   Calcium 8.7 (L) 8.9 - 10.3 mg/dL   Total Protein 6.3 (L) 6.5 - 8.1 g/dL   Albumin 2.5 (L) 3.5 -  5.0 g/dL   AST 23 15 - 41 U/L   ALT 6 0 - 44 U/L   Alkaline Phosphatase 112 38 - 126 U/L   Total Bilirubin 1.1 0.3 - 1.2 mg/dL   GFR, Estimated 22 (L) >60 mL/min    Comment: (NOTE) Calculated using the CKD-EPI Creatinine Equation (2021)    Anion gap 14 5 - 15    Comment: Performed at [facility], [address], [place], [place] [address]  CK     Status: None   Collection Time: 07/15/22  6:51 PM  Result Value Ref Range   Total CK 151 49 - 397 U/L    Comment: Performed at [facility], [address] [place], [place] [address]  Protime-INR  Status: Abnormal   Collection Time: 07/15/22  6:51 PM  Result Value Ref Range   Prothrombin Time 15.6 (H) 11.4 - 15.2 seconds   INR 1.2 0.8 - 1.2    Comment: (NOTE) INR goal varies based on device and disease states. Performed at [facility], [address], [place], [place] [address]   Lactic acid, plasma     Status: None   Collection Time: 07/15/22  6:51 PM  Result Value Ref Range   Lactic Acid, Venous 1.2 0.5 - 1.9 mmol/L    Comment: Performed at [facility], [address], [place], [place] [address]  I-stat chem 8, ED     Status: Abnormal   Collection Time: 07/15/22  7:07 PM  Result Value Ref Range   Sodium 137 135 - 145 mmol/L   Potassium 4.7 3.5 - 5.1 mmol/L   Chloride 100 98 - 111 mmol/L   BUN 20 8 - 23 mg/dL   Creatinine, Ser 2.84 (H) 0.61 - 1.24 mg/dL   Glucose, Bld 97 70 - 99 mg/dL    Comment: Glucose reference range applies only to samples taken after fasting for at least 8 hours.   Calcium, Ion 1.07 (L) 1.15 - 1.40 mmol/L   TCO2 29 22 - 32 mmol/L   Hemoglobin 10.2 (L) 13.0 - 17.0 g/dL   HCT 13.2 (L) 44.0 - 10.2 %  I-Stat venous blood gas, ED     Status: Abnormal   Collection Time: 07/15/22  7:08 PM  Result Value Ref Range   pH, Ven 7.463 (H) 7.25 - 7.43   pCO2, Ven 43.0 (L) 44 - 60 mmHg   pO2, Ven 48 (H) 32 - 45 mmHg   Bicarbonate 30.8 (H) 20.0 - 28.0 mmol/L   TCO2 32 22 - 32 mmol/L    O2 Saturation 85 %   Acid-Base Excess 6.0 (H) 0.0 - 2.0 mmol/L   Sodium 137 135 - 145 mmol/L   Potassium 4.8 3.5 - 5.1 mmol/L   Calcium, Ion 1.09 (L) 1.15 - 1.40 mmol/L   HCT 31.0 (L) 39.0 - 52.0 %   Hemoglobin 10.5 (L) 13.0 - 17.0 g/dL   Sample type VENOUS   Troponin I (High Sensitivity)     Status: Abnormal   Collection Time: 07/15/22 10:10 PM  Result Value Ref Range   Troponin I (High Sensitivity) 40 (H) <18 ng/L    Comment: (NOTE) Elevated high sensitivity troponin I (hsTnI) values and significant  changes across serial measurements may suggest ACS but many other  chronic and acute conditions are known to elevate hsTnI results.  Refer to the "Links" section for chest pain algorithms and additional  guidance. Performed at [facility], [address], [place], [place] [address]   Ammonia     Status: None   Collection Time: 07/16/22  1:00 AM  Result Value Ref Range   Ammonia 19 9 - 35 umol/L    Comment: Performed at [facility], [address], [place], [place] [address]  CBC     Status: Abnormal   Collection Time: 07/16/22  2:55 AM  Result Value Ref Range   WBC 7.6 4.0 - 10.5 K/uL   RBC 3.08 (L) 4.22 - 5.81 MIL/uL   Hemoglobin 9.1 (L) 13.0 - 17.0 g/dL   HCT 47.4 (L) 25.9 - 56.3 %   MCV 98.7 80.0 - 100.0 fL   MCH 29.5 26.0 - 34.0 pg   MCHC 29.9 (L) 30.0 -  36.0 g/dL   RDW 16.1 09.6 - 04.5 %   Platelets 177 150 - 400 K/uL   nRBC 0.0 0.0 - 0.2 %    Comment: Performed at [facility], [address] [place], [place] [address]  Basic metabolic panel     Status: Abnormal   Collection Time: 07/16/22  2:55 AM  Result Value Ref Range   Sodium 139 135 - 145 mmol/L   Potassium 3.8 3.5 - 5.1 mmol/L   Chloride 99 98 - 111 mmol/L   CO2 25 22 - 32 mmol/L   Glucose, Bld 80 70 - 99 mg/dL    Comment: Glucose reference range applies only to samples taken after fasting for at least 8 hours.   BUN 18 8 - 23 mg/dL   Creatinine, Ser 1.91 (H) 0.61 - 1.24 mg/dL    Calcium 8.9 8.9 - 47.8 mg/dL   GFR, Estimated 19 (L) >60 mL/min    Comment: (NOTE) Calculated using the CKD-EPI Creatinine Equation (2021)    Anion gap 15 5 - 15    Comment: Performed at [facility], [address], [place], [place] [address]  Troponin I (High Sensitivity)     Status: Abnormal   Collection Time: 07/16/22  2:55 AM  Result Value Ref Range   Troponin I (High Sensitivity) 40 (H) <18 ng/L    Comment: (NOTE) Elevated high sensitivity troponin I (hsTnI) values and significant  changes across serial measurements may suggest ACS but many other  chronic and acute conditions are known to elevate hsTnI results.  Refer to the "Links" section for chest pain algorithms and additional  guidance. Performed at [facility], [address] [place], [place] [address]   Hemoglobin A1c     Status: Abnormal   Collection Time: 07/16/22  2:55 AM  Result Value Ref Range   Hgb A1c MFr Bld 4.4 (L) 4.8 - 5.6 %    Comment: (NOTE) Pre diabetes:          5.7%-6.4%  Diabetes:              >6.4%  Glycemic control for   <7.0% adults with diabetes    Mean Plasma Glucose 79.58 mg/dL    Comment: Performed at [facility], [address], [place], [place] [address]  Hepatitis B surface antigen     Status: None   Collection Time: 07/16/22  3:26 PM  Result Value Ref Range   Hepatitis B Surface Ag NON REACTIVE NON REACTIVE    Comment: Performed at [facility], [address] [place], [place] [address]     Complete History and Physical exam available in the office notes  Nolberto Hanlon

## 2022-07-16 NOTE — Progress Notes (Signed)
    I reviewed the CT scan which demonstrates hematoma at the site of PD catheter removal.  Abdomen is soft and nontender does not appear to have any acute issues.  He has been revascularized in the right upper extremity and should be okay for digital amputation is needed.  Chalmers Iddings C. Randie Heinz, MD

## 2022-07-17 ENCOUNTER — Other Ambulatory Visit: Payer: Self-pay

## 2022-07-17 ENCOUNTER — Encounter (HOSPITAL_COMMUNITY)
Admission: EM | Disposition: A | Discharge status: Skilled Nursing Facility | Payer: Self-pay | Source: Home / Self Care | Attending: Family Medicine

## 2022-07-17 ENCOUNTER — Inpatient Hospital Stay (HOSPITAL_COMMUNITY): Payer: Medicare Other | Admitting: Anesthesiology

## 2022-07-17 ENCOUNTER — Encounter (HOSPITAL_COMMUNITY): Payer: Self-pay | Admitting: Internal Medicine

## 2022-07-17 DIAGNOSIS — E1152 Type 2 diabetes mellitus with diabetic peripheral angiopathy with gangrene: Secondary | ICD-10-CM

## 2022-07-17 DIAGNOSIS — I96 Gangrene, not elsewhere classified: Secondary | ICD-10-CM | POA: Diagnosis not present

## 2022-07-17 DIAGNOSIS — E1122 Type 2 diabetes mellitus with diabetic chronic kidney disease: Secondary | ICD-10-CM

## 2022-07-17 DIAGNOSIS — I120 Hypertensive chronic kidney disease with stage 5 chronic kidney disease or end stage renal disease: Secondary | ICD-10-CM

## 2022-07-17 DIAGNOSIS — N186 End stage renal disease: Secondary | ICD-10-CM

## 2022-07-17 DIAGNOSIS — Z992 Dependence on renal dialysis: Secondary | ICD-10-CM

## 2022-07-17 HISTORY — PX: AMPUTATION: SHX166

## 2022-07-17 LAB — GLUCOSE, CAPILLARY
Glucose-Capillary: 78 mg/dL (ref 70–99)
Glucose-Capillary: 79 mg/dL (ref 70–99)

## 2022-07-17 LAB — CBC WITH DIFFERENTIAL/PLATELET
Abs Immature Granulocytes: 0.06 10*3/uL (ref 0.00–0.07)
Basophils Absolute: 0.1 10*3/uL (ref 0.0–0.1)
Basophils Relative: 0 %
Eosinophils Absolute: 0.2 10*3/uL (ref 0.0–0.5)
Eosinophils Relative: 1 %
HCT: 34.7 % — ABNORMAL LOW (ref 39.0–52.0)
Hemoglobin: 10.6 g/dL — ABNORMAL LOW (ref 13.0–17.0)
Immature Granulocytes: 0 %
Lymphocytes Relative: 9 %
Lymphs Abs: 1.2 10*3/uL (ref 0.7–4.0)
MCH: 30.1 pg (ref 26.0–34.0)
MCHC: 30.5 g/dL (ref 30.0–36.0)
MCV: 98.6 fL (ref 80.0–100.0)
Monocytes Absolute: 0.7 10*3/uL (ref 0.1–1.0)
Monocytes Relative: 5 %
Neutro Abs: 11.3 10*3/uL — ABNORMAL HIGH (ref 1.7–7.7)
Neutrophils Relative %: 85 %
Platelets: 189 10*3/uL (ref 150–400)
RBC: 3.52 MIL/uL — ABNORMAL LOW (ref 4.22–5.81)
RDW: 15 % (ref 11.5–15.5)
WBC: 13.5 10*3/uL — ABNORMAL HIGH (ref 4.0–10.5)
nRBC: 0 % (ref 0.0–0.2)

## 2022-07-17 LAB — POCT I-STAT, CHEM 8
BUN: 14 mg/dL (ref 8–23)
Calcium, Ion: 1.01 mmol/L — ABNORMAL LOW (ref 1.15–1.40)
Chloride: 99 mmol/L (ref 98–111)
Creatinine, Ser: 2.7 mg/dL — ABNORMAL HIGH (ref 0.61–1.24)
Glucose, Bld: 78 mg/dL (ref 70–99)
HCT: 35 % — ABNORMAL LOW (ref 39.0–52.0)
Hemoglobin: 11.9 g/dL — ABNORMAL LOW (ref 13.0–17.0)
Potassium: 4.3 mmol/L (ref 3.5–5.1)
Sodium: 135 mmol/L (ref 135–145)
TCO2: 29 mmol/L (ref 22–32)

## 2022-07-17 LAB — HEPATITIS B SURFACE ANTIBODY, QUANTITATIVE: Hep B S AB Quant (Post): 8.9 m[IU]/mL — ABNORMAL LOW

## 2022-07-17 SURGERY — AMPUTATION DIGIT
Anesthesia: Monitor Anesthesia Care | Site: Hand | Laterality: Right

## 2022-07-17 MED ORDER — HEPARIN SODIUM (PORCINE) 1000 UNIT/ML IJ SOLN
2000.0000 [IU] | Freq: Once | INTRAMUSCULAR | Status: AC
  Administered 2022-07-17: 2000 [IU]
  Filled 2022-07-17: qty 2

## 2022-07-17 MED ORDER — CHLORHEXIDINE GLUCONATE 0.12 % MT SOLN
OROMUCOSAL | Status: AC
  Administered 2022-07-17: 15 mL via OROMUCOSAL
  Filled 2022-07-17: qty 15

## 2022-07-17 MED ORDER — FENTANYL CITRATE (PF) 250 MCG/5ML IJ SOLN
INTRAMUSCULAR | Status: DC | PRN
  Administered 2022-07-17: 25 ug via INTRAVENOUS

## 2022-07-17 MED ORDER — ACETAMINOPHEN 500 MG PO TABS
1000.0000 mg | ORAL_TABLET | Freq: Once | ORAL | Status: AC
  Administered 2022-07-17: 1000 mg via ORAL
  Filled 2022-07-17: qty 2

## 2022-07-17 MED ORDER — SODIUM CHLORIDE 0.9 % IV SOLN: INTRAVENOUS | Status: DC

## 2022-07-17 MED ORDER — PROPOFOL 500 MG/50ML IV EMUL
INTRAVENOUS | Status: DC | PRN
  Administered 2022-07-17: 70 ug/kg/min via INTRAVENOUS

## 2022-07-17 MED ORDER — 0.9 % SODIUM CHLORIDE (POUR BTL) OPTIME
TOPICAL | Status: DC | PRN
  Administered 2022-07-17: 1000 mL

## 2022-07-17 MED ORDER — BACITRACIN ZINC 500 UNIT/GM EX OINT
TOPICAL_OINTMENT | CUTANEOUS | Status: AC
  Filled 2022-07-17: qty 28.35

## 2022-07-17 MED ORDER — FENTANYL CITRATE (PF) 250 MCG/5ML IJ SOLN
INTRAMUSCULAR | Status: AC
  Filled 2022-07-17: qty 5

## 2022-07-17 MED ORDER — CHLORHEXIDINE GLUCONATE 0.12 % MT SOLN
15.0000 mL | OROMUCOSAL | Status: AC
  Filled 2022-07-17: qty 15

## 2022-07-17 MED ORDER — LIDOCAINE HCL (PF) 1 % IJ SOLN
INTRAMUSCULAR | Status: AC
  Filled 2022-07-17: qty 30

## 2022-07-17 MED ORDER — PROPOFOL 10 MG/ML IV BOLUS
INTRAVENOUS | Status: DC | PRN
Start: 2022-07-17 — End: 2022-07-17
  Administered 2022-07-17: 20 mg via INTRAVENOUS

## 2022-07-17 MED ORDER — BUPIVACAINE HCL (PF) 0.25 % IJ SOLN
INTRAMUSCULAR | Status: AC
  Filled 2022-07-17: qty 30

## 2022-07-17 MED ORDER — OXYCODONE HCL 5 MG/5ML PO SOLN: 5.0000 mg | Freq: Once | ORAL | Status: DC | PRN

## 2022-07-17 MED ORDER — OXYCODONE HCL 5 MG PO TABS: 5.0000 mg | ORAL_TABLET | Freq: Once | ORAL | Status: DC | PRN

## 2022-07-17 MED ORDER — ONDANSETRON HCL 4 MG/2ML IJ SOLN
4.0000 mg | Freq: Four times a day (QID) | INTRAMUSCULAR | Status: DC | PRN
  Administered 2022-07-18: 4 mg via INTRAVENOUS
  Filled 2022-07-17 (×2): qty 2

## 2022-07-17 MED ORDER — FENTANYL CITRATE (PF) 100 MCG/2ML IJ SOLN: 25.0000 ug | INTRAMUSCULAR | Status: DC | PRN

## 2022-07-17 MED ORDER — PHENYLEPHRINE 80 MCG/ML (10ML) SYRINGE FOR IV PUSH (FOR BLOOD PRESSURE SUPPORT)
PREFILLED_SYRINGE | INTRAVENOUS | Status: DC | PRN
  Administered 2022-07-17 (×2): 160 ug via INTRAVENOUS

## 2022-07-17 MED ORDER — LIDOCAINE HCL 1 % IJ SOLN
INTRAMUSCULAR | Status: DC | PRN
  Administered 2022-07-17: 26 mL

## 2022-07-17 MED ORDER — BACITRACIN ZINC 500 UNIT/GM EX OINT
TOPICAL_OINTMENT | CUTANEOUS | Status: DC | PRN
  Administered 2022-07-17: 1 via TOPICAL

## 2022-07-17 SURGICAL SUPPLY — 49 items
BAG COUNTER SPONGE SURGICOUNT (BAG) ×1 IMPLANT
BAG SPNG CNTER NS LX DISP (BAG) ×1
BLADE SURG 15 STRL LF DISP TIS (BLADE) IMPLANT
BLADE SURG 15 STRL SS (BLADE) ×1
BNDG CMPR 5X3 KNIT ELC UNQ LF (GAUZE/BANDAGES/DRESSINGS) ×1
BNDG CMPR 75X21 PLY HI ABS (MISCELLANEOUS)
BNDG COHESIVE 1X5 TAN STRL LF (GAUZE/BANDAGES/DRESSINGS) IMPLANT
BNDG ELASTIC 3INX 5YD STR LF (GAUZE/BANDAGES/DRESSINGS) IMPLANT
CORD BIPOLAR FORCEPS 12FT (ELECTRODE) ×1 IMPLANT
COVER SURGICAL LIGHT HANDLE (MISCELLANEOUS) ×1 IMPLANT
CUFF TOURN SGL QUICK 18X4 (TOURNIQUET CUFF) ×1 IMPLANT
CUFF TOURN SGL QUICK 24 (TOURNIQUET CUFF)
CUFF TRNQT CYL 24X4X16.5-23 (TOURNIQUET CUFF) IMPLANT
DRAPE SURG 17X23 STRL (DRAPES) ×1 IMPLANT
DRSG EMULSION OIL 3X3 NADH (GAUZE/BANDAGES/DRESSINGS) IMPLANT
DRSG MEPITEL 4X7.2 (GAUZE/BANDAGES/DRESSINGS) ×1 IMPLANT
GAUZE SPONGE 2X2 8PLY STRL LF (GAUZE/BANDAGES/DRESSINGS) IMPLANT
GAUZE SPONGE 4X4 12PLY STRL (GAUZE/BANDAGES/DRESSINGS) IMPLANT
GAUZE STRETCH 2X75IN STRL (MISCELLANEOUS) IMPLANT
GAUZE XEROFORM 1X8 LF (GAUZE/BANDAGES/DRESSINGS) IMPLANT
GLOVE BIOGEL M 8.0 STRL (GLOVE) ×1 IMPLANT
GLOVE BIOGEL PI IND STRL 7.5 (GLOVE) IMPLANT
GLOVE SS BIOGEL STRL SZ 7.5 (GLOVE) IMPLANT
GLOVE SS BIOGEL STRL SZ 8 (GLOVE) ×1 IMPLANT
GOWN STRL REUS W/ TWL LRG LVL3 (GOWN DISPOSABLE) ×1 IMPLANT
GOWN STRL REUS W/ TWL XL LVL3 (GOWN DISPOSABLE) ×2 IMPLANT
GOWN STRL REUS W/TWL LRG LVL3 (GOWN DISPOSABLE) ×1
GOWN STRL REUS W/TWL XL LVL3 (GOWN DISPOSABLE) ×2
KIT BASIN OR (CUSTOM PROCEDURE TRAY) ×1 IMPLANT
KIT TURNOVER KIT B (KITS) ×1 IMPLANT
MANIFOLD NEPTUNE II (INSTRUMENTS) ×1 IMPLANT
NDL HYPO 25GX1X1/2 BEV (NEEDLE) IMPLANT
NEEDLE HYPO 25GX1X1/2 BEV (NEEDLE) ×1 IMPLANT
NS IRRIG 1000ML POUR BTL (IV SOLUTION) ×1 IMPLANT
PACK ORTHO EXTREMITY (CUSTOM PROCEDURE TRAY) ×1 IMPLANT
PAD ARMBOARD 7.5X6 YLW CONV (MISCELLANEOUS) ×2 IMPLANT
SOL PREP POV-IOD 4OZ 10% (MISCELLANEOUS) ×3 IMPLANT
SPECIMEN JAR SMALL (MISCELLANEOUS) ×1 IMPLANT
SUT CHROMIC 4 0 PS 2 18 (SUTURE) IMPLANT
SUT MERSILENE 4 0 P 3 (SUTURE) IMPLANT
SUT PROLENE 4 0 PS 2 18 (SUTURE) IMPLANT
SYR CONTROL 10ML LL (SYRINGE) IMPLANT
TOURNI-COT LRG GREEN (MISCELLANEOUS) ×2 IMPLANT
TOURNIQUET ADLT LRG GRN (MISCELLANEOUS) IMPLANT
TOWEL GREEN STERILE (TOWEL DISPOSABLE) ×1 IMPLANT
TOWEL GREEN STERILE FF (TOWEL DISPOSABLE) ×1 IMPLANT
TUBE CONNECTING 12X1/4 (SUCTIONS) IMPLANT
UNDERPAD 30X36 HEAVY ABSORB (UNDERPADS AND DIAPERS) ×1 IMPLANT
WATER STERILE IRR 1000ML POUR (IV SOLUTION) ×1 IMPLANT

## 2022-07-17 NOTE — Progress Notes (Signed)
Pt receives out-pt HD at [facility] on TTS 11:30 chair time. Pt is from snf. Will assist as needed.   Olivia Canter Renal Navigator [phone]

## 2022-07-17 NOTE — Progress Notes (Addendum)
[facility] Progress Note   Subjective: Seen on HD. Awake, says he doesn't know where he gets dialysis. Nauseated earlier resolved without intervention. Has clotted system twice.   Objective Vitals:   07/17/22 0740 07/17/22 0830 07/17/22 0921 07/17/22 0933  BP: 118/84 125/66 (!) 130/56 135/63  Pulse: 62 63 64 66  Resp: (!) 21 19 19  (!) 26  Temp: (!) 97 F (36.1 C)     TempSrc:      SpO2: 97% 92% 93% 96%  Weight: 85.9 kg     Height:       Physical Exam General: Chronically ill appearing Neuro: Oriented to self only Heart: S1,S2 No M/R/G Lungs:CTAB Abdomen: NABS, NT Extremities:No LE edema Dialysis Access: L AVF + T/B    Additional Objective Labs: Basic Metabolic Panel: Recent Labs  Lab 07/15/22 1851 07/15/22 1907 07/15/22 1908 07/16/22 0255  NA 139 137 137 139  K 4.8 4.7 4.8 3.8  CL 100 100  --  99  CO2 25  --   --  25  GLUCOSE 100* 97  --  80  BUN 16 20  --  18  CREATININE 2.91* 3.20*  --  3.31*  CALCIUM 8.7*  --   --  8.9   Liver Function Tests: Recent Labs  Lab 07/15/22 1851  AST 23  ALT 6  ALKPHOS 112  BILITOT 1.1  PROT 6.3*  ALBUMIN 2.5*   No results for input(s): "LIPASE", "AMYLASE" in the last 168 hours. CBC: Recent Labs  Lab 07/15/22 1851 07/15/22 1907 07/15/22 1908 07/16/22 0255  WBC 7.9  --   --  7.6  NEUTROABS 5.7  --   --   --   HGB 9.5* 10.2* 10.5* 9.1*  HCT 30.6* 30.0* 31.0* 30.4*  MCV 99.4  --   --  98.7  PLT 185  --   --  177   Blood Culture    Component Value Date/Time   SDES BLOOD RIGHT HAND 07/15/2022 1847   SDES BLOOD RIGHT HAND 07/15/2022 1847   SPECREQUEST  07/15/2022 1847    BOTTLES DRAWN AEROBIC ONLY Blood Culture results may not be optimal due to an inadequate volume of blood received in culture bottles   SPECREQUEST  07/15/2022 1847    BOTTLES DRAWN AEROBIC ONLY Blood Culture adequate volume   CULT  07/15/2022 1847    NO GROWTH 2 DAYS Performed at [facility], [address]  [place], [place] [address]    CULT  07/15/2022 1847    NO GROWTH 2 DAYS Performed at [facility], [address], [place], [place] [address]    REPTSTATUS PENDING 07/15/2022 1847   REPTSTATUS PENDING 07/15/2022 1847    Cardiac Enzymes: Recent Labs  Lab 07/15/22 1851  CKTOTAL 151   CBG: No results for input(s): "GLUCAP" in the last 168 hours. Iron Studies: No results for input(s): "IRON", "TIBC", "TRANSFERRIN", "FERRITIN" in the last 72 hours. @lablastinr3 @ Studies/Results: CT CHEST ABDOMEN PELVIS WO CONTRAST  Result Date: 07/15/2022 CLINICAL DATA:  Nonlocalized abdominal pain. History of GIST of the stomach, left nephrectomy, cholecystectomy. Recent removal of peritoneal dialysis catheter 06/30/2022. EXAM: CT CHEST, ABDOMEN AND PELVIS WITHOUT CONTRAST TECHNIQUE: Multidetector CT imaging of the chest, abdomen and pelvis was performed following the standard protocol without IV contrast. RADIATION DOSE REDUCTION: This exam was performed according to the departmental dose-optimization program which includes automated exposure control, adjustment of the mA and/or kV according to patient size and/or use of iterative reconstruction  technique. COMPARISON:  Portable chest today, portable chest 05/07/2022, chest, abdomen and pelvis CTs with contrast 04/11/2022 and 10/21/2021. FINDINGS: CT CHEST FINDINGS Cardiovascular: Stable cardiomegaly with patchy three-vessel calcific CAD. No substantial pericardial effusion. Chronically prominent pulmonary trunk measuring 2.9 cm is unchanged. There are heavy calcifications and mild tortuosity of the thoracic aorta, patchy calcific plaques in the great vessels. There is no aortic aneurysm. There are calcifications along the aortic valve leaflets as well. Pulmonary veins are normal caliber. Mediastinum/Nodes: There are multiple borderline prominent anterior and middle mediastinal lymph nodes, fever 2 slightly prominent, largest is a subcarinal lymph node measuring 1  cm in short axis. There is no new or enlarging adenopathy. The thyroid gland, axillary spaces are unremarkable. The thoracic esophagus, thoracic trachea, and main bronchi are unremarkable. Lungs/Pleura: There is a small left posterior basal pleural effusion, which now appears loculated. Left lower lobe base adjacent atelectasis or consolidation which is unchanged in appearance. The lungs exhibit scattered linear scar-like opacities, likely postsurgical scarring in the right upper lobe, with a chronically elevated right diaphragm. There is a stable 8 mm noncalcified right lower lobe nodule on 5:85. Stable back to 01/23/2021. Left lower lobe volume loss again is suggested adjacent the pleural effusion. Central airways are patent. No new abnormality in the lung fields. Musculoskeletal: Extensive thoracic spine bridging enthesopathy no acute or other significant osseous findings. Spinal cord stimulator wiring again terminates at T6 and separately at T11. There is bridging enthesopathy along lower thoracic spinous processes. Mild gynecomastia. CT ABDOMEN PELVIS FINDINGS Hepatobiliary: Subcentimeter nonspecific hypodensity is again noted inferiorly in segment 6. No other focal liver abnormality is seen without contrast. There is increased intrahepatic and extrahepatic biliary dilatation post cholecystectomy, with common bile duct now 2 cm. The cystic duct remnant and the common bile duct are both packed with numerous subcentimeter rim calcified stones. I believe these were present previously at least on the last CT, but there was pneumobilia on that study confusing the picture. Pancreas: No focal abnormality or ductal dilatation is seen without contrast. Unchanged periampullary duodenal diverticulum. Spleen: There are calcified hilar arterial plaques. No splenomegaly. No other focal abnormality. Adrenals/Urinary Tract: Left nephrectomy. 1.1 cm hyperdense cyst upper pole right kidney. There are scattered collecting system  stones, medullary nephrocalcinosis and a 1.2 cm right renal pelvis/UPJ stone without hydronephrosis which I believe was also present on the last CT. Collecting system stones measure up to 2 cm. There are additional too small to characterize renal hypodensities in a few small cysts. There is no adrenal mass. No suspicious renal lesion without contrast. No right ureteral stone. There is mild bladder thickening and perivesical haziness consistent with cystitis. Stomach/Bowel: Somewhat elongated stomach is unchanged in appearance with mild thickened folds proximally. There is no small bowel obstruction or inflammation. There is advanced colonic diverticulosis. Chronic thickening in the wall of the sigmoid colon is unchanged, without overt inflammatory reaction. Increased rectal fecal retention is seen without findings of stercoral proctitis. Vascular/Lymphatic: There is heavy aortoiliac and visceral arterial calcific plaque no AAA. No adenopathy is seen. Reproductive: Prostate is normal in size. Other: There is hemoperitoneum with irregular Hematoma measuring 37-45 Hounsfield units collecting adjacent the left rectus sheath in the mid to lower abdomen measuring as much as 12 cm coronal, 3.4 cm AP and extending for a craniocaudal length up to 10 cm. There is a small volume of free hemorrhagic content in the distal paracolic gutters and pelvis. The hemorrhagic source is most likely from the left rectus  muscle/sheath as this is where the peritoneal dialysis catheter entered. There is mild body wall anasarca which was not seen previously. There is no free air or abscess. Mild mesenteric congestive features appear similar. Musculoskeletal: There is osteopenia with degenerative changes of the lumbar spine and chronic compression fracture of L2, moderate hip DJD. No destructive osseous or lytic lesions. In addition to bridging enthesopathy there are bridging syndesmophytes primarily of the thoracic spine. IMPRESSION: 1. Left  mid to lower abdominal hemoperitoneum, most likely from the left rectus sheath/sheath as this is where the peritoneal dialysis catheter entered and was recently removed. The largest amount is in a hematoma along the inner surface of the left abdominal wall. 2. Small volume of free hemorrhagic content in the distal paracolic gutters and pelvis. 3. Likely cystitis. 4. Small loculated left posterior basal pleural effusion with adjacent atelectasis or consolidation, unchanged. 5. Stable 8 mm right lower lobe nodule back to 01/23/2021. Attention on follow-up scans recommended. 6. Cardiomegaly with aortic and coronary artery atherosclerosis. Chronic prominence of the pulmonary trunk. 7. Stable borderline prominent mediastinal lymph nodes. 8. Increased intrahepatic and extrahepatic biliary dilatation post cholecystectomy, with cystic duct remnant and common bile duct both packed with numerous subcentimeter rim calcified stones. 9. Bilateral nephrolithiasis and medullary nephrocalcinosis with 1.2 cm right renal pelvis/UPJ stone without hydronephrosis. 10. Chronic thickening in the sigmoid colon without overt inflammatory reaction. Diffuse diverticulosis. 11. Increased rectal fecal retention. No findings of stercoral proctitis. 12. Osteopenia and degenerative/DISH change. Chronic L2 compression fracture. 13. Critical Value/emergent results were called by telephone at the time of interpretation on 07/15/2022 at 11:27 pm to provider [facility] , who verbally acknowledged these results. Electronically Signed   By: Almira Bar M.D.   On: 07/15/2022 23:37   CT Head Wo Contrast  Result Date: 07/15/2022 CLINICAL DATA:  Mental status change EXAM: CT HEAD WITHOUT CONTRAST TECHNIQUE: Contiguous axial images were obtained from the base of the skull through the vertex without intravenous contrast. RADIATION DOSE REDUCTION: This exam was performed according to the departmental dose-optimization program which includes automated  exposure control, adjustment of the mA and/or kV according to patient size and/or use of iterative reconstruction technique. COMPARISON:  CT 12/15/2021 FINDINGS: Brain: No evidence of acute infarction, hemorrhage, hydrocephalus, extra-axial collection or mass lesion/mass effect. There is moderate diffuse atrophy. There is moderate patchy periventricular and deep white matter hypodensity, likely chronic small vessel ischemic change. This is similar to prior. 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 Vascular: There are atherosclerotic calcifications of the intracranial internal carotid arteries, vertebral arteries and peripheral vasculature. Skull: Normal. Negative for fracture or focal lesion. Sinuses/Orbits: No acute finding. Other: None. IMPRESSION: 1. No acute intracranial process. 2. Moderate chronic small vessel ischemic change and atrophy. Electronically Signed   By: Darliss Cheney M.D.   On: 07/15/2022 22:52   DG Hand Complete Right  Result Date: 07/15/2022 CLINICAL DATA:  Pain.  Necrotic fourth digit. EXAM: RIGHT HAND - COMPLETE 3+ VIEW COMPARISON:  Hand radiograph 06/04/2022 FINDINGS: The soft tissues of the fourth digit distally are atrophic at the level of the distal phalanx and distal aspect of the proximal phalanx, new from prior exam. No soft tissue gas. There are extensive vascular calcifications. IV catheter is seen overlying the dorsum of the wrist. Multifocal osteoarthritis. No fracture. No erosive or bony destructive change or periostitis. IMPRESSION: 1. Atrophic soft tissues of the fourth digit distally at the level of the distal phalanx and distal aspect of the  proximal phalanx, new from prior exam. No soft tissue gas or findings of osteomyelitis. 2. Multifocal osteoarthritis. Electronically Signed   By: Narda Rutherford M.D.   On: 07/15/2022 19:39   DG Chest Port 1 View  Result Date: 07/15/2022 CLINICAL DATA:  Shortness of breath. EXAM: PORTABLE CHEST 1 VIEW COMPARISON:   Chest radiograph dated 05/07/2022. FINDINGS: Shallow inspiration. Small left pleural effusion and left lung base atelectasis or infiltrate as seen on the prior radiograph and CT of 12/15/2021. The right lung is clear. Postsurgical changes of the right upper lobe. No pneumothorax. Stable cardiac silhouette. No acute osseous pathology. Thoracic spine stimulator. IMPRESSION: Small left pleural effusion and left lung base atelectasis or infiltrate. Electronically Signed   By: Elgie Collard M.D.   On: 07/15/2022 19:35   Medications:   ceFAZolin (ANCEF) IV     piperacillin-tazobactam (ZOSYN)  IV 2.25 g (07/17/22 0607)   vancomycin      buprenorphine  2 mg Sublingual QHS   busPIRone  10 mg Oral QHS   busPIRone  20 mg Oral Daily   calcitRIOL  0.25 mcg Oral Daily   calcium acetate  667 mg Oral TID with meals   Chlorhexidine Gluconate Cloth  6 each Topical Q0600   escitalopram  20 mg Oral Daily   levothyroxine  250 mcg Oral QAC breakfast   midodrine  10 mg Oral Q T,Th,Sa-HD   pantoprazole  40 mg Oral BID   rosuvastatin  10 mg Oral QHS     OP HD: [facility] [phone]) 3.5 hrs 2.0 K/2.5 Ca 88 kg AVF  - Heparin 4950 units IV (3700 units IV initial bolus then 500 units IV hourly-stop 1 hour prior to end of tx.) -Epogen 10800 units IV TIW    Labs --> Na 139  K 3.8  CO2 25  bun 18  creat 3.31  alb 2.5  Hb 9.1 wbc 7k     Assessment/ Plan: AMS - likely due to sepsis. CT head negative. Per pmd.  Ischemic/ gangrenous R hand finger #2 - IV abx started per pmd. Going for amputation tomorrow.  ESRD - on HD TTS.HD today on schedule. Added heparin-clotted system twice. Have called [facility] for orders.  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  MBD ckd - CCa in range, will add on phos.  Chronic pain syndrome - on buprenorphine PAD - on asa and plavix DNR at OP clinic    Matasha Smigelski H. Ferrell Claiborne NP-C 07/17/2022, 9:40 AM  [employer] [phone]

## 2022-07-17 NOTE — Op Note (Signed)
OPERATIVE NOTE  DATE OF PROCEDURE: 07/17/2022  SURGEONS:  Primary: Gomez Cleverly, MD  ASSISTANT: Payton Mccallum, PA-C  Due to the complexity of the surgery an assistant was necessary to aid in retraction, exposure, limb positioning, closure and dressing application. The use of an assistant on this case follows CMS and CPT guidelines, which allows an assistant to be used because of the complexity level of this case.   PREOPERATIVE DIAGNOSIS: infection / gangrene infection, Right ring and index fingers  POSTOPERATIVE DIAGNOSIS: Same  NAME OF PROCEDURE:   Right ring finger amputation with traction neurectomies and primary closure through P1 Right index finger amputation with traction neurectomies and primary closure through P2  ANESTHESIA: Monitor Anesthesia Care + Local  SKIN PREPARATION: Hibiclens  ESTIMATED BLOOD LOSS: Minimal  IMPLANTS: none  INDICATIONS:  Chase [name] is a 72 y.o. male who has the above preoperative diagnosis. The patient has decided to proceed with surgical intervention.  Risks, benefits and alternatives of operative management were discussed including, but not limited to, risks of anesthesia complications, infection, pain, persistent symptoms, stiffness, need for future surgery.  The patient and his family member who makes decisions for him understands, agrees and elects to proceed with surgery.  The right index and ring fingers have clear demarcation of level of ischemia and dead tissue. Due to recent admission for altered mental status, worsening pain and plan for removal of devitalized/necrotic tissue to avoid infection, plan for surgical intervention.  DESCRIPTION OF PROCEDURE: The patient was met in the pre-operative area and their identity was verified.  The operative location and laterality was also verified and marked.  The patient was brought to the OR and was placed supine on the table.  After repeat patient identification with the operative team anesthesia was  provided and the patient was prepped and draped in the usual sterile fashion.  A final timeout was performed verifying the correction patient, procedure, location and laterality.  Finger tourniquets were utilized at the base of the right index finger and right ring finger and fishmouth incisions were made and designed.  The right index finger amputation site was just proximal to the DIP joint and the right ring finger was through the mid aspect of the proximal phalanx.  Full-thickness flaps were created and bone cutters and rongeurs were utilized to perform bony resections in order to allow for skin closure without tension.  The extensor tendon and flexor tendons were divided and retracted the digital neurovascular bundles were identified cauterized and traction neurectomies were performed.  The wounds were thoroughly irrigated with no signs of infection.  The bone ends were smoothed with a rasp.  The skin was then closed without tension with absorbable chromic sutures.  Sterile soft bandage was applied.  The finger tourniquets were removed and the digits remained warm and well-perfused.  This was all done under digital block with some mild sedation.  The patient had a total of 26 cc of local via digital blocks to both digits with a mixture of 1% plain lidocaine and quarter percent plain Marcaine.  The patient tolerated the procedure well was awoken and brought to PACU for recovery in stable condition.  All counts were correct x 2.  Postoperative plan: Postoperative day 2 or 3 the bandages can be changed with nonadherent at the tip of the digit followed by a soft wrap.  The patient should not be encouraged to continue finger range of motion to avoid stiffness to the uninvolved digits and can elevate to help with  swelling.  Patient will follow-up with me in 1 week for wound check in the office upon discharge.   Philipp Ovens, MD

## 2022-07-17 NOTE — Anesthesia Procedure Notes (Signed)
Procedure Name: MAC Date/Time: 07/17/2022 4:07 PM  Performed by: Aundria Rud, CRNAPre-anesthesia Checklist: Patient identified, Emergency Drugs available, Suction available and Patient being monitored Patient Re-evaluated:Patient Re-evaluated prior to induction Oxygen Delivery Method: Simple face mask Preoxygenation: Pre-oxygenation with 100% oxygen Induction Type: IV induction Placement Confirmation: positive ETCO2 and CO2 detector Dental Injury: Teeth and Oropharynx as per pre-operative assessment

## 2022-07-17 NOTE — Plan of Care (Signed)

## 2022-07-17 NOTE — Progress Notes (Signed)
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 The risks, benefits and alternatives of surgical intervention were discussed and informed consent was obtained prior to surgery.  Past Medical History:  Past Medical History:  Diagnosis Date   Anemia    Back pain with radiation    Coronary artery disease    Depression    Diabetes mellitus without complication (HCC)    Dialysis patient (HCC)    M, W, F   History of traumatic head injury    Hypertension    Renal disorder    Thyroid disease     Past Surgical History:  Past Surgical History:  Procedure Laterality Date   CAPD REMOVAL N/A 06/30/2022   Procedure: REMOVAL CONTINUOUS AMBULATORY PERITONEAL DIALYSIS  (CAPD) CATHETER;  Surgeon: Victorino Sparrow, MD;  Location: [facility] OR;  Service: Vascular;  Laterality: N/A;   ERCP     PERIPHERAL VASCULAR BALLOON ANGIOPLASTY Right 06/25/2022   Procedure: PERIPHERAL VASCULAR BALLOON ANGIOPLASTY;  Surgeon: Cephus Shelling, MD;  Location: [facility] INVASIVE CV LAB;  Service: Cardiovascular;  Laterality: Right;  Radial   UPPER EXTREMITY ANGIOGRAPHY N/A 06/25/2022   Procedure: Upper Extremity Angiography;  Surgeon: Cephus Shelling, MD;  Location: [facility] INVASIVE CV LAB;  Service: Cardiovascular;  Laterality: N/A;    Medications:  Prior to Admission medications   Medication Sig Start Date End Date Taking? Authorizing Provider  acetaminophen (TYLENOL) 500 MG tablet Take 1,000 mg by mouth every 8 (eight) hours as needed for moderate pain or mild pain.   Yes [provider]  albuterol (VENTOLIN HFA) 108 (90 Base) MCG/ACT inhaler Inhale 2 puffs  into the lungs every 4 (four) hours as needed for wheezing or shortness of breath.   Yes [provider]  aspirin EC 81 MG tablet Take 1 tablet (81 mg total) by mouth daily. Swallow whole. 07/02/22  Yes Rodolph Bong, MD  buprenorphine (SUBUTEX) 2 MG SUBL SL tablet Place 1 tablet (2 mg total) under the tongue at bedtime. 07/01/22  Yes Rodolph Bong, MD  busPIRone (BUSPAR) 10 MG tablet Take 1-2 tablets (10-20 mg total) by mouth See admin instructions. Take 2 tablets in the AM and 1 tablet in the PM. Patient taking differently: Take 10-20 mg by mouth See admin instructions. Take 2 tablets by mouth in the morning and 1 tablet in the at bedtime 07/01/22  Yes Rodolph Bong, MD  calcitRIOL (ROCALTROL) 0.25 MCG capsule Take 0.25 mcg by mouth daily.   Yes [provider]  Calcium Acetate 667 MG TABS Take 1 tablet by mouth 3 (three) times daily.   Yes [provider]  clopidogrel (PLAVIX) 75 MG tablet Take 1 tablet (75 mg total) by mouth daily. 07/02/22  Yes Rodolph Bong, MD  Darbepoetin Alfa (ARANESP) 100 MCG/0.5ML SOSY injection Inject 0.5 mLs (100 mcg total) into the skin every Thursday at 6pm. 07/03/22  Yes Rodolph Bong, MD  doxycycline (VIBRAMYCIN) 100 MG capsule Take 100 mg by mouth 2 (two) times daily. 07/08/22  Yes [provider]  escitalopram (LEXAPRO) 20 MG tablet Take 20 mg by mouth daily.   Yes [provider]  levothyroxine (SYNTHROID)  125 MCG tablet Take 250 mcg by mouth daily before breakfast.   Yes [provider]  lidocaine (LMX) 4 % cream Apply 1 Application topically once a week. Apply to fistula  prior to Dialysis- Tue, Thurs, Sat   Yes [provider]  melatonin 3 MG TABS tablet Take 3 mg by mouth at bedtime.   Yes [provider]  midodrine (PROAMATINE) 10 MG tablet Take 1 tablet (10 mg total) by mouth Every Tuesday,Thursday,and Saturday with dialysis. Patient taking differently: Take 10 mg by  mouth Every Tuesday,Thursday,and Saturday with dialysis. May also give 1 tablet for sBP less than 90 07/03/22  Yes Rodolph Bong, MD  oxyCODONE (OXY IR/ROXICODONE) 5 MG immediate release tablet Take 1 tablet (5 mg total) by mouth every 4 (four) hours as needed for moderate pain. Patient taking differently: Take 2.5 mg by mouth every 6 (six) hours as needed for moderate pain. 07/01/22  Yes Rodolph Bong, MD  pantoprazole (PROTONIX) 40 MG tablet Take 40 mg by mouth 2 (two) times daily. For GERD   Yes [provider]  rosuvastatin (CRESTOR) 10 MG tablet Take 10 mg by mouth at bedtime.   Yes [provider]    Allergies:  Gabapentin, Lipitor [atorvastatin], Nsaids, and Pregabalin  Review of Systems: Negative except per HPI.  Physical Exam: Alert and oriented, NAD Head and neck: no masses, normal alignment CV: pulse intact Pulm: no increased work of breathing, respirations even and unlabored Abdomen: non-distended Extremities: right ring finger necrotic, right index, middle and small fingers cool   LABS: Recent Results (from the past 2160 hour(s))  CBC with Differential     Status: Abnormal   Collection Time: 06/04/22  1:41 PM  Result Value Ref Range   WBC 8.9 4.0 - 10.5 K/uL   RBC 2.53 (L) 4.22 - 5.81 MIL/uL   Hemoglobin 7.7 (L) 13.0 - 17.0 g/dL   HCT 16.1 (L) 09.6 - 04.5 %   MCV 101.6 (H) 80.0 - 100.0 fL   MCH 30.4 26.0 - 34.0 pg   MCHC 30.0 30.0 - 36.0 g/dL   RDW 40.9 (H) 81.1 - 91.4 %   Platelets 148 (L) 150 - 400 K/uL   nRBC 0.0 0.0 - 0.2 %   Neutrophils Relative % 75 %   Neutro Abs 6.6 1.7 - 7.7 K/uL   Lymphocytes Relative 12 %   Lymphs Abs 1.1 0.7 - 4.0 K/uL   Monocytes Relative 7 %   Monocytes Absolute 0.6 0.1 - 1.0 K/uL   Eosinophils Relative 5 %   Eosinophils Absolute 0.4 0.0 - 0.5 K/uL   Basophils Relative 1 %   Basophils Absolute 0.1 0.0 - 0.1 K/uL   Immature Granulocytes 0 %   Abs Immature Granulocytes 0.03 0.00 - 0.07 K/uL    Comment:  Performed at [facility], [address], [place], [place] [address]  Basic metabolic panel     Status: Abnormal   Collection Time: 06/04/22  1:41 PM  Result Value Ref Range   Sodium 136 135 - 145 mmol/L   Potassium 4.5 3.5 - 5.1 mmol/L   Chloride 99 98 - 111 mmol/L   CO2 27 22 - 32 mmol/L   Glucose, Bld 116 (H) 70 - 99 mg/dL    Comment: Glucose reference range applies only to samples taken after fasting for at least 8 hours.   BUN 33 (H) 8 - 23 mg/dL   Creatinine, Ser 6.21 (H) 0.61 - 1.24 mg/dL  Calcium 8.7 (L) 8.9 - 10.3 mg/dL   GFR, Estimated 15 (L) >60 mL/min    Comment: (NOTE) Calculated using the CKD-EPI Creatinine Equation (2021)    Anion gap 10 5 - 15    Comment: Performed at [facility], [address], [place], [place] [address]  Hepatitis B surface antigen     Status: None   Collection Time: 06/23/22  2:06 PM  Result Value Ref Range   Hepatitis B Surface Ag NON REACTIVE NON REACTIVE    Comment: Performed at [facility], [address], [place], [place] [address]  I-stat chem 8, ED (not at [facility] or [facility])     Status: Abnormal   Collection Time: 06/23/22  5:27 PM  Result Value Ref Range   Sodium 134 (L) 135 - 145 mmol/L   Potassium 4.6 3.5 - 5.1 mmol/L   Chloride 100 98 - 111 mmol/L   BUN 52 (H) 8 - 23 mg/dL   Creatinine, Ser 0.98 (H) 0.61 - 1.24 mg/dL   Glucose, Bld 119 (H) 70 - 99 mg/dL    Comment: Glucose reference range applies only to samples taken after fasting for at least 8 hours.   Calcium, Ion 1.10 (L) 1.15 - 1.40 mmol/L   TCO2 25 22 - 32 mmol/L   Hemoglobin 8.5 (L) 13.0 - 17.0 g/dL   HCT 14.7 (L) 82.9 - 56.2 %  CBC     Status: Abnormal   Collection Time: 06/23/22  5:55 PM  Result Value Ref Range   WBC 8.1 4.0 - 10.5 K/uL   RBC 2.56 (L) 4.22 - 5.81 MIL/uL   Hemoglobin 8.0 (L) 13.0 - 17.0 g/dL   HCT 13.0 (L) 86.5 - 78.4 %   MCV 102.3 (H) 80.0 - 100.0 fL   MCH 31.3 26.0 - 34.0 pg   MCHC 30.5 30.0 - 36.0  g/dL   RDW 69.6 (H) 29.5 - 28.4 %   Platelets 177 150 - 400 K/uL   nRBC 0.0 0.0 - 0.2 %    Comment: Performed at [facility], [address], [place], [place] [address]  Lactic acid, plasma     Status: None   Collection Time: 06/23/22  5:55 PM  Result Value Ref Range   Lactic Acid, Venous 1.1 0.5 - 1.9 mmol/L    Comment: Performed at [facility], [address], [place], [place] [address]  Comprehensive metabolic panel     Status: Abnormal   Collection Time: 06/23/22  5:55 PM  Result Value Ref Range   Sodium 134 (L) 135 - 145 mmol/L   Potassium 4.5 3.5 - 5.1 mmol/L   Chloride 97 (L) 98 - 111 mmol/L   CO2 26 22 - 32 mmol/L   Glucose, Bld 129 (H) 70 - 99 mg/dL    Comment: Glucose reference range applies only to samples taken after fasting for at least 8 hours.   BUN 56 (H) 8 - 23 mg/dL   Creatinine, Ser 2.53 (H) 0.61 - 1.24 mg/dL   Calcium 8.6 (L) 8.9 - 10.3 mg/dL   Total Protein 6.1 (L) 6.5 - 8.1 g/dL   Albumin 2.5 (L) 3.5 - 5.0 g/dL   AST 14 (L) 15 - 41 U/L   ALT 12 0 - 44 U/L   Alkaline Phosphatase 138 (H) 38 - 126 U/L   Total Bilirubin 0.6 0.3 - 1.2 mg/dL   GFR, Estimated 12 (L) >60 mL/min    Comment: (NOTE) Calculated  using the CKD-EPI Creatinine Equation (2021)    Anion gap 11 5 - 15    Comment: Performed at [facility], [address] [place], [place] [address]  Protime-INR     Status: Abnormal   Collection Time: 06/23/22  5:55 PM  Result Value Ref Range   Prothrombin Time 16.6 (H) 11.4 - 15.2 seconds   INR 1.3 (H) 0.8 - 1.2    Comment: (NOTE) INR goal varies based on device and disease states. Performed at [facility], [address], [place], [place] [address]   APTT     Status: None   Collection Time: 06/23/22  5:55 PM  Result Value Ref Range   aPTT 36 24 - 36 seconds    Comment: Performed at [facility], [address], [place], [place] [address]  Lactic acid,  plasma     Status: None   Collection Time: 06/23/22  9:12 PM  Result Value Ref Range   Lactic Acid, Venous 0.8 0.5 - 1.9 mmol/L    Comment: Performed at [facility], [address], [place], [place] [address]  HIV Antibody (routine testing w rflx)     Status: None   Collection Time: 06/23/22  9:12 PM  Result Value Ref Range   HIV Screen 4th Generation wRfx Non Reactive Non Reactive    Comment: Performed at [facility], [address], [place], [place] [address]  Surgical PCR screen     Status: None   Collection Time: 06/23/22  9:44 PM   Specimen: Nasal Mucosa; Nasal Swab  Result Value Ref Range   MRSA, PCR NEGATIVE NEGATIVE   Staphylococcus aureus NEGATIVE NEGATIVE    Comment: (NOTE) The Xpert SA Assay (FDA approved for NASAL specimens in patients 65 years of age and older), is one component of a comprehensive surveillance program. It is not intended to diagnose infection nor to guide or monitor treatment. Performed at [facility], [address] [place], [place] [address]   Basic metabolic panel     Status: Abnormal   Collection Time: 06/24/22  1:22 AM  Result Value Ref Range   Sodium 135 135 - 145 mmol/L   Potassium 5.0 3.5 - 5.1 mmol/L   Chloride 97 (L) 98 - 111 mmol/L   CO2 25 22 - 32 mmol/L   Glucose, Bld 92 70 - 99 mg/dL    Comment: Glucose reference range applies only to samples taken after fasting for at least 8 hours.   BUN 59 (H) 8 - 23 mg/dL   Creatinine, Ser 5.28 (H) 0.61 - 1.24 mg/dL   Calcium 8.9 8.9 - 41.3 mg/dL   GFR, Estimated 11 (L) >60 mL/min    Comment: (NOTE) Calculated using the CKD-EPI Creatinine Equation (2021)    Anion gap 13 5 - 15    Comment: Performed at [facility], [address] [place], [place] [address]  CBC     Status: Abnormal   Collection Time: 06/24/22  1:22 AM  Result Value Ref Range   WBC 8.5 4.0 - 10.5 K/uL   RBC 2.54 (L) 4.22 - 5.81 MIL/uL   Hemoglobin 8.0 (L) 13.0 - 17.0 g/dL   HCT 02.7 (L) 25.3 -  52.0 %   MCV 101.2 (H) 80.0 - 100.0 fL   MCH 31.5 26.0 - 34.0 pg   MCHC 31.1 30.0 - 36.0 g/dL   RDW 66.4 (H) 40.3 - 47.4 %   Platelets  193 150 - 400 K/uL   nRBC 0.0 0.0 - 0.2 %    Comment: Performed at [facility], [address] [place], [place] [address]  Hepatitis B surface antibody,quantitative     Status: Abnormal   Collection Time: 06/24/22  7:18 PM  Result Value Ref Range   Hep B S AB Quant (Post) 9.9 (L) Immunity>9.9 mIU/mL    Comment: (NOTE)  Status of Immunity                     Anti-HBs Level  ------------------                     -------------- Inconsistent with Immunity                   0.0 - 9.9 Consistent with Immunity                          >9.9 **Effective July 07, 2022 the reference interval**  will be changing to: Immunity >10 Performed At: [facility] [address] [place], [place] [id number] Jolene Schimke MD [id number]   CBC with Differential/Platelet     Status: Abnormal   Collection Time: 06/25/22  2:59 AM  Result Value Ref Range   WBC 7.0 4.0 - 10.5 K/uL   RBC 2.41 (L) 4.22 - 5.81 MIL/uL   Hemoglobin 7.3 (L) 13.0 - 17.0 g/dL   HCT 30.8 (L) 65.7 - 84.6 %   MCV 99.2 80.0 - 100.0 fL   MCH 30.3 26.0 - 34.0 pg   MCHC 30.5 30.0 - 36.0 g/dL   RDW 96.2 (H) 95.2 - 84.1 %   Platelets 188 150 - 400 K/uL   nRBC 0.0 0.0 - 0.2 %   Neutrophils Relative % 72 %   Neutro Abs 5.1 1.7 - 7.7 K/uL   Lymphocytes Relative 15 %   Lymphs Abs 1.0 0.7 - 4.0 K/uL   Monocytes Relative 7 %   Monocytes Absolute 0.5 0.1 - 1.0 K/uL   Eosinophils Relative 6 %   Eosinophils Absolute 0.4 0.0 - 0.5 K/uL   Basophils Relative 0 %   Basophils Absolute 0.0 0.0 - 0.1 K/uL   Immature Granulocytes 0 %   Abs Immature Granulocytes 0.02 0.00 - 0.07 K/uL    Comment: Performed at [facility], [address], [place], [place] [address]  Comprehensive metabolic panel     Status: Abnormal   Collection Time: 06/25/22  2:59 AM  Result Value Ref Range   Sodium 133  (L) 135 - 145 mmol/L   Potassium 5.2 (H) 3.5 - 5.1 mmol/L   Chloride 98 98 - 111 mmol/L   CO2 22 22 - 32 mmol/L   Glucose, Bld 88 70 - 99 mg/dL    Comment: Glucose reference range applies only to samples taken after fasting for at least 8 hours.   BUN 66 (H) 8 - 23 mg/dL   Creatinine, Ser 1.02 (H) 0.61 - 1.24 mg/dL   Calcium 8.3 (L) 8.9 - 10.3 mg/dL   Total Protein 5.8 (L) 6.5 - 8.1 g/dL   Albumin 2.3 (L) 3.5 - 5.0 g/dL   AST 19 15 - 41 U/L   ALT 11 0 - 44 U/L   Alkaline Phosphatase 113 38 - 126 U/L   Total Bilirubin 0.3 0.3 - 1.2 mg/dL   GFR, Estimated 9 (L) >60 mL/min    Comment: (NOTE) Calculated  using the CKD-EPI Creatinine Equation (2021)    Anion gap 13 5 - 15    Comment: Performed at [facility], [address], [place], [place] [address]  Magnesium     Status: Abnormal   Collection Time: 06/25/22  2:59 AM  Result Value Ref Range   Magnesium 1.4 (L) 1.7 - 2.4 mg/dL    Comment: Performed at [facility], [address] [place], [place] [address]  Phosphorus     Status: Abnormal   Collection Time: 06/25/22  2:59 AM  Result Value Ref Range   Phosphorus 5.6 (H) 2.5 - 4.6 mg/dL    Comment: Performed at [facility], [address], [place], [place] [address]  Vitamin B12     Status: None   Collection Time: 06/25/22  2:59 AM  Result Value Ref Range   Vitamin B-12 894 180 - 914 pg/mL    Comment: (NOTE) This assay is not validated for testing neonatal or myeloproliferative syndrome specimens for Vitamin B12 levels. Performed at [facility], [address], [place], [place] [address]   Folate     Status: None   Collection Time: 06/25/22  2:59 AM  Result Value Ref Range   Folate 11.8 >5.9 ng/mL    Comment: Performed at [facility], [address], [place], [place] [address]  Iron and TIBC     Status: Abnormal   Collection Time: 06/25/22  2:59 AM  Result Value Ref Range   Iron 19 (L) 45 - 182 ug/dL   TIBC 130 (L) 865 - 784 ug/dL    Saturation Ratios 12 (L) 17.9 - 39.5 %   UIBC 145 ug/dL    Comment: Performed at [facility], [address] [place], [place] [address]  Ferritin     Status: Abnormal   Collection Time: 06/25/22  2:59 AM  Result Value Ref Range   Ferritin 793 (H) 24 - 336 ng/mL    Comment: Performed at [facility], [address], [place], [place] [address]  Reticulocytes     Status: Abnormal   Collection Time: 06/25/22  2:59 AM  Result Value Ref Range   Retic Ct Pct 3.2 (H) 0.4 - 3.1 %   RBC. 2.39 (L) 4.22 - 5.81 MIL/uL   Retic Count, Absolute 77.0 19.0 - 186.0 K/uL   Immature Retic Fract 20.8 (H) 2.3 - 15.9 %    Comment: Performed at [facility], [address] [place], [place] [address]  Magnesium     Status: Abnormal   Collection Time: 06/26/22  1:27 AM  Result Value Ref Range   Magnesium 1.4 (L) 1.7 - 2.4 mg/dL    Comment: Performed at [facility], [address], [place], [place] [address]  Renal function panel     Status: Abnormal   Collection Time: 06/26/22  1:27 AM  Result Value Ref Range   Sodium 137 135 - 145 mmol/L   Potassium 4.2 3.5 - 5.1 mmol/L   Chloride 97 (L) 98 - 111 mmol/L   CO2 27 22 - 32 mmol/L   Glucose, Bld 84 70 - 99 mg/dL    Comment: Glucose reference range applies only to samples taken after fasting for at least 8 hours.   BUN 32 (H) 8 - 23 mg/dL   Creatinine, Ser 5.36 (H) 0.61 - 1.24 mg/dL   Calcium 8.3 (L) 8.9 - 10.3 mg/dL   Phosphorus 4.4 2.5 -  4.6 mg/dL   Albumin 2.1 (L) 3.5 - 5.0 g/dL   GFR, Estimated 15 (L) >60 mL/min    Comment: (NOTE) Calculated using the CKD-EPI Creatinine Equation (2021)    Anion gap 13 5 - 15    Comment: Performed at [facility], [address] [place], [place] [address]  CBC with Differential/Platelet     Status: Abnormal   Collection Time: 06/26/22  1:27 AM  Result Value Ref Range   WBC 5.7 4.0 - 10.5 K/uL   RBC 2.31 (L) 4.22 - 5.81 MIL/uL   Hemoglobin 7.0 (L) 13.0 - 17.0 g/dL   HCT 60.4 (L)  54.0 - 52.0 %   MCV 100.0 80.0 - 100.0 fL   MCH 30.3 26.0 - 34.0 pg   MCHC 30.3 30.0 - 36.0 g/dL   RDW 98.1 (H) 19.1 - 47.8 %   Platelets 163 150 - 400 K/uL   nRBC 0.0 0.0 - 0.2 %   Neutrophils Relative % 67 %   Neutro Abs 3.8 1.7 - 7.7 K/uL   Lymphocytes Relative 17 %   Lymphs Abs 1.0 0.7 - 4.0 K/uL   Monocytes Relative 9 %   Monocytes Absolute 0.5 0.1 - 1.0 K/uL   Eosinophils Relative 6 %   Eosinophils Absolute 0.3 0.0 - 0.5 K/uL   Basophils Relative 1 %   Basophils Absolute 0.0 0.0 - 0.1 K/uL   Immature Granulocytes 0 %   Abs Immature Granulocytes 0.01 0.00 - 0.07 K/uL    Comment: Performed at [facility], [address] [place], [place] [address]  ABO/Rh     Status: None   Collection Time: 06/26/22  1:27 AM  Result Value Ref Range   ABO/RH(D)      O POS Performed at [facility], [address], [place], [place] [address]   Type and screen [facility]     Status: None   Collection Time: 06/26/22  8:34 AM  Result Value Ref Range   ABO/RH(D) O POS    Antibody Screen NEG    Sample Expiration 06/29/2022,2359    Unit Number [id number]    Blood Component Type RED CELLS,LR    Unit division 00    Status of Unit ISSUED,FINAL    Transfusion Status OK TO TRANSFUSE    Crossmatch Result Compatible    Unit Number [id number]    Blood Component Type RED CELLS,LR    Unit division 00    Status of Unit REL FROM [facility]    Transfusion Status OK TO TRANSFUSE    Crossmatch Result      Compatible Performed at [facility], [address] [place], [place] [address]   Prepare RBC (crossmatch)     Status: None   Collection Time: 06/26/22  8:34 AM  Result Value Ref Range   Order Confirmation      ORDER PROCESSED BY BLOOD BANK Performed at [facility], [address], [place], [place] [address]   BPAM RBC     Status: None   Collection Time: 06/26/22  8:34 AM  Result Value Ref Range   ISSUE DATE / TIME [id number]    Blood Product  Unit Number [id number]    PRODUCT CODE [id number]    Unit Type and Rh 5100    Blood Product Expiration Date 016010932355    ISSUE DATE / TIME 732202542706    Blood Product Unit Number [id number]    PRODUCT CODE [id number]  Unit Type and Rh 5100    Blood Product Expiration Date 962952841324   Lipid panel     Status: Abnormal   Collection Time: 06/26/22  8:38 AM  Result Value Ref Range   Cholesterol 85 0 - 200 mg/dL   Triglycerides 96 <401 mg/dL   HDL 27 (L) >02 mg/dL   Total CHOL/HDL Ratio 3.1 RATIO   VLDL 19 0 - 40 mg/dL   LDL Cholesterol 39 0 - 99 mg/dL    Comment:        Total Cholesterol/HDL:CHD Risk Coronary Heart Disease Risk Table                     Men   Women  1/2 Average Risk   3.4   3.3  Average Risk       5.0   4.4  2 X Average Risk   9.6   7.1  3 X Average Risk  23.4   11.0        Use the calculated Patient Ratio above and the CHD Risk Table to determine the patient's CHD Risk.        ATP III CLASSIFICATION (LDL):  <100     mg/dL   Optimal  725-366  mg/dL   Near or Above                    Optimal  130-159  mg/dL   Borderline  440-347  mg/dL   High  >425     mg/dL   Very High Performed at [facility], [address], [place], [place] [address]   Hemoglobin and hematocrit, blood     Status: Abnormal   Collection Time: 06/26/22  7:13 PM  Result Value Ref Range   Hemoglobin 8.8 (L) 13.0 - 17.0 g/dL    Comment: REPEATED TO VERIFY POST TRANSFUSION SPECIMEN    HCT 28.4 (L) 39.0 - 52.0 %    Comment: Performed at [facility], [address], [place], [place] [address]  CBC with Differential/Platelet     Status: Abnormal   Collection Time: 06/27/22  8:05 AM  Result Value Ref Range   WBC 7.1 4.0 - 10.5 K/uL   RBC 2.63 (L) 4.22 - 5.81 MIL/uL   Hemoglobin 7.8 (L) 13.0 - 17.0 g/dL   HCT 32.9 (L) 51.8 - 84.1 %   MCV 97.7 80.0 - 100.0 fL   MCH 29.7 26.0 - 34.0 pg   MCHC 30.4 30.0 - 36.0 g/dL   RDW 66.0 (H) 63.0 - 16.0 %   Platelets 180 150 -  400 K/uL   nRBC 0.0 0.0 - 0.2 %   Neutrophils Relative % 73 %   Neutro Abs 5.2 1.7 - 7.7 K/uL   Lymphocytes Relative 15 %   Lymphs Abs 1.0 0.7 - 4.0 K/uL   Monocytes Relative 7 %   Monocytes Absolute 0.5 0.1 - 1.0 K/uL   Eosinophils Relative 5 %   Eosinophils Absolute 0.3 0.0 - 0.5 K/uL   Basophils Relative 0 %   Basophils Absolute 0.0 0.0 - 0.1 K/uL   Immature Granulocytes 0 %   Abs Immature Granulocytes 0.01 0.00 - 0.07 K/uL    Comment: Performed at [facility], [address], [place], [place] [address]  Renal function panel     Status: Abnormal   Collection Time: 06/27/22  8:05 AM  Result Value Ref Range   Sodium 134 (L) 135 - 145 mmol/L  Potassium 4.3 3.5 - 5.1 mmol/L   Chloride 96 (L) 98 - 111 mmol/L   CO2 24 22 - 32 mmol/L   Glucose, Bld 106 (H) 70 - 99 mg/dL    Comment: Glucose reference range applies only to samples taken after fasting for at least 8 hours.   BUN 46 (H) 8 - 23 mg/dL   Creatinine, Ser 1.61 (H) 0.61 - 1.24 mg/dL   Calcium 8.5 (L) 8.9 - 10.3 mg/dL   Phosphorus 6.0 (H) 2.5 - 4.6 mg/dL   Albumin 2.3 (L) 3.5 - 5.0 g/dL   GFR, Estimated 10 (L) >60 mL/min    Comment: (NOTE) Calculated using the CKD-EPI Creatinine Equation (2021)    Anion gap 14 5 - 15    Comment: Performed at [facility], [address] [place], [place] [address]  Magnesium     Status: Abnormal   Collection Time: 06/27/22  8:05 AM  Result Value Ref Range   Magnesium 1.6 (L) 1.7 - 2.4 mg/dL    Comment: Performed at [facility], [address] [place], [place] [address]  Urine Culture (for pregnant, neutropenic or urologic patients or patients with an indwelling urinary catheter)     Status: None   Collection Time: 06/27/22  8:07 AM   Specimen: Urine, Clean Catch  Result Value Ref Range   Specimen Description URINE, CLEAN CATCH    Special Requests NONE    Culture      NO GROWTH Performed at [facility], [address], [place], [place] [address]     Report Status 06/28/2022 FINAL   Urinalysis, Routine w reflex microscopic -Urine, Clean Catch     Status: Abnormal   Collection Time: 06/27/22  1:51 PM  Result Value Ref Range   Color, Urine STRAW (A) YELLOW   APPearance CLEAR CLEAR   Specific Gravity, Urine 1.006 1.005 - 1.030   pH 9.0 (H) 5.0 - 8.0   Glucose, UA 150 (A) NEGATIVE mg/dL   Hgb urine dipstick LARGE (A) NEGATIVE   Bilirubin Urine NEGATIVE NEGATIVE   Ketones, ur NEGATIVE NEGATIVE mg/dL   Protein, ur 30 (A) NEGATIVE mg/dL   Nitrite NEGATIVE NEGATIVE   Leukocytes,Ua MODERATE (A) NEGATIVE   RBC / HPF >50 0 - 5 RBC/hpf   WBC, UA 21-50 0 - 5 WBC/hpf   Bacteria, UA RARE (A) NONE SEEN   Squamous Epithelial / HPF 0-5 0 - 5 /HPF    Comment: Performed at [facility], [address] [place], [place] [address]  Magnesium     Status: Abnormal   Collection Time: 06/28/22  1:30 AM  Result Value Ref Range   Magnesium 1.4 (L) 1.7 - 2.4 mg/dL    Comment: Performed at [facility], [address] [place], [place] [address]  CBC     Status: Abnormal   Collection Time: 06/28/22  1:30 AM  Result Value Ref Range   WBC 6.5 4.0 - 10.5 K/uL   RBC 2.65 (L) 4.22 - 5.81 MIL/uL   Hemoglobin 8.0 (L) 13.0 - 17.0 g/dL   HCT 86.5 (L) 78.4 - 69.6 %   MCV 97.0 80.0 - 100.0 fL   MCH 30.2 26.0 - 34.0 pg   MCHC 31.1 30.0 - 36.0 g/dL   RDW 29.5 28.4 - 13.2 %   Platelets 162 150 - 400 K/uL   nRBC 0.0 0.0 - 0.2 %    Comment: Performed at [facility], [address] [place], [place] [address]  Renal function panel     Status: Abnormal   Collection Time: 06/28/22  1:30 AM  Result Value Ref Range   Sodium 133 (L) 135 - 145 mmol/L   Potassium 3.4 (L) 3.5 - 5.1 mmol/L   Chloride 95 (L) 98 - 111 mmol/L   CO2 27 22 - 32 mmol/L   Glucose, Bld 91 70 - 99 mg/dL    Comment: Glucose reference range applies only to samples taken after fasting for at least 8 hours.   BUN 20 8 - 23 mg/dL   Creatinine, Ser 6.04 (H) 0.61 - 1.24 mg/dL    Calcium 8.7 (L) 8.9 - 10.3 mg/dL   Phosphorus 3.5 2.5 - 4.6 mg/dL   Albumin 2.3 (L) 3.5 - 5.0 g/dL   GFR, Estimated 19 (L) >60 mL/min    Comment: (NOTE) Calculated using the CKD-EPI Creatinine Equation (2021)    Anion gap 11 5 - 15    Comment: Performed at [facility], [address], [place], [place] [address]  Hemoglobin and hematocrit, blood     Status: Abnormal   Collection Time: 06/29/22  1:23 AM  Result Value Ref Range   Hemoglobin 8.5 (L) 13.0 - 17.0 g/dL   HCT 11.9 (L) 14.7 - 82.9 %    Comment: Performed at [facility], [address], [place], [place] [address]  Renal function panel     Status: Abnormal   Collection Time: 06/29/22  1:23 AM  Result Value Ref Range   Sodium 133 (L) 135 - 145 mmol/L   Potassium 3.4 (L) 3.5 - 5.1 mmol/L   Chloride 96 (L) 98 - 111 mmol/L   CO2 29 22 - 32 mmol/L   Glucose, Bld 110 (H) 70 - 99 mg/dL    Comment: Glucose reference range applies only to samples taken after fasting for at least 8 hours.   BUN 11 8 - 23 mg/dL   Creatinine, Ser 0.86 (H) 0.61 - 1.24 mg/dL   Calcium 8.7 (L) 8.9 - 10.3 mg/dL   Phosphorus 2.5 2.5 - 4.6 mg/dL   Albumin 2.4 (L) 3.5 - 5.0 g/dL   GFR, Estimated 28 (L) >60 mL/min    Comment: (NOTE) Calculated using the CKD-EPI Creatinine Equation (2021)    Anion gap 8 5 - 15    Comment: Performed at [facility], [address], [place], [place] [address]  Magnesium     Status: Abnormal   Collection Time: 06/29/22  1:23 AM  Result Value Ref Range   Magnesium 1.4 (L) 1.7 - 2.4 mg/dL    Comment: Performed at [facility], [address] [place], [place] [address]  CBC     Status: Abnormal   Collection Time: 06/30/22  1:15 AM  Result Value Ref Range   WBC 7.6 4.0 - 10.5 K/uL   RBC 2.91 (L) 4.22 - 5.81 MIL/uL   Hemoglobin 8.8 (L) 13.0 - 17.0 g/dL   HCT 28.4 (L) 13.2 - 44.0 %   MCV 98.6 80.0 - 100.0 fL   MCH 30.2 26.0 - 34.0 pg   MCHC 30.7 30.0 - 36.0 g/dL   RDW 10.2 72.5 - 36.6 %    Platelets 183 150 - 400 K/uL   nRBC 0.0 0.0 - 0.2 %    Comment: Performed at [facility], [address] [place], [place] [address]  Basic metabolic panel     Status: Abnormal  Collection Time: 06/30/22  1:15 AM  Result Value Ref Range   Sodium 132 (L) 135 - 145 mmol/L   Potassium 4.1 3.5 - 5.1 mmol/L   Chloride 95 (L) 98 - 111 mmol/L   CO2 27 22 - 32 mmol/L   Glucose, Bld 97 70 - 99 mg/dL    Comment: Glucose reference range applies only to samples taken after fasting for at least 8 hours.   BUN 20 8 - 23 mg/dL   Creatinine, Ser 6.44 (H) 0.61 - 1.24 mg/dL    Comment: DELTA CHECK NOTED   Calcium 9.1 8.9 - 10.3 mg/dL   GFR, Estimated 16 (L) >60 mL/min    Comment: (NOTE) Calculated using the CKD-EPI Creatinine Equation (2021)    Anion gap 10 5 - 15    Comment: Performed at [facility], [address] [place], [place] [address]  Magnesium     Status: Abnormal   Collection Time: 06/30/22  1:15 AM  Result Value Ref Range   Magnesium 1.4 (L) 1.7 - 2.4 mg/dL    Comment: Performed at [facility], [address] [place], [place] [address]  Glucose, capillary     Status: Abnormal   Collection Time: 06/30/22  4:30 PM  Result Value Ref Range   Glucose-Capillary 67 (L) 70 - 99 mg/dL    Comment: Glucose reference range applies only to samples taken after fasting for at least 8 hours.  Glucose, capillary     Status: Abnormal   Collection Time: 06/30/22  4:49 PM  Result Value Ref Range   Glucose-Capillary 129 (H) 70 - 99 mg/dL    Comment: Glucose reference range applies only to samples taken after fasting for at least 8 hours.  Glucose, capillary     Status: None   Collection Time: 06/30/22  7:14 PM  Result Value Ref Range   Glucose-Capillary 89 70 - 99 mg/dL    Comment: Glucose reference range applies only to samples taken after fasting for at least 8 hours.  Glucose, capillary     Status: None   Collection Time: 06/30/22  8:19 PM  Result Value Ref Range    Glucose-Capillary 90 70 - 99 mg/dL    Comment: Glucose reference range applies only to samples taken after fasting for at least 8 hours.  CBC     Status: Abnormal   Collection Time: 07/01/22  9:01 AM  Result Value Ref Range   WBC 10.5 4.0 - 10.5 K/uL   RBC 2.38 (L) 4.22 - 5.81 MIL/uL   Hemoglobin 7.4 (L) 13.0 - 17.0 g/dL   HCT 38.7 (L) 56.4 - 33.2 %   MCV 101.7 (H) 80.0 - 100.0 fL   MCH 31.1 26.0 - 34.0 pg   MCHC 30.6 30.0 - 36.0 g/dL   RDW 95.1 88.4 - 16.6 %   Platelets 209 150 - 400 K/uL   nRBC 0.0 0.0 - 0.2 %    Comment: Performed at [facility], [address] [place], [place] [address]  Magnesium     Status: Abnormal   Collection Time: 07/01/22  9:01 AM  Result Value Ref Range   Magnesium 1.3 (L) 1.7 - 2.4 mg/dL    Comment: Performed at [facility], [address] [place], [place] [address]  Renal function panel     Status: Abnormal   Collection Time: 07/01/22  9:01 AM  Result Value Ref Range   Sodium 131 (L) 135 - 145 mmol/L  Potassium 5.0 3.5 - 5.1 mmol/L   Chloride 96 (L) 98 - 111 mmol/L   CO2 24 22 - 32 mmol/L   Glucose, Bld 147 (H) 70 - 99 mg/dL    Comment: Glucose reference range applies only to samples taken after fasting for at least 8 hours.   BUN 31 (H) 8 - 23 mg/dL   Creatinine, Ser 1.61 (H) 0.61 - 1.24 mg/dL   Calcium 8.2 (L) 8.9 - 10.3 mg/dL   Phosphorus 6.1 (H) 2.5 - 4.6 mg/dL   Albumin 2.2 (L) 3.5 - 5.0 g/dL   GFR, Estimated 12 (L) >60 mL/min    Comment: (NOTE) Calculated using the CKD-EPI Creatinine Equation (2021)    Anion gap 11 5 - 15    Comment: Performed at [facility], [address] [place], [place] [address]  Prepare RBC (crossmatch)     Status: None   Collection Time: 07/01/22  9:53 AM  Result Value Ref Range   Order Confirmation      ORDER PROCESSED BY BLOOD BANK Performed at [facility], [address], [place], [place] [address]   Type and screen [facility]     Status: None   Collection  Time: 07/01/22  9:59 AM  Result Value Ref Range   ABO/RH(D) O POS    Antibody Screen NEG    Sample Expiration 07/04/2022,2359    Unit Number [id number]    Blood Component Type RED CELLS,LR    Unit division 00    Status of Unit ISSUED,FINAL    Transfusion Status OK TO TRANSFUSE    Crossmatch Result      Compatible Performed at [facility], [address], [place], [place] [address]   BPAM RBC     Status: None   Collection Time: 07/01/22  9:59 AM  Result Value Ref Range   ISSUE DATE / TIME [id number]    Blood Product Unit Number [id number]    PRODUCT CODE [id number]    Unit Type and Rh 5100    Blood Product Expiration Date [id number]   Type and screen [facility]     Status: None   Collection Time: 07/15/22  6:47 PM  Result Value Ref Range   ABO/RH(D) O POS    Antibody Screen NEG    Sample Expiration      07/18/2022,2359 Performed at [facility], [address], [place], [place] [address]   Culture, blood (routine x 2)     Status: None (Preliminary result)   Collection Time: 07/15/22  6:47 PM   Specimen: BLOOD RIGHT HAND  Result Value Ref Range   Specimen Description BLOOD RIGHT HAND    Special Requests      BOTTLES DRAWN AEROBIC ONLY Blood Culture results may not be optimal due to an inadequate volume of blood received in culture bottles   Culture      NO GROWTH 2 DAYS Performed at [facility], [address], [place], [place] [address]    Report Status PENDING   Culture, blood (routine x 2)     Status: None (Preliminary result)   Collection Time: 07/15/22  6:47 PM   Specimen: BLOOD RIGHT HAND  Result Value Ref Range   Specimen Description BLOOD RIGHT HAND    Special Requests      BOTTLES DRAWN AEROBIC ONLY Blood Culture adequate volume   Culture      NO GROWTH 2 DAYS Performed at [facility], [address] [place], [place] [address]    Report Status PENDING   CBC with Differential     Status: Abnormal    Collection Time: 07/15/22  6:51 PM  Result Value Ref Range   WBC 7.9 4.0 - 10.5 K/uL   RBC 3.08 (L) 4.22 - 5.81 MIL/uL   Hemoglobin 9.5 (L) 13.0 - 17.0 g/dL   HCT 82.9 (L) 56.2 - 13.0 %   MCV 99.4 80.0 - 100.0 fL   MCH 30.8 26.0 - 34.0 pg   MCHC 31.0 30.0 - 36.0 g/dL   RDW 86.5 78.4 - 69.6 %   Platelets 185 150 - 400 K/uL   nRBC 0.0 0.0 - 0.2 %   Neutrophils Relative % 72 %   Neutro Abs 5.7 1.7 - 7.7 K/uL   Lymphocytes Relative 14 %   Lymphs Abs 1.1 0.7 - 4.0 K/uL   Monocytes Relative 9 %   Monocytes Absolute 0.7 0.1 - 1.0 K/uL   Eosinophils Relative 3 %   Eosinophils Absolute 0.3 0.0 - 0.5 K/uL   Basophils Relative 1 %   Basophils Absolute 0.1 0.0 - 0.1 K/uL   Immature Granulocytes 1 %   Abs Immature Granulocytes 0.04 0.00 - 0.07 K/uL    Comment: Performed at [facility], [address], [place], [place] [address]  Troponin I (High Sensitivity)     Status: Abnormal   Collection Time: 07/15/22  6:51 PM  Result Value Ref Range   Troponin I (High Sensitivity) 34 (H) <18 ng/L    Comment: (NOTE) Elevated high sensitivity troponin I (hsTnI) values and significant  changes across serial measurements may suggest ACS but many other  chronic and acute conditions are known to elevate hsTnI results.  Refer to the "Links" section for chest pain algorithms and additional  guidance. Performed at [facility], [address], [place], [place] [address]   Comprehensive metabolic panel     Status: Abnormal   Collection Time: 07/15/22  6:51 PM  Result Value Ref Range   Sodium 139 135 - 145 mmol/L   Potassium 4.8 3.5 - 5.1 mmol/L    Comment: HEMOLYSIS AT THIS LEVEL MAY AFFECT RESULT   Chloride 100 98 - 111 mmol/L   CO2 25 22 - 32 mmol/L   Glucose, Bld 100 (H) 70 - 99 mg/dL    Comment: Glucose reference range applies only to samples taken after fasting for at least 8 hours.   BUN 16 8 - 23 mg/dL   Creatinine, Ser 4.01 (H) 0.61 - 1.24 mg/dL   Calcium 8.7 (L) 8.9 - 10.3 mg/dL    Total Protein 6.3 (L) 6.5 - 8.1 g/dL   Albumin 2.5 (L) 3.5 - 5.0 g/dL   AST 23 15 - 41 U/L   ALT 6 0 - 44 U/L   Alkaline Phosphatase 112 38 - 126 U/L   Total Bilirubin 1.1 0.3 - 1.2 mg/dL   GFR, Estimated 22 (L) >60 mL/min    Comment: (NOTE) Calculated using the CKD-EPI Creatinine Equation (2021)    Anion gap 14 5 - 15    Comment: Performed at [facility], [address], [place], [place] [address]  CK     Status: None   Collection Time: 07/15/22  6:51 PM  Result Value Ref Range   Total CK 151 49 - 397 U/L    Comment: Performed at [facility], [address] [place], [place] [address]  Protime-INR     Status: Abnormal   Collection Time: 07/15/22  6:51 PM  Result Value Ref Range   Prothrombin Time 15.6 (H) 11.4 - 15.2 seconds   INR 1.2 0.8 - 1.2    Comment: (NOTE) INR goal varies based on device and disease states. Performed at [facility], [address] [place], [place] [address]   Lactic acid, plasma     Status: None   Collection Time: 07/15/22  6:51 PM  Result Value Ref Range   Lactic Acid, Venous 1.2 0.5 - 1.9 mmol/L    Comment: Performed at [facility], [address], [place], [place] [address]  I-stat chem 8, ED     Status: Abnormal   Collection Time: 07/15/22  7:07 PM  Result Value Ref Range   Sodium 137 135 - 145 mmol/L   Potassium 4.7 3.5 - 5.1 mmol/L   Chloride 100 98 - 111 mmol/L   BUN 20 8 - 23 mg/dL   Creatinine, Ser 0.27 (H) 0.61 - 1.24 mg/dL   Glucose, Bld 97 70 - 99 mg/dL    Comment: Glucose reference range applies only to samples taken after fasting for at least 8 hours.   Calcium, Ion 1.07 (L) 1.15 - 1.40 mmol/L   TCO2 29 22 - 32 mmol/L   Hemoglobin 10.2 (L) 13.0 - 17.0 g/dL   HCT 25.3 (L) 66.4 - 40.3 %  I-Stat venous blood gas, ED     Status: Abnormal   Collection Time: 07/15/22  7:08 PM  Result Value Ref Range   pH, Ven 7.463 (H) 7.25 - 7.43   pCO2, Ven 43.0 (L) 44 - 60 mmHg   pO2, Ven 48 (H) 32 - 45 mmHg    Bicarbonate 30.8 (H) 20.0 - 28.0 mmol/L   TCO2 32 22 - 32 mmol/L   O2 Saturation 85 %   Acid-Base Excess 6.0 (H) 0.0 - 2.0 mmol/L   Sodium 137 135 - 145 mmol/L   Potassium 4.8 3.5 - 5.1 mmol/L   Calcium, Ion 1.09 (L) 1.15 - 1.40 mmol/L   HCT 31.0 (L) 39.0 - 52.0 %   Hemoglobin 10.5 (L) 13.0 - 17.0 g/dL   Sample type VENOUS   Troponin I (High Sensitivity)     Status: Abnormal   Collection Time: 07/15/22 10:10 PM  Result Value Ref Range   Troponin I (High Sensitivity) 40 (H) <18 ng/L    Comment: (NOTE) Elevated high sensitivity troponin I (hsTnI) values and significant  changes across serial measurements may suggest ACS but many other  chronic and acute conditions are known to elevate hsTnI results.  Refer to the "Links" section for chest pain algorithms and additional  guidance. Performed at [facility], [address], [place], [place] [address]   Ammonia     Status: None   Collection Time: 07/16/22  1:00 AM  Result Value Ref Range   Ammonia 19 9 - 35 umol/L    Comment: Performed at [facility], [address], [place], [place] [address]  CBC     Status: Abnormal   Collection Time: 07/16/22  2:55 AM  Result Value Ref Range   WBC 7.6 4.0 - 10.5 K/uL   RBC 3.08 (L) 4.22 - 5.81 MIL/uL   Hemoglobin 9.1 (L) 13.0 - 17.0 g/dL   HCT 75.6 (L) 43.3 - 29.5 %   MCV 98.7 80.0 - 100.0 fL   MCH 29.5 26.0 - 34.0 pg  MCHC 29.9 (L) 30.0 - 36.0 g/dL   RDW 40.9 81.1 - 91.4 %   Platelets 177 150 - 400 K/uL   nRBC 0.0 0.0 - 0.2 %    Comment: Performed at [facility], [address], [place], [place] [address]  Basic metabolic panel     Status: Abnormal   Collection Time: 07/16/22  2:55 AM  Result Value Ref Range   Sodium 139 135 - 145 mmol/L   Potassium 3.8 3.5 - 5.1 mmol/L   Chloride 99 98 - 111 mmol/L   CO2 25 22 - 32 mmol/L   Glucose, Bld 80 70 - 99 mg/dL    Comment: Glucose reference range applies only to samples taken after fasting for at least 8 hours.   BUN  18 8 - 23 mg/dL   Creatinine, Ser 6.21 (H) 0.61 - 1.24 mg/dL   Calcium 8.9 8.9 - 30.8 mg/dL   GFR, Estimated 19 (L) >60 mL/min    Comment: (NOTE) Calculated using the CKD-EPI Creatinine Equation (2021)    Anion gap 15 5 - 15    Comment: Performed at [facility], [address] [place], [place] [address]  Troponin I (High Sensitivity)     Status: Abnormal   Collection Time: 07/16/22  2:55 AM  Result Value Ref Range   Troponin I (High Sensitivity) 40 (H) <18 ng/L    Comment: (NOTE) Elevated high sensitivity troponin I (hsTnI) values and significant  changes across serial measurements may suggest ACS but many other  chronic and acute conditions are known to elevate hsTnI results.  Refer to the "Links" section for chest pain algorithms and additional  guidance. Performed at [facility], [address], [place], [place] [address]   Hemoglobin A1c     Status: Abnormal   Collection Time: 07/16/22  2:55 AM  Result Value Ref Range   Hgb A1c MFr Bld 4.4 (L) 4.8 - 5.6 %    Comment: (NOTE) Pre diabetes:          5.7%-6.4%  Diabetes:              >6.4%  Glycemic control for   <7.0% adults with diabetes    Mean Plasma Glucose 79.58 mg/dL    Comment: Performed at [facility], [address], [place], [place] [address]  Hepatitis B surface antigen     Status: None   Collection Time: 07/16/22  3:26 PM  Result Value Ref Range   Hepatitis B Surface Ag NON REACTIVE NON REACTIVE    Comment: Performed at [facility], [address] [place], [place] [address]  Hepatitis B surface antibody,quantitative     Status: Abnormal   Collection Time: 07/16/22  3:26 PM  Result Value Ref Range   Hep B S AB Quant (Post) 8.9 (L) Immunity>10 mIU/mL    Comment: (NOTE)  Status of Immunity                     Anti-HBs Level  ------------------                     -------------- Inconsistent with Immunity                  0.0 - 10.0 Consistent with Immunity                          >10.0 Performed At: BN  [facility] [address] [place], [place] [id number] Jolene Schimke MD WU:[id number]   CBC with Differential/Platelet     Status: Abnormal   Collection Time: 07/17/22  2:04 PM  Result Value Ref Range   WBC 13.5 (H) 4.0 - 10.5 K/uL   RBC 3.52 (L) 4.22 - 5.81 MIL/uL   Hemoglobin 10.6 (L) 13.0 - 17.0 g/dL   HCT 95.6 (L) 21.3 - 08.6 %   MCV 98.6 80.0 - 100.0 fL   MCH 30.1 26.0 - 34.0 pg   MCHC 30.5 30.0 - 36.0 g/dL   RDW 57.8 46.9 - 62.9 %   Platelets 189 150 - 400 K/uL   nRBC 0.0 0.0 - 0.2 %   Neutrophils Relative % 85 %   Neutro Abs 11.3 (H) 1.7 - 7.7 K/uL   Lymphocytes Relative 9 %   Lymphs Abs 1.2 0.7 - 4.0 K/uL   Monocytes Relative 5 %   Monocytes Absolute 0.7 0.1 - 1.0 K/uL   Eosinophils Relative 1 %   Eosinophils Absolute 0.2 0.0 - 0.5 K/uL   Basophils Relative 0 %   Basophils Absolute 0.1 0.0 - 0.1 K/uL   Immature Granulocytes 0 %   Abs Immature Granulocytes 0.06 0.00 - 0.07 K/uL    Comment: Performed at [facility], [address], [place], [place] [address]  I-STAT, [place] 8     Status: Abnormal   Collection Time: 07/17/22  3:35 PM  Result Value Ref Range   Sodium 135 135 - 145 mmol/L   Potassium 4.3 3.5 - 5.1 mmol/L   Chloride 99 98 - 111 mmol/L   BUN 14 8 - 23 mg/dL   Creatinine, Ser 3.24 (H) 0.61 - 1.24 mg/dL   Glucose, Bld 78 70 - 99 mg/dL    Comment: Glucose reference range applies only to samples taken after fasting for at least 8 hours.   Calcium, Ion 1.01 (L) 1.15 - 1.40 mmol/L   TCO2 29 22 - 32 mmol/L   Hemoglobin 11.9 (L) 13.0 - 17.0 g/dL   HCT 40.1 (L) 02.7 - 25.3 %     Complete History and Physical exam available in the office notes  Gomez Cleverly

## 2022-07-17 NOTE — Progress Notes (Signed)
PROGRESS NOTE    Chase [name]  WUJ:[id number] DOB: 23-Jun-1950 DOA: 07/15/2022 PCP: [name], MD   Brief Narrative:  72 year old gentleman with a history of ESRD on HD, TTS schedule, CAD and hypothyroidism and hypertension and multiple other comorbidities who was admitted due to acute encephalopathy as well as right ring finger dry gangrene.  Started on antibiotics.  Hand surgery on board.  Nephrology on board as well.  Details below.    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  Radial pulse currently palpable.  exam concerning for erythema spreading proximally from the gangrenous finger.  No fever, leukocytosis, or lactic acidosis.  X-ray negative for soft tissue gas or findings of osteomyelitis.  All cultures negative.  He is a scheduled to have amputation of the finger today by Dr. Frazier Butt continue vancomycin and Zosyn.    Acute metabolic encephalopathy Likely due to infection.  CT head negative for acute intracranial abnormality and no focal neurodeficit on exam.  Ammonia level normal.  Patient was fully alert and oriented to person, month and the year but not to place.   Abdominal hemoperitoneum/abdominal wall hematoma at the site of recent PD catheter removal Incidentally noted on CT.  Patient is hemodynamically stable.  Abdominal exam benign and hemoglobin dropped slightly yesterday, will repeat CBC today.  ED physician discussed the case with Dr. Janee Morn with general surgery and Dr. Randie Heinz with vascular surgery who felt that there no clear indication for surgical intervention at this time given no surgical abdomen  on exam.  Will continue to hold aspirin and Plavix.     Possible cystitis Patient has complained of dysuria and CT showing likely cystitis.  Continue antibiotics, UA ordered and still pending.   Elevated troponin EKG showing borderline T wave abnormalities.  Troponin 34> 40.  ACS less likely as patient is not having any chest pain.     Small loculated left posterior basal pleural effusion with adjacent atelectasis or consolidation Seen on CT and appears unchanged.  Patient is not endorsing any respiratory symptoms.  Not hypoxic.   8 mm right lower lobe pulmonary nodule Seen on CT and appears stable compared to previous scan from January 2023.  Patient will need outpatient follow-up.   ESRD on HD TTS Nephrology on board and he is getting dialysis now.   CAD PVD Hold aspirin and Plavix given abdominal hemoperitoneum/abdominal wall hematoma and plan for surgery today.  Continue Crestor.  Will resume postoperatively if hemoglobin continues to remain stable by tomorrow.   Hypothyroidism Continue Synthroid   Depression/anxiety Continue BuSpar and Lexapro   GERD Continue Protonix.   Type 2 diabetes ruled out Not on medications.  A1c 5.5 in October 2022, no previous values for comparison.  Repeat A1c 4.4   Chronic back and neck pain Continue buprenorphine and Oxy IR PRN  DVT prophylaxis: SCDs Start: 07/16/22 0240   Code Status: Full Code  Family Communication:  None present at bedside.   Status is: Inpatient Remains inpatient appropriate because: Scheduled for surgery today   Estimated body mass index is 30.57 kg/m as calculated from the following:   Height as of this encounter:  5\' 6"  (1.676 m).   Weight as of this encounter: 85.9 kg.    Nutritional Assessment: Body mass index is 30.57 kg/m.Marland Kitchen Seen by dietician.  I agree with the assessment and plan as outlined below: Nutrition Status:        . Skin Assessment: I have examined the patient's skin and I agree with  the wound assessment as performed by the wound care RN as outlined below:    Consultants:  Hand surgery and nephrology  Procedures:  As above  Antimicrobials:  Anti-infectives (From admission, onward)    Start     Dose/Rate Route Frequency Ordered Stop   07/17/22 1800  vancomycin (VANCOCIN) IVPB 1000 mg/200 mL premix        1,000 mg 200 mL/hr over 60 Minutes Intravenous Every T-Th-Sa (Hemodialysis) 07/16/22 0952     07/17/22 1200  vancomycin (VANCOCIN) IVPB 1000 mg/200 mL premix  Status:  Discontinued        1,000 mg 200 mL/hr over 60 Minutes Intravenous Every T-Th-Sa (Hemodialysis) 07/16/22 0329 07/16/22 0952   07/17/22 0600  ceFAZolin (ANCEF) IVPB 2g/100 mL premix        2 g 200 mL/hr over 30 Minutes Intravenous On call to O.R. 07/16/22 2042 07/18/22 0559   07/16/22 0800  piperacillin-tazobactam (ZOSYN) IVPB 2.25 g        2.25 g 100 mL/hr over 30 Minutes Intravenous Every 8 hours 07/16/22 0329     07/15/22 2245  piperacillin-tazobactam (ZOSYN) IVPB 2.25 g        2.25 g 100 mL/hr over 30 Minutes Intravenous  Once 07/15/22 2238 07/16/22 0148   07/15/22 2215  vancomycin (VANCOREADY) IVPB 2000 mg/400 mL        2,000 mg 200 mL/hr over 120 Minutes Intravenous  Once 07/15/22 2211 07/16/22 0148         Subjective: Patient seen and examined in dialysis unit.  He was complaining of mild nausea, Zofran is ordered for him.  He had no other complaint.  He was fully alert and oriented to person as well as month and the year.  Objective: Vitals:   07/17/22 0740 07/17/22 0830 07/17/22 0921 07/17/22 0933  BP: 118/84 125/66 (!) 130/56 135/63  Pulse: 62 63 64 66  Resp: (!) 21 19 19  (!) 26  Temp: (!) 97 F (36.1 C)     TempSrc:      SpO2: 97% 92% 93% 96%  Weight: 85.9 kg     Height:       No intake or output data in the 24 hours ending 07/17/22 0952 Filed Weights   07/16/22 0429 07/16/22 0443 07/17/22 0740  Weight: 87.6 kg 87.6 kg 85.9 kg    Examination:  General exam:  Appears calm and comfortable  Respiratory system: Clear to auscultation. Respiratory effort normal. Cardiovascular system: S1 & S2 heard, RRR. No JVD, murmurs, rubs, gallops or clicks. No pedal edema. Gastrointestinal system: Abdomen is nondistended, soft and nontender. No organomegaly or masses felt. Normal bowel sounds heard. Central nervous system: Alert and oriented x 2. No focal neurological deficits. Extremities: Gangrene of the right ring finger with mild erythema proximal to that.   Data Reviewed: I have personally reviewed following labs and imaging studies  CBC: Recent Labs  Lab 07/15/22 1851 07/15/22 1907 07/15/22 1908 07/16/22 0255  WBC 7.9  --   --  7.6  NEUTROABS 5.7  --   --   --   HGB 9.5* 10.2* 10.5* 9.1*  HCT 30.6* 30.0* 31.0*  30.4*  MCV 99.4  --   --  98.7  PLT 185  --   --  177   Basic Metabolic Panel: Recent Labs  Lab 07/15/22 1851 07/15/22 1907 07/15/22 1908 07/16/22 0255  NA 139 137 137 139  K 4.8 4.7 4.8 3.8  CL 100 100  --  99  CO2 25  --   --  25  GLUCOSE 100* 97  --  80  BUN 16 20  --  18  CREATININE 2.91* 3.20*  --  3.31*  CALCIUM 8.7*  --   --  8.9   GFR: Estimated Creatinine Clearance: 21.3 mL/min (A) (by C-G formula based on SCr of 3.31 mg/dL (H)). Liver Function Tests: Recent Labs  Lab 07/15/22 1851  AST 23  ALT 6  ALKPHOS 112  BILITOT 1.1  PROT 6.3*  ALBUMIN 2.5*   No results for input(s): "LIPASE", "AMYLASE" in the last 168 hours. Recent Labs  Lab 07/16/22 0100  AMMONIA 19   Coagulation Profile: Recent Labs  Lab 07/15/22 1851  INR 1.2   Cardiac Enzymes: Recent Labs  Lab 07/15/22 1851  CKTOTAL 151   BNP (last 3 results) No results for input(s): "PROBNP" in the last 8760 hours. HbA1C: Recent Labs    07/16/22 0255  HGBA1C 4.4*   CBG: No results for input(s): "GLUCAP" in the last 168 hours. Lipid Profile: No results for input(s): "CHOL", "HDL", "LDLCALC", "TRIG", "CHOLHDL", "LDLDIRECT" in the last 72  hours. Thyroid Function Tests: No results for input(s): "TSH", "T4TOTAL", "FREET4", "T3FREE", "THYROIDAB" in the last 72 hours. Anemia Panel: No results for input(s): "VITAMINB12", "FOLATE", "FERRITIN", "TIBC", "IRON", "RETICCTPCT" in the last 72 hours. Sepsis Labs: Recent Labs  Lab 07/15/22 1851  LATICACIDVEN 1.2    Recent Results (from the past 240 hour(s))  Culture, blood (routine x 2)     Status: None (Preliminary result)   Collection Time: 07/15/22  6:47 PM   Specimen: BLOOD RIGHT HAND  Result Value Ref Range Status   Specimen Description BLOOD RIGHT HAND  Final   Special Requests   Final    BOTTLES DRAWN AEROBIC ONLY Blood Culture results may not be optimal due to an inadequate volume of blood received in culture bottles   Culture   Final    NO GROWTH 2 DAYS Performed at [facility], [address] [place], [place] [address]    Report Status PENDING  Incomplete  Culture, blood (routine x 2)     Status: None (Preliminary result)   Collection Time: 07/15/22  6:47 PM   Specimen: BLOOD RIGHT HAND  Result Value Ref Range Status   Specimen Description BLOOD RIGHT HAND  Final   Special Requests   Final    BOTTLES DRAWN AEROBIC ONLY Blood Culture adequate volume   Culture   Final    NO GROWTH 2 DAYS Performed at [facility], [address] [place], [place] [address]    Report Status PENDING  Incomplete     Radiology Studies: CT CHEST ABDOMEN PELVIS WO CONTRAST  Result Date: 07/15/2022 CLINICAL DATA:  Nonlocalized abdominal pain. History of GIST of the stomach, left nephrectomy, cholecystectomy. Recent removal of peritoneal dialysis catheter 06/30/2022. EXAM: CT CHEST, ABDOMEN AND PELVIS WITHOUT CONTRAST TECHNIQUE: Multidetector CT imaging of the chest, abdomen and pelvis was performed following the standard protocol without IV contrast. RADIATION DOSE REDUCTION: This exam was performed according to the departmental dose-optimization program which includes  automated exposure control, adjustment of the  mA and/or kV according to patient size and/or use of iterative reconstruction technique. COMPARISON:  Portable chest today, portable chest 05/07/2022, chest, abdomen and pelvis CTs with contrast 04/11/2022 and 10/21/2021. FINDINGS: CT CHEST FINDINGS Cardiovascular: Stable cardiomegaly with patchy three-vessel calcific CAD. No substantial pericardial effusion. Chronically prominent pulmonary trunk measuring 2.9 cm is unchanged. There are heavy calcifications and mild tortuosity of the thoracic aorta, patchy calcific plaques in the great vessels. There is no aortic aneurysm. There are calcifications along the aortic valve leaflets as well. Pulmonary veins are normal caliber. Mediastinum/Nodes: There are multiple borderline prominent anterior and middle mediastinal lymph nodes, fever 2 slightly prominent, largest is a subcarinal lymph node measuring 1 cm in short axis. There is no new or enlarging adenopathy. The thyroid gland, axillary spaces are unremarkable. The thoracic esophagus, thoracic trachea, and main bronchi are unremarkable. Lungs/Pleura: There is a small left posterior basal pleural effusion, which now appears loculated. Left lower lobe base adjacent atelectasis or consolidation which is unchanged in appearance. The lungs exhibit scattered linear scar-like opacities, likely postsurgical scarring in the right upper lobe, with a chronically elevated right diaphragm. There is a stable 8 mm noncalcified right lower lobe nodule on 5:85. Stable back to 01/23/2021. Left lower lobe volume loss again is suggested adjacent the pleural effusion. Central airways are patent. No new abnormality in the lung fields. Musculoskeletal: Extensive thoracic spine bridging enthesopathy no acute or other significant osseous findings. Spinal cord stimulator wiring again terminates at T6 and separately at T11. There is bridging enthesopathy along lower thoracic spinous processes. Mild  gynecomastia. CT ABDOMEN PELVIS FINDINGS Hepatobiliary: Subcentimeter nonspecific hypodensity is again noted inferiorly in segment 6. No other focal liver abnormality is seen without contrast. There is increased intrahepatic and extrahepatic biliary dilatation post cholecystectomy, with common bile duct now 2 cm. The cystic duct remnant and the common bile duct are both packed with numerous subcentimeter rim calcified stones. I believe these were present previously at least on the last CT, but there was pneumobilia on that study confusing the picture. Pancreas: No focal abnormality or ductal dilatation is seen without contrast. Unchanged periampullary duodenal diverticulum. Spleen: There are calcified hilar arterial plaques. No splenomegaly. No other focal abnormality. Adrenals/Urinary Tract: Left nephrectomy. 1.1 cm hyperdense cyst upper pole right kidney. There are scattered collecting system stones, medullary nephrocalcinosis and a 1.2 cm right renal pelvis/UPJ stone without hydronephrosis which I believe was also present on the last CT. Collecting system stones measure up to 2 cm. There are additional too small to characterize renal hypodensities in a few small cysts. There is no adrenal mass. No suspicious renal lesion without contrast. No right ureteral stone. There is mild bladder thickening and perivesical haziness consistent with cystitis. Stomach/Bowel: Somewhat elongated stomach is unchanged in appearance with mild thickened folds proximally. There is no small bowel obstruction or inflammation. There is advanced colonic diverticulosis. Chronic thickening in the wall of the sigmoid colon is unchanged, without overt inflammatory reaction. Increased rectal fecal retention is seen without findings of stercoral proctitis. Vascular/Lymphatic: There is heavy aortoiliac and visceral arterial calcific plaque no AAA. No adenopathy is seen. Reproductive: Prostate is normal in size. Other: There is hemoperitoneum  with irregular Hematoma measuring 37-45 Hounsfield units collecting adjacent the left rectus sheath in the mid to lower abdomen measuring as much as 12 cm coronal, 3.4 cm AP and extending for a craniocaudal length up to 10 cm. There is a small volume of free hemorrhagic content in the distal paracolic gutters  and pelvis. The hemorrhagic source is most likely from the left rectus muscle/sheath as this is where the peritoneal dialysis catheter entered. There is mild body wall anasarca which was not seen previously. There is no free air or abscess. Mild mesenteric congestive features appear similar. Musculoskeletal: There is osteopenia with degenerative changes of the lumbar spine and chronic compression fracture of L2, moderate hip DJD. No destructive osseous or lytic lesions. In addition to bridging enthesopathy there are bridging syndesmophytes primarily of the thoracic spine. IMPRESSION: 1. Left mid to lower abdominal hemoperitoneum, most likely from the left rectus sheath/sheath as this is where the peritoneal dialysis catheter entered and was recently removed. The largest amount is in a hematoma along the inner surface of the left abdominal wall. 2. Small volume of free hemorrhagic content in the distal paracolic gutters and pelvis. 3. Likely cystitis. 4. Small loculated left posterior basal pleural effusion with adjacent atelectasis or consolidation, unchanged. 5. Stable 8 mm right lower lobe nodule back to 01/23/2021. Attention on follow-up scans recommended. 6. Cardiomegaly with aortic and coronary artery atherosclerosis. Chronic prominence of the pulmonary trunk. 7. Stable borderline prominent mediastinal lymph nodes. 8. Increased intrahepatic and extrahepatic biliary dilatation post cholecystectomy, with cystic duct remnant and common bile duct both packed with numerous subcentimeter rim calcified stones. 9. Bilateral nephrolithiasis and medullary nephrocalcinosis with 1.2 cm right renal pelvis/UPJ stone  without hydronephrosis. 10. Chronic thickening in the sigmoid colon without overt inflammatory reaction. Diffuse diverticulosis. 11. Increased rectal fecal retention. No findings of stercoral proctitis. 12. Osteopenia and degenerative/DISH change. Chronic L2 compression fracture. 13. Critical Value/emergent results were called by telephone at the time of interpretation on 07/15/2022 at 11:27 pm to provider [place] , who verbally acknowledged these results. Electronically Signed   By: Almira Bar M.D.   On: 07/15/2022 23:37   CT Head Wo Contrast  Result Date: 07/15/2022 CLINICAL DATA:  Mental status change EXAM: CT HEAD WITHOUT CONTRAST TECHNIQUE: Contiguous axial images were obtained from the base of the skull through the vertex without intravenous contrast. RADIATION DOSE REDUCTION: This exam was performed according to the departmental dose-optimization program which includes automated exposure control, adjustment of the mA and/or kV according to patient size and/or use of iterative reconstruction technique. COMPARISON:  CT 12/15/2021 FINDINGS: Brain: No evidence of acute infarction, hemorrhage, hydrocephalus, extra-axial collection or mass lesion/mass effect. There is moderate diffuse atrophy. There is moderate patchy periventricular and deep white matter hypodensity, likely chronic small vessel ischemic change. This is similar to prior. 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 Vascular: There are atherosclerotic calcifications of the intracranial internal carotid arteries, vertebral arteries and peripheral vasculature. Skull: Normal. Negative for fracture or focal lesion. Sinuses/Orbits: No acute finding. Other: None. IMPRESSION: 1. No acute intracranial process. 2. Moderate chronic small vessel ischemic change and atrophy. Electronically Signed   By: Darliss Cheney M.D.   On: 07/15/2022 22:52   DG Hand Complete Right  Result Date: 07/15/2022 CLINICAL DATA:  Pain.  Necrotic fourth  digit. EXAM: RIGHT HAND - COMPLETE 3+ VIEW COMPARISON:  Hand radiograph 06/04/2022 FINDINGS: The soft tissues of the fourth digit distally are atrophic at the level of the distal phalanx and distal aspect of the proximal phalanx, new from prior exam. No soft tissue gas. There are extensive vascular calcifications. IV catheter is seen overlying the dorsum of the wrist. Multifocal osteoarthritis. No fracture. No erosive or bony destructive change or periostitis. IMPRESSION: 1. Atrophic soft tissues of the fourth digit distally  at the level of the distal phalanx and distal aspect of the proximal phalanx, new from prior exam. No soft tissue gas or findings of osteomyelitis. 2. Multifocal osteoarthritis. Electronically Signed   By: Narda Rutherford M.D.   On: 07/15/2022 19:39   DG Chest Port 1 View  Result Date: 07/15/2022 CLINICAL DATA:  Shortness of breath. EXAM: PORTABLE CHEST 1 VIEW COMPARISON:  Chest radiograph dated 05/07/2022. FINDINGS: Shallow inspiration. Small left pleural effusion and left lung base atelectasis or infiltrate as seen on the prior radiograph and CT of 12/15/2021. The right lung is clear. Postsurgical changes of the right upper lobe. No pneumothorax. Stable cardiac silhouette. No acute osseous pathology. Thoracic spine stimulator. IMPRESSION: Small left pleural effusion and left lung base atelectasis or infiltrate. Electronically Signed   By: Elgie Collard M.D.   On: 07/15/2022 19:35    Scheduled Meds:  buprenorphine  2 mg Sublingual QHS   busPIRone  10 mg Oral QHS   busPIRone  20 mg Oral Daily   calcitRIOL  0.25 mcg Oral Daily   calcium acetate  667 mg Oral TID with meals   Chlorhexidine Gluconate Cloth  6 each Topical Q0600   escitalopram  20 mg Oral Daily   heparin sodium (porcine)  2,000 Units Intracatheter Once   levothyroxine  250 mcg Oral QAC breakfast   midodrine  10 mg Oral Q T,Th,Sa-HD   pantoprazole  40 mg Oral BID   rosuvastatin  10 mg Oral QHS   Continuous  Infusions:   ceFAZolin (ANCEF) IV     piperacillin-tazobactam (ZOSYN)  IV 2.25 g (07/17/22 1610)   vancomycin       LOS: 1 day   Hughie Closs, MD [facility]  07/17/2022, 9:52 AM   *Please note that this is a verbal dictation therefore any spelling or grammatical errors are due to the "[employer]agon [employer] One" system interpretation.  Please page via Amion and do not message via secure chat for urgent patient care matters. Secure chat can be used for non urgent patient care matters.  How to contact the [facility] Attending or Consulting provider 7A - 7P or covering provider during after hours 7P -7A, for this patient?  Check the care team in [facility] and look for a) attending/consulting TRH provider listed and b) the [facility] team listed. Page or secure chat 7A-7P. Log into [email or web address] and use [employer]'s universal password to access. If you do not have the password, please contact the hospital operator. Locate the [facility] provider you are looking for under [facility] and page to a number that you can be directly reached. If you still have difficulty reaching the provider, please page the [facility] (Director on Call) for the Hospitalists listed on amion for assistance.

## 2022-07-17 NOTE — Transfer of Care (Signed)
Immediate Anesthesia Transfer of Care Note  Patient: Chase [name]  Procedure(s) Performed: AMPUTATION OF RIGHT RING FINGER AND PARTIAL AMPUTATION OF RIGHT INDEX FINGER (Right: Hand)  Patient Location: PACU  Anesthesia Type:MAC  Level of Consciousness: drowsy and patient cooperative  Airway & Oxygen Therapy: Patient Spontanous Breathing  Post-op Assessment: Report given to RN, Post -op Vital signs reviewed and stable, and Patient moving all extremities X 4  Post vital signs: Reviewed and stable  Last Vitals:  Vitals Value Taken Time  BP 136/62 07/17/22 1704  Temp    Pulse 63 07/17/22 1705  Resp 18 07/17/22 1705  SpO2 100 % 07/17/22 1705  Vitals shown include unfiled device data.  Last Pain:  Vitals:   07/17/22 1435  TempSrc: Oral  PainSc:          Complications: No notable events documented.

## 2022-07-17 NOTE — Progress Notes (Signed)
Received patient in bed.Awake,alert and oriented x 2.  Access used Left upper arm AVF that worked well.  Duration of treatment: 3.5 hours.  Fluid removed: 2.5 liters.  Hemo comment:Tolerated treatment well.  Hand off to the patient's nurse.

## 2022-07-17 NOTE — Plan of Care (Signed)
Patient has transferred to OR. While on unit, patient was stable and no s/s of distress or verbalized. Wife was at bedside. Plan of care is ongoing. Problem: Education: Goal: Understanding of CV disease, CV risk reduction, and recovery process will improve Outcome: Progressing Goal: Individualized Educational Video(s) Outcome: Progressing   Problem: Activity: Goal: Ability to return to baseline activity level will improve Outcome: Progressing   Problem: Cardiovascular: Goal: Ability to achieve and maintain adequate cardiovascular perfusion will improve Outcome: Progressing Goal: Vascular access site(s) Level 0-1 will be maintained Outcome: Progressing   Problem: Health Behavior/Discharge Planning: Goal: Ability to safely manage health-related needs after discharge will improve Outcome: Progressing   Problem: Education: Goal: Knowledge of General Education information will improve Description: Including pain rating scale, medication(s)/side effects and non-pharmacologic comfort measures Outcome: Progressing   Problem: Health Behavior/Discharge Planning: Goal: Ability to manage health-related needs will improve Outcome: Progressing   Problem: Clinical Measurements: Goal: Ability to maintain clinical measurements within normal limits will improve Outcome: Progressing Goal: Will remain free from infection Outcome: Progressing Goal: Diagnostic test results will improve Outcome: Progressing Goal: Respiratory complications will improve Outcome: Progressing Goal: Cardiovascular complication will be avoided Outcome: Progressing   Problem: Activity: Goal: Risk for activity intolerance will decrease Outcome: Progressing   Problem: Nutrition: Goal: Adequate nutrition will be maintained Outcome: Progressing   Problem: Coping: Goal: Level of anxiety will decrease Outcome: Progressing   Problem: Elimination: Goal: Will not experience complications related to bowel  motility Outcome: Progressing Goal: Will not experience complications related to urinary retention Outcome: Progressing   Problem: Pain Managment: Goal: General experience of comfort will improve Outcome: Progressing   Problem: Safety: Goal: Ability to remain free from injury will improve Outcome: Progressing   Problem: Skin Integrity: Goal: Risk for impaired skin integrity will decrease Outcome: Progressing

## 2022-07-17 NOTE — Anesthesia Preprocedure Evaluation (Addendum)
Anesthesia Evaluation  Patient identified by MRN, date of birth, ID band Patient awake    Reviewed: Allergy & Precautions, NPO status , Patient's Chart, lab work & pertinent test results  History of Anesthesia Complications Negative for: history of anesthetic complications  Airway Mallampati: III  TM Distance: >3 FB Neck ROM: Full    Dental no notable dental hx.    Pulmonary neg pulmonary ROS   Pulmonary exam normal        Cardiovascular hypertension, + CAD and + Peripheral Vascular Disease  Normal cardiovascular exam     Neuro/Psych    Depression       GI/Hepatic Neg liver ROS,GERD  Medicated,,  Endo/Other  diabetes, Type 2Hypothyroidism    Renal/GU ESRF and DialysisRenal disease (HD M/W/F)  negative genitourinary   Musculoskeletal   Abdominal   Peds  Hematology negative hematology ROS (+)   Anesthesia Other Findings Gangrene of left ring finger  Reproductive/Obstetrics                              Anesthesia Physical Anesthesia Plan  ASA: 3  Anesthesia Plan: MAC   Post-op Pain Management: Tylenol PO (pre-op)*   Induction: Intravenous  PONV Risk Score and Plan: 1 and Treatment may vary due to age or medical condition, Midazolam, Dexamethasone and Ondansetron  Airway Management Planned: Natural Airway and Simple Face Mask  Additional Equipment: None  Intra-op Plan:   Post-operative Plan:   Informed Consent: I have reviewed the patients History and Physical, chart, labs and discussed the procedure including the risks, benefits and alternatives for the proposed anesthesia with the patient or authorized representative who has indicated his/her understanding and acceptance.       Plan Discussed with: CRNA  Anesthesia Plan Comments:          Anesthesia Quick Evaluation

## 2022-07-17 NOTE — Progress Notes (Addendum)
Patient is resting in bed with from surgery. Vitals and respirations are within normal parameters, zoysn is currently infusing due to patient not being on the unit at the time, and vanc will be given soon by nightshift nurse. I withheld all oral meds until patient is more alert. Handoff will be given.   Lawana Pai, RN

## 2022-07-18 ENCOUNTER — Encounter (HOSPITAL_COMMUNITY): Payer: Self-pay | Admitting: Orthopedic Surgery

## 2022-07-18 DIAGNOSIS — I96 Gangrene, not elsewhere classified: Secondary | ICD-10-CM | POA: Diagnosis not present

## 2022-07-18 LAB — CBC WITH DIFFERENTIAL/PLATELET
Abs Immature Granulocytes: 0.03 10*3/uL (ref 0.00–0.07)
Basophils Absolute: 0.1 10*3/uL (ref 0.0–0.1)
Basophils Relative: 1 %
Eosinophils Absolute: 0.4 10*3/uL (ref 0.0–0.5)
Eosinophils Relative: 4 %
HCT: 34.3 % — ABNORMAL LOW (ref 39.0–52.0)
Hemoglobin: 10.3 g/dL — ABNORMAL LOW (ref 13.0–17.0)
Immature Granulocytes: 0 %
Lymphocytes Relative: 16 %
Lymphs Abs: 1.3 10*3/uL (ref 0.7–4.0)
MCH: 29.2 pg (ref 26.0–34.0)
MCHC: 30 g/dL (ref 30.0–36.0)
MCV: 97.2 fL (ref 80.0–100.0)
Monocytes Absolute: 0.6 10*3/uL (ref 0.1–1.0)
Monocytes Relative: 7 %
Neutro Abs: 6 10*3/uL (ref 1.7–7.7)
Neutrophils Relative %: 72 %
Platelets: 203 10*3/uL (ref 150–400)
RBC: 3.53 MIL/uL — ABNORMAL LOW (ref 4.22–5.81)
RDW: 15.2 % (ref 11.5–15.5)
WBC: 8.3 10*3/uL (ref 4.0–10.5)
nRBC: 0 % (ref 0.0–0.2)

## 2022-07-18 LAB — BASIC METABOLIC PANEL
Anion gap: 14 (ref 5–15)
BUN: 18 mg/dL (ref 8–23)
CO2: 23 mmol/L (ref 22–32)
Calcium: 8.7 mg/dL — ABNORMAL LOW (ref 8.9–10.3)
Chloride: 98 mmol/L (ref 98–111)
Creatinine, Ser: 3.6 mg/dL — ABNORMAL HIGH (ref 0.61–1.24)
GFR, Estimated: 17 mL/min — ABNORMAL LOW (ref >60)
Glucose, Bld: 71 mg/dL (ref 70–99)
Potassium: 3.8 mmol/L (ref 3.5–5.1)
Sodium: 135 mmol/L (ref 135–145)

## 2022-07-18 LAB — CULTURE, BLOOD (ROUTINE X 2)

## 2022-07-18 MED ORDER — DOXYCYCLINE HYCLATE 100 MG PO CAPS: 100.0000 mg | ORAL_CAPSULE | Freq: Two times a day (BID) | ORAL | 0 refills | Status: AC

## 2022-07-18 MED ORDER — OXYCODONE HCL 5 MG PO TABS: 5.0000 mg | ORAL_TABLET | ORAL | 0 refills | Status: DC | PRN

## 2022-07-18 NOTE — Discharge Summary (Signed)
Physician Discharge Summary  Chase [name] XBJ:[id number] DOB: [birth year] DOA: 07/15/2022  PCP: [name], MD  Admit date: 07/15/2022 Discharge date: 07/18/2022 30 Day Unplanned Readmission Risk Score    Flowsheet Row ED to Hosp-Admission (Current) from 07/15/2022 in [facility]  30 Day Unplanned Readmission Risk Score (%) 19.14 Filed at 07/18/2022 0801       This score is the patient's risk of an unplanned readmission within 30 days of being discharged (0 -100%). The score is based on dignosis, age, lab data, medications, orders, and past utilization.   Low:  0-14.9   Medium: 15-21.9   High: 22-29.9   Extreme: 30 and above          Admitted From: SNF Disposition: SNF  Recommendations for Outpatient Follow-up:  Follow up with PCP in 1-2 weeks Please obtain BMP/CBC in one week Follow-up with hand surgery/orthopedics in 1 week, they will arrange outpatient follow-up Please follow up with your PCP on the following pending results: Unresulted Labs (From admission, onward)     Start     Ordered   07/15/22 1805  Urinalysis, w/ Reflex to Culture (Infection Suspected) -Urine, Clean Catch  Once,   URGENT       Question:  Specimen Source  Answer:  Urine, Clean Catch   07/15/22 1804              Home Health: None Equipment/Devices: None  Discharge Condition: Stable CODE STATUS: Full code Diet recommendation: Cardiac  Subjective: Seen and event.  He has no complaints.  He is fully alert and oriented.  His wife was at the bedside and witnessed patient being fully alert and oriented.  She did have some concerns that patient was not oriented earlier but at the time of my visit, he was fully oriented.  Brief/Interim Summary: 72 year old gentleman with a history of ESRD on HD, TTS schedule, CAD and hypothyroidism and hypertension and multiple other comorbidities who was admitted due to acute encephalopathy as well as right ring finger dry gangrene.  Started on  antibiotics.  Hand surgery and nephrology consulted.  Details below.   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  Radial pulse currently palpable.  exam concerning for erythema spreading proximally from the gangrenous finger.  No fever, leukocytosis, or lactic acidosis.  X-ray negative for soft tissue gas or findings of osteomyelitis.  All cultures negative.  He was started on IV vancomycin and Zosyn.  Hand surgery was consulted and patient underwent amputation of the right ring finger by Dr. Gomez Cleverly of orthopedics on 07/17/2022.  I discussed with Dr. Yehuda Budd via secure chat and he cleared the patient for discharge and he will follow-up with him in 1 week as outpatient and he recommended 7 days of oral antibiotics so I prescribed him doxycycline.   Acute metabolic encephalopathy/delirium Likely due to infection.  CT head negative for acute intracranial abnormality and no focal neurodeficit on exam.  Ammonia level normal.  Patient is now fully alert and oriented, witnessed by the wife.   Abdominal hemoperitoneum/abdominal wall hematoma at the site of recent PD catheter removal Incidentally noted on CT.  Patient is hemodynamically stable.  Abdominal exam benign and hemoglobin dropped slightly yesterday, will repeat CBC today.  ED physician discussed the case with Dr. Janee Morn with general surgery and Dr. Randie Heinz with vascular surgery who felt that there no clear indication for surgical  intervention at this time given no surgical abdomen on exam.  We held his aspirin and Plavix.  Patient's hemoglobin has remained stable.  He can resume aspirin and Plavix at discharge.   Possible cystitis Patient has complained of dysuria and CT showing likely cystitis.  Received 3 days of IV antibiotics.   Elevated troponin EKG showing borderline T wave abnormalities.  Troponin 34> 40.  ACS less  likely as patient is not having any chest pain.     Small loculated left posterior basal pleural effusion with adjacent atelectasis or consolidation Seen on CT and appears unchanged.  Patient is not endorsing any respiratory symptoms.  Not hypoxic.   8 mm right lower lobe pulmonary nodule Seen on CT and appears stable compared to previous scan from January 2023.  Patient will need outpatient follow-up.   ESRD on HD TTS Nephrology on board, he received his dialysis yesterday.  His next dialysis is scheduled Saturday.   CAD PVD Resume aspirin, Plavix and Crestor.     Hypothyroidism Continue Synthroid   Depression/anxiety Continue BuSpar and Lexapro   GERD Continue Protonix.   Type 2 diabetes ruled out Not on medications.  A1c 5.5 in October 2022, no previous values for comparison.  Repeat A1c 4.4   Chronic back and neck pain Continue buprenorphine and Oxy IR PRN  Discharge plan was discussed with patient and/or family member and they verbalized understanding and agreed with it.  Discharge Diagnoses:  Principal Problem:   Gangrene of finger (HCC) Active Problems:   Diabetes mellitus (HCC)   ESRD on dialysis [facility])   Acute metabolic encephalopathy   Hemoperitoneum   Cystitis   Elevated troponin   Pulmonary nodule    Discharge Instructions   Allergies as of 07/18/2022       Reactions   Gabapentin    Cognitive impairment per spouse   Lipitor [atorvastatin]    Severe muscle cramps per spouse   Nsaids Other (See Comments)   Severe renal impairment per [facility]   Pregabalin    Cognitive impairment. Dizziness.        Medication List     TAKE these medications    acetaminophen 500 MG tablet Commonly known as: TYLENOL Take 1,000 mg by mouth every 8 (eight) hours as needed for moderate pain or mild pain.   albuterol 108 (90 Base) MCG/ACT inhaler Commonly known as: VENTOLIN HFA Inhale 2 puffs into the lungs every 4 (four) hours as needed for wheezing or shortness  of breath.   aspirin EC 81 MG tablet Take 1 tablet (81 mg total) by mouth daily. Swallow whole.   buprenorphine 2 MG Subl SL tablet Commonly known as: SUBUTEX Place 1 tablet (2 mg total) under the tongue at bedtime.   busPIRone 10 MG tablet Commonly known as: BUSPAR Take 1-2 tablets (10-20 mg total) by mouth See admin instructions. Take 2 tablets in the AM and 1 tablet in the PM. What changed: additional instructions   calcitRIOL 0.25 MCG capsule Commonly known as: ROCALTROL Take 0.25 mcg by mouth daily.   Calcium Acetate 667 MG Tabs Take 1 tablet by mouth 3 (three) times daily.   clopidogrel 75 MG tablet Commonly known as: PLAVIX Take 1 tablet (75 mg total) by mouth daily.   Darbepoetin Alfa 100 MCG/0.5ML Sosy injection Commonly known as: ARANESP Inject 0.5 mLs (100 mcg total) into the skin every Thursday at 6pm.   doxycycline 100 MG capsule Commonly known as: VIBRAMYCIN Take 1 capsule (100 mg total) by mouth  2 (two) times daily for 7 days.   escitalopram 20 MG tablet Commonly known as: LEXAPRO Take 20 mg by mouth daily.   levothyroxine 125 MCG tablet Commonly known as: SYNTHROID Take 250 mcg by mouth daily before breakfast.   lidocaine 4 % cream Commonly known as: LMX Apply 1 Application topically once a week. Apply to fistula  prior to Dialysis- Tue, Thurs, Sat   melatonin 3 MG Tabs tablet Take 3 mg by mouth at bedtime.   midodrine 10 MG tablet Commonly known as: PROAMATINE Take 1 tablet (10 mg total) by mouth Every Tuesday,Thursday,and Saturday with dialysis. What changed: additional instructions   oxyCODONE 5 MG immediate release tablet Commonly known as: Oxy IR/ROXICODONE Take 1 tablet (5 mg total) by mouth every 4 (four) hours as needed for moderate pain. What changed:  how much to take when to take this   pantoprazole 40 MG tablet Commonly known as: PROTONIX Take 40 mg by mouth 2 (two) times daily. For GERD   rosuvastatin 10 MG tablet Commonly  known as: CRESTOR Take 10 mg by mouth at bedtime.        Follow-up Information     pcp Follow up.          Randel Pigg, Dorma Russell, MD Follow up in 1 week(s).   Specialty: Family Medicine Contact information: [phone] [place] [address] [phone]                Allergies  Allergen Reactions   Gabapentin     Cognitive impairment per spouse   Lipitor [Atorvastatin]     Severe muscle cramps per spouse   Nsaids Other (See Comments)    Severe renal impairment per [facility]   Pregabalin     Cognitive impairment. Dizziness.    Consultations: Orthopedics and nephrology.   Procedures/Studies: CT CHEST ABDOMEN PELVIS WO CONTRAST  Result Date: 07/15/2022 CLINICAL DATA:  Nonlocalized abdominal pain. History of GIST of the stomach, left nephrectomy, cholecystectomy. Recent removal of peritoneal dialysis catheter 06/30/2022. EXAM: CT CHEST, ABDOMEN AND PELVIS WITHOUT CONTRAST TECHNIQUE: Multidetector CT imaging of the chest, abdomen and pelvis was performed following the standard protocol without IV contrast. RADIATION DOSE REDUCTION: This exam was performed according to the departmental dose-optimization program which includes automated exposure control, adjustment of the mA and/or kV according to patient size and/or use of iterative reconstruction technique. COMPARISON:  Portable chest today, portable chest 05/07/2022, chest, abdomen and pelvis CTs with contrast 04/11/2022 and 10/21/2021. FINDINGS: CT CHEST FINDINGS Cardiovascular: Stable cardiomegaly with patchy three-vessel calcific CAD. No substantial pericardial effusion. Chronically prominent pulmonary trunk measuring 2.9 cm is unchanged. There are heavy calcifications and mild tortuosity of the thoracic aorta, patchy calcific plaques in the great vessels. There is no aortic aneurysm. There are calcifications along the aortic valve leaflets as well. Pulmonary veins are normal caliber. Mediastinum/Nodes: There are  multiple borderline prominent anterior and middle mediastinal lymph nodes, fever 2 slightly prominent, largest is a subcarinal lymph node measuring 1 cm in short axis. There is no new or enlarging adenopathy. The thyroid gland, axillary spaces are unremarkable. The thoracic esophagus, thoracic trachea, and main bronchi are unremarkable. Lungs/Pleura: There is a small left posterior basal pleural effusion, which now appears loculated. Left lower lobe base adjacent atelectasis or consolidation which is unchanged in appearance. The lungs exhibit scattered linear scar-like opacities, likely postsurgical scarring in the right upper lobe, with a chronically elevated right diaphragm. There is a stable 8 mm noncalcified right  lower lobe nodule on 5:85. Stable back to 01/23/2021. Left lower lobe volume loss again is suggested adjacent the pleural effusion. Central airways are patent. No new abnormality in the lung fields. Musculoskeletal: Extensive thoracic spine bridging enthesopathy no acute or other significant osseous findings. Spinal cord stimulator wiring again terminates at T6 and separately at T11. There is bridging enthesopathy along lower thoracic spinous processes. Mild gynecomastia. CT ABDOMEN PELVIS FINDINGS Hepatobiliary: Subcentimeter nonspecific hypodensity is again noted inferiorly in segment 6. No other focal liver abnormality is seen without contrast. There is increased intrahepatic and extrahepatic biliary dilatation post cholecystectomy, with common bile duct now 2 cm. The cystic duct remnant and the common bile duct are both packed with numerous subcentimeter rim calcified stones. I believe these were present previously at least on the last CT, but there was pneumobilia on that study confusing the picture. Pancreas: No focal abnormality or ductal dilatation is seen without contrast. Unchanged periampullary duodenal diverticulum. Spleen: There are calcified hilar arterial plaques. No splenomegaly. No  other focal abnormality. Adrenals/Urinary Tract: Left nephrectomy. 1.1 cm hyperdense cyst upper pole right kidney. There are scattered collecting system stones, medullary nephrocalcinosis and a 1.2 cm right renal pelvis/UPJ stone without hydronephrosis which I believe was also present on the last CT. Collecting system stones measure up to 2 cm. There are additional too small to characterize renal hypodensities in a few small cysts. There is no adrenal mass. No suspicious renal lesion without contrast. No right ureteral stone. There is mild bladder thickening and perivesical haziness consistent with cystitis. Stomach/Bowel: Somewhat elongated stomach is unchanged in appearance with mild thickened folds proximally. There is no small bowel obstruction or inflammation. There is advanced colonic diverticulosis. Chronic thickening in the wall of the sigmoid colon is unchanged, without overt inflammatory reaction. Increased rectal fecal retention is seen without findings of stercoral proctitis. Vascular/Lymphatic: There is heavy aortoiliac and visceral arterial calcific plaque no AAA. No adenopathy is seen. Reproductive: Prostate is normal in size. Other: There is hemoperitoneum with irregular Hematoma measuring 37-45 Hounsfield units collecting adjacent the left rectus sheath in the mid to lower abdomen measuring as much as 12 cm coronal, 3.4 cm AP and extending for a craniocaudal length up to 10 cm. There is a small volume of free hemorrhagic content in the distal paracolic gutters and pelvis. The hemorrhagic source is most likely from the left rectus muscle/sheath as this is where the peritoneal dialysis catheter entered. There is mild body wall anasarca which was not seen previously. There is no free air or abscess. Mild mesenteric congestive features appear similar. Musculoskeletal: There is osteopenia with degenerative changes of the lumbar spine and chronic compression fracture of L2, moderate hip DJD. No  destructive osseous or lytic lesions. In addition to bridging enthesopathy there are bridging syndesmophytes primarily of the thoracic spine. IMPRESSION: 1. Left mid to lower abdominal hemoperitoneum, most likely from the left rectus sheath/sheath as this is where the peritoneal dialysis catheter entered and was recently removed. The largest amount is in a hematoma along the inner surface of the left abdominal wall. 2. Small volume of free hemorrhagic content in the distal paracolic gutters and pelvis. 3. Likely cystitis. 4. Small loculated left posterior basal pleural effusion with adjacent atelectasis or consolidation, unchanged. 5. Stable 8 mm right lower lobe nodule back to 01/23/2021. Attention on follow-up scans recommended. 6. Cardiomegaly with aortic and coronary artery atherosclerosis. Chronic prominence of the pulmonary trunk. 7. Stable borderline prominent mediastinal lymph nodes. 8. Increased intrahepatic and  extrahepatic biliary dilatation post cholecystectomy, with cystic duct remnant and common bile duct both packed with numerous subcentimeter rim calcified stones. 9. Bilateral nephrolithiasis and medullary nephrocalcinosis with 1.2 cm right renal pelvis/UPJ stone without hydronephrosis. 10. Chronic thickening in the sigmoid colon without overt inflammatory reaction. Diffuse diverticulosis. 11. Increased rectal fecal retention. No findings of stercoral proctitis. 12. Osteopenia and degenerative/DISH change. Chronic L2 compression fracture. 13. Critical Value/emergent results were called by telephone at the time of interpretation on 07/15/2022 at 11:27 pm to provider [facility] , who verbally acknowledged these results. Electronically Signed   By: Almira Bar M.D.   On: 07/15/2022 23:37   CT Head Wo Contrast  Result Date: 07/15/2022 CLINICAL DATA:  Mental status change EXAM: CT HEAD WITHOUT CONTRAST TECHNIQUE: Contiguous axial images were obtained from the base of the skull through the vertex  without intravenous contrast. RADIATION DOSE REDUCTION: This exam was performed according to the departmental dose-optimization program which includes automated exposure control, adjustment of the mA and/or kV according to patient size and/or use of iterative reconstruction technique. COMPARISON:  CT 12/15/2021 FINDINGS: Brain: No evidence of acute infarction, hemorrhage, hydrocephalus, extra-axial collection or mass lesion/mass effect. There is moderate diffuse atrophy. There is moderate patchy periventricular and deep white matter hypodensity, likely chronic small vessel ischemic change. This is similar to prior. 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 Vascular: There are atherosclerotic calcifications of the intracranial internal carotid arteries, vertebral arteries and peripheral vasculature. Skull: Normal. Negative for fracture or focal lesion. Sinuses/Orbits: No acute finding. Other: None. IMPRESSION: 1. No acute intracranial process. 2. Moderate chronic small vessel ischemic change and atrophy. Electronically Signed   By: Darliss Cheney M.D.   On: 07/15/2022 22:52   DG Hand Complete Right  Result Date: 07/15/2022 CLINICAL DATA:  Pain.  Necrotic fourth digit. EXAM: RIGHT HAND - COMPLETE 3+ VIEW COMPARISON:  Hand radiograph 06/04/2022 FINDINGS: The soft tissues of the fourth digit distally are atrophic at the level of the distal phalanx and distal aspect of the proximal phalanx, new from prior exam. No soft tissue gas. There are extensive vascular calcifications. IV catheter is seen overlying the dorsum of the wrist. Multifocal osteoarthritis. No fracture. No erosive or bony destructive change or periostitis. IMPRESSION: 1. Atrophic soft tissues of the fourth digit distally at the level of the distal phalanx and distal aspect of the proximal phalanx, new from prior exam. No soft tissue gas or findings of osteomyelitis. 2. Multifocal osteoarthritis. Electronically Signed   By: Narda Rutherford  M.D.   On: 07/15/2022 19:39   DG Chest Port 1 View  Result Date: 07/15/2022 CLINICAL DATA:  Shortness of breath. EXAM: PORTABLE CHEST 1 VIEW COMPARISON:  Chest radiograph dated 05/07/2022. FINDINGS: Shallow inspiration. Small left pleural effusion and left lung base atelectasis or infiltrate as seen on the prior radiograph and CT of 12/15/2021. The right lung is clear. Postsurgical changes of the right upper lobe. No pneumothorax. Stable cardiac silhouette. No acute osseous pathology. Thoracic spine stimulator. IMPRESSION: Small left pleural effusion and left lung base atelectasis or infiltrate. Electronically Signed   By: Elgie Collard M.D.   On: 07/15/2022 19:35   PERIPHERAL VASCULAR CATHETERIZATION  Result Date: 06/25/2022 Images from the original result were not included.   Patient name: Chase [name]     MRN: [id number]        DOB: September 09, 1950          Sex: male  06/25/2022 Pre-operative Diagnosis: Critical limb ischemia of  the right upper extremity with tissue loss with gangrene of the fingertips Post-operative diagnosis:  Same Surgeon:  Cephus Shelling, MD Procedure Performed: 1.  Ultrasound-guided access right common femoral artery 2.  Limited arch aortogram 3.  Catheter selection of right axillary artery with dedicated right upper extremity arteriogram and catheter selection of right radial artery 4.  Right radial artery angioplasty (2.5 mm x 80 mm Sterling) for high grade 90% stenosis 5.  Mynx closure of the right common femoral artery 6.  35 minutes of monitored moderate conscious sedation time  Indications: 72 year old male with end-stage renal disease that vascular surgery was consulted for ischemic changes to the right fingertips on Monday night.  He presents for arch aortogram with right upper extremity arteriogram and possible intervention after risks benefits discussed.  Findings:  Ultrasound-guided access right common femoral artery.  Arch aortogram showed patent arch vessels including  innominate and right subclavian artery.  We then cannulated the innominate with a headhunter catheter and got a wire out into the right axillary artery and advanced our catheter into the right axillary artery.  Injections of the right upper extremity showed widely patent brachial artery.  He has significant disease below the elbow.  The radial artery was heavily calcified and tortuous and had a high-grade 90% stenosis in the proximal vessel.  There was also a second approximate 50% stenosis at the wrist.  Interosseous was patent.  The ulnar artery was diffusely diseased calcified and small and occluded at the wrist with no filling of the palmar arch.  Significant small vessel disease in the hand with no digital vessels identified.  From right transfemoral access, I was able to angioplasty the proximal right radial artery stenosis with a 2.5 mm x 80 mm Sterling with excellent results and widely patent proximal vessel.  We gave nitroglycerin throughout the case.  Tried to get down to the radial artery disease at the wrist into the hand and we did not have any balloon shaft long enough to reach lesion.  Brisk right radial artery signal at completion with preserved runoff into hand.  Procedure:  The patient was identified in the holding area and taken to room 8.  The patient was then placed supine on the table and prepped and draped in the usual sterile fashion.  A time out was called.  Ultrasound was used to evaluate the right common femoral artery.  It was patent .  A digital ultrasound image was acquired.  A micropuncture needle was used to access the right common femoral artery under ultrasound guidance.  An 018 wire was advanced without resistance and a micropuncture sheath was placed.  The 018 wire was removed and a benson wire was placed.  Pigtail catheter was advanced into the ascending aorta and an arch aortogram was given.  Prior to this we did give 5000 units of IV heparin.  After the arch aortogram used a  headhunter catheter with a Glidewire advantage to cannulate the innominate I got my wire out through the subclavian and advanced the catheter into the right axillary artery.  We then gave nitro and got dedicated right upper extremity angiogram images down to the hand.  Pertinent findings are noted above.  He has significant disease distal to the brachial artery in the forearm and hand.  Ultimately I did not think intervention on the ulnar artery was an option as this essentially occluded at the wrist with no filling of the palmar arch. I was able to get into the  radial artery with a V18 and quick cross catheter in the proximal vessel and got dedicated hand injection imaging.  I then performed angioplasty of the proximal vessel with a 2.5 mm x 80 mm Sterling to nominal pressure for 2 minutes for the high-grade stenosis.  I was able to get my wire distally across the wrist for the second lesion but the balloon would not reach the lesion and this was the longest shaft length we had.  Essentially wires and catheters were removed and we put a short 5 French sheath right groin with a mynx closure device  Findings: Good results after angioplasty of high-grade proximal right radial stenosis at this is dominant runoff into the hand.  He still has significant small vessel disease in the hand and ulnar artery occludes at the wrist and is diminutive and diseased.  Aspirin statin for now.  Will start Plavix when okay from hand surgery.  Cephus Shelling, MD [facility] Office: [phone]     Discharge Exam: Vitals:   07/18/22 0516 07/18/22 0801  BP: (!) 115/44 (!) 124/90  Pulse: 66 66  Resp: 18 16  Temp: 98.7 F (37.1 C) 98.2 F (36.8 C)  SpO2: 96% 96%   Vitals:   07/17/22 2004 07/18/22 0018 07/18/22 0516 07/18/22 0801  BP: (!) 111/44 (!) 104/48 (!) 115/44 (!) 124/90  Pulse: 61 60 66 66  Resp: 18 18 18 16   Temp: (!) 97.3 F (36.3 C) 98.2 F (36.8 C) 98.7 F (37.1 C)  98.2 F (36.8 C)  TempSrc:    Oral  SpO2: 91% 91% 96% 96%  Weight:      Height:        General: Pt is alert, awake, not in acute distress Cardiovascular: RRR, S1/S2 +, no rubs, no gallops Respiratory: CTA bilaterally, no wheezing, no rhonchi Abdominal: Soft, NT, ND, bowel sounds + Extremities: no edema, no cyanosis    The results of significant diagnostics from this hospitalization (including imaging, microbiology, ancillary and laboratory) are listed below for reference.     Microbiology: Recent Results (from the past 240 hour(s))  Culture, blood (routine x 2)     Status: None (Preliminary result)   Collection Time: 07/15/22  6:47 PM   Specimen: BLOOD RIGHT HAND  Result Value Ref Range Status   Specimen Description BLOOD RIGHT HAND  Final   Special Requests   Final    BOTTLES DRAWN AEROBIC ONLY Blood Culture results may not be optimal due to an inadequate volume of blood received in culture bottles   Culture  Setup Time   Final    GRAM POSITIVE RODS AEROBIC BOTTLE ONLY CRITICAL RESULT CALLED TO, READ BACK BY AND VERIFIED WITH: PHARMD J. LEDFORD 07/18/22 @ 24401 BY AB Performed at [facility], [address] [place], [place] [address]    Culture GRAM POSITIVE RODS  Final   Report Status PENDING  Incomplete  Culture, blood (routine x 2)     Status: None (Preliminary result)   Collection Time: 07/15/22  6:47 PM   Specimen: BLOOD RIGHT HAND  Result Value Ref Range Status   Specimen Description BLOOD RIGHT HAND  Final   Special Requests   Final    BOTTLES DRAWN AEROBIC ONLY Blood Culture adequate volume   Culture   Final    NO GROWTH 3 DAYS Performed at [facility], [address] [place], [place] [address]    Report Status PENDING  Incomplete  Labs: BNP (last 3 results) No results for input(s): "BNP" in the last 8760 hours. Basic Metabolic Panel: Recent Labs  Lab 07/15/22 1851 07/15/22 1907 07/15/22 1908 07/16/22 0255 07/17/22 1535  07/18/22 0848  NA 139 137 137 139 135 135  K 4.8 4.7 4.8 3.8 4.3 3.8  CL 100 100  --  99 99 98  CO2 25  --   --  25  --  23  GLUCOSE 100* 97  --  80 78 71  BUN 16 20  --  18 14 18   CREATININE 2.91* 3.20*  --  3.31* 2.70* 3.60*  CALCIUM 8.7*  --   --  8.9  --  8.7*   Liver Function Tests: Recent Labs  Lab 07/15/22 1851  AST 23  ALT 6  ALKPHOS 112  BILITOT 1.1  PROT 6.3*  ALBUMIN 2.5*   No results for input(s): "LIPASE", "AMYLASE" in the last 168 hours. Recent Labs  Lab 07/16/22 0100  AMMONIA 19   CBC: Recent Labs  Lab 07/15/22 1851 07/15/22 1907 07/15/22 1908 07/16/22 0255 07/17/22 1404 07/17/22 1535 07/18/22 0848  WBC 7.9  --   --  7.6 13.5*  --  8.3  NEUTROABS 5.7  --   --   --  11.3*  --  6.0  HGB 9.5*   < > 10.5* 9.1* 10.6* 11.9* 10.3*  HCT 30.6*   < > 31.0* 30.4* 34.7* 35.0* 34.3*  MCV 99.4  --   --  98.7 98.6  --  97.2  PLT 185  --   --  177 189  --  203   < > = values in this interval not displayed.   Cardiac Enzymes: Recent Labs  Lab 07/15/22 1851  CKTOTAL 151   BNP: Invalid input(s): "POCBNP" CBG: Recent Labs  Lab 07/17/22 1706 07/17/22 1811  GLUCAP 78 79   D-Dimer No results for input(s): "DDIMER" in the last 72 hours. Hgb A1c Recent Labs    07/16/22 0255  HGBA1C 4.4*   Lipid Profile No results for input(s): "CHOL", "HDL", "LDLCALC", "TRIG", "CHOLHDL", "LDLDIRECT" in the last 72 hours. Thyroid function studies No results for input(s): "TSH", "T4TOTAL", "T3FREE", "THYROIDAB" in the last 72 hours.  Invalid input(s): "FREET3" Anemia work up No results for input(s): "VITAMINB12", "FOLATE", "FERRITIN", "TIBC", "IRON", "RETICCTPCT" in the last 72 hours. Urinalysis    Component Value Date/Time   COLORURINE STRAW (A) 06/27/2022 1351   APPEARANCEUR CLEAR 06/27/2022 1351   LABSPEC 1.006 06/27/2022 1351   PHURINE 9.0 (H) 06/27/2022 1351   GLUCOSEU 150 (A) 06/27/2022 1351   HGBUR LARGE (A) 06/27/2022 1351   BILIRUBINUR NEGATIVE  06/27/2022 1351   KETONESUR NEGATIVE 06/27/2022 1351   PROTEINUR 30 (A) 06/27/2022 1351   NITRITE NEGATIVE 06/27/2022 1351   LEUKOCYTESUR MODERATE (A) 06/27/2022 1351   Sepsis Labs Recent Labs  Lab 07/15/22 1851 07/16/22 0255 07/17/22 1404 07/18/22 0848  WBC 7.9 7.6 13.5* 8.3   Microbiology Recent Results (from the past 240 hour(s))  Culture, blood (routine x 2)     Status: None (Preliminary result)   Collection Time: 07/15/22  6:47 PM   Specimen: BLOOD RIGHT HAND  Result Value Ref Range Status   Specimen Description BLOOD RIGHT HAND  Final   Special Requests   Final    BOTTLES DRAWN AEROBIC ONLY Blood Culture results may not be optimal due to an inadequate volume of blood received in culture bottles   Culture  Setup Time   Final  GRAM POSITIVE RODS AEROBIC BOTTLE ONLY CRITICAL RESULT CALLED TO, READ BACK BY AND VERIFIED WITH: PHARMD J. LEDFORD 07/18/22 @ 84132 BY AB Performed at [facility], [address], [place], [place] [address]    Culture GRAM POSITIVE RODS  Final   Report Status PENDING  Incomplete  Culture, blood (routine x 2)     Status: None (Preliminary result)   Collection Time: 07/15/22  6:47 PM   Specimen: BLOOD RIGHT HAND  Result Value Ref Range Status   Specimen Description BLOOD RIGHT HAND  Final   Special Requests   Final    BOTTLES DRAWN AEROBIC ONLY Blood Culture adequate volume   Culture   Final    NO GROWTH 3 DAYS Performed at [facility], [address], [place], [place] [address]    Report Status PENDING  Incomplete    FURTHER DISCHARGE INSTRUCTIONS:   Get Medicines reviewed and adjusted: Please take all your medications with you for your next visit with your Primary MD   Laboratory/radiological data: Please request your Primary MD to go over all hospital tests and procedure/radiological results at the follow up, please ask your Primary MD to get all Hospital records sent to his/her office.   In some cases, they will be  blood work, cultures and biopsy results pending at the time of your discharge. Please request that your primary care M.D. goes through all the records of your hospital data and follows up on these results.   Also Note the following: If you experience worsening of your admission symptoms, develop shortness of breath, life threatening emergency, suicidal or homicidal thoughts you must seek medical attention immediately by calling 911 or calling your MD immediately  if symptoms less severe.   You must read complete instructions/literature along with all the possible adverse reactions/side effects for all the Medicines you take and that have been prescribed to you. Take any new Medicines after you have completely understood and accpet all the possible adverse reactions/side effects.    Do not drive when taking Pain medications or sleeping medications (Benzodaizepines)   Do not take more than prescribed Pain, Sleep and Anxiety Medications. It is not advisable to combine anxiety,sleep and pain medications without talking with your primary care practitioner   Special Instructions: If you have smoked or chewed Tobacco  in the last 2 yrs please stop smoking, stop any regular Alcohol  and or any Recreational drug use.   Wear Seat belts while driving.   Please note: You were cared for by a hospitalist during your hospital stay. Once you are discharged, your primary care physician will handle any further medical issues. Please note that NO REFILLS for any discharge medications will be authorized once you are discharged, as it is imperative that you return to your primary care physician (or establish a relationship with a primary care physician if you do not have one) for your post hospital discharge needs so that they can reassess your need for medications and monitor your lab values  Time coordinating discharge: Over 30 minutes  SIGNED:   Hughie Closs, MD  [facility] 07/18/2022, 11:41  AM *Please note that this is a verbal dictation therefore any spelling or grammatical errors are due to the "Dragon Medical One" system interpretation. If 7PM-7AM, please contact night-coverage [email or web address]

## 2022-07-18 NOTE — Progress Notes (Signed)
[facility] Progress Note   Subjective: Being discharged today. Will resume HD at OP center on schedule    Objective Vitals:   07/17/22 2004 07/18/22 0018 07/18/22 0516 07/18/22 0801  BP: (!) 111/44 (!) 104/48 (!) 115/44 (!) 124/90  Pulse: 61 60 66 66  Resp: 18 18 18 16   Temp: (!) 97.3 F (36.3 C) 98.2 F (36.8 C) 98.7 F (37.1 C) 98.2 F (36.8 C)  TempSrc:    Oral  SpO2: 91% 91% 96% 96%  Weight:      Height:       Physical Exam General: Chronically ill appearing Neuro: Oriented to self only Heart: S1,S2 No M/R/G Lungs:CTAB Abdomen: NABS, NT Extremities:No LE edema Dialysis Access: L AVF + T/B  Additional Objective Labs: Basic Metabolic Panel: Recent Labs  Lab 07/15/22 1851 07/15/22 1907 07/16/22 0255 07/17/22 1535 07/18/22 0848  NA 139   < > 139 135 135  K 4.8   < > 3.8 4.3 3.8  CL 100   < > 99 99 98  CO2 25  --  25  --  23  GLUCOSE 100*   < > 80 78 71  BUN 16   < > 18 14 18   CREATININE 2.91*   < > 3.31* 2.70* 3.60*  CALCIUM 8.7*  --  8.9  --  8.7*   < > = values in this interval not displayed.   Liver Function Tests: Recent Labs  Lab 07/15/22 1851  AST 23  ALT 6  ALKPHOS 112  BILITOT 1.1  PROT 6.3*  ALBUMIN 2.5*   No results for input(s): "LIPASE", "AMYLASE" in the last 168 hours. CBC: Recent Labs  Lab 07/15/22 1851 07/15/22 1907 07/16/22 0255 07/17/22 1404 07/17/22 1535 07/18/22 0848  WBC 7.9  --  7.6 13.5*  --  8.3  NEUTROABS 5.7  --   --  11.3*  --  6.0  HGB 9.5*   < > 9.1* 10.6* 11.9* 10.3*  HCT 30.6*   < > 30.4* 34.7* 35.0* 34.3*  MCV 99.4  --  98.7 98.6  --  97.2  PLT 185  --  177 189  --  203   < > = values in this interval not displayed.   Blood Culture    Component Value Date/Time   SDES BLOOD RIGHT HAND 07/15/2022 1847   SDES BLOOD RIGHT HAND 07/15/2022 1847   SPECREQUEST  07/15/2022 1847    BOTTLES DRAWN AEROBIC ONLY Blood Culture results may not be optimal due to an inadequate volume of blood received  in culture bottles   SPECREQUEST  07/15/2022 1847    BOTTLES DRAWN AEROBIC ONLY Blood Culture adequate volume   CULT GRAM POSITIVE RODS 07/15/2022 1847   CULT  07/15/2022 1847    NO GROWTH 3 DAYS Performed at [facility], [address] [place], [place] [address]    REPTSTATUS PENDING 07/15/2022 1847   REPTSTATUS PENDING 07/15/2022 1847    Cardiac Enzymes: Recent Labs  Lab 07/15/22 1851  CKTOTAL 151   CBG: Recent Labs  Lab 07/17/22 1706 07/17/22 1811  GLUCAP 78 79   Iron Studies: No results for input(s): "IRON", "TIBC", "TRANSFERRIN", "FERRITIN" in the last 72 hours. @lablastinr3 @ Studies/Results: No results found. Medications:  sodium chloride     piperacillin-tazobactam (ZOSYN)  IV 2.25 g (07/18/22 0503)   vancomycin 1,000 mg (07/17/22 1945)    buprenorphine  2 mg Sublingual QHS   busPIRone  10 mg Oral QHS  busPIRone  20 mg Oral Daily   calcitRIOL  0.25 mcg Oral Daily   calcium acetate  667 mg Oral TID with meals   Chlorhexidine Gluconate Cloth  6 each Topical Q0600   escitalopram  20 mg Oral Daily   levothyroxine  250 mcg Oral QAC breakfast   midodrine  10 mg Oral Q T,Th,Sa-HD   pantoprazole  40 mg Oral BID   rosuvastatin  10 mg Oral QHS     OP HD: [facility] [phone]) 3.5 hrs 2.0 K/2.5 Ca 88 kg AVF  - Heparin 4950 units IV (3700 units IV initial bolus then 500 units IV hourly-stop 1 hour prior to end of tx.) -Epogen 10800 units IV TIW    Labs --> Na 139  K 3.8  CO2 25  bun 18  creat 3.31  alb 2.5  Hb 9.1 wbc 7k     Assessment/ Plan: AMS - likely due to sepsis. CT head negative. Per pmd.  Ischemic/ gangrenous R hand finger #2 - IV abx started per pmd. 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  ESRD - on HD TTS.HD at OP clinic tomorrow.  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  MBD ckd - CCa in range, will add on phos.  Chronic pain syndrome - on  buprenorphine PAD - on asa and plavix DNR at OP clinic   [facility] H. Zakiya Sporrer NP-C [name], 1:42 PM  [employer] [phone]

## 2022-07-18 NOTE — Progress Notes (Signed)
D/C order noted. Contacted [facility] and spoke to [place]. Clinic advised of pt's d/c today back to snf and that pt should resume care tomorrow. Clinic has access to [facility] Epic to obtain needed clinicals.  Olivia Canter Renal Navigator [phone]

## 2022-07-18 NOTE — [place] FL2 (Signed)
[place] MEDICAID FL2 LEVEL OF CARE FORM     IDENTIFICATION  Patient Name: Chase [name] Birthdate: July 05, 1950 Sex: male Admission Date (Current Location): 07/15/2022  [facility] [place] and [place] Number:  Producer, television/film/video and Address:  [facility], [address], [place], [place] [address]      Provider Number: [id number]  Attending Physician Name and Address:  Hughie Closs, MD   Relative Name and Phone Number:  NANAYAW, WENNBERG (Spouse)  [phone]    Current Level of Care: Hospital Recommended Level of Care: Skilled Nursing Facility Prior Approval Number:    Date Approved/Denied:  06/20/2022 Expires: 09/18/2022 PASRR Number: [id number]  Discharge Plan: SNF    Current Diagnoses: Patient Active Problem List   Diagnosis Date Noted   Gangrene of finger (HCC) 07/16/2022   Acute metabolic encephalopathy 07/16/2022   Hemoperitoneum 07/16/2022   Cystitis 07/16/2022   Elevated troponin 07/16/2022   Pulmonary nodule 07/16/2022   Intraoperative ischemia of hand 07/01/2022   Right hand pain 07/01/2022   Hypothyroidism 06/25/2022   Hypertension 06/25/2022   Anemia of chronic disease 06/25/2022   Depression 06/25/2022   Gastroesophageal reflux disease 06/25/2022   Hypomagnesemia 06/25/2022   Hyponatremia 06/25/2022   Obesity (BMI 30-39.9) 06/25/2022   ESRD on dialysis (HCC) 06/25/2022   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) 06/23/2022   History of traumatic head injury 02/17/2022   Acute subdural hematoma (HCC) 10/25/2020   ESRD on peritoneal dialysis (HCC) 10/25/2020   Leukocytosis 10/25/2020   Fall at home, initial encounter 10/25/2020   Abnormal urinalysis 10/25/2020   Chronic pain 10/25/2020   Diabetes mellitus (HCC) 10/25/2020   Dialysis patient (HCC) 07/09/2020   Back pain 07/09/2020    Orientation RESPIRATION BLADDER Height & Weight     Self, Time  Normal Continent Weight: 185 lb 3 oz (84 kg) Height:  5\' 6"  (167.6 cm)  BEHAVIORAL  SYMPTOMS/MOOD NEUROLOGICAL BOWEL NUTRITION STATUS      Continent Diet (see dicharge summary)  AMBULATORY STATUS COMMUNICATION OF NEEDS Skin   Limited Assist Verbally Surgical wounds, Other (Comment) (closed incision right finger. Right posterior thigh, wound.)                       Personal Care Assistance Level of Assistance  Bathing, Feeding, Dressing Bathing Assistance: Maximum assistance Feeding assistance: Limited assistance Dressing Assistance: Maximum assistance     Functional Limitations Info  Sight, Hearing, Speech Sight Info: Impaired (wears glasses) Hearing Info: Adequate Speech Info: Adequate    SPECIAL CARE FACTORS FREQUENCY  PT (By licensed PT), OT (By licensed OT)     PT Frequency: 5x/week OT Frequency: 5x/week            Contractures Contractures Info: Not present    Additional Factors Info  Code Status, Allergies Code Status Info: FULL Allergies Info: Gabapentin  Lipitor (Atorvastatin)  Nsaids  Pregabalin           Current Medications (07/18/2022):  This is the current hospital active medication list Current Facility-Administered Medications  Medication Dose Route Frequency Provider Last Rate Last Admin   0.9 %  sodium chloride infusion   Intravenous Continuous Stephannie Peters, Jaclynn Major, MD   New Bag at 07/17/22 1550   acetaminophen (TYLENOL) tablet 650 mg  650 mg Oral Q6H PRN John Giovanni, MD   650 mg at 07/16/22 0351   Or   acetaminophen (TYLENOL) suppository 650 mg  650 mg Rectal Q6H PRN John Giovanni, MD  buprenorphine (SUBUTEX) SL tablet 2 mg  2 mg Sublingual QHS John Giovanni, MD   2 mg at 07/17/22 1943   busPIRone (BUSPAR) tablet 10 mg  10 mg Oral QHS John Giovanni, MD   10 mg at 07/17/22 1942   busPIRone (BUSPAR) tablet 20 mg  20 mg Oral Daily John Giovanni, MD   20 mg at 07/18/22 1117   calcitRIOL (ROCALTROL) capsule 0.25 mcg  0.25 mcg Oral Daily John Giovanni, MD   0.25 mcg at 07/18/22 1117   calcium acetate  (PHOSLO) capsule 667 mg  667 mg Oral TID with meals John Giovanni, MD   667 mg at 07/18/22 1117   Chlorhexidine Gluconate Cloth 2 % PADS 6 each  6 each Topical Q0600 Delano Metz, MD   6 each at 07/18/22 0525   escitalopram (LEXAPRO) tablet 20 mg  20 mg Oral Daily John Giovanni, MD   20 mg at 07/18/22 1117   levothyroxine (SYNTHROID) tablet 250 mcg  250 mcg Oral QAC breakfast John Giovanni, MD   250 mcg at 07/18/22 0503   midodrine (PROAMATINE) tablet 10 mg  10 mg Oral Q T,Th,Sa-HD John Giovanni, MD       ondansetron [facility]) injection 4 mg  4 mg Intravenous Q6H PRN Hughie Closs, MD   4 mg at 07/18/22 0214   oxyCODONE (Oxy IR/ROXICODONE) immediate release tablet 2.5 mg  2.5 mg Oral Q6H PRN John Giovanni, MD   2.5 mg at 07/18/22 1404   pantoprazole (PROTONIX) EC tablet 40 mg  40 mg Oral BID John Giovanni, MD   40 mg at 07/18/22 1116   piperacillin-tazobactam (ZOSYN) IVPB 2.25 g  2.25 g Intravenous Q8H Arabella Merles, RPH 100 mL/hr at 07/18/22 0503 2.25 g at 07/18/22 0503   rosuvastatin (CRESTOR) tablet 10 mg  10 mg Oral QHS John Giovanni, MD   10 mg at 07/17/22 1943   vancomycin (VANCOCIN) IVPB 1000 mg/200 mL premix  1,000 mg Intravenous Q T,Th,Sa-HD Andreas Ohm, RPH 200 mL/hr at 07/17/22 1945 1,000 mg at 07/17/22 1945   Current Outpatient Medications  Medication Sig Dispense Refill   acetaminophen (TYLENOL) 500 MG tablet Take 1,000 mg by mouth every 8 (eight) hours as needed for moderate pain or mild pain.     albuterol (VENTOLIN HFA) 108 (90 Base) MCG/ACT inhaler Inhale 2 puffs into the lungs every 4 (four) hours as needed for wheezing or shortness of breath.     aspirin EC 81 MG tablet Take 1 tablet (81 mg total) by mouth daily. Swallow whole. 30 tablet 12   buprenorphine (SUBUTEX) 2 MG SUBL SL tablet Place 1 tablet (2 mg total) under the tongue at bedtime. 12 tablet 0   busPIRone (BUSPAR) 10 MG tablet Take 1-2 tablets (10-20 mg total) by mouth See admin  instructions. Take 2 tablets in the AM and 1 tablet in the PM. (Patient taking differently: Take 10-20 mg by mouth See admin instructions. Take 2 tablets by mouth in the morning and 1 tablet in the at bedtime) 10 tablet 0   calcitRIOL (ROCALTROL) 0.25 MCG capsule Take 0.25 mcg by mouth daily.     Calcium Acetate 667 MG TABS Take 1 tablet by mouth 3 (three) times daily.     clopidogrel (PLAVIX) 75 MG tablet Take 1 tablet (75 mg total) by mouth daily. 30 tablet 1   Darbepoetin Alfa (ARANESP) 100 MCG/0.5ML SOSY injection Inject 0.5 mLs (100 mcg total) into the skin every Thursday at 6pm. 4.2 mL    escitalopram (  LEXAPRO) 20 MG tablet Take 20 mg by mouth daily.     levothyroxine (SYNTHROID) 125 MCG tablet Take 250 mcg by mouth daily before breakfast.     lidocaine (LMX) 4 % cream Apply 1 Application topically once a week. Apply to fistula  prior to Dialysis- Tue, Thurs, Sat     melatonin 3 MG TABS tablet Take 3 mg by mouth at bedtime.     midodrine (PROAMATINE) 10 MG tablet Take 1 tablet (10 mg total) by mouth Every Tuesday,Thursday,and Saturday with dialysis. (Patient taking differently: Take 10 mg by mouth Every Tuesday,Thursday,and Saturday with dialysis. May also give 1 tablet for sBP less than 90) 12 tablet 1   pantoprazole (PROTONIX) 40 MG tablet Take 40 mg by mouth 2 (two) times daily. For GERD     rosuvastatin (CRESTOR) 10 MG tablet Take 10 mg by mouth at bedtime.     doxycycline (VIBRAMYCIN) 100 MG capsule Take 1 capsule (100 mg total) by mouth 2 (two) times daily for 7 days. 14 capsule 0   oxyCODONE (OXY IR/ROXICODONE) 5 MG immediate release tablet Take 1 tablet (5 mg total) by mouth every 4 (four) hours as needed for moderate pain. 12 tablet 0     Discharge Medications: Please see discharge summary for a list of discharge medications.  Relevant Imaging Results:  Relevant Lab Results:   Additional Information [id number];  [facility] 11:30 am   [place],  [place]

## 2022-07-18 NOTE — Plan of Care (Signed)
Patient is resting in chair with wife at bedside. Vitals and respirations are normal. Pain has been addressed and managed. Patient is being prepared for discharge. Plan of care is ongoing.  Problem: Education: Goal: Understanding of CV disease, CV risk reduction, and recovery process will improve Outcome: Progressing Goal: Individualized Educational Video(s) Outcome: Progressing   Problem: Activity: Goal: Ability to return to baseline activity level will improve Outcome: Progressing   Problem: Cardiovascular: Goal: Ability to achieve and maintain adequate cardiovascular perfusion will improve Outcome: Progressing Goal: Vascular access site(s) Level 0-1 will be maintained Outcome: Progressing   Problem: Health Behavior/Discharge Planning: Goal: Ability to safely manage health-related needs after discharge will improve Outcome: Progressing   Problem: Education: Goal: Knowledge of General Education information will improve Description: Including pain rating scale, medication(s)/side effects and non-pharmacologic comfort measures Outcome: Progressing   Problem: Health Behavior/Discharge Planning: Goal: Ability to manage health-related needs will improve Outcome: Progressing   Problem: Clinical Measurements: Goal: Ability to maintain clinical measurements within normal limits will improve Outcome: Progressing Goal: Will remain free from infection Outcome: Progressing Goal: Diagnostic test results will improve Outcome: Progressing Goal: Respiratory complications will improve Outcome: Progressing Goal: Cardiovascular complication will be avoided Outcome: Progressing   Problem: Activity: Goal: Risk for activity intolerance will decrease Outcome: Progressing   Problem: Nutrition: Goal: Adequate nutrition will be maintained Outcome: Progressing   Problem: Coping: Goal: Level of anxiety will decrease Outcome: Progressing   Problem: Elimination: Goal: Will not experience  complications related to bowel motility Outcome: Progressing Goal: Will not experience complications related to urinary retention Outcome: Progressing   Problem: Pain Managment: Goal: General experience of comfort will improve Outcome: Progressing   Problem: Safety: Goal: Ability to remain free from injury will improve Outcome: Progressing   Problem: Skin Integrity: Goal: Risk for impaired skin integrity will decrease Outcome: Progressing

## 2022-07-18 NOTE — TOC Transition Note (Signed)
Transition of Care [facility]) - CM/SW Discharge Note   Patient Details  Name: Chase [name] MRN: [id number] Date of Birth: 1950/04/02  [facility] CM/SW Contact:  Latham Kinzler A [place], Theresia Majors Phone Number: 07/18/2022, 4:11 PM   Clinical Narrative:     Patient will DC to: [facility]  Anticipated DC date: 07/18/22  Family notified: Laurette Schimke  Transport by: Sharin Mons      Per MD patient ready for DC to [facility] . RN, patient, patient's family, and facility notified of DC. Discharge Summary and FL2 sent to facility. RN to call report prior to discharge ([phone], [phone]). DC packet on chart. Ambulance transport requested for patient.     CSW will sign off for now as social work intervention is no longer needed. Please consult [place] again if new needs arise.   Final next level of care: Skilled Nursing Facility Barriers to Discharge: No Barriers Identified   Patient Goals and CMS Choice      Discharge Placement                Patient chooses bed at: [facility] Patient to be transferred to facility by: [facility] Name of family member notified: Daylan Gawron Patient and family notified of of transfer: 07/18/22  Discharge Plan and Services Additional resources added to the After Visit Summary for                                       Social Determinants of Health (SDOH) Interventions SDOH Screenings   Food Insecurity: No Food Insecurity (07/16/2022)  Housing: Low Risk  (07/16/2022)  Transportation Needs: No Transportation Needs (07/16/2022)  Utilities: Not At Risk (07/16/2022)  Financial Resource Strain: Low Risk  (09/16/2021)   Received from [facility], [facility]  Physical Activity: Inactive (09/16/2021)   Received from [facility], [facility]  Social Connections: Socially Integrated (09/16/2021)   Received from [facility], [facility]  Stress: Patient Declined (09/16/2021)   Received from [facility], [facility]   Tobacco Use: Low Risk  (07/17/2022)     Readmission Risk Interventions    07/16/2022    3:22 PM  Readmission Risk Prevention Plan  Transportation Screening Complete  PCP or Specialist Appt within 5-7 Days Complete  Home Care Screening Complete  Medication Review (RN CM) Complete

## 2022-07-18 NOTE — Discharge Planning (Signed)
Patient is being prepared for discharge back to [facility]. Education and IV removal has been completed by discharge nurse. Patient, wife and nursing staff at [facility], [employer]) has been educated on surgeon's note for the hand care as specified "Postoperative day 2 or 3 the bandages can be changed with nonadherent at the tip of the digit followed by a soft wrap." No confusion from staff or patient/family. PTAR is to arrive in 2 hours.   Lawana Pai, RN

## 2022-07-18 NOTE — Progress Notes (Signed)
PHARMACY - PHYSICIAN COMMUNICATION CRITICAL VALUE ALERT - BLOOD CULTURE IDENTIFICATION (BCID)  Chase [name] is an 72 y.o. male who presented to [facility] on 07/15/2022 with a chief complaint of gangrenous finger  7/9 Blood: 1/2 Gram positive rods  Name of physician (or Provider) Contacted: Dr. Arlean Hopping  Current antibiotics: Vancomycin, Zosyn  Changes to prescribed antibiotics recommended:  None  No results found for this or any previous visit.  Abran Duke, PharmD, BCPS Clinical Pharmacist Phone: [phone]

## 2022-07-18 NOTE — Evaluation (Signed)
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  Pt with hx including but not limited to ESRD on HD TTS, CAD, hypothyroidism, HTN, DM, Hx TBI   Clinical Impression   PTA, pt was at [facility] rehabilitation and was receiving assist with ADL and mobility. Upon eval, pt performing UB ADL with min-mod A and LB ADL with max A. Pt educated and demonstrating use RUE HEP to optimize mobility and decrease likelihood of contractures. Pt additionally educated regarding elevation to reduce swelling. Patient will benefit from continued inpatient follow up therapy, <3 hours/day.     Recommendations for follow up therapy are one component of a multi-disciplinary discharge planning process, led by the attending physician.  Recommendations may be updated based on patient status, additional functional criteria and insurance authorization.   Assistance Recommended at Discharge Intermittent Supervision/Assistance  Patient can return home with the following A lot of help with walking and/or transfers;A lot of help with bathing/dressing/bathroom;Assistance with cooking/housework;Assist for transportation;Help with stairs or ramp for entrance    Functional Status Assessment  Patient has had a recent decline in their functional status and demonstrates the ability to make significant improvements in function in a reasonable and predictable amount of time.  Equipment Recommendations  None recommended by OT    Recommendations for Other Services       Precautions / Restrictions Precautions Precautions: Fall Restrictions Weight Bearing Restrictions: Yes RUE Weight Bearing:  (not stated in chart - treat as WBAT Elbow only due to finger amputations until further clarified 7/12)      Mobility Bed  Mobility               General bed mobility comments: OOB in chair upon arrival and departure    Transfers Overall transfer level: Needs assistance Equipment used: Right platform walker Transfers: Sit to/from Stand Sit to Stand: Mod assist           General transfer comment: Increased time to rise ; montiored to not push through R hand      Balance Overall balance assessment: Needs assistance Sitting-balance support: Feet supported Sitting balance-Leahy Scale: Good     Standing balance support: Bilateral upper extremity supported, During functional activity, Reliant on assistive device for balance Standing balance-Leahy Scale: Poor Standing balance comment: reliant on RW support                           ADL either performed or assessed with clinical judgement   ADL Overall ADL's : Needs assistance/impaired Eating/Feeding: Minimal assistance;Sitting   Grooming: Sitting;Minimal assistance Grooming Details (indicate cue type and reason): for bil UE tasks Upper Body Bathing: Minimal assistance;Sitting   Lower Body Bathing: Sitting/lateral leans;Moderate assistance   Upper Body Dressing : Minimal assistance;Sitting Upper Body Dressing Details (indicate cue type and reason): educated regarding compensatory techniques Lower Body Dressing: Maximal assistance;Sit to/from stand   Toilet Transfer: Minimal assistance;Ambulation;Regular Toilet;Rolling walker (2 wheels) Toilet Transfer Details (indicate cue type and reason): plaform walker         Functional mobility during ADLs: Minimal assistance;Rolling walker (2 wheels) General ADL Comments: Min A to advance RW     Vision Baseline Vision/History: 1 Wears glasses Ability to See in Adequate Light: 1 Impaired Patient Visual Report: No change  from baseline Additional Comments: L lower quadrant peripheral visual field loss per wife at baseline. No new changes. Able to read board and locate items during  session     Perception     Praxis      Pertinent Vitals/Pain Pain Assessment Pain Assessment: Faces Faces Pain Scale: Hurts even more Pain Location: R palm with joing ROM Pain Descriptors / Indicators: Discomfort, Grimacing, Guarding Pain Intervention(s): Limited activity within patient's tolerance, Monitored during session     Hand Dominance Right   Extremity/Trunk Assessment Upper Extremity Assessment Upper Extremity Assessment: RUE deficits/detail;LUE deficits/detail RUE Deficits / Details: Pt able to giggle fingers but manual facilitation required for more than ~5-10 degrees range. Pt able to tolerate WFL at PIP joints of 1st 3rd and 5th digits, but ~20 degrees at MCP. pt tolerating very limited range at MCP of amputated digits. Ablt to perform shoulder FF ~100 degrees due to weakness. LUE Deficits / Details: Able to make full fist, shoulder flex about 90-95 degrees at baseline per pt report, can touch face, 3-/5 MMT shldr flex   Lower Extremity Assessment Lower Extremity Assessment: Defer to PT evaluation   Cervical / Trunk Assessment Cervical / Trunk Assessment: Kyphotic   Communication Communication Communication: Expressive difficulties (increased time)   Cognition Arousal/Alertness: Awake/alert Behavior During Therapy: Flat affect Overall Cognitive Status: History of cognitive impairments - at baseline                                 General Comments: pt with slowed responses and requiring incresed time to follow commands. Cues for delayed memory recall.     General Comments  VSS    Exercises Exercises: Other exercises Other Exercises Other Exercises: R hand MCP and PIP ROm to digits 1, 3, 5. Other Exercises: RUE pronation, supination, wrist flexion/extension Other Exercises: education regarding hand positioning at rest to reduce edema as well as excercises to perform to decr likelihood of contracture/tightness   Shoulder Instructions       Home Living Family/patient expects to be discharged to:: Skilled nursing facility Living Arrangements: Spouse/significant other Available Help at Discharge: Family;Available PRN/intermittently (wife works 11 am-4 pm , 4 days per week) Type of Home: Apartment Home Access: Level entry     Home Layout: One level     Bathroom Shower/Tub: Chief Strategy Officer: Standard Bathroom Accessibility: Yes   Home Equipment: Grab bars - tub/shower;Grab bars - toilet;Rolling Walker (2 wheels);Rollator (4 wheels);Cane - single point;Tub bench;Hand held shower head   Additional Comments: Pt was rehabbing at [facility] from prior hospitalization with plan to return      Prior Functioning/Environment Prior Level of Function : Needs assist             Mobility Comments: Ambulating 90-100' with PT at SNF using RW ADLs Comments: requires assist for ADLs, working with OT        OT Problem List: Impaired UE functional use;Impaired balance (sitting and/or standing);Decreased activity tolerance;Decreased strength;Pain      OT Treatment/Interventions: Self-care/ADL training;Splinting;Therapeutic exercise;Therapeutic activities;Patient/family education;Balance training;DME and/or AE instruction    OT Goals(Current goals can be found in the care plan section) Acute Rehab OT Goals Patient Stated Goal: go back to rehab OT Goal Formulation: With patient Time For Goal Achievement: 07/10/22 Potential to Achieve Goals: Good  OT Frequency: Min 3X/week    Co-evaluation  AM-PAC OT "6 Clicks" Daily Activity     Outcome Measure Help from another person eating meals?: A Little Help from another person taking care of personal grooming?: A Little Help from another person toileting, which includes using toliet, bedpan, or urinal?: A Lot Help from another person bathing (including washing, rinsing, drying)?: A Lot Help from another person to put on and taking off regular  upper body clothing?: A Little Help from another person to put on and taking off regular lower body clothing?: A Lot 6 Click Score: 15   End of Session Equipment Utilized During Treatment: Other (comment) (R platform walker) Nurse Communication: Mobility status  Activity Tolerance: Patient tolerated treatment well Patient left: in chair;with call bell/phone within reach;with family/visitor present  OT Visit Diagnosis: Unsteadiness on feet (R26.81);Other abnormalities of gait and mobility (R26.89);History of falling (Z91.81);Muscle weakness (generalized) (M62.81);Pain Pain - Right/Left: Right Pain - part of body: Hand                Time: 1610-9604 OT Time Calculation (min): 18 min Charges:  OT General Charges $OT Visit: 1 Visit OT Evaluation $OT Eval Moderate Complexity: 1 Mod OT Treatments $Self Care/Home Management : 8-22 mins  Tyler Deis, OTR/L [facility] Acute Rehabilitation Office: [phone]   Myrla Halsted 07/18/2022, 12:59 PM

## 2022-07-18 NOTE — Evaluation (Addendum)
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  Pt with hx including but not limited to ESRD on HD TTS, CAD, hypothyroidism, HTN, DM, Hx TBI  Clinical Impression  Pt admitted with above diagnosis. Pt recently at [facility] for therapy.  While there he was working with therapy and family reports ambulating around 53' with RW and assist.  His plan is to return to SNF.  Today, precautions for R UE not available - OT/RN messaged surgeon to clarify.  Treated as WBAT elbow only with platform to ambulate until further clarified.  Required mod A to stand and min A to ambulate 2' with platform.  Pt currently with functional limitations due to the deficits listed below (see PT Problem List). Pt will benefit from acute skilled PT to increase their independence and safety with mobility to allow discharge.  Patient will benefit from continued inpatient follow up therapy, <3 hours/day          Assistance Recommended at Discharge Frequent or constant Supervision/Assistance  If plan is discharge home, recommend the following:  Can travel by private vehicle  A lot of help with walking and/or transfers;A lot of help with bathing/dressing/bathroom;Assistance with cooking/housework;Assistance with feeding;Direct supervision/assist for medications management;Direct supervision/assist for financial management;Assist for transportation;Help with stairs or ramp for entrance   Yes    Equipment Recommendations Other (comment) (possible platform RW but need to confirm restricitions with surgeon)  Recommendations for Other Services       Functional Status Assessment Patient has had a recent decline in their functional status and demonstrates the ability to make significant improvements in function in  a reasonable and predictable amount of time.     Precautions / Restrictions Precautions Precautions: Fall Restrictions Weight Bearing Restrictions: Yes RUE Weight Bearing:  (not stated in chair - treat as WBAT Elbow only due to finger amputations until further clarified 7/12)      Mobility  Bed Mobility               General bed mobility comments: OOB in chair upon arrival and departure    Transfers Overall transfer level: Needs assistance Equipment used: Right platform walker Transfers: Sit to/from Stand Sit to Stand: Mod assist           General transfer comment: Increased time to rise ; montiored to not push through R hand    Ambulation/Gait Ambulation/Gait assistance: Min assist Gait Distance (Feet): 40 Feet Assistive device: Right platform walker Gait Pattern/deviations: Step-to pattern, Shuffle, Decreased stride length Gait velocity: slowed     General Gait Details: Slowed gait with cues for RW managment; required min A for balance and to advance/turn platform RW  Stairs            Wheelchair Mobility     Tilt Bed    Modified Rankin (Stroke Patients Only)       Balance Overall balance assessment: Needs assistance Sitting-balance support: Feet supported Sitting balance-Leahy Scale: Good     Standing balance support: Bilateral upper extremity supported, During functional activity, Reliant on assistive device for balance Standing balance-Leahy Scale: Poor Standing balance comment: reliant on RW support  Pertinent Vitals/Pain Pain Assessment Pain Assessment: Faces Faces Pain Scale: Hurts little more Pain Location: R palm Pain Descriptors / Indicators: Discomfort, Grimacing, Guarding Pain Intervention(s): Limited activity within patient's tolerance, Monitored during session    Home Living Family/patient expects to be discharged to:: Skilled nursing facility Living Arrangements:  Spouse/significant other Available Help at Discharge: Family;Available PRN/intermittently (wife works 11 am-4 pm , 4 days per week) Type of Home: Apartment Home Access: Level entry       Home Layout: One level Home Equipment: Grab bars - tub/shower;Grab bars - toilet;Rolling Walker (2 wheels);Rollator (4 wheels);Cane - single point;Tub bench;Hand held shower head Additional Comments: Pt was rehabbing at [facility] from prior hospitalization with plan to return    Prior Function Prior Level of Function : Needs assist             Mobility Comments: Ambulating 90-100' with PT at SNF using RW ADLs Comments: requires assist for ADLs, working with OT     Hand Dominance        Extremity/Trunk Assessment   Upper Extremity Assessment Upper Extremity Assessment: Defer to OT evaluation    Lower Extremity Assessment Lower Extremity Assessment: Generalized weakness    Cervical / Trunk Assessment Cervical / Trunk Assessment: Kyphotic  Communication   Communication: Expressive difficulties (required increased time)  Cognition Arousal/Alertness: Awake/alert Behavior During Therapy: Flat affect Overall Cognitive Status: History of cognitive impairments - at baseline                                 General Comments: pt with slowed responses        General Comments General comments (skin integrity, edema, etc.): VSS; OT and RN sent message to clarify R hand restricitions    Exercises     Assessment/Plan    PT Assessment Patient needs continued PT services  PT Problem List Decreased strength;Decreased range of motion;Decreased activity tolerance;Decreased balance;Decreased mobility;Decreased coordination;Decreased cognition;Cardiopulmonary status limiting activity;Impaired sensation;Decreased skin integrity;Pain;Decreased safety awareness;Decreased knowledge of precautions;Decreased knowledge of use of DME       PT Treatment Interventions DME instruction;Gait  training;Functional mobility training;Therapeutic activities;Therapeutic exercise;Balance training;Cognitive remediation;Patient/family education;Neuromuscular re-education    PT Goals (Current goals can be found in the Care Plan section)  Acute Rehab PT Goals Patient Stated Goal: return to rehab PT Goal Formulation: With patient/family Time For Goal Achievement: 08/01/22 Potential to Achieve Goals: Good    Frequency Min 2X/week     Co-evaluation               AM-PAC PT "6 Clicks" Mobility  Outcome Measure Help needed turning from your back to your side while in a flat bed without using bedrails?: A Little Help needed moving from lying on your back to sitting on the side of a flat bed without using bedrails?: A Lot Help needed moving to and from a bed to a chair (including a wheelchair)?: A Lot Help needed standing up from a chair using your arms (e.g., wheelchair or bedside chair)?: A Lot Help needed to walk in hospital room?: A Little Help needed climbing 3-5 steps with a railing? : Total 6 Click Score: 13    End of Session Equipment Utilized During Treatment: Gait belt Activity Tolerance: Patient tolerated treatment well Patient left: in chair;with call bell/phone within reach;with family/visitor present Nurse Communication: Mobility status PT Visit Diagnosis: Other abnormalities of gait and mobility (R26.89);Unsteadiness on feet (R26.81)    Time:  5621-3086 PT Time Calculation (min) (ACUTE ONLY): 30 min (dovetail with OT and increased time finding platform)   Charges:   PT Evaluation $PT Eval Moderate Complexity: 1 Mod   PT General Charges $$ ACUTE PT VISIT: 1 Visit         Anise Salvo, PT Acute Rehab [facility] Rehab [phone]   Rayetta Humphrey 07/18/2022, 11:46 AM

## 2022-07-19 LAB — CULTURE, BLOOD (ROUTINE X 2): Culture: NO GROWTH

## 2022-07-19 NOTE — Anesthesia Postprocedure Evaluation (Signed)
Anesthesia Post Note  Patient: Chase [name]  Procedure(s) Performed: AMPUTATION OF RIGHT RING FINGER AND PARTIAL AMPUTATION OF RIGHT INDEX FINGER (Right: Hand)     Patient location during evaluation: PACU Anesthesia Type: MAC Level of consciousness: awake and alert Pain management: pain level controlled Vital Signs Assessment: post-procedure vital signs reviewed and stable Respiratory status: spontaneous breathing, nonlabored ventilation and respiratory function stable Cardiovascular status: blood pressure returned to baseline Postop Assessment: no apparent nausea or vomiting Anesthetic complications: no   No notable events documented.        Shanda Howells

## 2022-07-20 LAB — CULTURE, BLOOD (ROUTINE X 2): Special Requests: ADEQUATE

## 2022-07-21 LAB — SURGICAL PATHOLOGY

## 2022-07-21 LAB — CULTURE, BLOOD (ROUTINE X 2)

## 2022-07-22 LAB — CULTURE, BLOOD (ROUTINE X 2)

## 2022-07-25 ENCOUNTER — Ambulatory Visit (HOSPITAL_COMMUNITY): Payer: No Typology Code available for payment source

## 2022-07-28 ENCOUNTER — Ambulatory Visit (INDEPENDENT_AMBULATORY_CARE_PROVIDER_SITE_OTHER): Payer: Medicare Other | Admitting: Physician Assistant

## 2022-07-28 ENCOUNTER — Ambulatory Visit (INDEPENDENT_AMBULATORY_CARE_PROVIDER_SITE_OTHER)
Admission: RE | Admit: 2022-07-28 | Discharge: 2022-07-28 | Disposition: A | Discharge status: Home / Self Care | Payer: Medicare Other | Source: Ambulatory Visit | Attending: Physician Assistant | Admitting: Physician Assistant

## 2022-07-28 VITALS — BP 178/80 | HR 61 | Temp 97.7°F | Resp 16 | Ht 66.0 in | Wt 200.0 lb

## 2022-07-28 DIAGNOSIS — R569 Unspecified convulsions: Secondary | ICD-10-CM | POA: Diagnosis not present

## 2022-07-28 DIAGNOSIS — M79601 Pain in right arm: Secondary | ICD-10-CM | POA: Insufficient documentation

## 2022-07-28 DIAGNOSIS — I6329 Cerebral infarction due to unspecified occlusion or stenosis of other precerebral arteries: Secondary | ICD-10-CM | POA: Diagnosis not present

## 2022-07-28 DIAGNOSIS — I70229 Atherosclerosis of native arteries of extremities with rest pain, unspecified extremity: Secondary | ICD-10-CM | POA: Diagnosis not present

## 2022-07-28 DIAGNOSIS — Z9889 Other specified postprocedural states: Secondary | ICD-10-CM | POA: Diagnosis not present

## 2022-07-28 NOTE — Progress Notes (Signed)
Office Note     CC:  follow up Requesting Provider:  Randel Pigg, Dorma Russell, MD  HPI: Chase [name] is a 72 y.o. (Jun 01, 1950) male who presents for hospital follow up. He recently underwent arch aortogram from transfemoral approach with right upper extremity arteriogram with right radial artery angioplasty on 06/25/22 by Dr. Chestine Spore. 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 He was noted to have dominant radial runoff into the right hand with occlusion of the ulnar artery at the wrist. He did well post intervention with adequate perfusion of the right hand. He subsequently underwent Right ring and index finger amputations by Dr. Yehuda Budd with Orthopedics on 07/17/22.   He also had removal of his PD catheter while in the hospital on 06/30/22 by Dr. Karin Lieu.   Today he is here with his wife. He currently is residing at [facility]. He reports overall he is okay. He is having soreness in are of his right and index finger amputation sites. Feels redness is improving some. Has seen Orthopedics since discharge. Patient and wife unsure of exact f/u date with them. He otherwise denies any pain in the forearm or hand. [facility] at the [facility].  He denies any pain in his abdomen following PD catheter removal. Incisions are healing well he feels. He is medically managed on Aspirin, Plavix and Statin  He currently dialyzes via left radiocephalic AV fistula on TTS   Past Medical History:  Diagnosis Date   Anemia    Back pain with radiation    Coronary artery disease    Depression    Diabetes mellitus without complication (HCC)    Dialysis patient (HCC)    M, W, F   History of traumatic head injury    Hypertension    Renal disorder    Thyroid disease     Past Surgical History:  Procedure Laterality Date   AMPUTATION Right 07/17/2022   Procedure: AMPUTATION OF RIGHT RING FINGER AND PARTIAL AMPUTATION OF RIGHT INDEX FINGER;  Surgeon:  Gomez Cleverly, MD;  Location: [facility] OR;  Service: Orthopedics;  Laterality: Right;   CAPD REMOVAL N/A 06/30/2022   Procedure: REMOVAL CONTINUOUS AMBULATORY PERITONEAL DIALYSIS  (CAPD) CATHETER;  Surgeon: Victorino Sparrow, MD;  Location: [facility] OR;  Service: Vascular;  Laterality: N/A;   ERCP     PERIPHERAL VASCULAR BALLOON ANGIOPLASTY Right 06/25/2022   Procedure: PERIPHERAL VASCULAR BALLOON ANGIOPLASTY;  Surgeon: Cephus Shelling, MD;  Location: [facility] INVASIVE CV LAB;  Service: Cardiovascular;  Laterality: Right;  Radial   UPPER EXTREMITY ANGIOGRAPHY N/A 06/25/2022   Procedure: Upper Extremity Angiography;  Surgeon: Cephus Shelling, MD;  Location: [facility] INVASIVE CV LAB;  Service: Cardiovascular;  Laterality: N/A;    Social History   Socioeconomic History   Marital status: Married    Spouse name: Not on file   Number of children: Not on file   Years of education: Not on file   Highest education level: Not on file  Occupational History   Not on file  Tobacco Use   Smoking status: Never   Smokeless tobacco: Never  Substance and Sexual Activity   Alcohol use: Never   Drug use: Never   Sexual activity: Yes  Other Topics Concern   Not on file  Social History Narrative   Not on file   Social Determinants of Health   Financial Resource Strain: Low Risk  (09/16/2021)   [facility]   Overall Financial Resource Strain (CARDIA)    Difficulty of Paying Living Expenses: Not very hard  Food Insecurity: No Food Insecurity (07/16/2022)   Hunger Vital Sign    Worried About Running Out of Food in the Last Year: Never true    Ran Out of Food in the Last Year: Never true  Transportation Needs: No Transportation Needs (07/16/2022)   PRAPARE - Administrator, Civil Service (Medical): No    Lack of Transportation (Non-Medical): No  Physical Activity: Inactive (09/16/2021)   [facility], [facility]   Exercise Vital Sign    Days of Exercise  per Week: 0 days    Minutes of Exercise per Session: 10 min  Stress: Patient Declined (09/16/2021)   Received from [facility], [facility] - Occupational Stress Questionnaire    Feeling of Stress : Patient declined  Social Connections: Socially Integrated (09/16/2021)   Received from [facility], [facility]   Social Network    How would you rate your social network (family, work, friends)?: Good participation with social networks  Intimate Partner Violence: Not At Risk (07/16/2022)   Humiliation, Afraid, Rape, and Kick questionnaire    Fear of Current or Ex-Partner: No    Emotionally Abused: No    Physically Abused: No    Sexually Abused: No   No family history on file.  Current Outpatient Medications  Medication Sig Dispense Refill   acetaminophen (TYLENOL) 500 MG tablet Take 1,000 mg by mouth every 8 (eight) hours as needed for moderate pain or mild pain.     albuterol (VENTOLIN HFA) 108 (90 Base) MCG/ACT inhaler Inhale 2 puffs into the lungs every 4 (four) hours as needed for wheezing or shortness of breath.     aspirin EC 81 MG tablet Take 1 tablet (81 mg total) by mouth daily. Swallow whole. 30 tablet 12   buprenorphine (SUBUTEX) 2 MG SUBL SL tablet Place 1 tablet (2 mg total) under the tongue at bedtime. 12 tablet 0   busPIRone (BUSPAR) 10 MG tablet Take 1-2 tablets (10-20 mg total) by mouth See admin instructions. Take 2 tablets in the AM and 1 tablet in the PM. (Patient taking differently: Take 10-20 mg by mouth See admin instructions. Take 2 tablets by mouth in the morning and 1 tablet in the at bedtime) 10 tablet 0   calcitRIOL (ROCALTROL) 0.25 MCG capsule Take 0.25 mcg by mouth daily.     Calcium Acetate 667 MG TABS Take 1 tablet by mouth 3 (three) times daily.     clopidogrel (PLAVIX) 75 MG tablet Take 1 tablet (75 mg total) by mouth daily. 30 tablet 1   Darbepoetin Alfa (ARANESP) 100 MCG/0.5ML SOSY injection Inject 0.5 mLs  (100 mcg total) into the skin every Thursday at 6pm. 4.2 mL    escitalopram (LEXAPRO) 20 MG tablet Take 20 mg by mouth daily.     levothyroxine (SYNTHROID) 125 MCG tablet Take 250 mcg by mouth daily before breakfast.     lidocaine (LMX) 4 % cream Apply 1 Application topically once a week. Apply to fistula  prior to Dialysis- Tue, Thurs, Sat     melatonin 3 MG TABS tablet Take 3 mg by mouth at bedtime.     midodrine (PROAMATINE) 10 MG tablet Take 1 tablet (10 mg total) by mouth Every Tuesday,Thursday,and Saturday with dialysis. (Patient taking differently: Take 10 mg by mouth Every Tuesday,Thursday,and Saturday with  dialysis. May also give 1 tablet for sBP less than 90) 12 tablet 1   oxyCODONE (OXY IR/ROXICODONE) 5 MG immediate release tablet Take 1 tablet (5 mg total) by mouth every 4 (four) hours as needed for moderate pain. 12 tablet 0   pantoprazole (PROTONIX) 40 MG tablet Take 40 mg by mouth 2 (two) times daily. For GERD     rosuvastatin (CRESTOR) 10 MG tablet Take 10 mg by mouth at bedtime.     No current facility-administered medications for this visit.    Allergies  Allergen Reactions   Gabapentin     Cognitive impairment per spouse   Lipitor [Atorvastatin]     Severe muscle cramps per spouse   Nsaids Other (See Comments)    Severe renal impairment per [employer]   Pregabalin     Cognitive impairment. Dizziness.     REVIEW OF SYSTEMS:   [X]  denotes positive finding, [ ]  denotes negative finding Cardiac  Comments:  Chest pain or chest pressure:    Shortness of breath upon exertion:    Short of breath when lying flat:    Irregular heart rhythm:        Vascular    Pain in calf, thigh, or hip brought on by ambulation:    Pain in feet at night that wakes you up from your sleep:     Blood clot in your veins:    Leg swelling:         Pulmonary    Oxygen at home:    Productive cough:     Wheezing:         Neurologic    Sudden weakness in arms or legs:     Sudden numbness in  arms or legs:     Sudden onset of difficulty speaking or slurred speech:    Temporary loss of vision in one eye:     Problems with dizziness:         Gastrointestinal    Blood in stool:     Vomited blood:         Genitourinary    Burning when urinating:     Blood in urine:        Psychiatric    Major depression:         Hematologic    Bleeding problems:    Problems with blood clotting too easily:        Skin    Rashes or ulcers:        Constitutional    Fever or chills:      PHYSICAL EXAMINATION:  There were no vitals filed for this visit.  General:  WDWN in NAD; vital signs documented above Gait: Not observed, in wheel chair HENT: WNL, normocephalic Pulmonary: normal non-labored breathing, without wheezing Cardiac: regular HR Abdomen: soft, incisions are healing well. Dermabond still in place Vascular Exam/Pulses: 2+right brachial, 1+ right radial. Doppler Radial, ulnar and palmar signals.  Extremities: without ischemic changes, without Gangrene , without cellulitis; without open wounds; right index and ring finger amputations healing, erythematous. No drainage. Dry eschars along suture line, tender to palpation Musculoskeletal: no muscle wasting or atrophy  Neurologic: A&O X 3 Psychiatric:  The pt has Normal affect.   Non-Invasive Vascular Imaging:   Right Doppler Findings:  +--------------+----------+----------+--------+--------+  Site         PSV (cm/s)Waveform  StenosisComments  +--------------+----------+----------+--------+--------+  Subclavian Mid100       biphasic                    +--------------+----------+----------+--------+--------+  Axillary     101       biphasic                    +--------------+----------+----------+--------+--------+  Brachial Prox 128       triphasic                   +--------------+----------+----------+--------+--------+  Brachial Mid  124       triphasic                    +--------------+----------+----------+--------+--------+  Brachial Dist 110       triphasic                   +--------------+----------+----------+--------+--------+  Radial Prox   86        monophasic                  +--------------+----------+----------+--------+--------+  Radial Mid    34        monophasic                  +--------------+----------+----------+--------+--------+  Radial Dist   34        monophasic                  +--------------+----------+----------+--------+--------+  Ulnar Prox    73        biphasic                    +--------------+----------+----------+--------+--------+  Ulnar Mid     44        monophasic                  +--------------+----------+----------+--------+--------+  Ulnar Dist    27        monophasic                  +--------------+----------+----------+--------+--------+    Summary:    Right: No obstruction visualized in the right upper extremity.   ASSESSMENT/PLAN:: 72 y.o. male here for follow up for arch aortogram from transfemoral approach with right upper extremity arteriogram with right radial artery angioplasty on 06/25/22 by Dr. Chestine Spore. 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 He is now s/p amputation of the ring and index fingers by Dr. Yehuda Budd.  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

## 2022-07-29 ENCOUNTER — Emergency Department (HOSPITAL_COMMUNITY): Payer: Medicare Other

## 2022-07-29 ENCOUNTER — Inpatient Hospital Stay (HOSPITAL_COMMUNITY)
Admission: EM | Admit: 2022-07-29 | Discharge: 2022-08-11 | DRG: 064 | Disposition: A | Discharge status: Hospice | Payer: Medicare Other | Attending: Internal Medicine | Admitting: Internal Medicine

## 2022-07-29 ENCOUNTER — Other Ambulatory Visit: Payer: Self-pay

## 2022-07-29 ENCOUNTER — Encounter (HOSPITAL_COMMUNITY): Payer: Self-pay | Admitting: Emergency Medicine

## 2022-07-29 DIAGNOSIS — F32A Depression, unspecified: Secondary | ICD-10-CM | POA: Diagnosis present

## 2022-07-29 DIAGNOSIS — N2581 Secondary hyperparathyroidism of renal origin: Secondary | ICD-10-CM | POA: Diagnosis present

## 2022-07-29 DIAGNOSIS — M898X9 Other specified disorders of bone, unspecified site: Secondary | ICD-10-CM | POA: Diagnosis present

## 2022-07-29 DIAGNOSIS — Z9049 Acquired absence of other specified parts of digestive tract: Secondary | ICD-10-CM

## 2022-07-29 DIAGNOSIS — E1169 Type 2 diabetes mellitus with other specified complication: Secondary | ICD-10-CM

## 2022-07-29 DIAGNOSIS — Z79891 Long term (current) use of opiate analgesic: Secondary | ICD-10-CM

## 2022-07-29 DIAGNOSIS — I2510 Atherosclerotic heart disease of native coronary artery without angina pectoris: Secondary | ICD-10-CM | POA: Diagnosis present

## 2022-07-29 DIAGNOSIS — Z1152 Encounter for screening for COVID-19: Secondary | ICD-10-CM

## 2022-07-29 DIAGNOSIS — I6329 Cerebral infarction due to unspecified occlusion or stenosis of other precerebral arteries: Principal | ICD-10-CM | POA: Diagnosis present

## 2022-07-29 DIAGNOSIS — R109 Unspecified abdominal pain: Principal | ICD-10-CM

## 2022-07-29 DIAGNOSIS — G40909 Epilepsy, unspecified, not intractable, without status epilepticus: Secondary | ICD-10-CM | POA: Diagnosis present

## 2022-07-29 DIAGNOSIS — Z79899 Other long term (current) drug therapy: Secondary | ICD-10-CM

## 2022-07-29 DIAGNOSIS — E039 Hypothyroidism, unspecified: Secondary | ICD-10-CM | POA: Diagnosis present

## 2022-07-29 DIAGNOSIS — Z7982 Long term (current) use of aspirin: Secondary | ICD-10-CM

## 2022-07-29 DIAGNOSIS — G9340 Encephalopathy, unspecified: Secondary | ICD-10-CM | POA: Diagnosis present

## 2022-07-29 DIAGNOSIS — Z888 Allergy status to other drugs, medicaments and biological substances status: Secondary | ICD-10-CM

## 2022-07-29 DIAGNOSIS — I132 Hypertensive heart and chronic kidney disease with heart failure and with stage 5 chronic kidney disease, or end stage renal disease: Secondary | ICD-10-CM | POA: Diagnosis present

## 2022-07-29 DIAGNOSIS — D62 Acute posthemorrhagic anemia: Secondary | ICD-10-CM | POA: Diagnosis not present

## 2022-07-29 DIAGNOSIS — Z992 Dependence on renal dialysis: Secondary | ICD-10-CM

## 2022-07-29 DIAGNOSIS — G8929 Other chronic pain: Secondary | ICD-10-CM | POA: Diagnosis present

## 2022-07-29 DIAGNOSIS — N186 End stage renal disease: Secondary | ICD-10-CM | POA: Diagnosis present

## 2022-07-29 DIAGNOSIS — Z6832 Body mass index (BMI) 32.0-32.9, adult: Secondary | ICD-10-CM

## 2022-07-29 DIAGNOSIS — Z886 Allergy status to analgesic agent status: Secondary | ICD-10-CM

## 2022-07-29 DIAGNOSIS — Z66 Do not resuscitate: Secondary | ICD-10-CM | POA: Diagnosis not present

## 2022-07-29 DIAGNOSIS — X58XXXA Exposure to other specified factors, initial encounter: Secondary | ICD-10-CM | POA: Diagnosis present

## 2022-07-29 DIAGNOSIS — F05 Delirium due to known physiological condition: Secondary | ICD-10-CM | POA: Diagnosis not present

## 2022-07-29 DIAGNOSIS — E11649 Type 2 diabetes mellitus with hypoglycemia without coma: Secondary | ICD-10-CM | POA: Diagnosis not present

## 2022-07-29 DIAGNOSIS — R569 Unspecified convulsions: Secondary | ICD-10-CM

## 2022-07-29 DIAGNOSIS — R410 Disorientation, unspecified: Secondary | ICD-10-CM

## 2022-07-29 DIAGNOSIS — K921 Melena: Secondary | ICD-10-CM | POA: Diagnosis not present

## 2022-07-29 DIAGNOSIS — Z89021 Acquired absence of right finger(s): Secondary | ICD-10-CM

## 2022-07-29 DIAGNOSIS — Z9682 Presence of neurostimulator: Secondary | ICD-10-CM

## 2022-07-29 DIAGNOSIS — I96 Gangrene, not elsewhere classified: Secondary | ICD-10-CM | POA: Diagnosis present

## 2022-07-29 DIAGNOSIS — K219 Gastro-esophageal reflux disease without esophagitis: Secondary | ICD-10-CM | POA: Diagnosis present

## 2022-07-29 DIAGNOSIS — Z79890 Hormone replacement therapy (postmenopausal): Secondary | ICD-10-CM

## 2022-07-29 DIAGNOSIS — I5032 Chronic diastolic (congestive) heart failure: Secondary | ICD-10-CM | POA: Diagnosis present

## 2022-07-29 DIAGNOSIS — Z515 Encounter for palliative care: Secondary | ICD-10-CM

## 2022-07-29 DIAGNOSIS — G928 Other toxic encephalopathy: Secondary | ICD-10-CM | POA: Diagnosis present

## 2022-07-29 DIAGNOSIS — K8050 Calculus of bile duct without cholangitis or cholecystitis without obstruction: Secondary | ICD-10-CM | POA: Diagnosis present

## 2022-07-29 DIAGNOSIS — Z794 Long term (current) use of insulin: Secondary | ICD-10-CM

## 2022-07-29 DIAGNOSIS — E785 Hyperlipidemia, unspecified: Secondary | ICD-10-CM | POA: Diagnosis present

## 2022-07-29 DIAGNOSIS — S301XXA Contusion of abdominal wall, initial encounter: Secondary | ICD-10-CM | POA: Diagnosis present

## 2022-07-29 DIAGNOSIS — D631 Anemia in chronic kidney disease: Secondary | ICD-10-CM | POA: Diagnosis present

## 2022-07-29 DIAGNOSIS — I358 Other nonrheumatic aortic valve disorders: Secondary | ICD-10-CM | POA: Diagnosis present

## 2022-07-29 DIAGNOSIS — M549 Dorsalgia, unspecified: Secondary | ICD-10-CM | POA: Diagnosis present

## 2022-07-29 DIAGNOSIS — Z87820 Personal history of traumatic brain injury: Secondary | ICD-10-CM

## 2022-07-29 DIAGNOSIS — G9341 Metabolic encephalopathy: Secondary | ICD-10-CM | POA: Diagnosis present

## 2022-07-29 DIAGNOSIS — Z781 Physical restraint status: Secondary | ICD-10-CM

## 2022-07-29 DIAGNOSIS — Z7902 Long term (current) use of antithrombotics/antiplatelets: Secondary | ICD-10-CM

## 2022-07-29 DIAGNOSIS — R627 Adult failure to thrive: Secondary | ICD-10-CM | POA: Diagnosis present

## 2022-07-29 DIAGNOSIS — E119 Type 2 diabetes mellitus without complications: Secondary | ICD-10-CM

## 2022-07-29 DIAGNOSIS — F0390 Unspecified dementia without behavioral disturbance: Secondary | ICD-10-CM | POA: Diagnosis present

## 2022-07-29 DIAGNOSIS — E1151 Type 2 diabetes mellitus with diabetic peripheral angiopathy without gangrene: Secondary | ICD-10-CM | POA: Diagnosis present

## 2022-07-29 DIAGNOSIS — F419 Anxiety disorder, unspecified: Secondary | ICD-10-CM | POA: Diagnosis present

## 2022-07-29 DIAGNOSIS — E1122 Type 2 diabetes mellitus with diabetic chronic kidney disease: Secondary | ICD-10-CM | POA: Diagnosis present

## 2022-07-29 LAB — CBC WITH DIFFERENTIAL/PLATELET
Abs Immature Granulocytes: 0.03 10*3/uL (ref 0.00–0.07)
Basophils Absolute: 0.1 10*3/uL (ref 0.0–0.1)
Basophils Relative: 1 %
Eosinophils Absolute: 0.2 10*3/uL (ref 0.0–0.5)
Eosinophils Relative: 3 %
HCT: 35.2 % — ABNORMAL LOW (ref 39.0–52.0)
Hemoglobin: 10.7 g/dL — ABNORMAL LOW (ref 13.0–17.0)
Immature Granulocytes: 0 %
Lymphocytes Relative: 14 %
Lymphs Abs: 1 10*3/uL (ref 0.7–4.0)
MCH: 30 pg (ref 26.0–34.0)
MCHC: 30.4 g/dL (ref 30.0–36.0)
MCV: 98.6 fL (ref 80.0–100.0)
Monocytes Absolute: 0.5 10*3/uL (ref 0.1–1.0)
Monocytes Relative: 6 %
Neutro Abs: 5.6 10*3/uL (ref 1.7–7.7)
Neutrophils Relative %: 76 %
Platelets: 183 10*3/uL (ref 150–400)
RBC: 3.57 MIL/uL — ABNORMAL LOW (ref 4.22–5.81)
RDW: 15.1 % (ref 11.5–15.5)
WBC: 7.4 10*3/uL (ref 4.0–10.5)
nRBC: 0 % (ref 0.0–0.2)

## 2022-07-29 LAB — COMPREHENSIVE METABOLIC PANEL
ALT: 7 U/L (ref 0–44)
AST: 13 U/L — ABNORMAL LOW (ref 15–41)
Albumin: 2.9 g/dL — ABNORMAL LOW (ref 3.5–5.0)
Alkaline Phosphatase: 116 U/L (ref 38–126)
Anion gap: 14 (ref 5–15)
BUN: 40 mg/dL — ABNORMAL HIGH (ref 8–23)
CO2: 24 mmol/L (ref 22–32)
Calcium: 9.4 mg/dL (ref 8.9–10.3)
Chloride: 98 mmol/L (ref 98–111)
Creatinine, Ser: 5.01 mg/dL — ABNORMAL HIGH (ref 0.61–1.24)
GFR, Estimated: 12 mL/min — ABNORMAL LOW (ref >60)
Glucose, Bld: 87 mg/dL (ref 70–99)
Potassium: 4.6 mmol/L (ref 3.5–5.1)
Sodium: 136 mmol/L (ref 135–145)
Total Bilirubin: 0.8 mg/dL (ref 0.3–1.2)
Total Protein: 6.9 g/dL (ref 6.5–8.1)

## 2022-07-29 LAB — TROPONIN I (HIGH SENSITIVITY)
Troponin I (High Sensitivity): 17 ng/L (ref <18)
Troponin I (High Sensitivity): 19 ng/L — ABNORMAL HIGH (ref <18)

## 2022-07-29 LAB — LIPASE, BLOOD: Lipase: 26 U/L (ref 11–51)

## 2022-07-29 LAB — AMMONIA: Ammonia: 20 umol/L (ref 9–35)

## 2022-07-29 MED ORDER — IOHEXOL 350 MG/ML SOLN
75.0000 mL | Freq: Once | INTRAVENOUS | Status: AC | PRN
  Administered 2022-07-29: 75 mL via INTRAVENOUS

## 2022-07-29 NOTE — H&P (Signed)
Sayid Moll ZOX:[id number] DOB: 11/15/1950 DOA: 07/29/2022     PCP: [name], MD   Outpatient Specialists: * NONE CARDS: * Dr. None  NEphrology: *  Dr. NEurology *   Dr. Pulmonary *  Dr.  Oncology * Dr. Sandria Manly* Dr.  Deboraha Sprang, [facility]) No care team member to display Urology Dr. *  Patient arrived to ER on 07/29/22 at 1417 Referred by Attending Rondel Baton, MD   Patient coming from:    From facility [facility]  Chief Complaint:   Chief Complaint  Patient presents with   Abdominal Pain    HPI: Dimitrios Balestrieri is a 72 y.o. male with medical history significant of  ESRD on HD TTS, CAD, hypothyroidism, hypertension, anemia of chronic disease, depression/anxiety, GERD, type 2 diabetes, chronic back and neck pain.  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    Presented with    abdominal pain and confusion Was brought in from dialysis today complaining abdominal pain could not finish his treatment He has been more confused Still make small amount of urine no associated nausea vomiting no fever Patient with known history of end-stage renal disease on hemodialysis Tuesday Thursday Saturday also has known history of dry gangrene of his fingers Recently transition from PD to HD Significant anemia requiring blood transfusions Recently underwent arch aortogram from transfemoral approach by vascular surgery had right radial artery angioplasty done on 19 June by Dr. 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 now status post right ring and index finger amputation on 11 July Of note patient has spinal stimulator in place   Unable to get MRi   Denies significant ETOH intake   Does not smoke   Lab Results  Component Value Date   SARSCOV2NAA NEGATIVE 12/15/2021   SARSCOV2NAA NEGATIVE 10/25/2020   SARSCOV2NAA NEGATIVE 10/11/2020        Regarding pertinent Chronic problems:    Hyperlipidemia -  on statins Crestor Lipid Panel      Component Value Date/Time   CHOL 85 06/26/2022 0838   TRIG 96 06/26/2022 0838   HDL 27 (L) 06/26/2022 0838   CHOLHDL 3.1 06/26/2022 0838   VLDL 19 06/26/2022 0838   LDLCALC 39 06/26/2022 0838    Soft blood pressures now have been requiring some midodrine  ***chronic CHF diastolic/systolic/ combined - last echo*** No results found for this or any previous visit (from the past 81191 hour(s)).    CAD  - On Aspirin, statin,  Plavix                       DM 2 -  Lab Results  Component Value Date   HGBA1C 4.4 (L) 07/16/2022   diet controlled   Hypothyroidism:  Lab Results  Component Value Date   TSH 4.492 12/15/2021   on synthroid     *** COPD - not **followed by pulmonology *** not  on baseline oxygen  *L,    *** OSA -on nocturnal oxygen, *CPAP, *noncompliant with CPAP  *** Hx of CVA - *with/out residual deficits on Aspirin 81 mg, 325, Plavix   End-stage renal disease on hemodialysis Tuesday Thursday saturday Lab Results  Component Value Date   CREATININE 5.01 (H) 07/29/2022   CREATININE 3.60 (H) 07/18/2022   CREATININE 2.70 (H) 07/17/2022   Chronic anemia - baseline hg Hemoglobin & Hematocrit  Recent Labs    07/17/22 1535 07/18/22 0848 07/29/22 1438  HGB 11.9* 10.3*  10.7*   Iron/TIBC/Ferritin/ %Sat    Component Value Date/Time   IRON 19 (L) 06/25/2022 0259   TIBC 164 (L) 06/25/2022 0259   FERRITIN 793 (H) 06/25/2022 0259   IRONPCTSAT 12 (L) 06/25/2022 0259        While in ER: Clinical Course as of 07/29/22 2254  Tue Jul 29, 2022  2216 Dr Adela Glimpse [RP]  2242 Dr Signe Colt [RP]    Clinical Course User Index [RP] Rondel Baton, MD         Lab Orders         Comprehensive metabolic panel         Lipase, blood         CBC with Diff         Ammonia         Urinalysis, Routine w reflex microscopic -Urine, Clean Catch      CT HEAD   NON acute    CXR - ***NON acute  CTabd/pelvis -  Stable intra and extrahepatic biliary ductal dilatation  with hyperdense material in the common bile duct compatible with choledocholithiasis.  3. Stable nonobstructing right renal calculus. 4. Stable hyperdense lesion in the superior pole the right kidney, indeterminate. Recommend further evaluation with MRI or ultrasound. 5. Stable chronic small left pleural effusion with pleural thickening and rounded atelectasis. 6. Left lower abdominal wall intramuscular hematoma has decreased in size and now has the appearance of enhancing low-attenuation collections likely related to resolving hematomas.    Following Medications were ordered in ER: Medications  iohexol (OMNIPAQUE) 350 MG/ML injection 75 mL (75 mLs Intravenous Contrast Given 07/29/22 1741)    _______________________________________________________ ER Provider Called:     DrMarland Kitchen  They Recommend admit to medicine *** Will see in AM  ***SEEN in ER   ED Triage Vitals  Encounter Vitals Group     BP 07/29/22 1424 (!) 157/67     Systolic BP Percentile --      Diastolic BP Percentile --      Pulse Rate 07/29/22 1424 79     Resp 07/29/22 1424 18     Temp 07/29/22 1424 98.3 F (36.8 C)     Temp Source 07/29/22 1424 Oral     SpO2 07/29/22 1419 97 %     Weight 07/29/22 1424 200 lb (90.7 kg)     Height 07/29/22 1424 5\' 6"  (1.676 m)     Head Circumference --      Peak Flow --      Pain Score --      Pain Loc --      Pain Education --      Exclude from Growth Chart --   XLKG(40)@     _________________________________________ Significant initial  Findings: Abnormal Labs Reviewed  COMPREHENSIVE METABOLIC PANEL - Abnormal; Notable for the following components:      Result Value   BUN 40 (*)    Creatinine, Ser 5.01 (*)    Albumin 2.9 (*)    AST 13 (*)    GFR, Estimated 12 (*)    All other components within normal limits  CBC WITH DIFFERENTIAL/PLATELET - Abnormal; Notable for the following components:   RBC 3.57 (*)    Hemoglobin 10.7 (*)    HCT 35.2 (*)    All other components  within normal limits  TROPONIN I (HIGH SENSITIVITY) - Abnormal; Notable for the following components:   Troponin I (High Sensitivity) 19 (*)    All other components within normal limits  _________________________ Troponin  Cardiac Panel (last 3 results) Recent Labs    07/29/22 1438 07/29/22 1720  TROPONINIHS 17 19*     ECG: Ordered Personally reviewed and interpreted by me showing: HR : *** Rhythm: *NSR, Sinus tachycardia * A.fib. W RVR, RBBB, LBBB, Paced Ischemic changes*nonspecific changes, no evidence of ischemic changes QTC*  BNP (last 3 results) No results for input(s): "BNP" in the last 8760 hours.   COVID-19 Labs  No results for input(s): "DDIMER", "FERRITIN", "LDH", "CRP" in the last 72 hours.  Lab Results  Component Value Date   SARSCOV2NAA NEGATIVE 12/15/2021   SARSCOV2NAA NEGATIVE 10/25/2020   SARSCOV2NAA NEGATIVE 10/11/2020    The recent clinical data is shown below. Vitals:   07/29/22 1419 07/29/22 1424 07/29/22 1752  BP:  (!) 157/67 (!) 158/69  Pulse:  79 66  Resp:  18 16  Temp:  98.3 F (36.8 C) 98.6 F (37 C)  TempSrc:  Oral Oral  SpO2: 97% 93% 96%  Weight:  90.7 kg   Height:  5\' 6"  (1.676 m)    WBC     Component Value Date/Time   WBC 7.4 07/29/2022 1438   LYMPHSABS 1.0 07/29/2022 1438   MONOABS 0.5 07/29/2022 1438   EOSABS 0.2 07/29/2022 1438   BASOSABS 0.1 07/29/2022 1438   Lactic Acid, Venous    Component Value Date/Time   LATICACIDVEN 1.2 07/15/2022 1851      Lactic Acid, Venous    Component Value Date/Time   LATICACIDVEN 1.2 07/15/2022 1851    Procalcitonin *** Ordered      UA *** no evidence of UTI  ***Pending ***not ordered   Urine analysis:    Component Value Date/Time   COLORURINE STRAW (A) 06/27/2022 1351   APPEARANCEUR CLEAR 06/27/2022 1351   LABSPEC 1.006 06/27/2022 1351   PHURINE 9.0 (H) 06/27/2022 1351   GLUCOSEU 150 (A) 06/27/2022 1351   HGBUR LARGE (A) 06/27/2022 1351   BILIRUBINUR NEGATIVE  06/27/2022 1351   KETONESUR NEGATIVE 06/27/2022 1351   PROTEINUR 30 (A) 06/27/2022 1351   NITRITE NEGATIVE 06/27/2022 1351   LEUKOCYTESUR MODERATE (A) 06/27/2022 1351    Results for orders placed or performed during the hospital encounter of 07/15/22  Culture, blood (routine x 2)     Status: Abnormal   Collection Time: 07/15/22  6:47 PM   Specimen: BLOOD RIGHT HAND  Result Value Ref Range Status   Specimen Description BLOOD RIGHT HAND  Final   Special Requests   Final    BOTTLES DRAWN AEROBIC ONLY Blood Culture results may not be optimal due to an inadequate volume of blood received in culture bottles   Culture  Setup Time   Final    GRAM POSITIVE RODS AEROBIC BOTTLE ONLY CRITICAL RESULT CALLED TO, READ BACK BY AND VERIFIED WITH: PHARMD J. LEDFORD 07/18/22 @ 16109 BY AB    Culture (A)  Final    DIPHTHEROIDS(CORYNEBACTERIUM SPECIES) Standardized susceptibility testing for this organism is not available. Performed at [facility], [address] [place], [place] [address]    Report Status 07/22/2022 FINAL  Final  Culture, blood (routine x 2)     Status: None   Collection Time: 07/15/22  6:47 PM   Specimen: BLOOD RIGHT HAND  Result Value Ref Range Status   Specimen Description BLOOD RIGHT HAND  Final   Special Requests   Final    BOTTLES DRAWN AEROBIC ONLY Blood Culture adequate volume   Culture   Final    NO  GROWTH 5 DAYS Performed at [facility], [address] [place], [place] [address]    Report Status 07/20/2022 FINAL  Final    ABX started Antibiotics Given (last 72 hours)     None       No results found for the last 90 days.     ________________________________________________________________  Arterial ***Venous  Blood Gas result:  pH *** pCO2 ***; pO2 ***;     %O2 Sat ***.  ABG    Component Value Date/Time   HCO3 30.8 (H) 07/15/2022 1908   TCO2 29 07/17/2022 1535   O2SAT 85 07/15/2022 1908        __________________________________________________________ Recent Labs  Lab 07/29/22 1438  NA 136  K 4.6  CO2 24  GLUCOSE 87  BUN 40*  CREATININE 5.01*  CALCIUM 9.4    Cr  * stable,  Up from baseline see below Lab Results  Component Value Date   CREATININE 5.01 (H) 07/29/2022   CREATININE 3.60 (H) 07/18/2022   CREATININE 2.70 (H) 07/17/2022    Recent Labs  Lab 07/29/22 1438  AST 13*  ALT 7  ALKPHOS 116  BILITOT 0.8  PROT 6.9  ALBUMIN 2.9*   Lab Results  Component Value Date   CALCIUM 9.4 07/29/2022   PHOS 6.1 (H) 07/01/2022      Plt: Lab Results  Component Value Date   PLT 183 07/29/2022     Recent Labs  Lab 07/29/22 1438  WBC 7.4  NEUTROABS 5.6  HGB 10.7*  HCT 35.2*  MCV 98.6  PLT 183    HG/HCT stable     Component Value Date/Time   HGB 10.7 (L) 07/29/2022 1438   HCT 35.2 (L) 07/29/2022 1438   MCV 98.6 07/29/2022 1438   Recent Labs  Lab 07/29/22 1438  LIPASE 26   Recent Labs  Lab 07/29/22 1820  AMMONIA 20   _______________________________________________ Hospitalist was called for admission for *** There are no diagnoses linked to this encounter.   The following Work up has been ordered so far:  Orders Placed This Encounter  Procedures   CT Head Wo Contrast   CT ABDOMEN PELVIS W CONTRAST   Comprehensive metabolic panel   Lipase, blood   CBC with Diff   Ammonia   Urinalysis, Routine w reflex microscopic -Urine, Clean Catch   Diet NPO time specified   Initiate Carrier Fluid Protocol   Consult to hospitalist   Insert peripheral IV     OTHER Significant initial  Findings:  labs showing:     DM  labs:  HbA1C: Recent Labs    07/16/22 0255  HGBA1C 4.4*       CBG (last 3)  No results for input(s): "GLUCAP" in the last 72 hours.        Cultures:    Component Value Date/Time   SDES BLOOD RIGHT HAND 07/15/2022 1847   SDES BLOOD RIGHT HAND 07/15/2022 1847   SPECREQUEST  07/15/2022 1847    BOTTLES DRAWN AEROBIC  ONLY Blood Culture results may not be optimal due to an inadequate volume of blood received in culture bottles   SPECREQUEST  07/15/2022 1847    BOTTLES DRAWN AEROBIC ONLY Blood Culture adequate volume   CULT (A) 07/15/2022 1847    DIPHTHEROIDS(CORYNEBACTERIUM SPECIES) Standardized susceptibility testing for this organism is not available. Performed at [facility], [address], [place], [place] [address]    CULT  07/15/2022 1847    NO GROWTH 5 DAYS Performed  at [facility], [address], [place], [place] [address]    REPTSTATUS 07/22/2022 FINAL 07/15/2022 1847   REPTSTATUS 07/20/2022 FINAL 07/15/2022 1847     Radiological Exams on Admission: CT ABDOMEN PELVIS W CONTRAST  Result Date: 07/29/2022 CLINICAL DATA:  Acute abdominal pain EXAM: CT ABDOMEN AND PELVIS WITH CONTRAST TECHNIQUE: Multidetector CT imaging of the abdomen and pelvis was performed using the standard protocol following bolus administration of intravenous contrast. RADIATION DOSE REDUCTION: This exam was performed according to the departmental dose-optimization program which includes automated exposure control, adjustment of the mA and/or kV according to patient size and/or use of iterative reconstruction technique. CONTRAST:  75mL OMNIPAQUE IOHEXOL 350 MG/ML SOLN COMPARISON:  CT chest abdomen and pelvis 07/15/2022 FINDINGS: Lower chest: There is a chronic appearing small left pleural effusion with pleural thickening. Stable peripheral rounded parenchymal opacities are present favored as rounded atelectasis. The heart is enlarged. Hepatobiliary: Patient is status post cholecystectomy. There is intra and extrahepatic biliary ductal dilatation. The common bile duct measures 2.4 cm and contains hyperdense material similar to the prior study. No focal liver lesions are seen. Pancreas: Unremarkable. No pancreatic ductal dilatation or surrounding inflammatory changes. Spleen: Normal in size without focal abnormality.  Adrenals/Urinary Tract: Left kidney is absent. Bilateral adrenal glands are within normal limits. Mildly hyperdense lesion in the superior pole the right kidney measures 11 mm and appears unchanged. There subcentimeter cortical cysts similar to the prior study. There is no hydronephrosis or perinephric fat stranding. There is a calculus in the renal pelvis measuring 12 mm which appears unchanged. Right ureter and bladder are within normal limits. Stomach/Bowel: Stomach is within normal limits. Appendix appears normal. No evidence of bowel wall thickening, distention, or inflammatory changes. There is a large amount of stool throughout the colon. There is sigmoid colon diverticulosis. Vascular/Lymphatic: Extensive vascular calcifications are seen throughout the abdomen and pelvis as well as aorta. Aorta and IVC are normal in size. No enlarged lymph nodes are identified. Reproductive: Prostate is unremarkable. Other: No abdominal wall hernia or abnormality. No abdominopelvic ascites. Musculoskeletal: Previously identified intramuscular hematoma in the anterior left lower abdominal wall has decreased in size and now has the appearance of enhancing low-attenuation collections likely related to resolving hematomas. This area measures 7.6 by 2.5 by 2.0 cm. No acute hematomas are identified. Thoracic spinal cord stimulator device is present. Multilevel degenerative changes affect the spine. Chronic compression deformity of L2 is unchanged. IMPRESSION: 1. No acute localizing process in the abdomen or pelvis. 2. Stable intra and extrahepatic biliary ductal dilatation with hyperdense material in the common bile duct compatible with choledocholithiasis. 3. Stable nonobstructing right renal calculus. 4. Stable hyperdense lesion in the superior pole the right kidney, indeterminate. Recommend further evaluation with MRI or ultrasound. 5. Stable chronic small left pleural effusion with pleural thickening and rounded atelectasis.  6. Left lower abdominal wall intramuscular hematoma has decreased in size and now has the appearance of enhancing low-attenuation collections likely related to resolving hematomas. Aortic Atherosclerosis (ICD10-I70.0). Electronically Signed   By: Darliss Cheney M.D.   On: 07/29/2022 18:26   CT Head Wo Contrast  Result Date: 07/29/2022 CLINICAL DATA:  Mental status change, unknown cause. EXAM: CT HEAD WITHOUT CONTRAST TECHNIQUE: Contiguous axial images were obtained from the base of the skull through the vertex without intravenous contrast. RADIATION DOSE REDUCTION: This exam was performed according to the departmental dose-optimization program which includes automated exposure control, adjustment of the mA and/or kV according to patient size  and/or use of iterative reconstruction technique. COMPARISON:  CT head dated July 15, 2022 FINDINGS: Brain: No evidence of acute infarction, hemorrhage, hydrocephalus, extra-axial collection or mass lesion/mass effect. Prominence of the ventricles and sulci secondary to moderate cerebral atrophy. Diffuse low-attenuation of the periventricular white matter presumed advanced chronic microvascular ischemic changes. Vascular: No hyperdense vessel or unexpected calcification. Skull: Normal. Negative for fracture or focal lesion. Sinuses/Orbits: No acute finding. Other: None. IMPRESSION: 1. No acute intracranial abnormality. 2. Moderate cerebral atrophy and advanced chronic microvascular ischemic changes of the periventricular white matter. Electronically Signed   By: Larose Hires D.O.   On: 07/29/2022 18:22   VAS [place] UPPER EXTREMITY ARTERIAL DUPLEX  Result Date: 07/28/2022  UPPER EXTREMITY DUPLEX STUDY Patient Name:  FARIS COOLMAN  Date of Exam:   07/28/2022 Medical Rec #: [id number]              Accession #:    [id number] Date of Birth: [birth year]              Patient Gender: M Patient Age:   70 years Exam Location:  [facility] Procedure:      VAS [place]  UPPER EXTREMITY ARTERIAL DUPLEX Referring Phys: Emilie Rutter --------------------------------------------------------------------------------  Indications: Pain.  Risk Factors:  Hypertension, Diabetes, coronary artery disease. Other Factors: 06/25/22- Procedure Performed:                1. Ultrasound-guided access right common femoral artery                2. Limited arch aortogram                3. Catheter selection of right axillary artery with dedicated                right upper extremity arteriogram and catheter selection of right                radial artery                4. Right radial artery angioplasty (2.5 mm x 80 mm [place]) for                high grade 90% stenosis                5. Mynx closure of the right common femoral artery                07/17/22- AMPUTATION OF RIGHT RING FINGER AND PARTIAL AMPUTATION                OF RIGHT INDEX FINGER Performing Technologist: [place] Ponder RVS  Examination Guidelines: A complete evaluation includes B-mode imaging, spectral Doppler, color Doppler, and power Doppler as needed of all accessible portions of each vessel. Bilateral testing is considered an integral part of a complete examination. Limited examinations for reoccurring indications may be performed as noted.  Right Doppler Findings: +--------------+----------+----------+--------+--------+ Site          PSV (cm/s)Waveform  StenosisComments +--------------+----------+----------+--------+--------+ Subclavian Mid100       biphasic                   +--------------+----------+----------+--------+--------+ Axillary      101       biphasic                   +--------------+----------+----------+--------+--------+ Brachial Prox 128       triphasic                  +--------------+----------+----------+--------+--------+  Brachial Mid  124       triphasic                  +--------------+----------+----------+--------+--------+ Brachial Dist 110       triphasic                   +--------------+----------+----------+--------+--------+ Radial Prox   86        monophasic                 +--------------+----------+----------+--------+--------+ Radial Mid    34        monophasic                 +--------------+----------+----------+--------+--------+ Radial Dist   34        monophasic                 +--------------+----------+----------+--------+--------+ Ulnar Prox    73        biphasic                   +--------------+----------+----------+--------+--------+ Ulnar Mid     44        monophasic                 +--------------+----------+----------+--------+--------+ Ulnar Dist    27        monophasic                 +--------------+----------+----------+--------+--------+    Summary:  Right: No obstruction visualized in the right upper extremity. *See table(s) above for measurements and observations. Electronically signed by Heath Lark on 07/28/2022 at 11:51:59 AM.    Final    _______________________________________________________________________________________________________ Latest  Blood pressure (!) 158/69, pulse 66, temperature 98.6 F (37 C), temperature source Oral, resp. rate 16, height 5\' 6"  (1.676 m), weight 90.7 kg, SpO2 96%.   Vitals  labs and radiology finding personally reviewed  Review of Systems:    Pertinent positives include: ***  Constitutional:  No weight loss, night sweats, Fevers, chills, fatigue, weight loss  HEENT:  No headaches, Difficulty swallowing,Tooth/dental problems,Sore throat,  No sneezing, itching, ear ache, nasal congestion, post nasal drip,  Cardio-vascular:  No chest pain, Orthopnea, PND, anasarca, dizziness, palpitations.no Bilateral lower extremity swelling  GI:  No heartburn, indigestion, abdominal pain, nausea, vomiting, diarrhea, change in bowel habits, loss of appetite, melena, blood in stool, hematemesis Resp:  no shortness of breath at rest. No dyspnea on exertion, No excess mucus,  no productive cough, No non-productive cough, No coughing up of blood.No change in color of mucus.No wheezing. Skin:  no rash or lesions. No jaundice GU:  no dysuria, change in color of urine, no urgency or frequency. No straining to urinate.  No flank pain.  Musculoskeletal:  No joint pain or no joint swelling. No decreased range of motion. No back pain.  Psych:  No change in mood or affect. No depression or anxiety. No memory loss.  Neuro: no localizing neurological complaints, no tingling, no weakness, no double vision, no gait abnormality, no slurred speech, no confusion  All systems reviewed and apart from HOPI all are negative _______________________________________________________________________________________________ Past Medical History:   Past Medical History:  Diagnosis Date   Anemia    Back pain with radiation    Coronary artery disease    Depression    Diabetes mellitus without complication [facility])    Dialysis patient (HCC)    M, W, F   History of traumatic head injury    Hypertension    Renal  disorder    Thyroid disease       Past Surgical History:  Procedure Laterality Date   AMPUTATION Right 07/17/2022   Procedure: AMPUTATION OF RIGHT RING FINGER AND PARTIAL AMPUTATION OF RIGHT INDEX FINGER;  Surgeon: Gomez Cleverly, MD;  Location: [facility] OR;  Service: Orthopedics;  Laterality: Right;   CAPD REMOVAL N/A 06/30/2022   Procedure: REMOVAL CONTINUOUS AMBULATORY PERITONEAL DIALYSIS  (CAPD) CATHETER;  Surgeon: Victorino Sparrow, MD;  Location[facility] OR;  Service: Vascular;  Laterality: N/A;   ERCP     PERIPHERAL VASCULAR BALLOON ANGIOPLASTY Right 06/25/2022   Procedure: PERIPHERAL VASCULAR BALLOON ANGIOPLASTY;  Surgeon: Cephus Shelling, MD;  Location: [facility] INVASIVE CV LAB;  Service: Cardiovascular;  Laterality: Right;  Radial   UPPER EXTREMITY ANGIOGRAPHY N/A 06/25/2022   Procedure: Upper Extremity Angiography;  Surgeon: Cephus Shelling, MD;  Location: [facility] INVASIVE CV  LAB;  Service: Cardiovascular;  Laterality: N/A;    Social History:  Ambulatory *** independently cane, walker  wheelchair bound, bed bound    reports that he has never smoked. He has never used smokeless tobacco. He reports that he does not drink alcohol and does not use drugs.     Family History:  History reviewed. No pertinent family history. ______________________________________________________________________________________________ Allergies: Allergies  Allergen Reactions   Gabapentin     Cognitive impairment per spouse   Lipitor [Atorvastatin]     Severe muscle cramps per spouse   Nsaids Other (See Comments)    Severe renal impairment per [facility]   Pregabalin     Cognitive impairment. Dizziness.     Prior to Admission medications   Medication Sig Start Date End Date Taking? Authorizing Provider  acetaminophen (TYLENOL) 500 MG tablet Take 1,000 mg by mouth every 8 (eight) hours as needed for moderate pain or mild pain.    [provider]  albuterol (VENTOLIN HFA) 108 (90 Base) MCG/ACT inhaler Inhale 2 puffs into the lungs every 4 (four) hours as needed for wheezing or shortness of breath.    [provider]  aspirin EC 81 MG tablet Take 1 tablet (81 mg total) by mouth daily. Swallow whole. 07/02/22   Rodolph Bong, MD  buprenorphine (SUBUTEX) 2 MG SUBL SL tablet Place 1 tablet (2 mg total) under the tongue at bedtime. 07/01/22   Rodolph Bong, MD  busPIRone (BUSPAR) 10 MG tablet Take 1-2 tablets (10-20 mg total) by mouth See admin instructions. Take 2 tablets in the AM and 1 tablet in the PM. Patient taking differently: Take 10-20 mg by mouth See admin instructions. Take 2 tablets by mouth in the morning and 1 tablet in the at bedtime 07/01/22   Rodolph Bong, MD  calcitRIOL (ROCALTROL) 0.25 MCG capsule Take 0.25 mcg by mouth daily.    [provider]  Calcium Acetate 667 MG TABS Take 1 tablet by mouth 3 (three) times daily.    [provider]  clopidogrel (PLAVIX) 75 MG tablet Take 1 tablet (75 mg total) by mouth daily. 07/02/22   Rodolph Bong, MD  Darbepoetin Alfa (ARANESP) 100 MCG/0.5ML SOSY injection Inject 0.5 mLs (100 mcg total) into the skin every Thursday at 6pm. 07/03/22   Rodolph Bong, MD  escitalopram (LEXAPRO) 20 MG tablet Take 20 mg by mouth daily.    [provider]  levothyroxine (SYNTHROID) 125 MCG tablet Take 250 mcg by mouth daily before breakfast.    [provider]  lidocaine (LMX) 4 % cream Apply 1 Application  topically once a week. 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    [provider]  midodrine (PROAMATINE) 10 MG tablet Take 1 tablet (10 mg total) by mouth Every Tuesday,Thursday,and Saturday with dialysis. Patient taking differently: Take 10 mg by mouth Every Tuesday,Thursday,and Saturday with dialysis. May also give 1 tablet for sBP less than 90 07/03/22   Rodolph Bong, MD  oxyCODONE (OXY IR/ROXICODONE) 5 MG immediate release tablet Take 1 tablet (5 mg total) by mouth every 4 (four) hours as needed for moderate pain. 07/18/22   Hughie Closs, MD  pantoprazole (PROTONIX) 40 MG tablet Take 40 mg by mouth 2 (two) times daily. For GERD    [provider]  rosuvastatin (CRESTOR) 10 MG tablet Take 10 mg by mouth at bedtime.    [provider]    ___________________________________________________________________________________________________ Physical Exam:    07/29/2022    5:52 PM 07/29/2022    2:24 PM 07/28/2022   11:16 AM  Vitals with BMI  Height  5\' 6"  5\' 6"   Weight  200 lbs 200 lbs  BMI  32.3 32.3  Systolic 158 157 272  Diastolic 69 67 80  Pulse 66 79 61     1. General:  in No  Acute distress   Chronically ill   -appearing 2. Psychological: Alert and   Oriented 3. Head/ENT:   Moist   Mucous Membranes                           Head Non traumatic, neck supple                           Poor Dentition 4. SKIN:  decreased Skin turgor,  Skin clean Dry and intact no rash    5. Heart: Regular rate and rhythm no*** Murmur, no Rub or gallop 6. Lungs: ***Clear to auscultation bilaterally, no wheezes or crackles   7. Abdomen: Soft, ***non-tender, Non distended *** obese ***bowel sounds present 8. Lower extremities: no clubbing, cyanosis, no ***edema 9. Neurologically Grossly intact, moving all 4 extremities equally *** strength 5 out of 5 in all 4 extremities cranial nerves II through XII intact 10. MSK: Normal range of motion    Chart has been reviewed  ______________________________________________________________________________________________  Assessment/Plan  ***  Admitted for *** There are no diagnoses linked to this encounter.   Present on Admission: **None**     No problem-specific Assessment & Plan notes found for this encounter.    Other plan as per orders.  DVT prophylaxis:  SCD *** Lovenox       Code Status:    Code Status: Prior FULL CODE *** DNR/DNI ***comfort care as per patient ***family  I had personally discussed CODE STATUS with patient and family* I had spent *min discussing goals of care and CODE STATUS ACP has been reviewed ***   Family Communication:   Family not at  Bedside  plan of care was discussed on the phone with *** Son, Daughter, Wife, Husband, Sister, Brother , father, mother  Diet  Diet Orders (From admission, onward)     Start     Ordered   07/29/22 1438  Diet NPO time specified  Diet effective now        07/29/22 1437  Disposition Plan:   *** likely will need placement for rehabilitation                          Back to current facility when stable                            To home once workup is complete and patient is stable  ***Following barriers for discharge:                            Electrolytes corrected                                Anemia corrected                             Pain controlled with PO medications                               Afebrile, white count improving able to transition to PO antibiotics                             Will need to be able to tolerate PO                            Will likely need home health, home O2, set up                           Will need consultants to evaluate patient prior to discharge  ****EXPECT DC tomorrow       Consult Orders  (From admission, onward)           Start     Ordered   07/29/22 2123  Consult to hospitalist  Paged by AB  Once       Provider:  (Not yet assigned)  Question Answer Comment  Place call to: [facility]   Reason for Consult Admit      07/29/22 2122                              ***Would benefit from PT/OT eval prior to DC  Ordered                   Swallow eval - SLP ordered                   Diabetes care coordinator                   Transition of care consulted                   Nutrition    consulted                  Wound care  consulted                   Palliative care    consulted                   Behavioral health  consulted                    Consults called: ***     Admission status:  ED Disposition     None        Obs***  ***  inpatient     I Expect 2 midnight stay secondary to severity of patient's current illness need for inpatient interventions justified by the following: ***hemodynamic instability despite optimal treatment (tachycardia *hypotension * tachypnea *hypoxia, hypercapnia) * Severe lab/radiological/exam abnormalities including:     and extensive comorbidities including: *substance abuse  *Chronic pain *DM2  * CHF * CAD  * COPD/asthma *Morbid Obesity * CKD *dementia *liver disease *history of stroke with residual deficits *  malignancy, * sickle cell disease  History of amputation Chronic anticoagulation  That are currently affecting medical management.   I  expect  patient to be hospitalized for 2 midnights requiring inpatient medical care.  Patient is at high risk for adverse outcome (such as loss of life or disability) if not treated.  Indication for inpatient stay as follows:  Severe change from baseline regarding mental status Hemodynamic instability despite maximal medical therapy,  ongoing suicidal ideations,  severe pain requiring acute inpatient management,  inability to maintain oral hydration   persistent chest pain despite medical management Need for operative/procedural  intervention New or worsening hypoxia   Need for IV antibiotics, IV fluids, IV rate controling medications, IV antihypertensives, IV pain medications, IV anticoagulation, need for biPAP    Level of care   *** tele  For 12H 24H     medical floor       progressive tele indefinitely please discontinue once patient no longer qualifies COVID-19 Labs    Lab Results  Component Value Date   SARSCOV2NAA NEGATIVE 12/15/2021     Precautions: admitted as *** Covid Negative  ***asymptomatic screening protocol****PUI *** covid positive No active isolations ***If Covid PCR is negative  - please DC precautions - would need additional investigation given very high risk for false native test result        Kaelan Amble 07/29/2022, 10:05 PM ***  [facility]     after 2 AM please page floor coverage PA If 7AM-7PM, please contact the day team taking care of the patient using [email or web address]

## 2022-07-29 NOTE — ED Provider Notes (Signed)
[place] EMERGENCY DEPARTMENT AT [facility] Provider Note   CSN: [id number] Arrival date & time: 07/29/22  1417     History {Add pertinent medical, surgical, social history, OB history to HPI:1} Chief Complaint  Patient presents with   Abdominal Pain    Jp Eastham is a 72 y.o. male.  72 year old male with history of peripheral artery disease status post multiple right hand finger amputations, ESRD on IHD, diabetes, hypertension, and CAD on aspirin and Plavix who presents emergency department with abdominal pain and altered mental status.  History obtained predominantly for the patient's wife who says that in the past few days he started to become more confused.  Was recently transition from peritoneal dialysis to IHD and has gotten several sessions.  Was complaining of abdominal pain today at his dialysis session.  Says that PD catheter was removed earlier in the month.  Does still make urine.  No nausea or vomiting or diarrhea or fevers.  Currently stays at [facility] facility.  Unable to get CT scans due to spinal stimulator.       Home Medications Prior to Admission medications   Medication Sig Start Date End Date Taking? Authorizing Provider  acetaminophen (TYLENOL) 500 MG tablet Take 1,000 mg by mouth every 8 (eight) hours as needed for moderate pain or mild pain.    [provider]  albuterol (VENTOLIN HFA) 108 (90 Base) MCG/ACT inhaler Inhale 2 puffs into the lungs every 4 (four) hours as needed for wheezing or shortness of breath.    [provider]  aspirin EC 81 MG tablet Take 1 tablet (81 mg total) by mouth daily. Swallow whole. 07/02/22   Rodolph Bong, MD  buprenorphine (SUBUTEX) 2 MG SUBL SL tablet Place 1 tablet (2 mg total) under the tongue at bedtime. 07/01/22   Rodolph Bong, MD  busPIRone (BUSPAR) 10 MG tablet Take 1-2 tablets (10-20 mg total) by mouth See admin instructions. Take 2 tablets in the AM and 1 tablet in  the PM. Patient taking differently: Take 10-20 mg by mouth See admin instructions. Take 2 tablets by mouth in the morning and 1 tablet in the at bedtime 07/01/22   Rodolph Bong, MD  calcitRIOL (ROCALTROL) 0.25 MCG capsule Take 0.25 mcg by mouth daily.    [provider]  Calcium Acetate 667 MG TABS Take 1 tablet by mouth 3 (three) times daily.    [provider]  clopidogrel (PLAVIX) 75 MG tablet Take 1 tablet (75 mg total) by mouth daily. 07/02/22   Rodolph Bong, MD  Darbepoetin Alfa (ARANESP) 100 MCG/0.5ML SOSY injection Inject 0.5 mLs (100 mcg total) into the skin every Thursday at 6pm. 07/03/22   Rodolph Bong, MD  escitalopram (LEXAPRO) 20 MG tablet Take 20 mg by mouth daily.    [provider]  levothyroxine (SYNTHROID) 125 MCG tablet Take 250 mcg by mouth daily before breakfast.    [provider]  lidocaine (LMX) 4 % cream Apply 1 Application topically once a week. 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    [provider]  midodrine (PROAMATINE) 10 MG tablet Take 1 tablet (10 mg total) by mouth Every Tuesday,Thursday,and Saturday with dialysis. Patient taking differently: Take 10 mg by mouth Every Tuesday,Thursday,and Saturday with dialysis. May also give 1 tablet for sBP less than 90 07/03/22  Rodolph Bong, MD  oxyCODONE (OXY IR/ROXICODONE) 5 MG immediate release tablet Take 1 tablet (5 mg total) by mouth every 4 (four) hours as needed for moderate pain. 07/18/22   Hughie Closs, MD  pantoprazole (PROTONIX) 40 MG tablet Take 40 mg by mouth 2 (two) times daily. For GERD    [provider]  rosuvastatin (CRESTOR) 10 MG tablet Take 10 mg by mouth at bedtime.    [provider]      Allergies    Gabapentin, Lipitor [atorvastatin], Nsaids, and Pregabalin    Review of Systems   Review of Systems  Physical  Exam Updated Vital Signs BP (!) 158/69   Pulse 66   Temp 98.6 F (37 C) (Oral)   Resp 16   Ht 5\' 6"  (1.676 m)   Wt 90.7 kg   SpO2 96%   BMI 32.28 kg/m  Physical Exam Vitals and nursing note reviewed.  Constitutional:      General: He is not in acute distress.    Appearance: He is well-developed.  HENT:     Head: Normocephalic and atraumatic.     Right Ear: External ear normal.     Left Ear: External ear normal.     Nose: Nose normal.  Eyes:     Extraocular Movements: Extraocular movements intact.     Conjunctiva/sclera: Conjunctivae normal.     Pupils: Pupils are equal, round, and reactive to light.  Cardiovascular:     Rate and Rhythm: Normal rate and regular rhythm.  Pulmonary:     Effort: Pulmonary effort is normal. No respiratory distress.  Abdominal:     General: There is no distension.     Palpations: Abdomen is soft. There is no mass.     Tenderness: There is abdominal tenderness (Diffuse). There is no guarding.  Musculoskeletal:     Cervical back: Normal range of motion and neck supple.  Skin:    General: Skin is warm and dry.  Neurological:     Mental Status: He is alert.     Comments: Alert and oriented to self only.  Not at baseline per his wife.  Cranial nerves II through XII intact.  Full strength in upper and lower extremities.  Has some difficulty with concentration to cooperate with finger-nose and heel-to-shin.  Psychiatric:        Mood and Affect: Mood normal.        Behavior: Behavior normal.     ED Results / Procedures / Treatments   Labs (all labs ordered are listed, but only abnormal results are displayed) Labs Reviewed  COMPREHENSIVE METABOLIC PANEL - Abnormal; Notable for the following components:      Result Value   BUN 40 (*)    Creatinine, Ser 5.01 (*)    Albumin 2.9 (*)    AST 13 (*)    GFR, Estimated 12 (*)    All other components within normal limits  CBC WITH DIFFERENTIAL/PLATELET - Abnormal; Notable for the following  components:   RBC 3.57 (*)    Hemoglobin 10.7 (*)    HCT 35.2 (*)    All other components within normal limits  LIPASE, BLOOD  AMMONIA  TROPONIN I (HIGH SENSITIVITY)  TROPONIN I (HIGH SENSITIVITY)    EKG None  Radiology VAS [place] UPPER EXTREMITY ARTERIAL DUPLEX  Result Date: 07/28/2022  UPPER EXTREMITY DUPLEX STUDY Patient Name:  QUALYN OYERVIDES  Date of Exam:   07/28/2022 Medical Rec #: [id number]  Accession #:    [id number] Date of Birth: 07-Sep-1950              Patient Gender: M Patient Age:   83 years Exam Location:  [facility] Procedure:      VAS [place] UPPER EXTREMITY ARTERIAL DUPLEX Referring Phys: Emilie Rutter --------------------------------------------------------------------------------  Indications: Pain.  Risk Factors:  Hypertension, Diabetes, coronary artery disease. Other Factors: 06/25/22- Procedure Performed:                1. Ultrasound-guided access right common femoral artery                2. Limited arch aortogram                3. Catheter selection of right axillary artery with dedicated                right upper extremity arteriogram and catheter selection of right                radial artery                4. Right radial artery angioplasty (2.5 mm x 80 mm [place]) for                high grade 90% stenosis                5. Mynx closure of the right common femoral artery                07/17/22- AMPUTATION OF RIGHT RING FINGER AND PARTIAL AMPUTATION                OF RIGHT INDEX FINGER Performing Technologist: [place] Ponder RVS  Examination Guidelines: A complete evaluation includes B-mode imaging, spectral Doppler, color Doppler, and power Doppler as needed of all accessible portions of each vessel. Bilateral testing is considered an integral part of a complete examination. Limited examinations for reoccurring indications may be performed as noted.  Right Doppler Findings: +--------------+----------+----------+--------+--------+ Site           PSV (cm/s)Waveform  StenosisComments +--------------+----------+----------+--------+--------+ Subclavian Mid100       biphasic                   +--------------+----------+----------+--------+--------+ Axillary      101       biphasic                   +--------------+----------+----------+--------+--------+ Brachial Prox 128       triphasic                  +--------------+----------+----------+--------+--------+ Brachial Mid  124       triphasic                  +--------------+----------+----------+--------+--------+ Brachial Dist 110       triphasic                  +--------------+----------+----------+--------+--------+ Radial Prox   86        monophasic                 +--------------+----------+----------+--------+--------+ Radial Mid    34        monophasic                 +--------------+----------+----------+--------+--------+ Radial Dist   34        monophasic                 +--------------+----------+----------+--------+--------+  Ulnar Prox    73        biphasic                   +--------------+----------+----------+--------+--------+ Ulnar Mid     44        monophasic                 +--------------+----------+----------+--------+--------+ Ulnar Dist    27        monophasic                 +--------------+----------+----------+--------+--------+    Summary:  Right: No obstruction visualized in the right upper extremity. *See table(s) above for measurements and observations. Electronically signed by Heath Lark on 07/28/2022 at 11:51:59 AM.    Final     Procedures Procedures  {Document cardiac monitor, telemetry assessment procedure when appropriate:1}  Medications Ordered in ED Medications  iohexol (OMNIPAQUE) 350 MG/ML injection 75 mL (75 mLs Intravenous Contrast Given 07/29/22 1741)    ED Course/ Medical Decision Making/ A&P   {   Click here for ABCD2, HEART and other calculatorsREFRESH Note before signing :1}                           Medical Decision Making Amount and/or Complexity of Data Reviewed Labs: ordered. Radiology: ordered.  Risk Prescription drug management.   ***  {Document critical care time when appropriate:1} {Document review of labs and clinical decision tools ie heart score, Chads2Vasc2 etc:1}  {Document your independent review of radiology images, and any outside records:1} {Document your discussion with family members, caretakers, and with consultants:1} {Document social determinants of health affecting pt's care:1} {Document your decision making why or why not admission, treatments were needed:1} Final Clinical Impression(s) / ED Diagnoses Final diagnoses:  None    Rx / DC Orders ED Discharge Orders     None

## 2022-07-29 NOTE — ED Triage Notes (Signed)
Per [facility] pt coming from dialysis c/o abdominal pain. States staff would not complete treatment due to c/o pain. Patient has access to left arm for treatment. Patient had surgery on left hand last month and will not allow BP to be taken on arms.

## 2022-07-29 NOTE — Subjective & Objective (Addendum)
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

## 2022-07-30 ENCOUNTER — Encounter (HOSPITAL_COMMUNITY): Payer: Self-pay | Admitting: Internal Medicine

## 2022-07-30 ENCOUNTER — Inpatient Hospital Stay (HOSPITAL_COMMUNITY): Payer: Medicare Other

## 2022-07-30 ENCOUNTER — Observation Stay (HOSPITAL_COMMUNITY): Payer: Medicare Other

## 2022-07-30 ENCOUNTER — Observation Stay (HOSPITAL_COMMUNITY)
Admit: 2022-07-30 | Discharge: 2022-07-30 | Disposition: A | Discharge status: Home / Self Care | Payer: Medicare Other | Attending: Internal Medicine | Admitting: Internal Medicine

## 2022-07-30 ENCOUNTER — Other Ambulatory Visit: Payer: Self-pay

## 2022-07-30 DIAGNOSIS — F05 Delirium due to known physiological condition: Secondary | ICD-10-CM | POA: Diagnosis not present

## 2022-07-30 DIAGNOSIS — Z794 Long term (current) use of insulin: Secondary | ICD-10-CM | POA: Diagnosis not present

## 2022-07-30 DIAGNOSIS — E11649 Type 2 diabetes mellitus with hypoglycemia without coma: Secondary | ICD-10-CM | POA: Diagnosis not present

## 2022-07-30 DIAGNOSIS — I358 Other nonrheumatic aortic valve disorders: Secondary | ICD-10-CM | POA: Diagnosis present

## 2022-07-30 DIAGNOSIS — I5032 Chronic diastolic (congestive) heart failure: Secondary | ICD-10-CM | POA: Diagnosis present

## 2022-07-30 DIAGNOSIS — F0390 Unspecified dementia without behavioral disturbance: Secondary | ICD-10-CM | POA: Diagnosis present

## 2022-07-30 DIAGNOSIS — M79601 Pain in right arm: Secondary | ICD-10-CM

## 2022-07-30 DIAGNOSIS — Z66 Do not resuscitate: Secondary | ICD-10-CM | POA: Diagnosis not present

## 2022-07-30 DIAGNOSIS — G9340 Encephalopathy, unspecified: Secondary | ICD-10-CM | POA: Diagnosis present

## 2022-07-30 DIAGNOSIS — G9341 Metabolic encephalopathy: Secondary | ICD-10-CM | POA: Diagnosis not present

## 2022-07-30 DIAGNOSIS — E039 Hypothyroidism, unspecified: Secondary | ICD-10-CM | POA: Diagnosis present

## 2022-07-30 DIAGNOSIS — E1122 Type 2 diabetes mellitus with diabetic chronic kidney disease: Secondary | ICD-10-CM | POA: Diagnosis present

## 2022-07-30 DIAGNOSIS — R410 Disorientation, unspecified: Secondary | ICD-10-CM | POA: Diagnosis not present

## 2022-07-30 DIAGNOSIS — I96 Gangrene, not elsewhere classified: Secondary | ICD-10-CM | POA: Diagnosis not present

## 2022-07-30 DIAGNOSIS — G928 Other toxic encephalopathy: Secondary | ICD-10-CM | POA: Diagnosis present

## 2022-07-30 DIAGNOSIS — R569 Unspecified convulsions: Secondary | ICD-10-CM

## 2022-07-30 DIAGNOSIS — I639 Cerebral infarction, unspecified: Secondary | ICD-10-CM | POA: Diagnosis not present

## 2022-07-30 DIAGNOSIS — F32A Depression, unspecified: Secondary | ICD-10-CM | POA: Diagnosis present

## 2022-07-30 DIAGNOSIS — N2581 Secondary hyperparathyroidism of renal origin: Secondary | ICD-10-CM | POA: Diagnosis present

## 2022-07-30 DIAGNOSIS — I132 Hypertensive heart and chronic kidney disease with heart failure and with stage 5 chronic kidney disease, or end stage renal disease: Secondary | ICD-10-CM | POA: Diagnosis present

## 2022-07-30 DIAGNOSIS — I6329 Cerebral infarction due to unspecified occlusion or stenosis of other precerebral arteries: Secondary | ICD-10-CM | POA: Diagnosis present

## 2022-07-30 DIAGNOSIS — E1151 Type 2 diabetes mellitus with diabetic peripheral angiopathy without gangrene: Secondary | ICD-10-CM | POA: Diagnosis present

## 2022-07-30 DIAGNOSIS — N186 End stage renal disease: Secondary | ICD-10-CM | POA: Diagnosis present

## 2022-07-30 DIAGNOSIS — R109 Unspecified abdominal pain: Secondary | ICD-10-CM | POA: Diagnosis not present

## 2022-07-30 DIAGNOSIS — I4891 Unspecified atrial fibrillation: Secondary | ICD-10-CM | POA: Diagnosis not present

## 2022-07-30 DIAGNOSIS — K921 Melena: Secondary | ICD-10-CM | POA: Diagnosis not present

## 2022-07-30 DIAGNOSIS — Z1152 Encounter for screening for COVID-19: Secondary | ICD-10-CM | POA: Diagnosis not present

## 2022-07-30 DIAGNOSIS — Z992 Dependence on renal dialysis: Secondary | ICD-10-CM | POA: Diagnosis not present

## 2022-07-30 DIAGNOSIS — G40909 Epilepsy, unspecified, not intractable, without status epilepticus: Secondary | ICD-10-CM | POA: Diagnosis present

## 2022-07-30 DIAGNOSIS — D62 Acute posthemorrhagic anemia: Secondary | ICD-10-CM | POA: Diagnosis not present

## 2022-07-30 DIAGNOSIS — X58XXXA Exposure to other specified factors, initial encounter: Secondary | ICD-10-CM | POA: Diagnosis present

## 2022-07-30 DIAGNOSIS — Z515 Encounter for palliative care: Secondary | ICD-10-CM | POA: Diagnosis not present

## 2022-07-30 DIAGNOSIS — Z6832 Body mass index (BMI) 32.0-32.9, adult: Secondary | ICD-10-CM | POA: Diagnosis not present

## 2022-07-30 DIAGNOSIS — D631 Anemia in chronic kidney disease: Secondary | ICD-10-CM | POA: Diagnosis present

## 2022-07-30 DIAGNOSIS — E1169 Type 2 diabetes mellitus with other specified complication: Secondary | ICD-10-CM | POA: Diagnosis not present

## 2022-07-30 LAB — COMPREHENSIVE METABOLIC PANEL
ALT: 7 U/L (ref 0–44)
AST: 10 U/L — ABNORMAL LOW (ref 15–41)
Albumin: 2.6 g/dL — ABNORMAL LOW (ref 3.5–5.0)
Alkaline Phosphatase: 99 U/L (ref 38–126)
Anion gap: 14 (ref 5–15)
BUN: 46 mg/dL — ABNORMAL HIGH (ref 8–23)
CO2: 22 mmol/L (ref 22–32)
Calcium: 8.9 mg/dL (ref 8.9–10.3)
Chloride: 98 mmol/L (ref 98–111)
Creatinine, Ser: 5.85 mg/dL — ABNORMAL HIGH (ref 0.61–1.24)
GFR, Estimated: 10 mL/min — ABNORMAL LOW (ref >60)
Glucose, Bld: 84 mg/dL (ref 70–99)
Potassium: 4.8 mmol/L (ref 3.5–5.1)
Sodium: 134 mmol/L — ABNORMAL LOW (ref 135–145)
Total Bilirubin: 0.5 mg/dL (ref 0.3–1.2)
Total Protein: 6.2 g/dL — ABNORMAL LOW (ref 6.5–8.1)

## 2022-07-30 LAB — CBG MONITORING, ED: Glucose-Capillary: 74 mg/dL (ref 70–99)

## 2022-07-30 LAB — CBC
HCT: 32.1 % — ABNORMAL LOW (ref 39.0–52.0)
Hemoglobin: 9.7 g/dL — ABNORMAL LOW (ref 13.0–17.0)
MCH: 28.8 pg (ref 26.0–34.0)
MCHC: 30.2 g/dL (ref 30.0–36.0)
MCV: 95.3 fL (ref 80.0–100.0)
Platelets: 154 10*3/uL (ref 150–400)
RBC: 3.37 MIL/uL — ABNORMAL LOW (ref 4.22–5.81)
RDW: 15.4 % (ref 11.5–15.5)
WBC: 6.3 10*3/uL (ref 4.0–10.5)
nRBC: 0 % (ref 0.0–0.2)

## 2022-07-30 LAB — I-STAT VENOUS BLOOD GAS, ED
Acid-Base Excess: 1 mmol/L (ref 0.0–2.0)
Bicarbonate: 27.2 mmol/L (ref 20.0–28.0)
Calcium, Ion: 1.18 mmol/L (ref 1.15–1.40)
HCT: 32 % — ABNORMAL LOW (ref 39.0–52.0)
Hemoglobin: 10.9 g/dL — ABNORMAL LOW (ref 13.0–17.0)
O2 Saturation: 85 %
Potassium: 4.7 mmol/L (ref 3.5–5.1)
Sodium: 136 mmol/L (ref 135–145)
TCO2: 29 mmol/L (ref 22–32)
pCO2, Ven: 49.5 mmHg (ref 44–60)
pH, Ven: 7.348 (ref 7.25–7.43)
pO2, Ven: 54 mmHg — ABNORMAL HIGH (ref 32–45)

## 2022-07-30 LAB — GLUCOSE, CAPILLARY
Glucose-Capillary: 106 mg/dL — ABNORMAL HIGH (ref 70–99)
Glucose-Capillary: 135 mg/dL — ABNORMAL HIGH (ref 70–99)
Glucose-Capillary: 59 mg/dL — ABNORMAL LOW (ref 70–99)
Glucose-Capillary: 61 mg/dL — ABNORMAL LOW (ref 70–99)
Glucose-Capillary: 63 mg/dL — ABNORMAL LOW (ref 70–99)
Glucose-Capillary: 66 mg/dL — ABNORMAL LOW (ref 70–99)
Glucose-Capillary: 67 mg/dL — ABNORMAL LOW (ref 70–99)
Glucose-Capillary: 74 mg/dL (ref 70–99)
Glucose-Capillary: 82 mg/dL (ref 70–99)
Glucose-Capillary: 91 mg/dL (ref 70–99)

## 2022-07-30 LAB — HEPATITIS B SURFACE ANTIGEN: Hepatitis B Surface Ag: NONREACTIVE

## 2022-07-30 LAB — VITAMIN D 25 HYDROXY (VIT D DEFICIENCY, FRACTURES): Vit D, 25-Hydroxy: 43.59 ng/mL (ref 30–100)

## 2022-07-30 LAB — FOLATE: Folate: 9.4 ng/mL (ref >5.9)

## 2022-07-30 LAB — MRSA NEXT GEN BY PCR, NASAL: MRSA by PCR Next Gen: NOT DETECTED

## 2022-07-30 LAB — TSH: TSH: 6.847 u[IU]/mL — ABNORMAL HIGH (ref 0.350–4.500)

## 2022-07-30 LAB — MAGNESIUM: Magnesium: 1.4 mg/dL — ABNORMAL LOW (ref 1.7–2.4)

## 2022-07-30 LAB — PHOSPHORUS: Phosphorus: 5.2 mg/dL — ABNORMAL HIGH (ref 2.5–4.6)

## 2022-07-30 LAB — VITAMIN B12: Vitamin B-12: 724 pg/mL (ref 180–914)

## 2022-07-30 LAB — I-STAT CG4 LACTIC ACID, ED: Lactic Acid, Venous: 0.6 mmol/L (ref 0.5–1.9)

## 2022-07-30 LAB — SEDIMENTATION RATE: Sed Rate: 34 mm/hr — ABNORMAL HIGH (ref 0–16)

## 2022-07-30 LAB — SARS CORONAVIRUS 2 BY RT PCR: SARS Coronavirus 2 by RT PCR: NEGATIVE

## 2022-07-30 MED ORDER — CHLORHEXIDINE GLUCONATE CLOTH 2 % EX PADS
6.0000 | MEDICATED_PAD | Freq: Every day | CUTANEOUS | Status: DC
  Administered 2022-07-30 – 2022-08-11 (×13): 6 via TOPICAL

## 2022-07-30 MED ORDER — ACETAMINOPHEN 650 MG RE SUPP: 650.0000 mg | Freq: Four times a day (QID) | RECTAL | Status: DC | PRN

## 2022-07-30 MED ORDER — GLUCAGON HCL RDNA (DIAGNOSTIC) 1 MG IJ SOLR
1.0000 mg | Freq: Once | INTRAMUSCULAR | Status: AC | PRN
  Administered 2022-07-30: 1 mg via INTRAMUSCULAR
  Filled 2022-07-30: qty 1

## 2022-07-30 MED ORDER — SODIUM CHLORIDE 0.9 % IV SOLN: 250.0000 mL | INTRAVENOUS | Status: DC | PRN

## 2022-07-30 MED ORDER — ROSUVASTATIN CALCIUM 5 MG PO TABS
10.0000 mg | ORAL_TABLET | Freq: Every day | ORAL | Status: DC
  Administered 2022-07-31 – 2022-08-10 (×11): 10 mg via ORAL
  Filled 2022-07-30 (×11): qty 2

## 2022-07-30 MED ORDER — MAGNESIUM SULFATE 2 GM/50ML IV SOLN
2.0000 g | Freq: Once | INTRAVENOUS | Status: AC
  Administered 2022-07-30: 2 g via INTRAVENOUS
  Filled 2022-07-30: qty 50

## 2022-07-30 MED ORDER — DEXTROSE 5 % IV SOLN: INTRAVENOUS | Status: DC

## 2022-07-30 MED ORDER — MIDODRINE HCL 5 MG PO TABS
10.0000 mg | ORAL_TABLET | ORAL | Status: DC
  Administered 2022-07-31 – 2022-08-05 (×4): 10 mg via ORAL
  Filled 2022-07-30 (×9): qty 2

## 2022-07-30 MED ORDER — CALCIUM ACETATE (PHOS BINDER) 667 MG PO CAPS
667.0000 mg | ORAL_CAPSULE | Freq: Three times a day (TID) | ORAL | Status: DC
  Administered 2022-07-30 – 2022-08-03 (×6): 667 mg via ORAL
  Filled 2022-07-30 (×6): qty 1

## 2022-07-30 MED ORDER — HALOPERIDOL 1 MG PO TABS
2.0000 mg | ORAL_TABLET | Freq: Four times a day (QID) | ORAL | Status: DC | PRN
  Filled 2022-07-30: qty 2

## 2022-07-30 MED ORDER — SODIUM CHLORIDE 0.9% FLUSH
3.0000 mL | Freq: Two times a day (BID) | INTRAVENOUS | Status: DC
  Administered 2022-07-30 – 2022-08-11 (×21): 3 mL via INTRAVENOUS

## 2022-07-30 MED ORDER — CLOPIDOGREL BISULFATE 75 MG PO TABS
75.0000 mg | ORAL_TABLET | Freq: Every day | ORAL | Status: DC
  Administered 2022-07-30 – 2022-08-10 (×11): 75 mg via ORAL
  Filled 2022-07-30 (×11): qty 1

## 2022-07-30 MED ORDER — HALOPERIDOL LACTATE 5 MG/ML IJ SOLN
5.0000 mg | Freq: Once | INTRAMUSCULAR | Status: AC
  Administered 2022-07-30: 5 mg via INTRAVENOUS
  Filled 2022-07-30: qty 1

## 2022-07-30 MED ORDER — LEVETIRACETAM IN NACL 500 MG/100ML IV SOLN
500.0000 mg | Freq: Two times a day (BID) | INTRAVENOUS | Status: DC
  Administered 2022-07-30: 500 mg via INTRAVENOUS
  Filled 2022-07-30 (×2): qty 100

## 2022-07-30 MED ORDER — ONDANSETRON HCL 4 MG/2ML IJ SOLN: 4.0000 mg | Freq: Four times a day (QID) | INTRAMUSCULAR | Status: DC | PRN

## 2022-07-30 MED ORDER — LEVETIRACETAM IN NACL 1000 MG/100ML IV SOLN
1000.0000 mg | Freq: Once | INTRAVENOUS | Status: AC
  Administered 2022-07-30: 1000 mg via INTRAVENOUS
  Filled 2022-07-30: qty 100

## 2022-07-30 MED ORDER — SODIUM CHLORIDE 0.9% FLUSH: 3.0000 mL | INTRAVENOUS | Status: DC | PRN

## 2022-07-30 MED ORDER — CALCITRIOL 0.25 MCG PO CAPS
0.2500 ug | ORAL_CAPSULE | Freq: Every day | ORAL | Status: DC
  Administered 2022-07-30 – 2022-08-06 (×8): 0.25 ug via ORAL
  Filled 2022-07-30 (×8): qty 1

## 2022-07-30 MED ORDER — GLUCAGON HCL RDNA (DIAGNOSTIC) 1 MG IJ SOLR
1.0000 mg | INTRAMUSCULAR | Status: AC
  Administered 2022-07-30: 1 mg via INTRAMUSCULAR
  Filled 2022-07-30: qty 1

## 2022-07-30 MED ORDER — ONDANSETRON HCL 4 MG PO TABS: 4.0000 mg | ORAL_TABLET | Freq: Four times a day (QID) | ORAL | Status: DC | PRN

## 2022-07-30 MED ORDER — PANTOPRAZOLE SODIUM 40 MG PO TBEC
40.0000 mg | DELAYED_RELEASE_TABLET | Freq: Two times a day (BID) | ORAL | Status: DC
  Administered 2022-07-30 – 2022-08-11 (×23): 40 mg via ORAL
  Filled 2022-07-30 (×23): qty 1

## 2022-07-30 MED ORDER — ACETAMINOPHEN 325 MG PO TABS
650.0000 mg | ORAL_TABLET | Freq: Four times a day (QID) | ORAL | Status: DC | PRN
  Administered 2022-08-05 – 2022-08-10 (×6): 650 mg via ORAL
  Filled 2022-07-30 (×7): qty 2

## 2022-07-30 MED ORDER — ALBUTEROL SULFATE (2.5 MG/3ML) 0.083% IN NEBU: 3.0000 mL | INHALATION_SOLUTION | RESPIRATORY_TRACT | Status: DC | PRN

## 2022-07-30 MED ORDER — ASPIRIN 81 MG PO TBEC
81.0000 mg | DELAYED_RELEASE_TABLET | Freq: Every day | ORAL | Status: DC
  Administered 2022-07-30 – 2022-08-10 (×11): 81 mg via ORAL
  Filled 2022-07-30 (×11): qty 1

## 2022-07-30 MED ORDER — LEVOTHYROXINE SODIUM 50 MCG PO TABS
250.0000 ug | ORAL_TABLET | Freq: Every day | ORAL | Status: DC
  Administered 2022-07-30 – 2022-08-10 (×10): 250 ug via ORAL
  Filled 2022-07-30 (×11): qty 1

## 2022-07-30 NOTE — Progress Notes (Signed)
Patient is having an active seizure and has no medication on the [facility] Wife and Lab tech assisted at bedside. Positioned patient on his left side and protected patient airway. Patient had full body jerking movements during the seizure. Seizure lasted for 3 minutes. Contacted the provider Marlin Canary DO and Rapid response nurse. Vital signs and blood sugar checked. Vann DO and Rapid response nurse came to bedside to assess pt. New orders placed in chart and upgrade patient status to progressive care. Patient wife at bedside and verbalize understanding and all questions and concerns addressed.

## 2022-07-30 NOTE — Assessment & Plan Note (Signed)
BG has been running low diet controlled Monitor CBG

## 2022-07-30 NOTE — Progress Notes (Signed)
Pt receives out-pt HD at [facility] on [facility]. Appears pt admitted from snf according to chart documentation. Will assist as needed.   Olivia Canter Renal Navigator [phone]

## 2022-07-30 NOTE — Progress Notes (Signed)
LTM EEG hooked up and running - no initial skin breakdown - push button tested - [facility] monitoring.  

## 2022-07-30 NOTE — Evaluation (Signed)
Clinical/Bedside Swallow Evaluation Patient Details  Name: Chase [name] MRN: [id number] Date of Birth: [birth year]  Today's Date: 07/30/2022 Time: SLP Start Time (ACUTE ONLY): 0900 SLP Stop Time (ACUTE ONLY): 0915 SLP Time Calculation (min) (ACUTE ONLY): 15 min  Past Medical History:  Past Medical History:  Diagnosis Date   Anemia    Back pain with radiation    Coronary artery disease    Depression    Diabetes mellitus without complication (HCC)    Dialysis patient (HCC)    M, W, F   History of traumatic head injury    Hypertension    Renal disorder    Thyroid disease    Past Surgical History:  Past Surgical History:  Procedure Laterality Date   AMPUTATION Right 07/17/2022   Procedure: AMPUTATION OF RIGHT RING FINGER AND PARTIAL AMPUTATION OF RIGHT INDEX FINGER;  Surgeon: Gomez Cleverly, MD;  Location: [facility] OR;  Service: Orthopedics;  Laterality: Right;   CAPD REMOVAL N/A 06/30/2022   Procedure: REMOVAL CONTINUOUS AMBULATORY PERITONEAL DIALYSIS  (CAPD) CATHETER;  Surgeon: Victorino Sparrow, MD;  Location: [facility] OR;  Service: Vascular;  Laterality: N/A;   ERCP     PERIPHERAL VASCULAR BALLOON ANGIOPLASTY Right 06/25/2022   Procedure: PERIPHERAL VASCULAR BALLOON ANGIOPLASTY;  Surgeon: Cephus Shelling, MD;  Location: [facility] INVASIVE CV LAB;  Service: Cardiovascular;  Laterality: Right;  Radial   UPPER EXTREMITY ANGIOGRAPHY N/A 06/25/2022   Procedure: Upper Extremity Angiography;  Surgeon: Cephus Shelling, MD;  Location: [facility] INVASIVE CV LAB;  Service: Cardiovascular;  Laterality: N/A;   HPI:  Pt is a 72 yo male presenting to ED from [facility] 7/23 c/o abdominal pain after dialysis and AMS. Presents with dry gangrene of fingers s/p amputation. Admitted for arch aortogram from transfemoral approach with RUE arteriogram with R radial angioplasty 6/19 and subsequently underwent R ring and index finger amputation 7/11. PMH includes ESRD on HD TTS, CAD, hypothyroidism, HTN, anemia,  depression/anxiety, GERD, T2DM, chronic back and neck pain    Assessment / Plan / Recommendation  Clinical Impression  Pt reports no prior difficulty swallowing and no history of PNA or reflux. Oral motor exam WFL. Pt appears quite drowsy, although note RN reports he was awake most of the night in pain. Pt observed with trials of solids and thin liquids from his breakfast tray with no overt s/s of dysphagia or aspiration. RN provided meds during session, which pt chewed and swallowed with thin liquids with no difficulty. Pt reports typically self-feeding independently PTA, although made no attempts to do so today. Due to changes in mentation, recommend Dys 3 diet with thin liquids and full supervision to assist with self-feeding. Discussed with RN, who agrees. Will f/u to ensure swallowing safety and upgrade as able. SLP Visit Diagnosis: Dysphagia, unspecified (R13.10)    Aspiration Risk  Mild aspiration risk    Diet Recommendation Dysphagia 3 (Mech soft);Thin liquid    Liquid Administration via: Cup;Straw Medication Administration: Whole meds with liquid Supervision: Staff to assist with self feeding;Full supervision/cueing for compensatory strategies Compensations: Slow rate;Small sips/bites Postural Changes: Seated upright at 90 degrees;Remain upright for at least 30 minutes after po intake    Other  Recommendations Oral Care Recommendations: Oral care BID    Recommendations for follow up therapy are one component of a multi-disciplinary discharge planning process, led by the attending physician.  Recommendations may be updated based on patient status, additional functional criteria and insurance authorization.  Follow up Recommendations No SLP follow up  Assistance Recommended at Discharge    Functional Status Assessment Patient has had a recent decline in their functional status and demonstrates the ability to make significant improvements in function in a reasonable and  predictable amount of time.  Frequency and Duration min 2x/week  1 week       Prognosis Prognosis for improved oropharyngeal function: Good      Swallow Study   General HPI: Pt is a 72 yo male presenting to ED from [facility] 7/23 c/o abdominal pain after dialysis and AMS. Presents with dry gangrene of fingers s/p amputation. Admitted for arch aortogram from transfemoral approach with RUE arteriogram with R radial angioplasty 6/19 and subsequently underwent R ring and index finger amputation 7/11. PMH includes ESRD on HD TTS, CAD, hypothyroidism, HTN, anemia, depression/anxiety, GERD, T2DM, chronic back and neck pain Type of Study: Bedside Swallow Evaluation Previous Swallow Assessment: none in chart Diet Prior to this Study: Regular;Thin liquids (Level 0) Temperature Spikes Noted: No Respiratory Status: Room air History of Recent Intubation: No Behavior/Cognition: Alert;Cooperative;Requires cueing Oral Cavity Assessment: Within Functional Limits Oral Care Completed by SLP: No Oral Cavity - Dentition: Adequate natural dentition Vision: Functional for self-feeding Self-Feeding Abilities: Needs assist Patient Positioning: Upright in chair Baseline Vocal Quality: Normal Volitional Cough: Strong Volitional Swallow: Able to elicit    Oral/Motor/Sensory Function Overall Oral Motor/Sensory Function: Within functional limits   Ice Chips     Thin Liquid Thin Liquid: Within functional limits    Nectar Thick     Honey Thick     Puree     Solid     Solid: Within functional limits      Gwynneth Aliment, M.A., CF-SLP Speech Language Pathology, Acute Rehabilitation Services  Secure Chat preferred [phone]  07/30/2022,10:09 AM

## 2022-07-30 NOTE — Progress Notes (Signed)
Called to bedside for seizure like activity-- activity stopped when I reached the floor.  Blood sugar 74.  Neurology consult placed.  Keppra 1 gram x 1 and 500mg  BID ordered for tomorrow.  EEG order placed as well.  Will transfer to higher level of care (progressive as patient protecting airway) Marlin Canary DO

## 2022-07-30 NOTE — Consult Note (Signed)
Neurology Consultation Reason for Consult: seizures  Referring Physician: Dr. Benjamine Mola  CC: abdominal pain   History is obtained from:wife at bedside and chart review  HPI: Chase [name] is a 72 y.o. male past medical history significant for amputation of right ring finger crush amputation of right index finger due to gangrene 07/17/2022, currently on aspirin Plavix and statin, CAD, depression, DVT on warfarin, CKD with Monday Wednesday Friday dialysis, PD catheter removed earlier in July, history of traumatic head injury presented to the ED from dialysis complaining of abdominal pain for 1 day 7/23.  On admission patient was alert and oriented to self only not at baseline per wife.  7/24 patient had a seizure Estimated arrival 30 minutes he was given 1 g Keppra load and started on 500 mg twice daily maintenance dosage.  Neurology consulted Per chart review wife states that he has had increased confusion and decreased memory for about 2 to 3 months and that he often becomes increasingly altered risk for developing infections. Ortho evaluated right hand d/t redness and edema from recent amputation surgery, no intervention needed. On exam, he is having right-sided weakness, not localizing to that side but is to the left side.  With eyes forced open, patient has some roving eye movements, and some rhythmic tongue movements.  With per wife, patient was not like this yesterday. Stat CT and LTM EEG ordered. Discussed with Dr. Benjamine Mola.   ROS: Unable to obtain due to altered mental status.   Past Medical History:  Diagnosis Date   Anemia    Back pain with radiation    Coronary artery disease    Depression    Diabetes mellitus without complication [facility])    Dialysis patient [facility])    TTS at [facility] [address] / started 2020   History of traumatic head injury    Hypertension    Thyroid disease     History reviewed. No pertinent family history.  Social History:  reports that he has never  smoked. He has never used smokeless tobacco. He reports that he does not drink alcohol and does not use drugs.  Exam: Current vital signs: BP (!) 162/72   Pulse 74   Temp 98.9 F (37.2 C) (Oral)   Resp 17   Ht 5\' 6"  (1.676 m)   Wt 90.7 kg   SpO2 98%   BMI 32.28 kg/m  Vital signs in last 24 hours: Temp:  [97.8 F (36.6 C)-98.9 F (37.2 C)] 98.9 F (37.2 C) (07/24 1228) Pulse Rate:  [66-79] 74 (07/24 1228) Resp:  [16-18] 17 (07/24 0928) BP: (135-178)/(63-106) 162/72 (07/24 1232) SpO2:  [85 %-98 %] 98 % (07/24 1232) Weight:  [90.7 kg] 90.7 kg (07/23 1424)   Physical Exam  Appears well-developed and well-nourished.   Neuro: Mental Status: Patient is obtunded.  Does not open eyes to voice or noxious stimuli.  With forced eye opening, exhibits roving eye movements. does not track.  By patient.  He was noted on movement seen.  Left arm localizes to pain. LLE withdraws against gravity.  Trace withdrawal RUE/RLE.   I have reviewed labs in epic and the results pertinent to this consultation are: Mag: 1.4 CBG: 67 (0728) Ammonia: 20   I have reviewed the images obtained:  CXR: Small left pleural effusion with left lower lobe atelectasis or infiltrate  CT Head: pending  Impression: Chase [name] is a 72 y.o. male PMH significant for amputation of right ring finger crush amputation of right  index finger 07/17/2022 , on aspirin Plavix and statin, CAD, depression, DVT on warfarin, CKD with MWF dialysis, PD catheter removed in July, history of traumatic head injury presented to the ED from dialysis complaining of abdominal pain for 1 day 7/23.  On admission patient was alert and oriented to self only not at baseline per wife.  7/24 patient had a witnessed seizure. On exam today, patient is obtunded and exhibiting possible seizure activities along with right-sided weakness, right facial droop.   IMP: -Toxic metabolic encephalopathy  - ?acute on chronic worsening of renal  function -Seizure -?DDS  Recommendations: Stat CT.  Repeat in 24 hours. Can not get MRI due to spinal stimulator Stat LTM EEG - prelim read negative for seizures. Continue 500 Keppra twice daily. Replace magnesium/electrolyte management per primary Infectious workup, per primary Dementia panel, B6 labs  Neurology will follow   Pt seen by Neuro NP/APP and later by MD. Note/plan to be edited by MD as needed.    Lynnae January, DNP, AGACNP-BC [facility] Please use AMION for contact information & EPIC for messaging.   Attending Neurohospitalist Addendum Patient seen and examined with APP/Resident. Agree with the history and physical as documented above. Agree with the plan as documented, which I helped formulate. I have independently reviewed the chart, obtained history, review of systems and examined the patient.I have personally reviewed pertinent head/neck/spine imaging (CT/MRI). Plan dw Dr. Benjamine Mola Please feel free to call with any questions.  -- [name], MD Neurologist [facility] Pager: [phone]

## 2022-07-30 NOTE — Consult Note (Signed)
Renal Service Consult Note [facility]  Chase [name] 07/30/2022 Maree Krabbe, MD Requesting Physician: Dr. Benjamine Mola  Reason for Consult: ESRD pt w/ AMS HPI: The patient is a 72 y.o. year-old w/ PMH as below who presented to ED yesterday c/o abd pain. Pt was sent from dialysis unit in [place] where he had dialysis then stopped due to agitation.  In ED pt c/o abd pain. Pt was confused, per wife this confusion is worse than usual confusion (x 2-3 mos). Recently transitioned from PD to HD. Pt recently had dry gangrene seen by [facility] and underwent RUE angiogram 07/17/22 showing occluded ulnar artery and they did a R radial artery angioplasty and two R finger amputations. In ED [facility]. Pt was admitted. We are asked to see for dialysis.   Pt seen in room. Pt is agitated, restless sitting up in the chair. States he is having "gas pains". Per wife pt's has had some developing decline in memory over past 2-3 months. Most days he is okay but then will have days w/ sig confusion and agitation. Has chronic back pain on buprenorphine. Taking Oxy IR at home as well, holding for now. Pt doesn't miss dialysis sessions, on HD since 2020.   ROS - per the wife --> no recent  CP, no joint pain, no HA, no blurry vision, no rash, no diarrhea, no nausea/ vomiting   Past Medical History  Past Medical History:  Diagnosis Date   Anemia    Back pain with radiation    Coronary artery disease    Depression    Diabetes mellitus without complication (HCC)    Dialysis patient (HCC)    M, W, F   History of traumatic head injury    Hypertension    Renal disorder    Thyroid disease    Past Surgical History  Past Surgical History:  Procedure Laterality Date   AMPUTATION Right 07/17/2022   Procedure: AMPUTATION OF RIGHT RING FINGER AND PARTIAL AMPUTATION OF RIGHT INDEX FINGER;  Surgeon: Gomez Cleverly, MD;  Location: [facility] OR;  Service: Orthopedics;  Laterality: Right;   CAPD REMOVAL N/A 06/30/2022    Procedure: REMOVAL CONTINUOUS AMBULATORY PERITONEAL DIALYSIS  (CAPD) CATHETER;  Surgeon: Victorino Sparrow, MD;  Location: [facility] OR;  Service: Vascular;  Laterality: N/A;   ERCP     PERIPHERAL VASCULAR BALLOON ANGIOPLASTY Right 06/25/2022   Procedure: PERIPHERAL VASCULAR BALLOON ANGIOPLASTY;  Surgeon: Cephus Shelling, MD;  Location: [facility] INVASIVE CV LAB;  Service: Cardiovascular;  Laterality: Right;  Radial   UPPER EXTREMITY ANGIOGRAPHY N/A 06/25/2022   Procedure: Upper Extremity Angiography;  Surgeon: Cephus Shelling, MD;  Location: [facility] INVASIVE CV LAB;  Service: Cardiovascular;  Laterality: N/A;   Family History History reviewed. No pertinent family history. Social History  reports that he has never smoked. He has never used smokeless tobacco. He reports that he does not drink alcohol and does not use drugs. Allergies  Allergies  Allergen Reactions   Gabapentin Other (See Comments)    Cognitive impairment per spouse   Lipitor [Atorvastatin] Other (See Comments)    Severe muscle cramps per spouse   Nsaids Other (See Comments)    Severe renal impairment per [facility]   Pregabalin Other (See Comments)    Cognitive impairment. Dizziness.   Home medications Prior to Admission medications   Medication Sig Start Date End Date Taking? Authorizing Provider  acetaminophen (TYLENOL) 500 MG tablet Take 1,000 mg by mouth every 8 (eight) hours as  needed for moderate pain or mild pain.    [provider]  albuterol (VENTOLIN HFA) 108 (90 Base) MCG/ACT inhaler Inhale 2 puffs into the lungs every 4 (four) hours as needed for wheezing or shortness of breath.    [provider]  aspirin EC 81 MG tablet Take 1 tablet (81 mg total) by mouth daily. Swallow whole. 07/02/22   Rodolph Bong, MD  buprenorphine (SUBUTEX) 2 MG SUBL SL tablet Place 1 tablet (2 mg total) under the tongue at bedtime. 07/01/22   Rodolph Bong, MD  busPIRone (BUSPAR) 10 MG tablet Take 1-2 tablets (10-20 mg  total) by mouth See admin instructions. Take 2 tablets in the AM and 1 tablet in the PM. Patient taking differently: Take 10-20 mg by mouth See admin instructions. Take 2 tablets by mouth in the morning and 1 tablet in the at bedtime 07/01/22   Rodolph Bong, MD  calcitRIOL (ROCALTROL) 0.25 MCG capsule Take 0.25 mcg by mouth daily.    [provider]  Calcium Acetate 667 MG TABS Take 1 tablet by mouth 3 (three) times daily.    [provider]  clopidogrel (PLAVIX) 75 MG tablet Take 1 tablet (75 mg total) by mouth daily. 07/02/22   Rodolph Bong, MD  Darbepoetin Alfa (ARANESP) 100 MCG/0.5ML SOSY injection Inject 0.5 mLs (100 mcg total) into the skin every Thursday at 6pm. 07/03/22   Rodolph Bong, MD  escitalopram (LEXAPRO) 20 MG tablet Take 20 mg by mouth daily.    [provider]  levothyroxine (SYNTHROID) 125 MCG tablet Take 250 mcg by mouth daily before breakfast.    [provider]  lidocaine (LMX) 4 % cream Apply 1 Application topically once a week. 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    [provider]  midodrine (PROAMATINE) 10 MG tablet Take 1 tablet (10 mg total) by mouth Every Tuesday,Thursday,and Saturday with dialysis. Patient taking differently: Take 10 mg by mouth Every Tuesday,Thursday,and Saturday with dialysis. May also give 1 tablet for sBP less than 90 07/03/22   Rodolph Bong, MD  oxyCODONE (OXY IR/ROXICODONE) 5 MG immediate release tablet Take 1 tablet (5 mg total) by mouth every 4 (four) hours as needed for moderate pain. 07/18/22   Hughie Closs, MD  pantoprazole (PROTONIX) 40 MG tablet Take 40 mg by mouth 2 (two) times daily. For GERD    [provider]  rosuvastatin (CRESTOR) 10 MG tablet Take 10 mg by mouth at bedtime.    [provider]     Vitals:   07/29/22 2344 07/30/22 6433 07/30/22 0443  07/30/22 0928  BP: (!) 164/97 (!) 151/106 (!) 178/78 135/63  Pulse: 75 73 79 73  Resp: 17  17 17   Temp:  98.4 F (36.9 C) 97.8 F (36.6 C) 98.6 F (37 C)  TempSrc:  Oral  Oral  SpO2: 98% 96% 95% 96%  Weight:      Height:       Exam Gen alert, restless, having "gas pains", not in distress No rash, cyanosis or gangrene Sclera anicteric, throat clear  No jvd or bruits Chest clear bilat to bases, no rales/ wheezing RRR no MRG Abd soft ntnd no mass or ascites +bs GU normal male MS no joint effusions or deformity Ext no LE or UE edema, no wounds or ulcers Neuro is alert,  would not answer questions, looked to be in pain    L AVF +bruit      Home meds include - tylenol, albuterol, aspirin, buprenorphine, buspirone, rocaltrol 0.25 mcg every day, phoslo 1 ac, plavix, escitalopram, levothyroxine, emla cream, melatonin, midodrine 10mg  pre hd tts, oxyIR, pantoprazole, rosuvastatin     OP HD: [facility] Dr TTS  3.5h   99kg   350/700   AVF  Heparin 3700+ 500/hr - had 38 min of HD yest 7/23, stopped due to agitation - EPO 7600 q HD    Assessment/ Plan: AMS - unclear cause, some memory loss per the wife over the last 3 months. W/u in progress, oxy IR on hold. He is not uremic and doesn't miss HD sessions.  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 Has minimal HD yesterday, but pt is agitated and labs/ volume look good.  Will hold off on HD today. Plan HD tomorrow.   HTN/ volume - BP's a bit high, no vol excess on exam. Wt's are well under dry wt. Get standing wt, no UF next HD.  Anemia esrd - Hb 10 range, no esa needs. Gets EPO at OP unit. Follow.  MBD ckd - CCa ~10, phos is in range. Continue po vdra and phoslo as binder.   Vinson Moselle  MD [facility] 07/30/2022, 10:51 AM  Recent Labs  Lab 07/29/22 1438 07/30/22 0244 07/30/22 0814  HGB 10.7* 10.9* 9.7*  ALBUMIN 2.9*  --  2.6*  CALCIUM 9.4  --  8.9  PHOS  --   --  5.2*  CREATININE 5.01*  --  5.85*  K 4.6 4.7 4.8   Inpatient medications:   aspirin EC  81 mg Oral Daily   calcitRIOL  0.25 mcg Oral Daily   calcium acetate  667 mg Oral TID WC   clopidogrel  75 mg Oral Daily   glucagon (human recombinant)  1 mg Intramuscular STAT   levothyroxine  250 mcg Oral Q0600   [START ON 07/31/2022] midodrine  10 mg Oral Q T,Th,Sa-HD   pantoprazole  40 mg Oral BID   rosuvastatin  10 mg Oral QHS   sodium chloride flush  3 mL Intravenous Q12H    sodium chloride     sodium chloride, acetaminophen **OR** acetaminophen, albuterol, ondansetron **OR** ondansetron (ZOFRAN) IV, sodium chloride flush

## 2022-07-30 NOTE — Progress Notes (Signed)
PROGRESS NOTE    Chase [name]  VWU:[id number] DOB: 15-Jan-1950 DOA: 07/29/2022 PCP: [name], MD    Brief Narrative:  72 y.o. male with medical history significant of  ESRD on HD TTS, CAD, hypothyroidism, hypertension, anemia of chronic disease, depression/anxiety, GERD, type 2 diabetes, chronic back and neck pain.  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 Admitted for acute encephalopathy.  Had seizure-like activity on 7/24.    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 -discussed with ortho-- no need for intervention currently   PAD continue  Plavix   DVT prophylaxis: SCDs Start: 07/30/22 0746    Code Status: Full Code Family Communication: at bedside  Disposition Plan:  Level of care: Progressive Status is: Observation The patient will require care spanning > 2 midnights and should be moved to inpatient     Consultants:  Renal neuro   Subjective: Called to bedside for seizure like activity  Objective: Vitals:   07/30/22 0928 07/30/22 1228 07/30/22 1229 07/30/22 1232  BP: 135/63 (!) 178/76  (!) 162/72  Pulse: 73 74    Resp: 17     Temp: 98.6 F (37 C) 98.9 F (37.2 C)    TempSrc: Oral Oral    SpO2: 96% (!) 85% 92% 98%  Weight:      Height:        Intake/Output Summary (Last 24 hours) at 07/30/2022 1357 Last data filed at 07/30/2022 0900 Gross per 24 hour  Intake 60 ml  Output --  Net 60 ml   Filed Weights    07/29/22 1424  Weight: 90.7 kg    Examination:   General: Appearance:    Obese male in no acute distress     Lungs:     respirations unlabored  Heart:    Normal heart rate..    MS:   Right fingers amputation noted.    Neurologic:   sleeping       Data Reviewed: I have personally reviewed following labs and imaging studies  CBC: Recent Labs  Lab 07/29/22 1438 07/30/22 0244 07/30/22 0814  WBC 7.4  --  6.3  NEUTROABS 5.6  --   --   HGB 10.7* 10.9* 9.7*  HCT 35.2* 32.0* 32.1*  MCV 98.6  --  95.3  PLT 183  --  154   Basic Metabolic Panel: Recent Labs  Lab 07/29/22 1438 07/30/22 0244 07/30/22 0814  NA 136 136 134*  K 4.6 4.7 4.8  CL 98  --  98  CO2 24  --  22  GLUCOSE 87  --  84  BUN 40*  --  46*  CREATININE 5.01*  --  5.85*  CALCIUM 9.4  --  8.9  MG  --   --  1.4*  PHOS  --   --  5.2*   GFR: Estimated Creatinine Clearance: 12.4 mL/min (A) (by C-G formula based on SCr of 5.85 mg/dL (H)). Liver Function Tests: Recent Labs  Lab 07/29/22  1438 07/30/22 0814  AST 13* 10*  ALT 7 7  ALKPHOS 116 99  BILITOT 0.8 0.5  PROT 6.9 6.2*  ALBUMIN 2.9* 2.6*   Recent Labs  Lab 07/29/22 1438  LIPASE 26   Recent Labs  Lab 07/29/22 1820  AMMONIA 20   Coagulation Profile: No results for input(s): "INR", "PROTIME" in the last 168 hours. Cardiac Enzymes: No results for input(s): "CKTOTAL", "CKMB", "CKMBINDEX", "TROPONINI" in the last 168 hours. BNP (last 3 results) No results for input(s): "PROBNP" in the last 8760 hours. HbA1C: No results for input(s): "HGBA1C" in the last 72 hours. CBG: Recent Labs  Lab 07/30/22 0445 07/30/22 0728 07/30/22 0913 07/30/22 1056 07/30/22 1233  GLUCAP 66* 67* 106* 82 74   Lipid Profile: No results for input(s): "CHOL", "HDL", "LDLCALC", "TRIG", "CHOLHDL", "LDLDIRECT" in the last 72 hours. Thyroid Function Tests: No results for input(s): "TSH", "T4TOTAL", "FREET4", "T3FREE", "THYROIDAB" in the last 72 hours. Anemia  Panel: Recent Labs    07/30/22 0130 07/30/22 0351  VITAMINB12 724  --   FOLATE  --  9.4   Sepsis Labs: Recent Labs  Lab 07/30/22 0200  LATICACIDVEN 0.6    Recent Results (from the past 240 hour(s))  SARS Coronavirus 2 by RT PCR (hospital order, performed in [facility] hospital lab) *cepheid single result test* Anterior Nasal Swab     Status: None   Collection Time: 07/30/22  1:30 AM   Specimen: Anterior Nasal Swab  Result Value Ref Range Status   SARS Coronavirus 2 by RT PCR NEGATIVE NEGATIVE Final    Comment: Performed at [facility], [address] [place], [place] [address]  MRSA Next Gen by PCR, Nasal     Status: None   Collection Time: 07/30/22  8:35 AM   Specimen: Nasal Mucosa; Nasal Swab  Result Value Ref Range Status   MRSA by PCR Next Gen NOT DETECTED NOT DETECTED Final    Comment: (NOTE) The GeneXpert MRSA Assay (FDA approved for NASAL specimens only), is one component of a comprehensive MRSA colonization surveillance program. It is not intended to diagnose MRSA infection nor to guide or monitor treatment for MRSA infections. Test performance is not FDA approved in patients less than 32 years old. Performed at [facility], [address] [place], [place] [address]          Radiology Studies: DG CHEST PORT 1 VIEW  Result Date: 07/30/2022 CLINICAL DATA:  [id number] Acute encephalopathy [id number] EXAM: PORTABLE CHEST 1 VIEW COMPARISON:  07/15/2022 FINDINGS: Cardiomegaly, vascular congestion. Continued small left pleural effusion with left lower lobe atelectasis or infiltrate, similar to prior study. Scarring in the right upper lobe. No acute bony abnormality. Spinal stimulator wires remain in stable position in the midthoracic spine. IMPRESSION: Small left pleural effusion with left lower lobe atelectasis or infiltrate, stable. Cardiomegaly, vascular congestion. Electronically Signed   By: Charlett Nose M.D.   On: 07/30/2022 02:04   US Abdomen Limited  RUQ (LIVER/GB)  Result Date: 07/29/2022 CLINICAL DATA:  Right upper quadrant abdominal pain. EXAM: ULTRASOUND ABDOMEN LIMITED RIGHT UPPER QUADRANT COMPARISON:  CT abdomen pelvis dated 07/26/2022. FINDINGS: Evaluation is limited due to overlying bowel gas. Gallbladder: Cholecystectomy. Common bile duct: Diameter: 14 mm. The common bile duct is dilated, likely post cholecystectomy. Liver: The liver is grossly unremarkable. Portal vein is patent on color Doppler imaging with normal direction of blood flow towards the liver. Other: None. IMPRESSION: Cholecystectomy, otherwise unremarkable right upper quadrant ultrasound. Electronically Signed  By: Elgie Collard M.D.   On: 07/29/2022 23:31   CT ABDOMEN PELVIS W CONTRAST  Result Date: 07/29/2022 CLINICAL DATA:  Acute abdominal pain EXAM: CT ABDOMEN AND PELVIS WITH CONTRAST TECHNIQUE: Multidetector CT imaging of the abdomen and pelvis was performed using the standard protocol following bolus administration of intravenous contrast. RADIATION DOSE REDUCTION: This exam was performed according to the departmental dose-optimization program which includes automated exposure control, adjustment of the mA and/or kV according to patient size and/or use of iterative reconstruction technique. CONTRAST:  75mL OMNIPAQUE IOHEXOL 350 MG/ML SOLN COMPARISON:  CT chest abdomen and pelvis 07/15/2022 FINDINGS: Lower chest: There is a chronic appearing small left pleural effusion with pleural thickening. Stable peripheral rounded parenchymal opacities are present favored as rounded atelectasis. The heart is enlarged. Hepatobiliary: Patient is status post cholecystectomy. There is intra and extrahepatic biliary ductal dilatation. The common bile duct measures 2.4 cm and contains hyperdense material similar to the prior study. No focal liver lesions are seen. Pancreas: Unremarkable. No pancreatic ductal dilatation or surrounding inflammatory changes. Spleen: Normal in size without  focal abnormality. Adrenals/Urinary Tract: Left kidney is absent. Bilateral adrenal glands are within normal limits. Mildly hyperdense lesion in the superior pole the right kidney measures 11 mm and appears unchanged. There subcentimeter cortical cysts similar to the prior study. There is no hydronephrosis or perinephric fat stranding. There is a calculus in the renal pelvis measuring 12 mm which appears unchanged. Right ureter and bladder are within normal limits. Stomach/Bowel: Stomach is within normal limits. Appendix appears normal. No evidence of bowel wall thickening, distention, or inflammatory changes. There is a large amount of stool throughout the colon. There is sigmoid colon diverticulosis. Vascular/Lymphatic: Extensive vascular calcifications are seen throughout the abdomen and pelvis as well as aorta. Aorta and IVC are normal in size. No enlarged lymph nodes are identified. Reproductive: Prostate is unremarkable. Other: No abdominal wall hernia or abnormality. No abdominopelvic ascites. Musculoskeletal: Previously identified intramuscular hematoma in the anterior left lower abdominal wall has decreased in size and now has the appearance of enhancing low-attenuation collections likely related to resolving hematomas. This area measures 7.6 by 2.5 by 2.0 cm. No acute hematomas are identified. Thoracic spinal cord stimulator device is present. Multilevel degenerative changes affect the spine. Chronic compression deformity of L2 is unchanged. IMPRESSION: 1. No acute localizing process in the abdomen or pelvis. 2. Stable intra and extrahepatic biliary ductal dilatation with hyperdense material in the common bile duct compatible with choledocholithiasis. 3. Stable nonobstructing right renal calculus. 4. Stable hyperdense lesion in the superior pole the right kidney, indeterminate. Recommend further evaluation with MRI or ultrasound. 5. Stable chronic small left pleural effusion with pleural thickening and  rounded atelectasis. 6. Left lower abdominal wall intramuscular hematoma has decreased in size and now has the appearance of enhancing low-attenuation collections likely related to resolving hematomas. Aortic Atherosclerosis (ICD10-I70.0). Electronically Signed   By: Darliss Cheney M.D.   On: 07/29/2022 18:26   CT Head Wo Contrast  Result Date: 07/29/2022 CLINICAL DATA:  Mental status change, unknown cause. EXAM: CT HEAD WITHOUT CONTRAST TECHNIQUE: Contiguous axial images were obtained from the base of the skull through the vertex without intravenous contrast. RADIATION DOSE REDUCTION: This exam was performed according to the departmental dose-optimization program which includes automated exposure control, adjustment of the mA and/or kV according to patient size and/or use of iterative reconstruction technique. COMPARISON:  CT head dated July 15, 2022 FINDINGS: Brain: No evidence of acute infarction, hemorrhage, hydrocephalus,  extra-axial collection or mass lesion/mass effect. Prominence of the ventricles and sulci secondary to moderate cerebral atrophy. Diffuse low-attenuation of the periventricular white matter presumed advanced chronic microvascular ischemic changes. Vascular: No hyperdense vessel or unexpected calcification. Skull: Normal. Negative for fracture or focal lesion. Sinuses/Orbits: No acute finding. Other: None. IMPRESSION: 1. No acute intracranial abnormality. 2. Moderate cerebral atrophy and advanced chronic microvascular ischemic changes of the periventricular white matter. Electronically Signed   By: Larose Hires D.O.   On: 07/29/2022 18:22        Scheduled Meds:  aspirin EC  81 mg Oral Daily   calcitRIOL  0.25 mcg Oral Daily   calcium acetate  667 mg Oral TID WC   Chlorhexidine Gluconate Cloth  6 each Topical Q0600   clopidogrel  75 mg Oral Daily   glucagon (human recombinant)  1 mg Intramuscular STAT   levothyroxine  250 mcg Oral Q0600   [START ON 07/31/2022] midodrine  10 mg  Oral Q T,Th,Sa-HD   pantoprazole  40 mg Oral BID   rosuvastatin  10 mg Oral QHS   sodium chloride flush  3 mL Intravenous Q12H   Continuous Infusions:  sodium chloride     levETIRAcetam     [START ON 07/31/2022] levETIRAcetam       LOS: 0 days    Time spent: 45 minutes spent on chart review, discussion with nursing staff, consultants, updating family and interview/physical exam; more than 50% of that time was spent in counseling and/or coordination of care.    Joseph Art, DO [facility] Available via Epic secure chat 7am-7pm After these hours, please refer to coverage provider listed on [email or web address] 07/30/2022, 1:57 PM

## 2022-07-30 NOTE — Progress Notes (Signed)
OT Cancellation Note  Patient Details Name: Chase [name] MRN: [id number] DOB: 1950-10-13   Cancelled Treatment:    Reason Eval/Treat Not Completed: Medical issues which prohibited therapy (Pt with seizure like activity, transferring to higher level of care.Will follow.)  Evern Bio 07/30/2022, 2:09 PM Berna Spare, OTR/L Acute Rehabilitation Services Office: [phone]

## 2022-07-30 NOTE — Progress Notes (Signed)
Initial Nutrition Assessment  DOCUMENTATION CODES:   Not applicable  INTERVENTION:   RD informed patient's wife about availability of ONS on unit. RD to not order ONS at this time due to patient not being awake enough to eat or drink   Patient may benefit from Cortrak tube placement if mental status does not improve    NUTRITION DIAGNOSIS:   Inadequate oral intake related to inability to eat as evidenced by other (comment) (mental status).   GOAL:   Patient will meet greater than or equal to 90% of their needs   MONITOR:   PO intake, Labs, Weight trends, Skin, I & O's  REASON FOR ASSESSMENT:   Consult Assessment of nutrition requirement/status  ASSESSMENT:   72 y/o male with PMHx including ESRD on HD TTS, CAD, hypothyroidism, HTN, anemia of chronic disease, depression/anxiety, GERD, T2DM, chronic back pain and neck pain, dry gangrene s/p right ring and index finger amputation 7/11 who presents with abdominal pain and confusion  Visited patient at bedside who was post ictal due to seizure shortly before RD's arrival.   Patient's wife reports declining mental status. She states he has not been alert enough since admission to eat or drink. RD informed patient's wife about possible need for Cortrak NG tube placement for TF's if patient's po intake does not improve in the next 2-3 days.   Patient's wife reports good appetite and good po intake at baseline which he was at PTA.  Labs: Na 134, BUN 46, Cr 5.85, phos 5.2, mag 1.4  Meds: rocalcitriol, phoslo, plavix, synthroid, protonix, crestor, NS  Wt: stable? 07/29/22 90.7 kg  07/28/22 90.7 kg  07/17/22 84 kg  07/01/22 90.5 kg  12/15/21 90.7 kg   PO: 0-20% x 2 documented meals  I/O's: +60 ml, no UOP   NUTRITION - FOCUSED PHYSICAL EXAM:  Flowsheet Row Most Recent Value  Orbital Region No depletion  Upper Arm Region No depletion  Thoracic and Lumbar Region No depletion  Buccal Region No depletion  Temple Region No  depletion  Clavicle Bone Region No depletion  Clavicle and Acromion Bone Region No depletion  Scapular Bone Region No depletion  Dorsal Hand No depletion  Patellar Region No depletion  Anterior Thigh Region No depletion  Posterior Calf Region No depletion  Edema (RD Assessment) None  Hair Reviewed  Eyes Unable to assess  Mouth Unable to assess  Skin Reviewed  Nails Unable to assess       Diet Order:   Diet Order             DIET DYS 3 Room service appropriate? Yes with Assist; Fluid consistency: Thin; Fluid restriction: 1200 mL Fluid  Diet effective now                   EDUCATION NEEDS:   Education needs have been addressed  Skin:  Skin Assessment: Skin Integrity Issues: Skin Integrity Issues:: Other (Comment) Other: gangrene on R finger  Last BM:  unknown  Height:   Ht Readings from Last 1 Encounters:  07/29/22 5\' 6"  (1.676 m)    Weight:   Wt Readings from Last 1 Encounters:  07/29/22 90.7 kg    Ideal Body Weight:     BMI:  Body mass index is 32.28 kg/m.  Estimated Nutritional Needs:   Kcal:  1820-2000  Protein:  110-135 g  Fluid:  >/= 2L   Leodis Rains, RDN, LDN  Clinical Nutrition

## 2022-07-30 NOTE — Assessment & Plan Note (Signed)
-   most likely multifactorial secondary to  polypharmacy and underlining dementia    - Hold contributing medications   -  unable to have MRI of the brain   - neurological exam appears to be nonfocal but patient unable to cooperate fully  Noted tremor and pinpoint pupils Hold off on oxycodone  - VBG ordered   - no history of liver disease ammonia unremarkable

## 2022-07-30 NOTE — Assessment & Plan Note (Signed)
Dr Signe Colt with Nephrology was made aware May need to dialyze tomorrow

## 2022-07-30 NOTE — Assessment & Plan Note (Signed)
Status post amputation PAD continue Plavix

## 2022-07-30 NOTE — Assessment & Plan Note (Signed)
Check TSH Continue synthroid at home dose

## 2022-07-30 NOTE — Progress Notes (Signed)
New Admission Note:   Arrival Method: stretcher Mental Orientation: confused x 4 Telemetry: yes  Assessment: Completed Skin: see flow sheet IV: NSL Pain: none Tubes: none Safety Measures: Safety Fall Prevention Plan has been discussed Admission: Completed [facility] Orientation: Patient has been orientated to the room, unit and staff.  Family: none at bedside  Orders have been reviewed and implemented. Will continue to monitor the patient. Call light has been placed within reach and bed alarm has been activated.   Artemio Aly BSN, RN Phone number: [phone]

## 2022-07-30 NOTE — ED Notes (Signed)
ED TO INPATIENT HANDOFF REPORT  ED Nurse Name and Phone #:   Gillis Ends [phone]  S Name/Age/Gender Chase [name] 72 y.o. male Room/Bed: H020C/H020C  Code Status   Code Status: Full Code  Home/SNF/Other Home Patient oriented to: self Is this baseline? Yes   Triage Complete: Triage complete  Chief Complaint Acute encephalopathy [G93.40]  Triage Note Per [facility] pt coming from dialysis c/o abdominal pain. States staff would not complete treatment due to c/o pain. Patient has access to left arm for treatment. Patient had surgery on left hand last month and will not allow BP to be taken on arms.    Allergies Allergies  Allergen Reactions   Gabapentin     Cognitive impairment per spouse   Lipitor [Atorvastatin]     Severe muscle cramps per spouse   Nsaids Other (See Comments)    Severe renal impairment per [facility]   Pregabalin     Cognitive impairment. Dizziness.    Level of Care/Admitting Diagnosis ED Disposition     ED Disposition  Admit   Condition  --   Comment  Hospital Area: [facility] [[id number]]  Level of Care: Telemetry Medical [104]  May place patient in observation at [facility] or [facility] if equivalent level of care is available:: No  Covid Evaluation: Asymptomatic - no recent exposure (last 10 days) testing not required  Diagnosis: Acute encephalopathy [841324]  Admitting Physician: Therisa Doyne [[id number]]  Attending Physician: Therisa Doyne [[id number]]          B Medical/Surgery History Past Medical History:  Diagnosis Date   Anemia    Back pain with radiation    Coronary artery disease    Depression    Diabetes mellitus without complication (HCC)    Dialysis patient (HCC)    M, W, F   History of traumatic head injury    Hypertension    Renal disorder    Thyroid disease    Past Surgical History:  Procedure Laterality Date   AMPUTATION Right 07/17/2022   Procedure: AMPUTATION OF RIGHT RING FINGER AND PARTIAL  AMPUTATION OF RIGHT INDEX FINGER;  Surgeon: Gomez Cleverly, MD;  Location: [facility] OR;  Service: Orthopedics;  Laterality: Right;   CAPD REMOVAL N/A 06/30/2022   Procedure: REMOVAL CONTINUOUS AMBULATORY PERITONEAL DIALYSIS  (CAPD) CATHETER;  Surgeon: Victorino Sparrow, MD;  Location: [facility] OR;  Service: Vascular;  Laterality: N/A;   ERCP     PERIPHERAL VASCULAR BALLOON ANGIOPLASTY Right 06/25/2022   Procedure: PERIPHERAL VASCULAR BALLOON ANGIOPLASTY;  Surgeon: Cephus Shelling, MD;  Location: [facility] INVASIVE CV LAB;  Service: Cardiovascular;  Laterality: Right;  Radial   UPPER EXTREMITY ANGIOGRAPHY N/A 06/25/2022   Procedure: Upper Extremity Angiography;  Surgeon: Cephus Shelling, MD;  Location: [facility] INVASIVE CV LAB;  Service: Cardiovascular;  Laterality: N/A;     A IV Location/Drains/Wounds Patient Lines/Drains/Airways Status     Active Line/Drains/Airways     Name Placement date Placement time Site Days   Peripheral IV 07/29/22 20 G Anterior;Right;Distal Forearm 07/29/22  1721  Forearm  1   Fistula / Graft Left Forearm 10/25/20  0500  Forearm  643   Wound / Incision (Open or Dehisced) 06/23/22 Other (Comment) Finger (Comment which one) Anterior;Right 06/23/22  2100  Finger (Comment which one)  37   Wound / Incision (Open or Dehisced) 07/16/22 Other (Comment) Thigh Distal;Left;Posterior 07/16/22  0415  Thigh  14            Intake/Output Last 24  hours No intake or output data in the 24 hours ending 07/30/22 0158  Labs/Imaging Results for orders placed or performed during the hospital encounter of 07/29/22 (from the past 48 hour(s))  Comprehensive metabolic panel     Status: Abnormal   Collection Time: 07/29/22  2:38 PM  Result Value Ref Range   Sodium 136 135 - 145 mmol/L   Potassium 4.6 3.5 - 5.1 mmol/L   Chloride 98 98 - 111 mmol/L   CO2 24 22 - 32 mmol/L   Glucose, Bld 87 70 - 99 mg/dL    Comment: Glucose reference range applies only to samples taken after fasting for at least 8  hours.   BUN 40 (H) 8 - 23 mg/dL   Creatinine, Ser 8.11 (H) 0.61 - 1.24 mg/dL   Calcium 9.4 8.9 - 91.4 mg/dL   Total Protein 6.9 6.5 - 8.1 g/dL   Albumin 2.9 (L) 3.5 - 5.0 g/dL   AST 13 (L) 15 - 41 U/L   ALT 7 0 - 44 U/L   Alkaline Phosphatase 116 38 - 126 U/L   Total Bilirubin 0.8 0.3 - 1.2 mg/dL   GFR, Estimated 12 (L) >60 mL/min    Comment: (NOTE) Calculated using the CKD-EPI Creatinine Equation (2021)    Anion gap 14 5 - 15    Comment: Performed at [facility], [address] [place], [place] [address]  Lipase, blood     Status: None   Collection Time: 07/29/22  2:38 PM  Result Value Ref Range   Lipase 26 11 - 51 U/L    Comment: Performed at [facility], [address], [place], [place] [address]  CBC with Diff     Status: Abnormal   Collection Time: 07/29/22  2:38 PM  Result Value Ref Range   WBC 7.4 4.0 - 10.5 K/uL   RBC 3.57 (L) 4.22 - 5.81 MIL/uL   Hemoglobin 10.7 (L) 13.0 - 17.0 g/dL   HCT 86.5 (L) 78.4 - 69.6 %   MCV 98.6 80.0 - 100.0 fL   MCH 30.0 26.0 - 34.0 pg   MCHC 30.4 30.0 - 36.0 g/dL   RDW 29.5 28.4 - 13.2 %   Platelets 183 150 - 400 K/uL   nRBC 0.0 0.0 - 0.2 %   Neutrophils Relative % 76 %   Neutro Abs 5.6 1.7 - 7.7 K/uL   Lymphocytes Relative 14 %   Lymphs Abs 1.0 0.7 - 4.0 K/uL   Monocytes Relative 6 %   Monocytes Absolute 0.5 0.1 - 1.0 K/uL   Eosinophils Relative 3 %   Eosinophils Absolute 0.2 0.0 - 0.5 K/uL   Basophils Relative 1 %   Basophils Absolute 0.1 0.0 - 0.1 K/uL   Immature Granulocytes 0 %   Abs Immature Granulocytes 0.03 0.00 - 0.07 K/uL    Comment: Performed at [facility], [address], [place], [place] [address]  Troponin I (High Sensitivity)     Status: None   Collection Time: 07/29/22  2:38 PM  Result Value Ref Range   Troponin I (High Sensitivity) 17 <18 ng/L    Comment: (NOTE) Elevated high sensitivity troponin I (hsTnI) values and significant  changes across serial measurements may suggest ACS but  many other  chronic and acute conditions are known to elevate hsTnI results.  Refer to the "Links" section for chest pain algorithms and additional  guidance. Performed at [facility], [address], [place], [place]  [address]   Troponin I (High Sensitivity)     Status: Abnormal   Collection Time: 07/29/22  5:20 PM  Result Value Ref Range   Troponin I (High Sensitivity) 19 (H) <18 ng/L    Comment: (NOTE) Elevated high sensitivity troponin I (hsTnI) values and significant  changes across serial measurements may suggest ACS but many other  chronic and acute conditions are known to elevate hsTnI results.  Refer to the "Links" section for chest pain algorithms and additional  guidance. Performed at [facility], [address], [place], [place] [address]   Ammonia     Status: None   Collection Time: 07/29/22  6:20 PM  Result Value Ref Range   Ammonia 20 9 - 35 umol/L    Comment: Performed at [facility], [address], [place], [place] [address]  CBG monitoring, ED     Status: None   Collection Time: 07/30/22 12:00 AM  Result Value Ref Range   Glucose-Capillary 74 70 - 99 mg/dL    Comment: Glucose reference range applies only to samples taken after fasting for at least 8 hours.   US Abdomen Limited RUQ (LIVER/GB)  Result Date: 07/29/2022 CLINICAL DATA:  Right upper quadrant abdominal pain. EXAM: ULTRASOUND ABDOMEN LIMITED RIGHT UPPER QUADRANT COMPARISON:  CT abdomen pelvis dated 07/26/2022. FINDINGS: Evaluation is limited due to overlying bowel gas. Gallbladder: Cholecystectomy. Common bile duct: Diameter: 14 mm. The common bile duct is dilated, likely post cholecystectomy. Liver: The liver is grossly unremarkable. Portal vein is patent on color Doppler imaging with normal direction of blood flow towards the liver. Other: None. IMPRESSION: Cholecystectomy, otherwise unremarkable right upper quadrant ultrasound. Electronically Signed   By: Elgie Collard M.D.   On:  07/29/2022 23:31   CT ABDOMEN PELVIS W CONTRAST  Result Date: 07/29/2022 CLINICAL DATA:  Acute abdominal pain EXAM: CT ABDOMEN AND PELVIS WITH CONTRAST TECHNIQUE: Multidetector CT imaging of the abdomen and pelvis was performed using the standard protocol following bolus administration of intravenous contrast. RADIATION DOSE REDUCTION: This exam was performed according to the departmental dose-optimization program which includes automated exposure control, adjustment of the mA and/or kV according to patient size and/or use of iterative reconstruction technique. CONTRAST:  75mL OMNIPAQUE IOHEXOL 350 MG/ML SOLN COMPARISON:  CT chest abdomen and pelvis 07/15/2022 FINDINGS: Lower chest: There is a chronic appearing small left pleural effusion with pleural thickening. Stable peripheral rounded parenchymal opacities are present favored as rounded atelectasis. The heart is enlarged. Hepatobiliary: Patient is status post cholecystectomy. There is intra and extrahepatic biliary ductal dilatation. The common bile duct measures 2.4 cm and contains hyperdense material similar to the prior study. No focal liver lesions are seen. Pancreas: Unremarkable. No pancreatic ductal dilatation or surrounding inflammatory changes. Spleen: Normal in size without focal abnormality. Adrenals/Urinary Tract: Left kidney is absent. Bilateral adrenal glands are within normal limits. Mildly hyperdense lesion in the superior pole the right kidney measures 11 mm and appears unchanged. There subcentimeter cortical cysts similar to the prior study. There is no hydronephrosis or perinephric fat stranding. There is a calculus in the renal pelvis measuring 12 mm which appears unchanged. Right ureter and bladder are within normal limits. Stomach/Bowel: Stomach is within normal limits. Appendix appears normal. No evidence of bowel wall thickening, distention, or inflammatory changes. There is a large amount of stool throughout the colon. There is  sigmoid colon diverticulosis. Vascular/Lymphatic: Extensive vascular calcifications are seen throughout the abdomen and pelvis as well as aorta. Aorta and  IVC are normal in size. No enlarged lymph nodes are identified. Reproductive: Prostate is unremarkable. Other: No abdominal wall hernia or abnormality. No abdominopelvic ascites. Musculoskeletal: Previously identified intramuscular hematoma in the anterior left lower abdominal wall has decreased in size and now has the appearance of enhancing low-attenuation collections likely related to resolving hematomas. This area measures 7.6 by 2.5 by 2.0 cm. No acute hematomas are identified. Thoracic spinal cord stimulator device is present. Multilevel degenerative changes affect the spine. Chronic compression deformity of L2 is unchanged. IMPRESSION: 1. No acute localizing process in the abdomen or pelvis. 2. Stable intra and extrahepatic biliary ductal dilatation with hyperdense material in the common bile duct compatible with choledocholithiasis. 3. Stable nonobstructing right renal calculus. 4. Stable hyperdense lesion in the superior pole the right kidney, indeterminate. Recommend further evaluation with MRI or ultrasound. 5. Stable chronic small left pleural effusion with pleural thickening and rounded atelectasis. 6. Left lower abdominal wall intramuscular hematoma has decreased in size and now has the appearance of enhancing low-attenuation collections likely related to resolving hematomas. Aortic Atherosclerosis (ICD10-I70.0). Electronically Signed   By: Darliss Cheney M.D.   On: 07/29/2022 18:26   CT Head Wo Contrast  Result Date: 07/29/2022 CLINICAL DATA:  Mental status change, unknown cause. EXAM: CT HEAD WITHOUT CONTRAST TECHNIQUE: Contiguous axial images were obtained from the base of the skull through the vertex without intravenous contrast. RADIATION DOSE REDUCTION: This exam was performed according to the departmental dose-optimization program which  includes automated exposure control, adjustment of the mA and/or kV according to patient size and/or use of iterative reconstruction technique. COMPARISON:  CT head dated July 15, 2022 FINDINGS: Brain: No evidence of acute infarction, hemorrhage, hydrocephalus, extra-axial collection or mass lesion/mass effect. Prominence of the ventricles and sulci secondary to moderate cerebral atrophy. Diffuse low-attenuation of the periventricular white matter presumed advanced chronic microvascular ischemic changes. Vascular: No hyperdense vessel or unexpected calcification. Skull: Normal. Negative for fracture or focal lesion. Sinuses/Orbits: No acute finding. Other: None. IMPRESSION: 1. No acute intracranial abnormality. 2. Moderate cerebral atrophy and advanced chronic microvascular ischemic changes of the periventricular white matter. Electronically Signed   By: Larose Hires D.O.   On: 07/29/2022 18:22   VAS [place] UPPER EXTREMITY ARTERIAL DUPLEX  Result Date: 07/28/2022  UPPER EXTREMITY DUPLEX STUDY Patient Name:  EREZ MCCALLUM  Date of Exam:   07/28/2022 Medical Rec #: [id number]              Accession #:    [id number] Date of Birth: Jun 20, 1950              Patient Gender: M Patient Age:   43 years Exam Location:  [facility] Procedure:      VAS [place] UPPER EXTREMITY ARTERIAL DUPLEX Referring Phys: Emilie Rutter --------------------------------------------------------------------------------  Indications: Pain.  Risk Factors:  Hypertension, Diabetes, coronary artery disease. Other Factors: 06/25/22- Procedure Performed:                1. Ultrasound-guided access right common femoral artery                2. Limited arch aortogram                3. Catheter selection of right axillary artery with dedicated                right upper extremity arteriogram and catheter selection of right  radial artery                4. Right radial artery angioplasty (2.5 mm x 80 mm [place]) for                 high grade 90% stenosis                5. Mynx closure of the right common femoral artery                07/17/22- AMPUTATION OF RIGHT RING FINGER AND PARTIAL AMPUTATION                OF RIGHT INDEX FINGER Performing Technologist: [place] Ponder RVS  Examination Guidelines: A complete evaluation includes B-mode imaging, spectral Doppler, color Doppler, and power Doppler as needed of all accessible portions of each vessel. Bilateral testing is considered an integral part of a complete examination. Limited examinations for reoccurring indications may be performed as noted.  Right Doppler Findings: +--------------+----------+----------+--------+--------+ Site          PSV (cm/s)Waveform  StenosisComments +--------------+----------+----------+--------+--------+ Subclavian Mid100       biphasic                   +--------------+----------+----------+--------+--------+ Axillary      101       biphasic                   +--------------+----------+----------+--------+--------+ Brachial Prox 128       triphasic                  +--------------+----------+----------+--------+--------+ Brachial Mid  124       triphasic                  +--------------+----------+----------+--------+--------+ Brachial Dist 110       triphasic                  +--------------+----------+----------+--------+--------+ Radial Prox   86        monophasic                 +--------------+----------+----------+--------+--------+ Radial Mid    34        monophasic                 +--------------+----------+----------+--------+--------+ Radial Dist   34        monophasic                 +--------------+----------+----------+--------+--------+ Ulnar Prox    73        biphasic                   +--------------+----------+----------+--------+--------+ Ulnar Mid     44        monophasic                 +--------------+----------+----------+--------+--------+ Ulnar Dist    27         monophasic                 +--------------+----------+----------+--------+--------+    Summary:  Right: No obstruction visualized in the right upper extremity. *See table(s) above for measurements and observations. Electronically signed by Heath Lark on 07/28/2022 at 11:51:59 AM.    Final     Pending Labs Unresulted Labs (From admission, onward)     Start     Ordered   07/30/22 0500  Vitamin B12  Tomorrow morning,   R        07/30/22 0118  07/30/22 0500  Folate  Tomorrow morning,   R        07/30/22 0119   07/29/22 2219  SARS Coronavirus 2 by RT PCR (hospital order, performed in [facility] hospital lab) *cepheid single result test* Anterior Nasal Swab  (Tier 2 - SARS Coronavirus 2 by RT PCR (hospital order, performed in [facility] hospital lab) *cepheid single result test*)  Once,   URGENT        07/29/22 2218   07/29/22 2122  Urinalysis, Routine w reflex microscopic -Urine, Clean Catch  Once,   URGENT       Question:  Specimen Source  Answer:  Urine, Clean Catch   07/29/22 2121   Signed and Held  Magnesium  Tomorrow morning,   R        Signed and Held   Signed and Held  Phosphorus  Tomorrow morning,   R        Signed and Held   Signed and Held  Comprehensive metabolic panel  Tomorrow morning,   R       Question:  Release to patient  Answer:  Immediate   Signed and Held   Signed and Held  CBC  Tomorrow morning,   R       Question:  Release to patient  Answer:  Immediate   Signed and Held            Vitals/Pain Today's Vitals   07/29/22 1419 07/29/22 1424 07/29/22 1752 07/29/22 2344  BP:  (!) 157/67 (!) 158/69 (!) 164/97  Pulse:  79 66 75  Resp:  18 16 17   Temp:  98.3 F (36.8 C) 98.6 F (37 C)   TempSrc:  Oral Oral   SpO2: 97% 93% 96% 98%  Weight:  90.7 kg    Height:  5\' 6"  (1.676 m)      Isolation Precautions Airborne and Contact precautions  Medications Medications  iohexol (OMNIPAQUE) 350 MG/ML injection 75 mL (75 mLs Intravenous Contrast Given 07/29/22  1741)    Mobility non-ambulatory     Focused Assessments     R Recommendations: See Admitting Provider Note  Report given to:   Additional Notes:

## 2022-07-30 NOTE — Evaluation (Signed)
Physical Therapy Evaluation Patient Details Name: Chase [name] MRN: [id number] DOB: Aug 21, 1950 Today's Date: 07/30/2022  History of Present Illness  72 y.o. male presenting to [facility] from dialysis 07/29/2022 with abdominal pain and confusion. 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 PMH includes back pain, depression, CAD, DM, CKD, HTN, SDH, ESRD.  Clinical Impression  Pt admitted with above diagnosis. Pt presents with impaired cognition, balance, weakness, and decreased activity tolerance. Pt requiring min-mod assist for transferring to chair using upright walker. Unable to ambulate despite multimodal cueing and facilitation for stepping initiation/progression. Patient will benefit from continued inpatient follow up therapy, <3 hours/day.       Assistance Recommended at Discharge Frequent or constant Supervision/Assistance  If plan is discharge home, recommend the following:  Can travel by private vehicle  A lot of help with walking and/or transfers;A lot of help with bathing/dressing/bathroom;Assistance with cooking/housework;Assistance with feeding;Direct supervision/assist for medications management;Direct supervision/assist for financial management;Assist for transportation;Help with stairs or ramp for entrance   No    Equipment Recommendations Other (comment) (defer)  Recommendations for Other Services       Functional Status Assessment Patient has had a recent decline in their functional status and demonstrates the ability to make significant improvements in function in a reasonable and predictable amount of time.     Precautions / Restrictions Precautions Precautions: Fall Restrictions Weight Bearing Restrictions: Yes RUE Weight Bearing:  (NWB through 1st and 4th fingers)      Mobility  Bed Mobility Overal bed mobility: Needs Assistance Bed Mobility: Supine to Sit     Supine to sit: Mod assist     General bed mobility comments: ModA to  elevate trunk to upright positioning    Transfers Overall transfer level: Needs assistance Equipment used:  (stand up walker) Transfers: Sit to/from Stand, Bed to chair/wheelchair/BSC Sit to Stand: Min assist Stand pivot transfers: Mod assist         General transfer comment: MinA to rise from edge of bed. Pt with significant difficulty problem solving correct hand/arm placement on walker, despite hand over hand guidance. Attempted tactile cueing to facilitate stepping initiation, however, pt with "freezing" type episode. Brought up chair behind pt. Increased difficulty sequencing to transition to sitting, with extra time and multimodal cueing    Ambulation/Gait               General Gait Details: deferred due to confusion  Stairs            Wheelchair Mobility     Tilt Bed    Modified Rankin (Stroke Patients Only)       Balance Overall balance assessment: Needs assistance Sitting-balance support: Feet supported Sitting balance-Leahy Scale: Good     Standing balance support: Bilateral upper extremity supported, During functional activity, Reliant on assistive device for balance Standing balance-Leahy Scale: Poor                               Pertinent Vitals/Pain Pain Assessment Pain Assessment: Faces Faces Pain Scale: Hurts even more Pain Location: R hand Pain Descriptors / Indicators: Discomfort, Grimacing, Guarding Pain Intervention(s): Limited activity within patient's tolerance, Monitored during session    Home Living Family/patient expects to be discharged to:: Skilled nursing facility                   Additional Comments: [facility]    Prior Function Prior Level of Function : Needs  assist             Mobility Comments: Ambulating 90-100' with PT at SNF using RW per chart review from prior admission ADLs Comments: requires assist for ADL's     Hand Dominance   Dominant Hand: Right    Extremity/Trunk  Assessment   Upper Extremity Assessment Upper Extremity Assessment: Defer to OT evaluation    Lower Extremity Assessment Lower Extremity Assessment: Generalized weakness    Cervical / Trunk Assessment Cervical / Trunk Assessment: Kyphotic  Communication   Communication: Expressive difficulties (increased time)  Cognition Arousal/Alertness: Awake/alert Behavior During Therapy: Flat affect Overall Cognitive Status: History of cognitive impairments - at baseline                                 General Comments: Pt not oriented, significantly slowed and delayed response time if responds at all. Difficulty sequencing despite max multimodal cueing.        General Comments      Exercises     Assessment/Plan    PT Assessment Patient needs continued PT services  PT Problem List Decreased strength;Decreased range of motion;Decreased activity tolerance;Decreased balance;Decreased mobility;Decreased coordination;Decreased cognition;Cardiopulmonary status limiting activity;Impaired sensation;Decreased skin integrity;Pain;Decreased safety awareness;Decreased knowledge of precautions;Decreased knowledge of use of DME       PT Treatment Interventions DME instruction;Gait training;Functional mobility training;Therapeutic activities;Therapeutic exercise;Balance training;Cognitive remediation;Patient/family education;Neuromuscular re-education    PT Goals (Current goals can be found in the Care Plan section)  Acute Rehab PT Goals Patient Stated Goal: did not state PT Goal Formulation: Patient unable to participate in goal setting Time For Goal Achievement: 08/13/22 Potential to Achieve Goals: Fair    Frequency Min 1X/week     Co-evaluation               AM-PAC PT "6 Clicks" Mobility  Outcome Measure Help needed turning from your back to your side while in a flat bed without using bedrails?: A Little Help needed moving from lying on your back to sitting on the  side of a flat bed without using bedrails?: A Lot Help needed moving to and from a bed to a chair (including a wheelchair)?: A Lot Help needed standing up from a chair using your arms (e.g., wheelchair or bedside chair)?: A Lot Help needed to walk in hospital room?: Total Help needed climbing 3-5 steps with a railing? : Total 6 Click Score: 11    End of Session Equipment Utilized During Treatment: Gait belt Activity Tolerance: Other (comment) (limited by confusion) Patient left: in chair;with call bell/phone within reach;with chair alarm set Nurse Communication: Mobility status PT Visit Diagnosis: Other abnormalities of gait and mobility (R26.89);Unsteadiness on feet (R26.81)    Time: 7824-2353 PT Time Calculation (min) (ACUTE ONLY): 24 min   Charges:   PT Evaluation $PT Eval Moderate Complexity: 1 Mod PT Treatments $Therapeutic Activity: 8-22 mins PT General Charges $$ ACUTE PT VISIT: 1 Visit         Lillia Pauls, PT, DPT Acute Rehabilitation Services Office [phone]   Norval Morton 07/30/2022, 10:53 AM

## 2022-07-31 ENCOUNTER — Inpatient Hospital Stay (HOSPITAL_COMMUNITY): Payer: Medicare Other

## 2022-07-31 DIAGNOSIS — N186 End stage renal disease: Secondary | ICD-10-CM | POA: Diagnosis not present

## 2022-07-31 DIAGNOSIS — I96 Gangrene, not elsewhere classified: Secondary | ICD-10-CM | POA: Diagnosis not present

## 2022-07-31 DIAGNOSIS — G9340 Encephalopathy, unspecified: Secondary | ICD-10-CM | POA: Diagnosis not present

## 2022-07-31 DIAGNOSIS — R569 Unspecified convulsions: Secondary | ICD-10-CM | POA: Diagnosis not present

## 2022-07-31 DIAGNOSIS — E1169 Type 2 diabetes mellitus with other specified complication: Secondary | ICD-10-CM | POA: Diagnosis not present

## 2022-07-31 LAB — RENAL FUNCTION PANEL
Albumin: 2.5 g/dL — ABNORMAL LOW (ref 3.5–5.0)
Anion gap: 13 (ref 5–15)
BUN: 54 mg/dL — ABNORMAL HIGH (ref 8–23)
CO2: 22 mmol/L (ref 22–32)
Calcium: 8.7 mg/dL — ABNORMAL LOW (ref 8.9–10.3)
Chloride: 98 mmol/L (ref 98–111)
Creatinine, Ser: 6.57 mg/dL — ABNORMAL HIGH (ref 0.61–1.24)
GFR, Estimated: 8 mL/min — ABNORMAL LOW (ref >60)
Glucose, Bld: 146 mg/dL — ABNORMAL HIGH (ref 70–99)
Phosphorus: 6.5 mg/dL — ABNORMAL HIGH (ref 2.5–4.6)
Potassium: 4.8 mmol/L (ref 3.5–5.1)
Sodium: 133 mmol/L — ABNORMAL LOW (ref 135–145)

## 2022-07-31 LAB — GLUCOSE, CAPILLARY
Glucose-Capillary: 129 mg/dL — ABNORMAL HIGH (ref 70–99)
Glucose-Capillary: 91 mg/dL (ref 70–99)
Glucose-Capillary: 59 mg/dL — ABNORMAL LOW (ref 70–99)
Glucose-Capillary: 128 mg/dL — ABNORMAL HIGH (ref 70–99)
Glucose-Capillary: 112 mg/dL — ABNORMAL HIGH (ref 70–99)
Glucose-Capillary: 81 mg/dL (ref 70–99)
Glucose-Capillary: 74 mg/dL (ref 70–99)
Glucose-Capillary: 69 mg/dL — ABNORMAL LOW (ref 70–99)
Glucose-Capillary: 101 mg/dL — ABNORMAL HIGH (ref 70–99)

## 2022-07-31 LAB — RPR: RPR Ser Ql: NONREACTIVE

## 2022-07-31 LAB — CBC
HCT: 30.6 % — ABNORMAL LOW (ref 39.0–52.0)
Hemoglobin: 9.4 g/dL — ABNORMAL LOW (ref 13.0–17.0)
MCH: 29.8 pg (ref 26.0–34.0)
MCHC: 30.7 g/dL (ref 30.0–36.0)
Platelets: 148 10*3/uL — ABNORMAL LOW (ref 150–400)
RBC: 3.15 MIL/uL — ABNORMAL LOW (ref 4.22–5.81)
RDW: 15.1 % (ref 11.5–15.5)
WBC: 13.3 10*3/uL — ABNORMAL HIGH (ref 4.0–10.5)
nRBC: 0 % (ref 0.0–0.2)

## 2022-07-31 LAB — CULTURE, BLOOD (ROUTINE X 2)
Culture: NO GROWTH
Culture: NO GROWTH
Special Requests: ADEQUATE

## 2022-07-31 LAB — HIV ANTIBODY (ROUTINE TESTING W REFLEX): HIV Screen 4th Generation wRfx: NONREACTIVE

## 2022-07-31 MED ORDER — DARBEPOETIN ALFA 60 MCG/0.3ML IJ SOSY
60.0000 ug | PREFILLED_SYRINGE | INTRAMUSCULAR | Status: DC
  Administered 2022-07-31 – 2022-08-07 (×2): 60 ug via SUBCUTANEOUS
  Filled 2022-07-31 (×2): qty 0.3

## 2022-07-31 MED ORDER — SODIUM CHLORIDE 0.9 % IV SOLN
12.5000 mg | Freq: Once | INTRAVENOUS | Status: AC
  Administered 2022-07-31: 12.5 mg via INTRAVENOUS
  Filled 2022-07-31: qty 0.5

## 2022-07-31 MED ORDER — HEPARIN SODIUM (PORCINE) 1000 UNIT/ML DIALYSIS
2000.0000 [IU] | INTRAMUSCULAR | Status: AC | PRN
  Administered 2022-07-31: 2000 [IU] via INTRAVENOUS_CENTRAL

## 2022-07-31 MED ORDER — OXYCODONE HCL 5 MG PO TABS: 5.0000 mg | ORAL_TABLET | Freq: Once | ORAL | Status: DC

## 2022-07-31 MED ORDER — GLUCAGON HCL RDNA (DIAGNOSTIC) 1 MG IJ SOLR
1.0000 mg | Freq: Once | INTRAMUSCULAR | Status: AC
  Administered 2022-07-31: 1 mg via INTRAMUSCULAR
  Filled 2022-07-31: qty 1

## 2022-07-31 MED ORDER — HALOPERIDOL LACTATE 5 MG/ML IJ SOLN: 5.0000 mg | Freq: Once | INTRAMUSCULAR | Status: DC

## 2022-07-31 MED ORDER — CHLORPROMAZINE HCL 25 MG/ML IJ SOLN
25.0000 mg | Freq: Once | INTRAMUSCULAR | Status: AC
  Administered 2022-07-31: 25 mg via INTRAMUSCULAR
  Filled 2022-07-31 (×2): qty 1

## 2022-07-31 MED ORDER — DEXTROSE 50 % IV SOLN: 25.0000 g | INTRAVENOUS | Status: AC

## 2022-07-31 MED ORDER — HEPARIN SODIUM (PORCINE) 5000 UNIT/ML IJ SOLN
5000.0000 [IU] | Freq: Three times a day (TID) | INTRAMUSCULAR | Status: DC
  Administered 2022-07-31 – 2022-08-10 (×28): 5000 [IU] via SUBCUTANEOUS
  Filled 2022-07-31 (×29): qty 1

## 2022-07-31 MED ORDER — VALPROATE SODIUM 100 MG/ML IV SOLN
1500.0000 mg | Freq: Once | INTRAVENOUS | Status: AC
  Administered 2022-07-31: 1500 mg via INTRAVENOUS
  Filled 2022-07-31: qty 15

## 2022-07-31 MED ORDER — SODIUM CHLORIDE 0.9 % IV SOLN
25.0000 mg | Freq: Once | INTRAVENOUS | Status: AC
  Administered 2022-08-01: 25 mg via INTRAVENOUS
  Filled 2022-07-31: qty 1

## 2022-07-31 MED ORDER — DEXTROSE 50 % IV SOLN
12.5000 g | INTRAVENOUS | Status: AC
  Administered 2022-07-31: 12.5 g via INTRAVENOUS
  Filled 2022-07-31: qty 50

## 2022-07-31 MED ORDER — HALOPERIDOL LACTATE 5 MG/ML IJ SOLN
5.0000 mg | Freq: Once | INTRAMUSCULAR | Status: AC
  Administered 2022-07-31: 5 mg via INTRAVENOUS
  Filled 2022-07-31: qty 1

## 2022-07-31 MED ORDER — HEPARIN SODIUM (PORCINE) 1000 UNIT/ML DIALYSIS
3700.0000 [IU] | Freq: Once | INTRAMUSCULAR | Status: AC
  Administered 2022-07-31: 3700 [IU] via INTRAVENOUS_CENTRAL
  Filled 2022-07-31: qty 4

## 2022-07-31 MED ORDER — VALPROATE SODIUM 100 MG/ML IV SOLN
500.0000 mg | Freq: Two times a day (BID) | INTRAVENOUS | Status: DC
  Administered 2022-07-31 – 2022-08-03 (×7): 500 mg via INTRAVENOUS
  Filled 2022-07-31 (×10): qty 5

## 2022-07-31 NOTE — Progress Notes (Signed)
PROGRESS NOTE    Chase [name]  RUE:[id number] DOB: 1950/07/18 DOA: 07/29/2022 PCP: [name], MD    Brief Narrative:  72 y.o. male with medical history significant of  ESRD on HD TTS, CAD, hypothyroidism, hypertension, anemia of chronic disease, depression/anxiety, GERD, type 2 diabetes, chronic back and neck pain.  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 Admitted for acute encephalopathy.  Had seizure-like activity on 7/24.    Assessment and Plan:  ESRD (end stage renal disease) (HCC) -nephrology consult   Suspected seizure -neuro consult, with neurology, with significant restlessness and agitation overnight, unclear if related to Keppra, but neurology will change to Depakote. 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 -Previous MD discussed with ortho-- no need for intervention currently -Will order bacitracin ointment   PAD continue  Plavix   DVT prophylaxis: SCDs Start: 07/30/22 0746    Code Status: Full Code Family Communication: none at bedside  Disposition Plan:  Level of care: Progressive Status is: Observation The patient will require care spanning > 2 midnights and should be moved to inpatient     Consultants:  Renal neuro   Subjective:  Patient with agitation and restlessness overnight, he received IV Thorazine  Objective: Vitals:   07/30/22 2200 07/31/22 0331 07/31/22 0332 07/31/22 0808  BP:  130/61 125/60 (!) 166/76  Pulse: 73 75 63 79  Resp: 15   20  Temp:  98.3 F (36.8 C)  97.8 F (36.6 C)  TempSrc:    Oral  SpO2:  93% 98% 98% (!) 88%  Weight:      Height:        Intake/Output Summary (Last 24 hours) at 07/31/2022 1018 Last data filed at 07/31/2022 0104 Gross per 24 hour  Intake 542 ml  Output --  Net 542 ml   Filed Weights   07/29/22 1424  Weight: 90.7 kg    Examination:   [place], altered, confused, cannot answer any questions appropriately or follow any commands Symmetrical Chest wall movement, Good air movement bilaterally, CTAB RRR,No Gallops,Rubs or new Murmurs, No Parasternal Heave +ve B.Sounds, Abd Soft, No tenderness, No rebound - guarding or rigidity. Right hand second and fourth digit amputation   Data Reviewed: I have personally reviewed following labs and imaging studies  CBC: Recent Labs  Lab 07/29/22 1438 07/30/22 0244 07/30/22 0814 07/31/22 0925  WBC 7.4  --  6.3 13.3*  NEUTROABS 5.6  --   --   --   HGB 10.7* 10.9* 9.7* 9.4*  HCT 35.2* 32.0* 32.1* 30.6*  MCV 98.6  --  95.3 97.1  PLT 183  --  154 148*   Basic Metabolic Panel: Recent Labs  Lab 07/29/22 1438 07/30/22 0244 07/30/22 0814 07/31/22 0925  NA 136 136 134* 133*  K 4.6 4.7 4.8 4.8  CL 98  --  98 98  CO2 24  --  22 22  GLUCOSE 87  --  84 146*  BUN 40*  --  46* 54*  CREATININE 5.01*  --  5.85* 6.57*  CALCIUM 9.4  --  8.9  8.7*  MG  --   --  1.4*  --   PHOS  --   --  5.2* 6.5*   GFR: Estimated Creatinine Clearance: 11 mL/min (A) (by C-G formula based on SCr of 6.57 mg/dL (H)). Liver Function Tests: Recent Labs  Lab 07/29/22 1438 07/30/22 0814 07/31/22 0925  AST 13* 10*  --   ALT 7 7  --   ALKPHOS 116 99  --   BILITOT 0.8 0.5  --   PROT 6.9 6.2*  --   ALBUMIN 2.9* 2.6* 2.5*   Recent Labs  Lab 07/29/22 1438  LIPASE 26   Recent Labs  Lab 07/29/22 1820  AMMONIA 20   Coagulation Profile: No results for input(s): "INR", "PROTIME" in the last 168 hours. Cardiac Enzymes: No results for input(s): "CKTOTAL", "CKMB", "CKMBINDEX", "TROPONINI" in the last 168 hours. BNP (last 3  results) No results for input(s): "PROBNP" in the last 8760 hours. HbA1C: No results for input(s): "HGBA1C" in the last 72 hours. CBG: Recent Labs  Lab 07/30/22 2336 07/31/22 0207 07/31/22 0329 07/31/22 0619 07/31/22 0806  GLUCAP 59* 129* 91 59* 128*   Lipid Profile: No results for input(s): "CHOL", "HDL", "LDLCALC", "TRIG", "CHOLHDL", "LDLDIRECT" in the last 72 hours. Thyroid Function Tests: Recent Labs    07/30/22 1610  TSH 6.847*   Anemia Panel: Recent Labs    07/30/22 0130 07/30/22 0351  VITAMINB12 724  --   FOLATE  --  9.4   Sepsis Labs: Recent Labs  Lab 07/30/22 0200  LATICACIDVEN 0.6    Recent Results (from the past 240 hour(s))  SARS Coronavirus 2 by RT PCR (hospital order, performed in [facility] hospital lab) *cepheid single result test* Anterior Nasal Swab     Status: None   Collection Time: 07/30/22  1:30 AM   Specimen: Anterior Nasal Swab  Result Value Ref Range Status   SARS Coronavirus 2 by RT PCR NEGATIVE NEGATIVE Final    Comment: Performed at [facility], [address] [place], [place] [address]  MRSA Next Gen by PCR, Nasal     Status: None   Collection Time: 07/30/22  8:35 AM   Specimen: Nasal Mucosa; Nasal Swab  Result Value Ref Range Status   MRSA by PCR Next Gen NOT DETECTED NOT DETECTED Final    Comment: (NOTE) The GeneXpert MRSA Assay (FDA approved for NASAL specimens only), is one component of a comprehensive MRSA colonization surveillance program. It is not intended to diagnose MRSA infection nor to guide or monitor treatment for MRSA infections. Test performance is not FDA approved in patients less than 68 years old. Performed at [facility], [address] [place], [place] [address]   Culture, blood (Routine X 2) w Reflex to ID Panel     Status: None (Preliminary result)   Collection Time: 07/30/22  4:02 PM   Specimen: BLOOD  Result Value Ref Range Status   Specimen Description BLOOD SITE NOT SPECIFIED  Final    Special Requests   Final    BOTTLES DRAWN AEROBIC AND ANAEROBIC Blood Culture adequate volume   Culture   Final    NO GROWTH < 24 HOURS Performed at [facility] Lab, [address] [place], [place] [address]    Report Status PENDING  Incomplete  Culture, blood (Routine X 2) w Reflex to ID Panel     Status: None (Preliminary result)   Collection Time: 07/30/22  4:02 PM   Specimen: BLOOD  Result Value Ref Range Status   Specimen Description BLOOD SITE NOT SPECIFIED  Final   Special Requests   Final    BOTTLES DRAWN AEROBIC AND ANAEROBIC Blood Culture adequate volume   Culture   Final    NO GROWTH < 24 HOURS Performed at [facility], [address], [place], [place] [address]    Report Status PENDING  Incomplete         Radiology Studies: DG Hand 2 View Right  Result Date: 07/31/2022 CLINICAL DATA:  Right hand pain. EXAM: RIGHT HAND - 2 VIEW COMPARISON:  Right hand radiographs 07/15/2022 FINDINGS: Interval amputation of the fourth finger to the distal shaft of the proximal phalanx. Interval amputation of the index finger to the proximal shaft of the middle phalanx. The resection margins are within normal limits given the recent surgery since 07/15/2022. Note is made that the soft tissues of the fourth digit were atrophic distally. High-grade extensive vascular calcifications are again seen. 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 IMPRESSION: 1. Interval amputation of the fourth finger to the distal shaft of the proximal phalanx. 2. Interval amputation of the index finger to the proximal shaft of the middle phalanx. 3. High-grade extensive vascular calcifications. Electronically Signed   By: Neita Garnet M.D.   On: 07/31/2022 10:07   Overnight EEG  with video  Result Date: 07/31/2022 Charlsie Quest, MD     07/31/2022  8:37 AM Patient Name: Chase [name] MRN: [id number] Epilepsy Attending: Charlsie Quest Referring Physician/Provider: [name], MD  Duration: 07/30/2022 1337 to 07/31/2022 0830 Patient history:  72 y.o. male with history of traumatic head injury presented to the ED from dialysis complaining of abdominal pain for 1 day 7/23.  On admission patient was alert and oriented to self only not at baseline per wife.  7/24 patient had a witnessed seizure described as right-sided weakness, right facial droop. EEG to evaluate for seizure Level of alertness:  lethargic AEDs during EEG study: LEV Technical aspects: This EEG study was done with scalp electrodes positioned according to the 10-20 International system of electrode placement. Electrical activity was reviewed with band pass filter of 1-70Hz , sensitivity of 7 uV/mm, display speed of 57mm/sec with a 60Hz  notched filter applied as appropriate. EEG data were recorded continuously and digitally stored.  Video monitoring was available and reviewed as appropriate. Description: EEG showed continuous generalized polymorphic sharply contoured 3 to 6 Hz theta-delta slowing, at times with triphasic morphology. Hyperventilation and photic stimulation were not performed.  EEG was disconnected between 07/30/2022 at 1637 to 1844  due to technical reasons. ABNORMALITY - Continuous slow, generalized IMPRESSION: This study is suggestive of moderate to severe diffuse encephalopathy, nonspecific etiology but likely related to toxic-metabolic causes. No seizures or definite epileptiform discharges were seen throughout the recording. Priyanka Annabelle Harman   CT HEAD WO CONTRAST ( )  Result Date: 07/30/2022 CLINICAL DATA:  Neuro deficit, acute, stroke suspected. " Right-sided weakness, not localizing to that side but is to the left side." EXAM: CT HEAD WITHOUT CONTRAST TECHNIQUE: Contiguous axial images were  obtained from the base of the skull through the vertex without intravenous contrast. RADIATION DOSE REDUCTION: This exam was performed according to the departmental dose-optimization program which includes automated exposure control, adjustment of the mA and/or kV according to patient size and/or  use of iterative reconstruction technique. COMPARISON:  07/29/2022 FINDINGS: Brain: Advanced brain atrophy. No focal abnormality seen affecting the brainstem. No focal cerebellar finding. Cerebral hemispheres show chronic small-vessel ischemic changes of the white matter. No sign of acute infarction. No mass, hemorrhage, hydrocephalus or extra-axial collection. Vascular: There is atherosclerotic calcification of the major vessels at the base of the brain. Skull: Negative Sinuses/Orbits: Clear/normal Other: None IMPRESSION: No acute CT finding. Advanced brain atrophy. Chronic small-vessel ischemic changes of the cerebral hemispheric white matter. The exam suffers from some motion degradation and streak artifact from what are probably EEG monitoring devices. Electronically Signed   By: Paulina Fusi M.D.   On: 07/30/2022 17:28   DG CHEST PORT 1 VIEW  Result Date: 07/30/2022 CLINICAL DATA:  [id number] Acute encephalopathy [id number] EXAM: PORTABLE CHEST 1 VIEW COMPARISON:  07/15/2022 FINDINGS: Cardiomegaly, vascular congestion. Continued small left pleural effusion with left lower lobe atelectasis or infiltrate, similar to prior study. Scarring in the right upper lobe. No acute bony abnormality. Spinal stimulator wires remain in stable position in the midthoracic spine. IMPRESSION: Small left pleural effusion with left lower lobe atelectasis or infiltrate, stable. Cardiomegaly, vascular congestion. Electronically Signed   By: Charlett Nose M.D.   On: 07/30/2022 02:04   US Abdomen Limited RUQ (LIVER/GB)  Result Date: 07/29/2022 CLINICAL DATA:  Right upper quadrant abdominal pain. EXAM: ULTRASOUND ABDOMEN LIMITED RIGHT UPPER  QUADRANT COMPARISON:  CT abdomen pelvis dated 07/26/2022. FINDINGS: Evaluation is limited due to overlying bowel gas. Gallbladder: Cholecystectomy. Common bile duct: Diameter: 14 mm. The common bile duct is dilated, likely post cholecystectomy. Liver: The liver is grossly unremarkable. Portal vein is patent on color Doppler imaging with normal direction of blood flow towards the liver. Other: None. IMPRESSION: Cholecystectomy, otherwise unremarkable right upper quadrant ultrasound. Electronically Signed   By: Elgie Collard M.D.   On: 07/29/2022 23:31   CT ABDOMEN PELVIS W CONTRAST  Result Date: 07/29/2022 CLINICAL DATA:  Acute abdominal pain EXAM: CT ABDOMEN AND PELVIS WITH CONTRAST TECHNIQUE: Multidetector CT imaging of the abdomen and pelvis was performed using the standard protocol following bolus administration of intravenous contrast. RADIATION DOSE REDUCTION: This exam was performed according to the departmental dose-optimization program which includes automated exposure control, adjustment of the mA and/or kV according to patient size and/or use of iterative reconstruction technique. CONTRAST:  75mL OMNIPAQUE IOHEXOL 350 MG/ML SOLN COMPARISON:  CT chest abdomen and pelvis 07/15/2022 FINDINGS: Lower chest: There is a chronic appearing small left pleural effusion with pleural thickening. Stable peripheral rounded parenchymal opacities are present favored as rounded atelectasis. The heart is enlarged. Hepatobiliary: Patient is status post cholecystectomy. There is intra and extrahepatic biliary ductal dilatation. The common bile duct measures 2.4 cm and contains hyperdense material similar to the prior study. No focal liver lesions are seen. Pancreas: Unremarkable. No pancreatic ductal dilatation or surrounding inflammatory changes. Spleen: Normal in size without focal abnormality. Adrenals/Urinary Tract: Left kidney is absent. Bilateral adrenal glands are within normal limits. Mildly hyperdense lesion in  the superior pole the right kidney measures 11 mm and appears unchanged. There subcentimeter cortical cysts similar to the prior study. There is no hydronephrosis or perinephric fat stranding. There is a calculus in the renal pelvis measuring 12 mm which appears unchanged. Right ureter and bladder are within normal limits. Stomach/Bowel: Stomach is within normal limits. Appendix appears normal. No evidence of bowel wall thickening, distention, or inflammatory changes. There is a large amount of stool throughout the colon. There is sigmoid  colon diverticulosis. Vascular/Lymphatic: Extensive vascular calcifications are seen throughout the abdomen and pelvis as well as aorta. Aorta and IVC are normal in size. No enlarged lymph nodes are identified. Reproductive: Prostate is unremarkable. Other: No abdominal wall hernia or abnormality. No abdominopelvic ascites. Musculoskeletal: Previously identified intramuscular hematoma in the anterior left lower abdominal wall has decreased in size and now has the appearance of enhancing low-attenuation collections likely related to resolving hematomas. This area measures 7.6 by 2.5 by 2.0 cm. No acute hematomas are identified. Thoracic spinal cord stimulator device is present. Multilevel degenerative changes affect the spine. Chronic compression deformity of L2 is unchanged. IMPRESSION: 1. No acute localizing process in the abdomen or pelvis. 2. Stable intra and extrahepatic biliary ductal dilatation with hyperdense material in the common bile duct compatible with choledocholithiasis. 3. Stable nonobstructing right renal calculus. 4. Stable hyperdense lesion in the superior pole the right kidney, indeterminate. Recommend further evaluation with MRI or ultrasound. 5. Stable chronic small left pleural effusion with pleural thickening and rounded atelectasis. 6. Left lower abdominal wall intramuscular hematoma has decreased in size and now has the appearance of enhancing  low-attenuation collections likely related to resolving hematomas. Aortic Atherosclerosis (ICD10-I70.0). Electronically Signed   By: Darliss Cheney M.D.   On: 07/29/2022 18:26   CT Head Wo Contrast  Result Date: 07/29/2022 CLINICAL DATA:  Mental status change, unknown cause. EXAM: CT HEAD WITHOUT CONTRAST TECHNIQUE: Contiguous axial images were obtained from the base of the skull through the vertex without intravenous contrast. RADIATION DOSE REDUCTION: This exam was performed according to the departmental dose-optimization program which includes automated exposure control, adjustment of the mA and/or kV according to patient size and/or use of iterative reconstruction technique. COMPARISON:  CT head dated July 15, 2022 FINDINGS: Brain: No evidence of acute infarction, hemorrhage, hydrocephalus, extra-axial collection or mass lesion/mass effect. Prominence of the ventricles and sulci secondary to moderate cerebral atrophy. Diffuse low-attenuation of the periventricular white matter presumed advanced chronic microvascular ischemic changes. Vascular: No hyperdense vessel or unexpected calcification. Skull: Normal. Negative for fracture or focal lesion. Sinuses/Orbits: No acute finding. Other: None. IMPRESSION: 1. No acute intracranial abnormality. 2. Moderate cerebral atrophy and advanced chronic microvascular ischemic changes of the periventricular white matter. Electronically Signed   By: Larose Hires D.O.   On: 07/29/2022 18:22        Scheduled Meds:  aspirin EC  81 mg Oral Daily   calcitRIOL  0.25 mcg Oral Daily   calcium acetate  667 mg Oral TID WC   Chlorhexidine Gluconate Cloth  6 each Topical Q0600   clopidogrel  75 mg Oral Daily   dextrose  25 g Intravenous STAT   [START ON 08/01/2022] heparin  3,700 Units Dialysis Once in dialysis   levothyroxine  250 mcg Oral Q0600   midodrine  10 mg Oral Q T,Th,Sa-HD   pantoprazole  40 mg Oral BID   rosuvastatin  10 mg Oral QHS   sodium chloride flush  3  mL Intravenous Q12H   Continuous Infusions:  sodium chloride     dextrose 50 mL/hr at 07/31/22 0748   valproate sodium     valproate sodium       LOS: 1 day     Huey Bienenstock, MD [facility] Available via Epic secure chat 7am-7pm After these hours, please refer to coverage provider listed on [email or web address] 07/31/2022, 10:18 AM

## 2022-07-31 NOTE — Procedures (Addendum)
Patient Name: Chase [name]  MRN: [id number]  Epilepsy Attending: Charlsie Quest  Referring Physician/Provider: [name], MD   Duration: 07/30/2022 1337 to 07/31/2022 2100  Patient history:  72 y.o. male with history of traumatic head injury presented to the ED from dialysis complaining of abdominal pain for 1 day 7/23.  On admission patient was alert and oriented to self only not at baseline per wife.  7/24 patient had a witnessed seizure described as right-sided weakness, right facial droop. EEG to evaluate for seizure  Level of alertness:  lethargic   AEDs during EEG study: LEV  Technical aspects: This EEG study was done with scalp electrodes positioned according to the 10-20 International system of electrode placement. Electrical activity was reviewed with band pass filter of 1-70Hz , sensitivity of 7 uV/mm, display speed of 39mm/sec with a 60Hz  notched filter applied as appropriate. EEG data were recorded continuously and digitally stored.  Video monitoring was available and reviewed as appropriate.  Description: EEG showed continuous generalized polymorphic sharply contoured 3 to 6 Hz theta-delta slowing, at times with triphasic morphology. Hyperventilation and photic stimulation were not performed.    EEG was disconnected between 07/30/2022 at 1637 to 1844  and 07/31/2022 1433 to 1928 due to technical reasons.  ABNORMALITY - Continuous slow, generalized  IMPRESSION: This study is suggestive of moderate to severe diffuse encephalopathy, nonspecific etiology but likely related to toxic-metabolic causes. No seizures or definite epileptiform discharges were seen throughout the recording.  Kalise Fickett Annabelle Harman

## 2022-07-31 NOTE — Progress Notes (Addendum)
PT Cancellation Note  Patient Details Name: Chase [name] MRN: [id number] DOB: 08/12/1950   Cancelled Treatment:    Reason Eval/Treat Not Completed: (P) Other (comment) (pt having EEG wires removed and per RN more lethargic today, will reattempt later in day if time if pt more alert.) Will continue to follow per PT plan of care.  Dorathy Kinsman Suttyn Cryder 07/31/2022, 2:41 PM

## 2022-07-31 NOTE — Progress Notes (Signed)
Subjective: Chase [name] is a 72 y.o. male PMH of amputation of right ring finger crush amputation of right index finger due to gangrene 07/17/2022, aspirin Plavix and statin, CAD, depression, DVT on warfarin, CKD with HD, history of traumatic head injury presented to the ED from dialysis complaining of abdominal pain for 1 day 7/23.  On admission patient was alert and oriented to self only not at baseline per wife.  7/24 patient had a seizure, given 1 g Keppra load and started on 500 mg twice daily maintenance dosage.  Neurology consulted Per chart review wife states that he has had increased confusion and decreased memory for about 2 to 3 months and that he often becomes increasingly altered risk for developing infections. Ortho evaluated right hand d/t redness and edema from recent amputation surgery, no intervention needed. Obtunded on exam 7/24. CTH Neg, EEG Neg.  On exam, he is restless and disoriented, follows commands, moving all extremities spontaneously. Spoke with bedside RN, reported the restlessness had been all night. Patient continues to c/o abdominal pain, hx of back pain with oxy as a home medicine. Has had up and down blood sugars all night, currently NPO.   Exam: Vitals:   07/31/22 0332 07/31/22 0808  BP: 125/60 (!) 166/76  Pulse: 63 79  Resp:  20  Temp:  97.8 F (36.6 C)  SpO2: 98% (!) 88%   Gen: In bed, NAD Resp: non-labored breathing, no acute distress Abd: soft, nt Ext: right fingers are reddened and swollen  Neuro: MS: awake, oriented to self only. Does not answer other orientations questions. Follows simple commands. Restless, but redirectable. C/o Pain.  Moves all extremities spontaneously.   Pertinent Labs: CBG 61 -> 59-> 129 -> 91 -> 59 -> 128 Sed Rate: 34 TSH: 6.847 RPR: Neg Blood Cultures: prelim negative UA: pending collection  Impression: Chase [name] is a 72 y.o. male PMH significant for amputation of right ring finger crush  amputation of right index finger 07/17/2022 , on aspirin Plavix and statin, CAD, depression, DVT on warfarin, CKD with MWF dialysis, PD catheter removed in July, history of traumatic head injury presented to the ED from dialysis complaining of abdominal pain for 1 day 7/23.  On admission patient was alert and oriented to self only not at baseline per wife.  7/24 patient had a witnessed seizure. CTH and EEG Neg. On exam today, patient is restless and disoriented, but redirectable.    -Toxic metabolic encephalopathy  - ?acute on chronic worsening of renal function, ?d/t infectious process -Seizure -?DDS   Recommendations: Continue LTM EEG one more day Discontinue Keppra d/t restless behavior Change to Depakote -load now and continue 500mg  BID Level tomorrow AM Repeat head CT this afternoon (unable to do MRI d/t spinal stimulator) IVF while NPO to avoid dehydration which can trigger seizures/increased toxic-metabolic encephalopathy effects Infection mgmt per primary  Pt seen by Neuro NP/APP and later by MD. Note/plan to be edited by MD as needed.    Lynnae January, DNP, AGACNP-BC [facility] Please use AMION for contact information & EPIC for messaging.   Attending Neurohospitalist Addendum Patient seen and examined with APP/Resident. Agree with the history and physical as documented above. Agree with the plan as documented, which I helped formulate. I have independently reviewed the chart, obtained history, review of systems and examined the patient.I have personally reviewed pertinent head/neck/spine imaging (CT/MRI). D/W Dr Randol Kern Please feel free to call with any questions.  -- [name], MD Neurologist [facility] Pager: [phone]

## 2022-07-31 NOTE — Progress Notes (Signed)
OT Cancellation Note  Patient Details Name: Chase [name] MRN: [id number] DOB: 1950-05-31   Cancelled Treatment:    Reason Eval/Treat Not Completed: Patient at procedure or test/ unavailable (Pt currently off unit for HD. OT to re-attempt skilled OT eval as available/appropriate.)  Rosanne Sack "Orson Eva., OTR/L, MA Acute Rehab [phone]   Lendon Colonel 07/31/2022, 3:20 PM

## 2022-07-31 NOTE — TOC Initial Note (Signed)
Transition of Care [facility]) - Initial/Assessment Note    Patient Details  Name: Chase [name] MRN: [id number] Date of Birth: 1950/07/11  Transition of Care [facility]) CM/SW Contact:    Mearl Latin, LCSW Phone Number: 07/31/2022, 3:32 PM  Clinical Narrative:                 Patient resides at [facility] under long term care. CSW will continue to follow.   Expected Discharge Plan: Skilled Nursing Facility Barriers to Discharge: Continued Medical Work up   Patient Goals and CMS Choice            Expected Discharge Plan and Services In-house Referral: Clinical Social Work   Post Acute Care Choice: Skilled Nursing Facility, Dialysis Living arrangements for the past 2 months: Skilled Nursing Facility                                      Prior Living Arrangements/Services Living arrangements for the past 2 months: Skilled Nursing Facility Lives with:: Facility Resident Patient language and need for interpreter reviewed:: Yes Do you feel safe going back to the place where you live?: Yes      Need for Family Participation in Patient Care: Yes (Comment) Care giver support system in place?: Yes (comment)   Criminal Activity/Legal Involvement Pertinent to Current Situation/Hospitalization: No - Comment as needed  Activities of Daily Living      Permission Sought/Granted Permission sought to share information with : Facility Medical sales representative, Family Supports Permission granted to share information with : No  Share Information with NAME: Aurther Loft  Permission granted to share info w AGENCY: [place]  Permission granted to share info w Relationship: Spouse  Permission granted to share info w Contact Information: [phone]  Emotional Assessment Appearance:: Appears stated age Attitude/Demeanor/Rapport: Unable to Assess Affect (typically observed): Unable to Assess Orientation: :  (Disoriented x4) Alcohol / Substance Use: Not Applicable Psych Involvement: No  (comment)  Admission diagnosis:  Acute encephalopathy [G93.40] Seizures (HCC) [R56.9] Patient Active Problem List   Diagnosis Date Noted   Acute encephalopathy 07/30/2022   Seizures (HCC) 07/30/2022   Gangrene of finger (HCC) 07/16/2022   Acute metabolic encephalopathy 07/16/2022   Hemoperitoneum 07/16/2022   Cystitis 07/16/2022   Elevated troponin 07/16/2022   Pulmonary nodule 07/16/2022   Intraoperative ischemia of hand 07/01/2022   Right hand pain 07/01/2022   Hypothyroidism 06/25/2022   Hypertension 06/25/2022   Anemia of chronic disease 06/25/2022   Depression 06/25/2022   Gastroesophageal reflux disease 06/25/2022   Hypomagnesemia 06/25/2022   Hyponatremia 06/25/2022   Obesity (BMI 30-39.9) 06/25/2022   ESRD on dialysis (HCC) 06/25/2022   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 [place] [address] Phone: [phone] Fax: [phone]     Social Determinants of Health (SDOH) Social History: SDOH Screenings   Food Insecurity: No Food Insecurity (07/16/2022)  Housing: Low Risk  (07/16/2022)  Transportation Needs: No Transportation Needs (07/16/2022)  Utilities: Not At Risk (07/16/2022)  Financial Resource Strain: Low Risk  (09/16/2021)   Received from [facility], [facility]  Physical Activity: Inactive (09/16/2021)   Received from [facility], [facility]  Social Connections: Socially Integrated (09/16/2021)   [facility], [facility]  Stress: Patient  Declined (09/16/2021)   Received from [facility], [facility]  Tobacco Use: Low Risk  (07/29/2022)   SDOH Interventions:     Readmission Risk Interventions    07/31/2022    3:30 PM 07/16/2022    3:22 PM  Readmission Risk Prevention Plan  Transportation Screening Complete Complete  PCP or Specialist Appt within 5-7 Days  Complete  Home Care Screening  Complete  Medication Review (RN CM)  Complete  Medication Review (RN Care Manager) Complete   PCP or Specialist appointment within 3-5 days of discharge Complete   HRI or Home Care Consult Complete   SW Recovery Care/Counseling Consult Complete   Palliative Care Screening Not Applicable   Skilled Nursing Facility Complete

## 2022-07-31 NOTE — [place] FL2 (Signed)
[place] MEDICAID FL2 LEVEL OF CARE FORM     IDENTIFICATION  Patient Name: Chase [name] Birthdate: [birth year] Sex: male Admission Date (Current Location): 07/29/2022  [facility] and [place] Number:  Producer, television/film/video and Address:  [facility], [address] [place], [place] [address]      Provider Number: [id number]  Attending Physician Name and Address:  Elgergawy, Leana Roe, MD  Relative Name and Phone Number:       Current Level of Care: Hospital Recommended Level of Care: Skilled Nursing Facility Prior Approval Number:    Date Approved/Denied:   PASRR Number: [id number]  Discharge Plan: SNF    Current Diagnoses: Patient Active Problem List   Diagnosis Date Noted   Acute encephalopathy 07/30/2022   Seizures (HCC) 07/30/2022   Gangrene of finger (HCC) 07/16/2022   Acute metabolic encephalopathy 07/16/2022   Hemoperitoneum 07/16/2022   Cystitis 07/16/2022   Elevated troponin 07/16/2022   Pulmonary nodule 07/16/2022   Intraoperative ischemia of hand 07/01/2022   Right hand pain 07/01/2022   Hypothyroidism 06/25/2022   Hypertension 06/25/2022   Anemia of chronic disease 06/25/2022   Depression 06/25/2022   Gastroesophageal reflux disease 06/25/2022   Hypomagnesemia 06/25/2022   Hyponatremia 06/25/2022   Obesity (BMI 30-39.9) 06/25/2022   ESRD on dialysis (HCC) 06/25/2022   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) 06/23/2022   History of traumatic head injury 02/17/2022   Acute subdural hematoma (HCC) 10/25/2020   ESRD (end stage renal disease) (HCC) 10/25/2020   Leukocytosis 10/25/2020   Fall at home, initial encounter 10/25/2020   Abnormal urinalysis 10/25/2020   Chronic pain 10/25/2020   Diabetes mellitus (HCC) 10/25/2020   Dialysis patient (HCC) 07/09/2020   Back pain 07/09/2020    Orientation RESPIRATION BLADDER Height & Weight      (Disoriented x4)  O2 (2L nasal cannula) Incontinent Weight: 190 lb 0.6 oz (86.2  kg) Height:  5\' 6"  (167.6 cm)  BEHAVIORAL SYMPTOMS/MOOD NEUROLOGICAL BOWEL NUTRITION STATUS      Incontinent Diet (See dc summary)  AMBULATORY STATUS COMMUNICATION OF NEEDS Skin   Extensive Assist Verbally Other (Comment) (abrasion on thigh)                       Personal Care Assistance Level of Assistance  Bathing, Feeding, Dressing Bathing Assistance: Maximum assistance Feeding assistance: Maximum assistance Dressing Assistance: Maximum assistance     Functional Limitations Info  Sight, Hearing Sight Info: Impaired Hearing Info: Impaired      SPECIAL CARE FACTORS FREQUENCY                       Contractures Contractures Info: Not present    Additional Factors Info  Code Status, Allergies Code Status Info: Full Allergies Info: Gabapentin, Lipitor (Atorvastatin), Nsaids, Pregabalin           Current Medications (07/31/2022):  This is the current hospital active medication list Current Facility-Administered Medications  Medication Dose Route Frequency Provider Last Rate Last Admin   0.9 %  sodium chloride infusion  250 mL Intravenous PRN Doutova, Anastassia, MD       acetaminophen (TYLENOL) tablet 650 mg  650 mg Oral Q6H PRN Doutova, Anastassia, MD       Or   acetaminophen (TYLENOL) suppository 650 mg  650 mg Rectal Q6H PRN Doutova, Anastassia, MD       albuterol (PROVENTIL) (2.5 MG/3ML) 0.083% nebulizer solution 3 mL  3 mL  Inhalation Q4H PRN Therisa Doyne, MD       aspirin EC tablet 81 mg  81 mg Oral Daily Doutova, Anastassia, MD   81 mg at 07/31/22 1026   calcitRIOL (ROCALTROL) capsule 0.25 mcg  0.25 mcg Oral Daily Doutova, Anastassia, MD   0.25 mcg at 07/31/22 1026   calcium acetate (PHOSLO) capsule 667 mg  667 mg Oral TID WC Therisa Doyne, MD   667 mg at 07/30/22 1105   Chlorhexidine Gluconate Cloth 2 % PADS 6 each  6 each Topical Q0600 Delano Metz, MD   6 each at 07/31/22 0557   clopidogrel (PLAVIX) tablet 75 mg  75 mg Oral Daily  Therisa Doyne, MD   75 mg at 07/31/22 1026   Darbepoetin Alfa (ARANESP) injection 60 mcg  60 mcg Subcutaneous Q Thu-1800 Zeyfang, David, PA-C       dextrose 5 % solution   Intravenous Continuous Marlin Canary U, DO 50 mL/hr at 07/31/22 0748 New Bag at 07/31/22 0748   dextrose 50 % solution 25 g  25 g Intravenous STAT Elgergawy, Leana Roe, MD       [START ON 08/01/2022] heparin injection 2,000 Units  2,000 Units Dialysis PRN Delano Metz, MD       Melene Muller ON 08/01/2022] heparin injection 3,700 Units  3,700 Units Dialysis Once in dialysis Delano Metz, MD       heparin injection 5,000 Units  5,000 Units Subcutaneous Q8H Elgergawy, Leana Roe, MD   5,000 Units at 07/31/22 1320   levothyroxine (SYNTHROID) tablet 250 mcg  250 mcg Oral Q0600 Therisa Doyne, MD   250 mcg at 07/30/22 0802   midodrine (PROAMATINE) tablet 10 mg  10 mg Oral Q T,Th,Sa-HD Therisa Doyne, MD   10 mg at 07/31/22 1319   ondansetron (ZOFRAN) tablet 4 mg  4 mg Oral Q6H PRN Therisa Doyne, MD       Or   ondansetron (ZOFRAN) injection 4 mg  4 mg Intravenous Q6H PRN Doutova, Anastassia, MD       pantoprazole (PROTONIX) EC tablet 40 mg  40 mg Oral BID Therisa Doyne, MD   40 mg at 07/31/22 1026   rosuvastatin (CRESTOR) tablet 10 mg  10 mg Oral QHS Doutova, Anastassia, MD       sodium chloride flush (NS) 0.9 % injection 3 mL  3 mL Intravenous Q12H Doutova, Anastassia, MD   3 mL at 07/31/22 1027   sodium chloride flush (NS) 0.9 % injection 3 mL  3 mL Intravenous PRN Doutova, Anastassia, MD       valproate (DEPACON) 500 mg in dextrose 5 % 50 mL IVPB  500 mg Intravenous Q12H Lynnae January, NP         Discharge Medications: Please see discharge summary for a list of discharge medications.  Relevant Imaging Results:  Relevant Lab Results:   Additional Information [id number];  [facility] on TTS 11:30 am  Mearl Latin, LCSW

## 2022-07-31 NOTE — Progress Notes (Signed)
[facility] and Patient's Nurse called EEG at approximately 2:50PM to inform and to receive instructions on temporary disconnection of head box cable only so that patient could go to Dialysis. Nurse or [facility] monitoring should call back the EEG department when the Patient returns back from Dialysis. EEG third shift will be informed and given head up for re-connection to continue LTM EEG monitoring. Pt left for

## 2022-07-31 NOTE — Progress Notes (Signed)
Subjective: Seen and examined in room, currently lethargic awakens to voice, physical contact  then back to sleep.  Currently not in distress.  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 Dialysis Access: L LA avf + bruit  Home meds include - tylenol, albuterol, aspirin, buprenorphine, buspirone, rocaltrol 0.25 mcg every day, phoslo 1 ac, plavix, escitalopram, levothyroxine, emla cream, melatonin, midodrine 10mg  pre hd tts, oxyIR, pantoprazole, rosuvastatin     OP HD: [facility] Dr TTS  3.5h   99kg   350/700   AVF  Heparin 3700+ 500/hr - had 38 min of HD yest 7/23, stopped due to agitation - EPO 7600 q HD       Problem/Plan: AMS -neurology consulted workup = moderate to severe diffuse encephalopathy nonspecific etiology likely toxic metabolic / No sz/   " some memory loss "per the wife over the last 3 months. oxy IR on hold. He is not uremic and doesn't miss HD sessions.  May have to use mild sedation to HD ESRD - on HD TTS. Has minimal HD 07/29/22 , but pt agitated and labs/ volume look good.  HD for today HTN/ volume - BP's a bit high, improving some from admission/ no vol excess on exam. Wt's are well under dry wt. Get standing wt, no UF next HD.  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 Follow.  Use Aranesp 60 Thursday HD start today MBD ckd - CCa ~10, phos  6.5. Continue po vdra and phoslo as binder. Gangrene of right hand fingers= attending managing /noted discussion with Ortho no intervention currently     Lenny Pastel, PA-C [facility] [phone] 07/31/2022,11:08 AM  LOS: 1 day   Labs: Basic Metabolic Panel: Recent Labs  Lab 07/29/22 1438 07/30/22 0244 07/30/22 0814 07/31/22 0925  NA 136 136 134* 133*  K 4.6 4.7 4.8 4.8  CL 98  --  98 98  CO2 24  --  22 22  GLUCOSE 87  --  84 146*  BUN 40*  --  46* 54*  CREATININE 5.01*  --  5.85* 6.57*  CALCIUM 9.4  --  8.9 8.7*  PHOS  --   --  5.2* 6.5*   Liver Function Tests: Recent Labs  Lab 07/29/22 1438 07/30/22 0814 07/31/22 0925  AST 13* 10*  --   ALT 7 7  --   ALKPHOS 116 99  --   BILITOT 0.8 0.5  --   PROT 6.9 6.2*  --   ALBUMIN 2.9* 2.6* 2.5*   Recent Labs  Lab 07/29/22 1438  LIPASE 26   Recent Labs  Lab 07/29/22 1820  AMMONIA 20   CBC: Recent Labs  Lab 07/29/22 1438 07/30/22  0244 07/30/22 0814 07/31/22 0925  WBC 7.4  --  6.3 13.3*  NEUTROABS 5.6  --   --   --   HGB 10.7* 10.9* 9.7* 9.4*  HCT 35.2* 32.0* 32.1* 30.6*  MCV 98.6  --  95.3 97.1  PLT 183  --  154 148*   Cardiac Enzymes: No results for input(s): "CKTOTAL", "CKMB", "CKMBINDEX", "TROPONINI" in the last 168 hours. CBG: Recent Labs  Lab 07/31/22 0207 07/31/22 0329 07/31/22 0619 07/31/22 0806 07/31/22 1034  GLUCAP 129* 91 59* 128* 112*    Studies/Results: DG Hand 2 View Right  Result Date: 07/31/2022 CLINICAL DATA:  Right hand pain. EXAM: RIGHT HAND - 2 VIEW COMPARISON:  Right hand radiographs 07/15/2022 FINDINGS: Interval amputation of the fourth finger to the distal shaft of the proximal phalanx. Interval amputation of the index finger to the proximal shaft of the middle phalanx. The resection margins are within normal limits given the recent surgery since 07/15/2022. Note is made that the soft tissues of the fourth digit were atrophic distally. High-grade  extensive vascular calcifications are again seen. 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 IMPRESSION: 1. Interval amputation of the fourth finger to the distal shaft of the proximal phalanx. 2. Interval amputation of the index finger to the proximal shaft of the middle phalanx. 3. High-grade extensive vascular calcifications. Electronically Signed   By: Neita Garnet M.D.   On: 07/31/2022 10:07   Overnight EEG with video  Result Date: 07/31/2022 Charlsie Quest, MD     07/31/2022  8:37 AM Patient Name: Chase [name] MRN: [id number] Epilepsy Attending: Charlsie Quest Referring Physician/Provider: [name], MD  Duration: 07/30/2022 1337 to 07/31/2022 0830 Patient history:  72 y.o. male with history of traumatic head injury presented to the ED from dialysis complaining of abdominal pain for 1 day 7/23.  On admission patient was alert and oriented to self only not at baseline per wife.  7/24 patient had a witnessed seizure described as right-sided weakness, right facial droop. EEG to evaluate for seizure Level of alertness:  lethargic AEDs during EEG study: LEV Technical aspects: This EEG study was done with scalp electrodes positioned according to the 10-20 International system of electrode placement. Electrical activity was reviewed with band pass filter of 1-70Hz , sensitivity of 7 uV/mm, display speed of 36mm/sec with a 60Hz  notched filter applied as appropriate. EEG data were recorded continuously and digitally stored.  Video monitoring was available and reviewed as appropriate. Description: EEG showed continuous generalized polymorphic sharply contoured 3 to 6 Hz theta-delta slowing, at times with triphasic morphology. Hyperventilation and photic stimulation were not  performed.  EEG was disconnected between 07/30/2022 at 1637 to 1844  due to technical reasons. ABNORMALITY - Continuous slow, generalized IMPRESSION: This study is suggestive of moderate to severe diffuse encephalopathy, nonspecific etiology but likely related to toxic-metabolic causes. No seizures or definite epileptiform discharges were seen throughout the recording. Priyanka Annabelle Harman   CT HEAD WO CONTRAST ( )  Result Date: 07/30/2022 CLINICAL DATA:  Neuro deficit, acute, stroke suspected. " Right-sided weakness, not localizing to that side but is to the left side." EXAM: CT HEAD WITHOUT CONTRAST TECHNIQUE: Contiguous axial images were obtained from the base of the skull through the vertex without intravenous contrast. RADIATION DOSE REDUCTION: This exam was performed according to the departmental dose-optimization  program which includes automated exposure control, adjustment of the mA and/or kV according to patient size and/or use of iterative reconstruction technique. COMPARISON:  07/29/2022 FINDINGS: Brain: Advanced brain atrophy. No focal abnormality seen affecting the brainstem. No focal cerebellar finding. Cerebral hemispheres show chronic small-vessel ischemic changes of the white matter. No sign of acute infarction. No mass, hemorrhage, hydrocephalus or extra-axial collection. Vascular: There is atherosclerotic calcification of the major vessels at the base of the brain. Skull: Negative Sinuses/Orbits: Clear/normal Other: None IMPRESSION: No acute CT finding. Advanced brain atrophy. Chronic small-vessel ischemic changes of the cerebral hemispheric white matter. The exam suffers from some motion degradation and streak artifact from what are probably EEG monitoring devices. Electronically Signed   By: Paulina Fusi M.D.   On: 07/30/2022 17:28   DG CHEST PORT 1 VIEW  Result Date: 07/30/2022 CLINICAL DATA:  [id number] Acute encephalopathy [id number] EXAM: PORTABLE CHEST 1 VIEW COMPARISON:  07/15/2022 FINDINGS:  Cardiomegaly, vascular congestion. Continued small left pleural effusion with left lower lobe atelectasis or infiltrate, similar to prior study. Scarring in the right upper lobe. No acute bony abnormality. Spinal stimulator wires remain in stable position in the midthoracic spine. IMPRESSION: Small left pleural effusion with left lower lobe atelectasis or infiltrate, stable. Cardiomegaly, vascular congestion. Electronically Signed   By: Charlett Nose M.D.   On: 07/30/2022 02:04   US Abdomen Limited RUQ (LIVER/GB)  Result Date: 07/29/2022 CLINICAL DATA:  Right upper quadrant abdominal pain. EXAM: ULTRASOUND ABDOMEN LIMITED RIGHT UPPER QUADRANT COMPARISON:  CT abdomen pelvis dated 07/26/2022. FINDINGS: Evaluation is limited due to overlying bowel gas. Gallbladder: Cholecystectomy. Common bile duct: Diameter: 14 mm. The common bile duct is dilated, likely post cholecystectomy. Liver: The liver is grossly unremarkable. Portal vein is patent on color Doppler imaging with normal direction of blood flow towards the liver. Other: None. IMPRESSION: Cholecystectomy, otherwise unremarkable right upper quadrant ultrasound. Electronically Signed   By: Elgie Collard M.D.   On: 07/29/2022 23:31   CT ABDOMEN PELVIS W CONTRAST  Result Date: 07/29/2022 CLINICAL DATA:  Acute abdominal pain EXAM: CT ABDOMEN AND PELVIS WITH CONTRAST TECHNIQUE: Multidetector CT imaging of the abdomen and pelvis was performed using the standard protocol following bolus administration of intravenous contrast. RADIATION DOSE REDUCTION: This exam was performed according to the departmental dose-optimization program which includes automated exposure control, adjustment of the mA and/or kV according to patient size and/or use of iterative reconstruction technique. CONTRAST:  75mL OMNIPAQUE IOHEXOL 350 MG/ML SOLN COMPARISON:  CT chest abdomen and pelvis 07/15/2022 FINDINGS: Lower chest: There is a chronic appearing small left pleural effusion with  pleural thickening. Stable peripheral rounded parenchymal opacities are present favored as rounded atelectasis. The heart is enlarged. Hepatobiliary: Patient is status post cholecystectomy. There is intra and extrahepatic biliary ductal dilatation. The common bile duct measures 2.4 cm and contains hyperdense material similar to the prior study. No focal liver lesions are seen. Pancreas: Unremarkable. No pancreatic ductal dilatation or surrounding inflammatory changes. Spleen: Normal in size without focal abnormality. Adrenals/Urinary Tract: Left kidney is absent. Bilateral adrenal glands are within normal limits. Mildly hyperdense lesion in the superior pole the right kidney measures 11 mm and appears unchanged. There subcentimeter cortical cysts similar to the prior study. There is no hydronephrosis or perinephric fat stranding. There is a calculus in the renal pelvis measuring 12 mm which appears unchanged. Right ureter and bladder are within normal limits. Stomach/Bowel: Stomach is within normal limits. Appendix appears normal. No evidence of bowel wall thickening,  distention, or inflammatory changes. There is a large amount of stool throughout the colon. There is sigmoid colon diverticulosis. Vascular/Lymphatic: Extensive vascular calcifications are seen throughout the abdomen and pelvis as well as aorta. Aorta and IVC are normal in size. No enlarged lymph nodes are identified. Reproductive: Prostate is unremarkable. Other: No abdominal wall hernia or abnormality. No abdominopelvic ascites. Musculoskeletal: Previously identified intramuscular hematoma in the anterior left lower abdominal wall has decreased in size and now has the appearance of enhancing low-attenuation collections likely related to resolving hematomas. This area measures 7.6 by 2.5 by 2.0 cm. No acute hematomas are identified. Thoracic spinal cord stimulator device is present. Multilevel degenerative changes affect the spine. Chronic compression  deformity of L2 is unchanged. IMPRESSION: 1. No acute localizing process in the abdomen or pelvis. 2. Stable intra and extrahepatic biliary ductal dilatation with hyperdense material in the common bile duct compatible with choledocholithiasis. 3. Stable nonobstructing right renal calculus. 4. Stable hyperdense lesion in the superior pole the right kidney, indeterminate. Recommend further evaluation with MRI or ultrasound. 5. Stable chronic small left pleural effusion with pleural thickening and rounded atelectasis. 6. Left lower abdominal wall intramuscular hematoma has decreased in size and now has the appearance of enhancing low-attenuation collections likely related to resolving hematomas. Aortic Atherosclerosis (ICD10-I70.0). Electronically Signed   By: Darliss Cheney M.D.   On: 07/29/2022 18:26   CT Head Wo Contrast  Result Date: 07/29/2022 CLINICAL DATA:  Mental status change, unknown cause. EXAM: CT HEAD WITHOUT CONTRAST TECHNIQUE: Contiguous axial images were obtained from the base of the skull through the vertex without intravenous contrast. RADIATION DOSE REDUCTION: This exam was performed according to the departmental dose-optimization program which includes automated exposure control, adjustment of the mA and/or kV according to patient size and/or use of iterative reconstruction technique. COMPARISON:  CT head dated July 15, 2022 FINDINGS: Brain: No evidence of acute infarction, hemorrhage, hydrocephalus, extra-axial collection or mass lesion/mass effect. Prominence of the ventricles and sulci secondary to moderate cerebral atrophy. Diffuse low-attenuation of the periventricular white matter presumed advanced chronic microvascular ischemic changes. Vascular: No hyperdense vessel or unexpected calcification. Skull: Normal. Negative for fracture or focal lesion. Sinuses/Orbits: No acute finding. Other: None. IMPRESSION: 1. No acute intracranial abnormality. 2. Moderate cerebral atrophy and advanced  chronic microvascular ischemic changes of the periventricular white matter. Electronically Signed   By: Larose Hires D.O.   On: 07/29/2022 18:22   Medications:  sodium chloride     dextrose 50 mL/hr at 07/31/22 0748   valproate sodium     valproate sodium      aspirin EC  81 mg Oral Daily   calcitRIOL  0.25 mcg Oral Daily   calcium acetate  667 mg Oral TID WC   Chlorhexidine Gluconate Cloth  6 each Topical Q0600   clopidogrel  75 mg Oral Daily   dextrose  25 g Intravenous STAT   [START ON 08/01/2022] heparin  3,700 Units Dialysis Once in dialysis   heparin injection (subcutaneous)  5,000 Units Subcutaneous Q8H   levothyroxine  250 mcg Oral Q0600   midodrine  10 mg Oral Q T,Th,Sa-HD   pantoprazole  40 mg Oral BID   rosuvastatin  10 mg Oral QHS   sodium chloride flush  3 mL Intravenous Q12H

## 2022-07-31 NOTE — Progress Notes (Deleted)
[facility] and Patient's Nurse called EEG at approximately 2:50PM to inform and to receive instructions on temporary disconnection of head box cable only so that patient could go to Dialysis. Nurse or [facility] monitoring should call back the EEG department when the Patient returns back from Dialysis. EEG third shift will be informed and given head up for re-connection to continue LTM EEG monitoring. Pt left for

## 2022-07-31 NOTE — Progress Notes (Signed)
LTM maint complete - no skin breakdown Patient has returned from dialysis, patient is  is slightly agitated at the moment . All leads seem to be attached.  Tech will try back later for better assessment of leads. [facility] monitored, Event button test confirmed by [facility].

## 2022-07-31 NOTE — Progress Notes (Signed)
Speech Language Pathology Treatment: Dysphagia  Patient Details Name: Chase [name] MRN: [id number] DOB: 03-04-1950 Today's Date: 07/31/2022 Time: 1219-1229 SLP Time Calculation (min) (ACUTE ONLY): 10 min  Assessment / Plan / Recommendation Clinical Impression  Pt seen today to assess safety with current diet after having a seizure yesterday and transitioning to a higher level of care. Upon SLP arrival, pt sleeping and occasionally producing verbal output, although mostly unintelligible. RN reports pt has not been able to sustain a level of alertness necessary to feed him a meal. She reports pt did not respond to straw sips of thin liquids, but did swallow teaspoonfuls of water with no s/s of aspiration. Pt intermittently responded to cues to open his eyes. He appeared to be in pain as he was positioned upright in bed, but did not respond to prompts to localize his pain. SLP performed oral care and attempted to present trials of thin liquids via straw, which blew through. Small teaspoonfuls of water were presented with Mod cueing and pt effectively initiated a swallow sequence without s/s of aspiration. He currently is not alert enough for current diet recommendation. Due to pt's altered mentation, recommend diet of Dys 1 textures and thin liquids only when he is fully awake and alert. Will continue to f/u as mentation improves to assess readiness for upgraded diet recommendations.     HPI HPI: Pt is a 72 yo male presenting to ED from [facility] 7/23 c/o abdominal pain after dialysis and AMS. Presents with dry gangrene of fingers s/p amputation. Admitted for arch aortogram from transfemoral approach with RUE arteriogram with R radial angioplasty 6/19 and subsequently underwent R ring and index finger amputation 7/11. PMH includes ESRD on HD TTS, CAD, hypothyroidism, HTN, anemia, depression/anxiety, GERD, T2DM, chronic back and neck pain      SLP Plan  Continue with current plan of care       Recommendations for follow up therapy are one component of a multi-disciplinary discharge planning process, led by the attending physician.  Recommendations may be updated based on patient status, additional functional criteria and insurance authorization.    Recommendations  Diet recommendations: Dysphagia 1 (puree);Thin liquid Liquids provided via: Teaspoon;Cup;Straw Medication Administration: Crushed with puree Supervision: Staff to assist with self feeding;Full supervision/cueing for compensatory strategies Compensations: Slow rate;Small sips/bites Postural Changes and/or Swallow Maneuvers: Seated upright 90 degrees;Upright 30-60 min after meal                  Oral care BID   Frequent or constant Supervision/Assistance Dysphagia, unspecified (R13.10)     Continue with current plan of care     Gwynneth Aliment, M.A., CF-SLP Speech Language Pathology, Acute Rehabilitation Services  Secure Chat preferred [phone]  07/31/2022, 12:39 PM

## 2022-07-31 NOTE — Progress Notes (Signed)
POST HD TX NOTE  07/31/22 1859  Vitals  Temp 98.3 F (36.8 C)  Temp Source Oral  BP (!) 140/66  MAP (mmHg) 86  BP Location Right Arm  BP Method Automatic  Patient Position (if appropriate) Lying  Pulse Rate (!) 27  Pulse Rate Source Monitor  ECG Heart Rate 97  Resp 17  Oxygen Therapy  SpO2 95 %  O2 Device Nasal Cannula  O2 Flow Rate (L/min) 2 L/min  Pulse Oximetry Type Continuous  During Treatment Monitoring  Intra-Hemodialysis Comments (S)   (post HD tx VS check)  Post Treatment  Dialyzer Clearance Lightly streaked  Duration of HD Treatment -hour(s) 3.5 hour(s)  Hemodialysis Intake (mL) 0 mL  Liters Processed 73.5  Fluid Removed (mL) 0 mL  Tolerated HD Treatment Yes  Post-Hemodialysis Comments (S)  tx completed w/o problem, UF goal met, blood rinsed back, VSS. Medication Admin: Heparin 2000 units pre tx bolus, Heparin 2000 units 2000 mid tx bolus  AVG/AVF Arterial Site Held (minutes) 4 minutes  AVG/AVF Venous Site Held (minutes) 4 minutes  Fistula / Graft Left Forearm  Placement Date/Time: (c) 10/25/20 (c) 0500   Placed prior to admission: Yes  Orientation: Left  Access Location: Forearm  Site Condition No complications  Fistula / Graft Assessment Bruit;Thrill;Present  Drainage Description None

## 2022-08-01 ENCOUNTER — Inpatient Hospital Stay (HOSPITAL_COMMUNITY): Payer: Medicare Other

## 2022-08-01 DIAGNOSIS — G9341 Metabolic encephalopathy: Secondary | ICD-10-CM | POA: Diagnosis not present

## 2022-08-01 DIAGNOSIS — I4891 Unspecified atrial fibrillation: Secondary | ICD-10-CM | POA: Diagnosis not present

## 2022-08-01 DIAGNOSIS — Z794 Long term (current) use of insulin: Secondary | ICD-10-CM | POA: Diagnosis not present

## 2022-08-01 DIAGNOSIS — G9340 Encephalopathy, unspecified: Secondary | ICD-10-CM | POA: Diagnosis not present

## 2022-08-01 DIAGNOSIS — E1169 Type 2 diabetes mellitus with other specified complication: Secondary | ICD-10-CM | POA: Diagnosis not present

## 2022-08-01 LAB — GLUCOSE, CAPILLARY
Glucose-Capillary: 103 mg/dL — ABNORMAL HIGH (ref 70–99)
Glucose-Capillary: 97 mg/dL (ref 70–99)
Glucose-Capillary: 104 mg/dL — ABNORMAL HIGH (ref 70–99)
Glucose-Capillary: 89 mg/dL (ref 70–99)
Glucose-Capillary: 102 mg/dL — ABNORMAL HIGH (ref 70–99)
Glucose-Capillary: 136 mg/dL — ABNORMAL HIGH (ref 70–99)
Glucose-Capillary: 88 mg/dL (ref 70–99)
Glucose-Capillary: 92 mg/dL (ref 70–99)

## 2022-08-01 LAB — TSH: TSH: 6.685 u[IU]/mL — ABNORMAL HIGH (ref 0.350–4.500)

## 2022-08-01 LAB — PHOSPHORUS: Phosphorus: 4.3 mg/dL (ref 2.5–4.6)

## 2022-08-01 LAB — MAGNESIUM: Magnesium: 1.8 mg/dL (ref 1.7–2.4)

## 2022-08-01 MED ORDER — PROSOURCE TF20 ENFIT COMPATIBL EN LIQD
60.0000 mL | Freq: Every day | ENTERAL | Status: DC
  Administered 2022-08-01 – 2022-08-04 (×4): 60 mL
  Filled 2022-08-01 (×4): qty 60

## 2022-08-01 MED ORDER — SODIUM CHLORIDE 0.9 % IV SOLN
25.0000 mg | Freq: Once | INTRAVENOUS | Status: AC
  Administered 2022-08-01: 25 mg via INTRAVENOUS
  Filled 2022-08-01: qty 1

## 2022-08-01 MED ORDER — QUETIAPINE FUMARATE 25 MG PO TABS: 25.0000 mg | ORAL_TABLET | Freq: Every day | ORAL | Status: DC

## 2022-08-01 MED ORDER — HALOPERIDOL LACTATE 5 MG/ML IJ SOLN
1.0000 mg | Freq: Four times a day (QID) | INTRAMUSCULAR | Status: DC | PRN
  Administered 2022-08-07 – 2022-08-08 (×4): 1 mg via INTRAVENOUS
  Filled 2022-08-01 (×5): qty 1

## 2022-08-01 MED ORDER — BACITRACIN ZINC 500 UNIT/GM EX OINT
TOPICAL_OINTMENT | Freq: Three times a day (TID) | CUTANEOUS | Status: DC
  Administered 2022-08-01 (×2): 1 via TOPICAL
  Administered 2022-08-01 – 2022-08-11 (×23): 31.5 via TOPICAL
  Filled 2022-08-01 (×2): qty 28.4

## 2022-08-01 MED ORDER — MAGNESIUM SULFATE IN D5W 1-5 GM/100ML-% IV SOLN
1.0000 g | Freq: Once | INTRAVENOUS | Status: AC
  Administered 2022-08-01: 1 g via INTRAVENOUS
  Filled 2022-08-01: qty 100

## 2022-08-01 MED ORDER — QUETIAPINE FUMARATE 25 MG PO TABS
25.0000 mg | ORAL_TABLET | Freq: Every day | ORAL | Status: DC
  Administered 2022-08-01 – 2022-08-09 (×9): 25 mg via ORAL
  Filled 2022-08-01 (×9): qty 1

## 2022-08-01 MED ORDER — OSMOLITE 1.5 CAL PO LIQD
1000.0000 mL | ORAL | Status: DC
  Administered 2022-08-01 – 2022-08-04 (×2): 1000 mL
  Filled 2022-08-01 (×7): qty 1000

## 2022-08-01 NOTE — Progress Notes (Signed)
Speech Language Pathology Treatment: Dysphagia  Patient Details Name: Chase [name] MRN: [id number] DOB: [birth year] Today's Date: 08/01/2022 Time: 4132-4401 SLP Time Calculation (min) (ACUTE ONLY): 15 min  Assessment / Plan / Recommendation Clinical Impression  Pt sleeping heavily upon SLP arrival, snoring loudly. Attempted to provide oral care, although pt pursed lips and kept his eyes closed despite frequent cueing. Presented ice chips to his lips without response. No further PO trials were attempted due to pt's lethargy. RN reports pt took medication orally yesterday, however, has not had a meal since first evaluation 7/23. Alternate means of nutrition may be beneficial to ensure pt is receiving adequate nutrition as he has not maintained a state of alertness adequate for POs. Will leave current diet of Dys 1 textures and thin liquids in place as pt has a functional swallow when fully awake and alert. Pt requires full supervision for any PO intake and should only be provided if pt is fully awake, alert, and positioned upright. Thorough oral care should be provided prior to POs. SLP will continue to f/u as able.   HPI HPI: Pt is a 72 yo male presenting to ED from [facility] 7/23 c/o abdominal pain after dialysis and AMS. Presents with dry gangrene of fingers s/p amputation. Admitted for arch aortogram from transfemoral approach with RUE arteriogram with R radial angioplasty 6/19 and subsequently underwent R ring and index finger amputation 7/11. PMH includes ESRD on HD TTS, CAD, hypothyroidism, HTN, anemia, depression/anxiety, GERD, T2DM, chronic back and neck pain      SLP Plan  Continue with current plan of care      Recommendations for follow up therapy are one component of a multi-disciplinary discharge planning process, led by the attending physician.  Recommendations may be updated based on patient status, additional functional criteria and insurance authorization.    Recommendations   Diet recommendations: Dysphagia 1 (puree);Thin liquid Liquids provided via: Teaspoon;Cup;Straw Medication Administration: Crushed with puree Supervision: Staff to assist with self feeding;Full supervision/cueing for compensatory strategies Compensations: Slow rate;Small sips/bites Postural Changes and/or Swallow Maneuvers: Seated upright 90 degrees;Upright 30-60 min after meal                  Oral care QID   Frequent or constant Supervision/Assistance Dysphagia, unspecified (R13.10)     Continue with current plan of care     Gwynneth Aliment, M.A., CF-SLP Speech Language Pathology, Acute Rehabilitation Services  Secure Chat preferred [phone]   08/01/2022, 9:45 AM

## 2022-08-01 NOTE — Progress Notes (Signed)
LTM  discontinued, no skin breakdown, [facility] notified

## 2022-08-01 NOTE — Progress Notes (Signed)
Physical Therapy Treatment Patient Details Name: Chase [name] MRN: [id number] DOB: 11-21-1950 Today's Date: 08/01/2022   History of Present Illness 72 y.o. male presenting to [facility] from dialysis 07/29/2022 with abdominal pain and confusion. 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 Pt had seizure like activity on 7/24 - EEG negative for seizure.  Pt with encephalopathy. PMH includes back pain, depression, CAD, DM, CKD, HTN, SDH, ESRD.    PT Comments  Since last PT treatment pt was transferred to progressive care due to seizure like activity - EEG was negative for seizures.  Pt was lethargic and difficult to arouse initially, but once transfer to EOB initiated , pt with improved alertness and was heavy mod A to transfer to EOB.  He was able to stand with mod A of 2 and took some side steps to [facility], but was getting lethargic again so was returned to supine.  All VSS during session on 2 L.  Goals and plan of care remain appropriate.     Assistance Recommended at Discharge Frequent or constant Supervision/Assistance  If plan is discharge home, recommend the following:  Can travel by private vehicle    A lot of help with walking and/or transfers;A lot of help with bathing/dressing/bathroom;Assistance with cooking/housework;Assistance with feeding;Direct supervision/assist for medications management;Direct supervision/assist for financial management;Assist for transportation;Help with stairs or ramp for entrance   No  Equipment Recommendations  Other (comment) (defer)    Recommendations for Other Services       Precautions / Restrictions Precautions Precautions: Fall Restrictions RUE Weight Bearing: Non weight bearing Other Position/Activity Restrictions: (NWB through 1st and 4th fingers)     Mobility  Bed Mobility Overal bed mobility: Needs Assistance Bed Mobility: Supine to Sit     Supine to sit: Mod assist     General bed mobility comments: Initially  requiring max A for legs toward EOB but then becoming more alert and assisting to lift trunk with heavy mod A    Transfers Overall transfer level: Needs assistance Equipment used: 2 person hand held assist Transfers: Sit to/from Stand Sit to Stand: Mod assist, +2 physical assistance           General transfer comment: Mod A of 2 to initiate standing with gait belt and pad but once standing able to maintain with min A with forward lean    Ambulation/Gait Ambulation/Gait assistance: Min assist, +2 physical assistance Gait Distance (Feet): 2 Feet Assistive device: 2 person hand held assist Gait Pattern/deviations: Step-to pattern, Shuffle Gait velocity: slowed     General Gait Details: side steps at EOB with faciliation for weight shift   Stairs             Wheelchair Mobility     Tilt Bed    Modified Rankin (Stroke Patients Only)       Balance Overall balance assessment: Needs assistance Sitting-balance support: Feet supported Sitting balance-Leahy Scale: Fair Sitting balance - Comments: needs close supervision and min guard at times   Standing balance support: Bilateral upper extremity supported, During functional activity Standing balance-Leahy Scale: Poor Standing balance comment: min A of 2 ; stood for ~30seconds                            Cognition Arousal/Alertness: Lethargic Behavior During Therapy: Flat affect Overall Cognitive Status: Difficult to assess  General Comments: Pt difficult to arouse initially.  With verbal cues and supine extremitiy movement pt would grunt and raise eyebrows but not open eyes.  With [facility] elevated and then transfer to EOB pt much more alert - holding eyes open, talking to wife and therapist, unable to state his name or his wife's name.  Follows commands with multimodal cues.  After being EOB for ~5 mins -lethargic again and difficulty keeping eyes open         Exercises General Exercises - Lower Extremity Ankle Circles/Pumps: AAROM, Both, 5 reps, Supine Heel Slides: AAROM, Both, 5 reps, Supine Hip ABduction/ADduction: AAROM, Both, 5 reps, Supine Shoulder Exercises Shoulder Flexion: AAROM, Both, 5 reps, Supine Other Exercises Other Exercises: Performed in supine, pt lethargic and limited assist provided by pt    General Comments General comments (skin integrity, edema, etc.): VSS on 2 L      Pertinent Vitals/Pain Pain Assessment Pain Assessment: No/denies pain    Home Living Family/patient expects to be discharged to:: Skilled nursing facility Living Arrangements: Spouse/significant other Available Help at Discharge: Other (Comment);Skilled Nursing Facility (Pt currently at [facility]. At home assistance is available PRN, wife works outside the home 4 days per week.) Type of Home: Apartment Home Access: Level entry       Home Layout: One level Home Equipment: Grab bars - tub/shower;Grab bars - toilet;Rolling Walker (2 wheels);Rollator (4 wheels);Cane - single point;Tub bench;Hand held shower head Additional Comments: Pt was at [facility] for short-term rehab from prior hospitalization with plan to return to there after acute discharge.    Prior Function            PT Goals (current goals can now be found in the care plan section) Progress towards PT goals: Progressing toward goals    Frequency    Min 1X/week      PT Plan Current plan remains appropriate    Co-evaluation              AM-PAC PT "6 Clicks" Mobility   Outcome Measure  Help needed turning from your back to your side while in a flat bed without using bedrails?: A Lot Help needed moving from lying on your back to sitting on the side of a flat bed without using bedrails?: A Lot Help needed moving to and from a bed to a chair (including a wheelchair)?: Total Help needed standing up from a chair using your arms (e.g., wheelchair or bedside chair)?:  Total Help needed to walk in hospital room?: Total Help needed climbing 3-5 steps with a railing? : Total 6 Click Score: 8    End of Session Equipment Utilized During Treatment: Gait belt Activity Tolerance: Patient limited by lethargy Patient left: with call bell/phone within reach;in bed;with bed alarm set;with family/visitor present Nurse Communication: Mobility status PT Visit Diagnosis: Other abnormalities of gait and mobility (R26.89);Unsteadiness on feet (R26.81)     Time: 1610-9604 PT Time Calculation (min) (ACUTE ONLY): 29 min  Charges:    $Therapeutic Exercise: 8-22 mins $Therapeutic Activity: 8-22 mins PT General Charges $$ ACUTE PT VISIT: 1 Visit                     Anise Salvo, PT Acute Rehab [facility] Rehab [phone]    Rayetta Humphrey 08/01/2022, 2:39 PM

## 2022-08-01 NOTE — Evaluation (Signed)
Occupational Therapy Evaluation Patient Details Name: Chase [name] MRN: [id number] DOB: [birth year] Today's Date: 08/01/2022   History of Present Illness 72 y.o. male presenting to [facility] from dialysis 07/29/2022 with abdominal pain and confusion. 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 PMH includes back pain, depression, CAD, DM, CKD, HTN, SDH, ESRD.   Clinical Impression   Just prior to this admission, pt was at [facility] for short-term rehab. While there, pt was performing UB ADLs Independent to Mod assist, LB ADLs with Mod to Max assist, and functional transfers/mobility household distances with a RW with Min assist. OT eval limited this day due to pt presenting with significant lethargy and fatigue. Pt currently presents with decreased alertness, B UE generalized weakness, decreased B UE fine and gross motor coordination, decreased cognition, decreased activity tolerance, and decreased safety and independence with ADLs and functional transfers/mobility. During OT eval, pt demonstrated need for Total assist +2 for all ADLs and bed mobility. However, pt demonstrated increased alertness and ability to transfer supine <-> sit and sit <-> stand with Mod assist +2 earlier this day with PT. OT to continue to assess pt functional level during next skilled OT sessions and will update OT plan of care as appropriate. Pt will benefit from acute skilled OT services to address deficits outlined below, decrease caregiver burden, and increase safety and independence with ADLs, bed mobility during/in preparation for functional tasks, and functional transfers. Post acute discharge, pt will benefit from continued intensive inpatient skilled rehab services < 3 hours per day to maximize rehab potential.      Recommendations for follow up therapy are one component of a multi-disciplinary discharge planning process, led by the attending physician.  Recommendations may be updated based on patient  status, additional functional criteria and insurance authorization.   Assistance Recommended at Discharge Frequent or constant Supervision/Assistance  Patient can return home with the following Two people to help with walking and/or transfers;Two people to help with bathing/dressing/bathroom;Assistance with cooking/housework;Assistance with feeding;Direct supervision/assist for medications management;Direct supervision/assist for financial management;Assist for transportation;Help with stairs or ramp for entrance    Functional Status Assessment  Patient has had a recent decline in their functional status and demonstrates the ability to make significant improvements in function in a reasonable and predictable amount of time.  Equipment Recommendations  Other (comment) (Defer to next level of care)    Recommendations for Other Services       Precautions / Restrictions Precautions Precautions: Fall Restrictions Weight Bearing Restrictions: Yes RUE Weight Bearing: Non weight bearing      Mobility Bed Mobility Overal bed mobility: Needs Assistance Bed Mobility: Rolling Rolling: Total assist, +2 for physical assistance, +2 for safety/equipment         General bed mobility comments: Pt requiring Total assist for rolling in bed this session secondary to lethergy and fatigue. Earlier this day, pt transferred to/from EOB with PT with Mod assist +2.    Transfers Overall transfer level: Needs assistance                 General transfer comment: Deferred this session for pt/therapist safety. Pt requiring Total assist for rolling in bed this session secondary to lethergy and fatigue. Earlier this day, pt transferred sit/stand with PT with Mod assist +2.      Balance Overall balance assessment: Needs assistance Sitting-balance support: Feet supported Sitting balance-Leahy Scale: Fair Sitting balance - Comments: Per PT report from earlier this day.   Standing balance support:  Bilateral upper extremity supported, During functional activity Standing balance-Leahy Scale: Poor Standing balance comment: Per PT report from earlier this day                           ADL either performed or assessed with clinical judgement   ADL Overall ADL's : Needs assistance/impaired Eating/Feeding: Total assistance Eating/Feeding Details (indicate cue type and reason): Currently receiving feedings through Cortrak Grooming: Total assistance;Bed level   Upper Body Bathing: Total assistance;Bed level   Lower Body Bathing: Total assistance;+2 for physical assistance;+2 for safety/equipment;Bed level   Upper Body Dressing : Total assistance;Bed level   Lower Body Dressing: Total assistance;+2 for physical assistance;+2 for safety/equipment;Bed level     Toilet Transfer Details (indicate cue type and reason): deferred this sesison for pt and therapist safety Toileting- Clothing Manipulation and Hygiene: Total assistance;+2 for physical assistance;+2 for safety/equipment;Bed level               Vision Baseline Vision/History: 1 Wears glasses (had a pair for reading and a pair for distances, but has not been using them resently) Patient Visual Report: Other (comment) (Pt unable to report.)       Perception     Praxis      Pertinent Vitals/Pain Pain Assessment Pain Assessment: Faces Pain Score: 0-No pain Pain Intervention(s): Monitored during session     Hand Dominance Right (Now predominantly uses Left following recent finger amputations.)   Extremity/Trunk Assessment Upper Extremity Assessment Upper Extremity Assessment: Generalized weakness;RUE deficits/detail;LUE deficits/detail RUE Deficits / Details: Recent amputation of R 2nd and 4th fingers. Gernealized weakness. Impaired PROM/AAROM shoulder flexion to appox. 90 degrees with wife reporting decreased B shoulder AROM over the past approx. year. Pt with limited ability to participate in AAROM of R UE  secondary to lethergy and fatigue. RUE Coordination: decreased fine motor;decreased gross motor LUE Deficits / Details: Gernealized weakness. Impaired PROM/AAROM shoulder flexion to appox. 100 degrees with wife reporting decreased B shoulder AROM over the past approx. year. Pt with limited ability to participate in AAROM of R UE secondary to lethergy and fatigue. LUE Coordination: decreased fine motor;decreased gross motor   Lower Extremity Assessment Lower Extremity Assessment: Defer to PT evaluation   Cervical / Trunk Assessment Cervical / Trunk Assessment: Kyphotic   Communication Communication Communication: Expressive difficulties;Receptive difficulties (Difficult to fully assess due to level of alertness. Requires increased time for processing. Pt with no vocalizations this session.)   Cognition Arousal/Alertness: Lethargic Behavior During Therapy: Flat affect Overall Cognitive Status: Difficult to assess                                 General Comments: Pt difficult to arouse. Pt largely keeping eyes closed throughout session and had no vocalizations. However, pt made good attempt to follow 1 step commands and participate in AAROM of B UE.     General Comments  VSS on 2L continuous O2 through nasal cannula throughout session. Pt's wife and sitter present during sesison. RN present during a portion of session. OT eval limited by pt persenting with significant lethergy and fatiogue this session.    Exercises Exercises: General Upper Extremity General Exercises - Upper Extremity Shoulder Flexion: AAROM, PROM, Both, 5 reps, Supine Shoulder Extension: AAROM, PROM, Both, 5 reps, Supine Elbow Flexion: AAROM, PROM, Both, 5 reps, Supine Elbow Extension: AAROM, PROM, Both, 5 reps, Supine Wrist Flexion: AAROM, PROM, Both,  5 reps, Supine Wrist Extension: AAROM, PROM, Both, 5 reps, Supine Other Exercises Other Exercises: OT educated pt's wife on PROM/AAROM B shoulder  flexion/extension, elbow flexion/extension, and forearm supination/pronation to be completed with pt daily for 10 reps each exercise 3x per day as tolerated and spread throughout day as needed to maintain joint integrity and encourage active movement with wife demonstrating understanding and agreement through teach back.   Shoulder Instructions      Home Living Family/patient expects to be discharged to:: Skilled nursing facility Living Arrangements: Spouse/significant other Available Help at Discharge: Other (Comment);Skilled Nursing Facility (Pt currently at [facility]. At home assistance is available PRN, wife works outside the home 4 days per week.) Type of Home: Apartment Home Access: Level entry     Home Layout: One level     Bathroom Shower/Tub: Chief Strategy Officer: Standard Bathroom Accessibility: Yes How Accessible: Accessible via walker Home Equipment: Grab bars - tub/shower;Grab bars - toilet;Rolling Walker (2 wheels);Rollator (4 wheels);Cane - single point;Tub bench;Hand held shower head   Additional Comments: Pt was at [facility] for short-term rehab from prior hospitalization with plan to return to there after acute discharge.      Prior Functioning/Environment Prior Level of Function : Needs assist             Mobility Comments: Pt's wife reports just prior to this admission, pt was ambulating to/from bathroom with assist of staff. Ambulating 90-100' with PT at SNF using RW per chart review from prior admission. ADLs Comments: Per wife's report, just prior to this admission, pt required Mod to Max assist with dressing, bathing, and toileting hygeine. Pt was completeing self feeding and grooming with Set up. Pt completeing funcitonal trasnfers with a RW with Min assist. Per wife's report, pt was completing ADLs Mod I approx. 6 months ago. Pt receiving OT services at [facility].        OT Problem List: Decreased strength;Decreased range of  motion;Decreased activity tolerance;Impaired balance (sitting and/or standing);Decreased coordination;Decreased cognition;Impaired UE functional use      OT Treatment/Interventions: Self-care/ADL training;Therapeutic exercise;DME and/or AE instruction;Therapeutic activities;Cognitive remediation/compensation;Patient/family education;Balance training    OT Goals(Current goals can be found in the care plan section) Acute Rehab OT Goals Patient Stated Goal: Pt unable to state OT Goal Formulation: With family Time For Goal Achievement: 08/15/22 Potential to Achieve Goals: Good ADL Goals Pt Will Perform Grooming: with supervision;sitting (sitting EOB for 5 or more minutes) Pt Will Transfer to Toilet: with min assist;bedside commode;ambulating (with least restrictive AD) Pt/caregiver will Perform Home Exercise Program: Increased ROM;Increased strength;Both right and left upper extremity;With Supervision;With written HEP provided (AROM) Additional ADL Goal #1: Patient will demonstrate ability to complete 3 step therapeutic or funcitonal task Mod I while demonstrating Selective attention and Emergent awareness.  OT Frequency: Min 1X/week    Co-evaluation              AM-PAC OT "6 Clicks" Daily Activity     Outcome Measure Help from another person eating meals?: Total Help from another person taking care of personal grooming?: Total Help from another person toileting, which includes using toliet, bedpan, or urinal?: Total Help from another person bathing (including washing, rinsing, drying)?: Total Help from another person to put on and taking off regular upper body clothing?: Total Help from another person to put on and taking off regular lower body clothing?: Total 6 Click Score: 6   End of Session Nurse Communication: Mobility status;Other (comment) (Pt with significant  lethergy and fatigue this session.)  Activity Tolerance: Patient limited by fatigue;Patient limited by  lethargy Patient left: in bed;with call bell/phone within reach;with nursing/sitter in room;with family/visitor present  OT Visit Diagnosis: Other abnormalities of gait and mobility (R26.89);Muscle weakness (generalized) (M62.81);Other (comment) (Decreased activity tolerance)                Time: 1706-1730 OT Time Calculation (min): 24 min Charges:  OT General Charges $OT Visit: 1 Visit OT Evaluation $OT Eval Moderate Complexity: 1 Mod  [address] OTR/L, MA Acute Rehab [phone]   Lendon Colonel 08/01/2022, 7:13 PM

## 2022-08-01 NOTE — Care Management Important Message (Signed)
Important Message  Patient Details  Name: Orbie Picha MRN: [id number] Date of Birth: 08/26/1950   Medicare Important Message Given:  Yes     Dorena Bodo 08/01/2022, 1:52 PM

## 2022-08-01 NOTE — Progress Notes (Signed)
Nutrition Follow-up  DOCUMENTATION CODES:   Not applicable  INTERVENTION:  Initiate tube feeds via Cortrak: -Initiate Osmolite 1.5 at 20 mL/hour and advance by 20 mL/hour every 8 hours to goal rate of 60 mL/hour (1440 mL goal daily volume) -Provide PROSource TF20 60 mL once daily per tube -Provides: 2240 kcal, 110 grams of protein, 1094 mL H2O daily  Per SLP note can leave current diet of dysphagia 1 with thin liquids in place. Will require full supervision of PO intake and should only be provided if pt fully awake, alert, and positioned upright.  NUTRITION DIAGNOSIS:   Inadequate oral intake related to inability to eat as evidenced by other (comment) (mental status).  Ongoing - pt unable to eat due to mental status.  GOAL:   Patient will meet greater than or equal to 90% of their needs  Progressing with initiation of tube feeds today.  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 He is unable to safely eat or drink. Met with patient's wife at bedside. She reports PTA patient had an excellent appetite and intake. She reports he would eat 3 well-balanced meals daily. Wife endorses pt has been unable to eat or drink here. There is one meal documented on 7/24 where pt had 20% of a meal, but no other documented intake. Discussed with patient's wife plan for placement of Cortrak tube. Described tube and process of tube feeding.  Wife reports pt has been weight stable and  did not have any weight loss PTA. Currently documented to be 86.2 kg on 7/25. EDW per Nephrology note is 99 kg. Recommend continuing to monitor weight trends. Noted outpatient weights have been 84-90 kg. Question if scales may be measuring differently.  Medications reviewed and include: calcitriol, Phoslo 667 mg TID with meals, levothyroxine, pantoprazole, D5W at 50 mL/hour, magnesium sulfate 1 gram once IV today, valproate  Labs reviewed: CBG 89-104, Sodium 132, Chloride 95, Magnesium 1.4, Potassium and Phosphorus WNL  Enteral Access: 10 Fr. Cortrak tube placed 7/26 right nare; tip is in the region of the pylorus per abdominal x-ray 7/26  UOP: 250 mL (0.1 mL/kg/hr) in previous 24 hours  I/O: +1769 mL since admission  Discussed with MD and RN via secure chat. Plan is for placement of Cortrak tube today and initiation of tube feeds.  Diet Order:   Diet Order             DIET - DYS 1 Room service appropriate? Yes with Assist; Fluid consistency: Thin  Diet effective now                  EDUCATION NEEDS:   Education needs have been addressed  Skin:  Skin Assessment: Skin Integrity Issues: Skin Integrity Issues:: Other (Comment) Other: gangrene right finger; abrasion left distal thigh  Last BM:  08/01/22 - large type 1  Height:   Ht Readings from Last 1 Encounters:  07/29/22 5\' 6"  (1.676 m)   Weight:   Wt Readings from Last 1  Encounters:  07/31/22 86.2 kg   BMI:  Body mass index is 30.67 kg/m.  Estimated Nutritional Needs:   Kcal:  2050-2300  Protein:  105-120 grams  Fluid:  UOP + 1 L  Chase [name] Tollie Eth, MS, RD, LDN, CNSC Pager number available on Amion

## 2022-08-01 NOTE — Progress Notes (Signed)
Echocardiogram 2D Echocardiogram has been performed.  Chase [name] 08/01/2022, 11:47 AM

## 2022-08-01 NOTE — Progress Notes (Signed)
DOS 07/31/2022 1850  EEG recording restarted , pt agitated will try back for maintenance as time allows.

## 2022-08-01 NOTE — Progress Notes (Addendum)
Neurology progress note  Subjective: Remains unchanged Multiple episodes of hypoglycemia overnight.  Exam: Vitals:   08/01/22 1300 08/01/22 1601  BP: (!) 105/41 (!) 110/50  Pulse: 63   Resp: 19   Temp: (!) 97.5 F (36.4 C) 97.7 F (36.5 C)  SpO2: 90%    Gen: In bed, NAD Resp: non-labored breathing, no acute distress Abd: soft, nt Ext: right fingers are reddened and swollen  Neuro: Remains very obtunded. Does not open to voice Does not follow To noxious stimulation lenses and grimaces Pupils equal round react light   Pertinent Labs: CBC    Component Value Date/Time   WBC 7.0 08/01/2022 0219   RBC 3.27 (L) 08/01/2022 0219   HGB 9.7 (L) 08/01/2022 0219   HCT 30.8 (L) 08/01/2022 0219   PLT 133 (L) 08/01/2022 0219   MCV 94.2 08/01/2022 0219   MCH 29.7 08/01/2022 0219   MCHC 31.5 08/01/2022 0219   RDW 14.8 08/01/2022 0219   LYMPHSABS 0.7 08/01/2022 0219   MONOABS 0.6 08/01/2022 0219   EOSABS 0.3 08/01/2022 0219   BASOSABS 0.0 08/01/2022 0219      Latest Ref Rng & Units 08/01/2022    2:19 AM 07/31/2022    9:25 AM 07/30/2022    8:14 AM  BMP  Glucose 70 - 99 mg/dL 90  960  84   BUN 8 - 23 mg/dL 21  54  46   Creatinine 0.61 - 1.24 mg/dL 4.54  0.98  1.19   Sodium 135 - 145 mmol/L 132  133  134   Potassium 3.5 - 5.1 mmol/L 3.8  4.8  4.8   Chloride 98 - 111 mmol/L 95  98  98   CO2 22 - 32 mmol/L 25  22  22    Calcium 8.9 - 10.3 mg/dL 8.4  8.7  8.9     LTM over 2 days has not captured any seizures.  Imaging personally reviewed: CT head today with a possible questionable focal hypodense lesion in the left Hemi pons-could be age-indeterminate infarct versus artifact.     Impression: Chase [name] is a 72 y.o. 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  On admission patient was alert and oriented to self only not at baseline per wife.  7/24 patient had a witnessed seizure. CTH and EEG Neg.  Exam: Remains very encephalopathic. -Toxic metabolic encephalopathy  - ?acute on chronic worsening of renal function, ?d/t infectious process -Seizure -?DDS -CT head with concern for hypodensity in the left pons-age-indeterminate infarct versus artifact-no clear clinical correlate hence more likely to be artifact.  Cannot get MRI due to spinal stimulator   Recommendations: DC LTM as no seizures in 2 days Depakote started yesterday.  Continue for now.   IVF while NPO to avoid dehydration which can trigger seizures/increased toxic-metabolic encephalopathy effects Infection mgmt per primary Management of hypoglycemia and surgical primary team We will continue to follow-I do not remove a clear answer on why his mentation is poor  D/W Dr Randol Kern Please feel free to call with any questions.   ADDENDUM CTH with ?left pons hypodensity. Very artifcat prone area. Will repeat [facility] tomorrow.  -- [name], MD Neurologist [facility] Pager: [phone]

## 2022-08-01 NOTE — TOC Progression Note (Signed)
Transition of Care [facility]) - Progression Note    Patient Details  Name: Chase [name] MRN: [id number] Date of Birth: [birth year]  [facility] CM/SW Contact  Mearl Latin, LCSW Phone Number: 08/01/2022, 3:54 PM  Clinical Narrative:    CSW continuing to follow.    Expected Discharge Plan: Skilled Nursing Facility Barriers to Discharge: Continued Medical Work up  Expected Discharge Plan and Services In-house Referral: Clinical Social Work   Post Acute Care Choice: Skilled Nursing Facility, Dialysis Living arrangements for the past 2 months: Skilled Nursing Facility                                       Social Determinants of Health (SDOH) Interventions SDOH Screenings   Food Insecurity: No Food Insecurity (07/16/2022)  Housing: Low Risk  (07/16/2022)  Transportation Needs: No Transportation Needs (07/16/2022)  Utilities: Not At Risk (07/16/2022)  Financial Resource Strain: Low Risk  (09/16/2021)   Received from [facility], [facility]  Physical Activity: Inactive (09/16/2021)   Received from [facility], [facility]  Social Connections: Socially Integrated (09/16/2021)   Received from [facility], [facility]  Stress: Patient Declined (09/16/2021)   Received from [facility], [facility]  Tobacco Use: Low Risk  (07/29/2022)    Readmission Risk Interventions    07/31/2022    3:30 PM 07/16/2022    3:22 PM  Readmission Risk Prevention Plan  Transportation Screening Complete Complete  PCP or Specialist Appt within 5-7 Days  Complete  Home Care Screening  Complete  Medication Review (RN CM)  Complete  Medication Review (RN Care Manager) Complete   PCP or Specialist appointment within 3-5 days of discharge Complete   HRI or Home Care Consult Complete   SW Recovery Care/Counseling Consult Complete   Palliative Care Screening Not Applicable   Skilled Nursing Facility Complete

## 2022-08-01 NOTE — Progress Notes (Signed)
[name] Progress Note   Subjective:  Seen in room, wife at bedside. He is restless, remains confused, was pulling on NG tube this AM, so now in mittens. Loss of bowel control. S/p HD yesterday without any improvement in AMS. Head CT this AM showed possible age-indeterminate lesion in pons.   Objective Vitals:   07/31/22 1847 07/31/22 1859 07/31/22 2002 08/01/22 0020  BP: (!) 143/70 (!) 140/66 120/67 125/79  Pulse: 76 (!) 27 83 (!) 103  Resp: 18 17 (!) 22 (!) 21  Temp:  98.3 F (36.8 C) 98 F (36.7 C) (!) 97.1 F (36.2 C)  TempSrc:  Oral Axillary Axillary  SpO2: 100% 95% 96% 93%  Weight:  86.2 kg    Height:       Physical Exam General: Restless, confused. Hands in mittens.  Heart:RRR; no murmur Lungs: CTAB; no rales Abdomen: soft. Having BM during my exam. Extremities: no LE edema. 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 Dialysis Access: L forearm AVF + bruit  Additional Objective Labs: Basic Metabolic Panel: Recent Labs  Lab 07/30/22 0814 07/31/22 0925 08/01/22 0219  NA 134* 133* 132*  K 4.8 4.8 3.8  CL 98 98 95*  CO2 22 22 25   GLUCOSE 84 146* 90  BUN 46* 54* 21  CREATININE 5.85* 6.57* 3.56*  CALCIUM 8.9 8.7* 8.4*  PHOS 5.2* 6.5* 3.7   Liver Function Tests: Recent Labs  Lab 07/29/22 1438 07/30/22 0814 07/31/22 0925  AST 13* 10*  --   ALT 7 7  --   ALKPHOS 116 99  --   BILITOT 0.8 0.5  --   PROT 6.9 6.2*  --   ALBUMIN 2.9* 2.6* 2.5*   Recent Labs  Lab 07/29/22 1438  LIPASE 26   CBC: Recent Labs  Lab 07/29/22 1438 07/30/22 0244 07/30/22 0814 07/31/22 0925 08/01/22 0219  WBC 7.4  --  6.3 13.3* 7.0  NEUTROABS 5.6  --   --   --  5.3  HGB 10.7*   < > 9.7* 9.4* 9.7*  HCT 35.2*   < > 32.1* 30.6* 30.8*  MCV 98.6  --  95.3 97.1 94.2  PLT 183  --  154 148* 133*   < > = values in this interval not displayed.   Blood Culture    Component Value Date/Time   SDES BLOOD SITE NOT SPECIFIED 07/30/2022 1602   SDES  BLOOD SITE NOT SPECIFIED 07/30/2022 1602   SPECREQUEST  07/30/2022 1602    BOTTLES DRAWN AEROBIC AND ANAEROBIC Blood Culture adequate volume   SPECREQUEST  07/30/2022 1602    BOTTLES DRAWN AEROBIC AND ANAEROBIC Blood Culture adequate volume   CULT  07/30/2022 1602    NO GROWTH 2 DAYS Performed at [facility], [address], [place], [place] [address]    CULT  07/30/2022 1602    NO GROWTH 2 DAYS Performed at [facility], [address], [place], [place] [address]    REPTSTATUS PENDING 07/30/2022 1602   REPTSTATUS PENDING 07/30/2022 1602   Studies/Results: CT HEAD WO CONTRAST ( )  Result Date: 08/01/2022 CLINICAL DATA:  Mental status change, unknown cause EXAM: CT HEAD WITHOUT CONTRAST TECHNIQUE: Contiguous axial images were obtained from the base of the skull through the vertex without intravenous contrast. RADIATION DOSE REDUCTION: This exam was performed according to the departmental dose-optimization program which includes automated exposure control, adjustment of the mA and/or kV according to patient size and/or use of iterative  reconstruction technique. COMPARISON:  CT head 07/30/22, 12/15/21 FINDINGS: Brain: No evidence of acute infarction, hemorrhage, hydrocephalus, extra-axial collection or mass lesion/mass effect. There is sequela of moderate chronic microvascular ischemic change. 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 Possible focal hypodense lesion in the left hemi pons (series 3, image 12-10). Vascular: No hyperdense vessel or unexpected calcification. There are dense calcifications in the V4 segments of bilateral vertebral arteries. Skull: Normal. Negative for fracture or focal lesion. Sinuses/Orbits: No middle ear or mastoid effusion. Paranasal sinuses are clear. Bilateral lens replacement. Orbits are otherwise unremarkable. Other: None. IMPRESSION: Possible focal hypodense lesion in the left hemi pons, which could  represent an age-indeterminate infarct. Consider further evaluation with brain MRI if this is a clinical concern. These results will be called to the ordering clinician or representative by the Radiologist Assistant, and communication documented in the PACS or [employer]. Electronically Signed   By: Lorenza Cambridge M.D.   On: 08/01/2022 09:13   DG Hand 2 View Right  Result Date: 07/31/2022 CLINICAL DATA:  Right hand pain. EXAM: RIGHT HAND - 2 VIEW COMPARISON:  Right hand radiographs 07/15/2022 FINDINGS: Interval amputation of the fourth finger to the distal shaft of the proximal phalanx. Interval amputation of the index finger to the proximal shaft of the middle phalanx. The resection margins are within normal limits given the recent surgery since 07/15/2022. Note is made that the soft tissues of the fourth digit were atrophic distally. High-grade extensive vascular calcifications are again seen. 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 IMPRESSION: 1. Interval amputation of the fourth finger to the distal shaft of the proximal phalanx. 2. Interval amputation of the index finger to the proximal shaft of the middle phalanx. 3. High-grade extensive vascular calcifications. Electronically Signed   By: Neita Garnet M.D.   On: 07/31/2022 10:07   Overnight EEG with video  Result Date: 07/31/2022 Charlsie Quest, MD     08/01/2022  9:29 AM Patient Name: Chase [name] MRN: [id number] Epilepsy Attending: Charlsie Quest Referring Physician/Provider: [name], MD  Duration: 07/30/2022 1337 to 07/31/2022 2100 Patient history:  72 y.o. male with history of traumatic head injury presented to the ED from dialysis complaining of abdominal pain for 1 day 7/23.  On admission patient  was alert and oriented to self only not at baseline per wife.  7/24 patient had a witnessed seizure described as right-sided weakness, right facial droop. EEG to evaluate for seizure Level of alertness:  lethargic AEDs during EEG study: LEV Technical aspects: This EEG study was done with scalp electrodes positioned according to the 10-20 International system of electrode placement. Electrical activity was reviewed with band pass filter of 1-70Hz , sensitivity of 7 uV/mm, display speed of 51mm/sec with a 60Hz  notched filter applied as appropriate. EEG data were recorded continuously and digitally stored.  Video monitoring was available and reviewed as appropriate. Description: EEG showed continuous generalized polymorphic sharply contoured 3 to 6 Hz theta-delta slowing, at times with triphasic morphology. Hyperventilation and photic stimulation were not performed.  EEG was disconnected between 07/30/2022 at 1637 to 1844  and 07/31/2022 1433 to 1928 due to technical reasons. ABNORMALITY - Continuous slow, generalized IMPRESSION: This study is suggestive of moderate to severe diffuse encephalopathy, nonspecific etiology but likely related to toxic-metabolic  causes. No seizures or definite epileptiform discharges were seen throughout the recording. Priyanka Annabelle Harman   CT HEAD WO CONTRAST ( )  Result Date: 07/30/2022 CLINICAL DATA:  Neuro deficit, acute, stroke suspected. " Right-sided weakness, not localizing to that side but is to the left side." EXAM: CT HEAD WITHOUT CONTRAST TECHNIQUE: Contiguous axial images were obtained from the base of the skull through the vertex without intravenous contrast. RADIATION DOSE REDUCTION: This exam was performed according to the departmental dose-optimization program which includes automated exposure control, adjustment of the mA and/or kV according to patient size and/or use of iterative reconstruction technique. COMPARISON:  07/29/2022 FINDINGS: Brain: Advanced brain atrophy. No  focal abnormality seen affecting the brainstem. No focal cerebellar finding. Cerebral hemispheres show chronic small-vessel ischemic changes of the white matter. No sign of acute infarction. No mass, hemorrhage, hydrocephalus or extra-axial collection. Vascular: There is atherosclerotic calcification of the major vessels at the base of the brain. Skull: Negative Sinuses/Orbits: Clear/normal Other: None IMPRESSION: No acute CT finding. Advanced brain atrophy. Chronic small-vessel ischemic changes of the cerebral hemispheric white matter. The exam suffers from some motion degradation and streak artifact from what are probably EEG monitoring devices. Electronically Signed   By: Paulina Fusi M.D.   On: 07/30/2022 17:28   Medications:  sodium chloride     dextrose 50 mL/hr at 08/01/22 0606   magnesium sulfate bolus IVPB 1 g (08/01/22 1216)   valproate sodium 500 mg (08/01/22 0943)    aspirin EC  81 mg Oral Daily   bacitracin   Topical TID   calcitRIOL  0.25 mcg Oral Daily   calcium acetate  667 mg Oral TID WC   Chlorhexidine Gluconate Cloth  6 each Topical Q0600   clopidogrel  75 mg Oral Daily   darbepoetin (ARANESP) injection - DIALYSIS  60 mcg Subcutaneous Q Thu-1800   heparin injection (subcutaneous)  5,000 Units Subcutaneous Q8H   levothyroxine  250 mcg Oral Q0600   midodrine  10 mg Oral Q T,Th,Sa-HD   pantoprazole  40 mg Oral BID   QUEtiapine  25 mg Per Tube QHS   rosuvastatin  10 mg Oral QHS   sodium chloride flush  3 mL Intravenous Q12H   Dialysis Orders: TTS at [facility] 3.5hr, 350/700, EDW 99kg, 2K, AVF, heparin 3700 bolus + 500U/hr pump - HD stopped 7/23 due to acute agitation, had been well prior to HD on 7/23 - Epogen 7600 units q HD  Assessment/Plan: 1. AMS: Acutely started during dialysis on 7/23 - no clear etiology. No new meds. Blood Cx 7/24 negative. Does have baseline memory loss and occ confusion per wife, but this is much worse. Initial head CT negative except chronic brain  atrophy, repeat 7/26 showed possible lesion in pons - unable to have MRI d/t spinal stimulator. Had seizure-like activity on 7/24 - started on Keppra. EEG c/w diffuse metabolic encephalopathy. Has chronic back pain with spinal stimulator and on chronic narcotics - on hold. NG tube inserted, on mittens as he was trying to remove. 2. ESRD: on HD TTS - has not missed HD, BUN low - this is not uremia. Symptoms not improved with HD. Next HD tomorrow. 3. HTN/volume: BP ok, not eating - now with NG tube. gets midodrine pre-HD. 4. Anemia: Hgb 9.7 - continue Aranesp weekly while here. 5. Secondary hyperparathyroidism:  Ca/Phos ok - continue home meds for now. 6. Nutrition: Alb low - not eating - now has NG tube. 7. 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 Abd pain: On admit - CT abdomen 7/23 without acute infection.  Ozzie Hoyle, PA-C 08/01/2022, 12:34 PM  [facility]

## 2022-08-01 NOTE — Progress Notes (Signed)
PROGRESS NOTE    Chase [name]  ZOX:[id number] DOB: 02-16-1950 DOA: 07/29/2022 PCP: [name], MD    Brief Narrative:  72 y.o. male with medical history significant of  ESRD on HD TTS, CAD, hypothyroidism, hypertension, anemia of chronic disease, depression/anxiety, GERD, type 2 diabetes, chronic back and neck pain.  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 Admitted for acute encephalopathy.  Had seizure-like activity on 7/24.    Assessment and Plan:  ESRD (end stage renal disease) (HCC) -nephrology consulted he received HD yesterday,   Suspected seizure -neuro consult, remains on LTM, AED per renal.  Acute metabolic encephalopathy - most likely multifactorial secondary to  polypharmacy and underlining dementia vs infection vs seizure - unable to have MRI of the brain  -Hold off on oxycodone -Possible hospital delirium as well, will start on Seroquel, will keep on as needed haloperidol -Significantly altered, unable to eat or drink, will insert cortrack and start on tube feed  Diabetes mellitus (HCC) BG has been running low, he remains on D5W, will be started on tube feed so that should help, -follow accu checks   Low magnesium -replete  Hypothyroidism Continue synthroid at home dose  Hypomagnesemia -Repleted    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 Yehuda Budd during recent admission -Will order bacitracin ointment   PAD continue  Plavix   DVT prophylaxis: heparin injection 5,000 Units Start: 07/31/22 1400 SCDs Start: 07/30/22 0746    Code Status: Full Code Family Communication: none at bedside  Disposition Plan:  Level of care: Progressive Status is: Observation The patient will require care spanning > 2 midnights and should be moved to inpatient     Consultants:  Renal neuro   Subjective:  Ms. confused,  with agitation overnight as discussed with staff  Objective: Vitals:   07/31/22 1847 07/31/22 1859 07/31/22 2002 08/01/22 0020  BP: (!) 143/70 (!) 140/66 120/67 125/79  Pulse: 76 (!) 27 83 (!) 103  Resp: 18 17 (!) 22 (!) 21  Temp:  98.3 F (36.8 C) 98 F (36.7 C) (!) 97.1 F (36.2 C)  TempSrc:  Oral Axillary Axillary  SpO2: 100% 95% 96% 93%  Weight:  86.2 kg    Height:        Intake/Output Summary (Last 24 hours) at 08/01/2022 1128 Last data filed at 08/01/2022 0436 Gross per 24 hour  Intake 1416.96 ml  Output 250 ml  Net 1166.96 ml   Filed Weights   07/31/22 1451 07/31/22 1500 07/31/22 1859  Weight: 86.2 kg 86.2 kg 86.2 kg    Examination:   Lethargic, altered, confused, unable to answer any questions or follow any commands Awake Alert, Oriented X 3, No new F.N deficits, Normal affect Symmetrical Chest wall movement, Good air movement bilaterally, CTAB RRR,No Gallops,Rubs or new Murmurs, No Parasternal Heave +ve B.Sounds, Abd Soft, No tenderness, No rebound - guarding or rigidity. Right hand second and fourth digit amputation   Data Reviewed: I have personally reviewed following labs and imaging studies  CBC: Recent Labs  Lab 07/29/22 1438 07/30/22 0244 07/30/22 0814 07/31/22 0925 08/01/22 0219  WBC 7.4  --  6.3 13.3* 7.0  NEUTROABS 5.6  --   --   --  5.3  HGB 10.7* 10.9* 9.7* 9.4* 9.7*  HCT 35.2* 32.0* 32.1* 30.6* 30.8*  MCV 98.6  --  95.3 97.1 94.2  PLT 183  --  154 148* 133*   Basic Metabolic Panel: Recent Labs  Lab 07/29/22 1438 07/30/22 0244 07/30/22 0814 07/31/22 0925 08/01/22 0219  NA 136 136 134* 133* 132*  K 4.6 4.7 4.8 4.8 3.8  CL 98  --  98 98 95*  CO2 24  --  22 22 25   GLUCOSE 87  --  84 146* 90  BUN 40*  --  46* 54* 21  CREATININE 5.01*  --  5.85* 6.57* 3.56*  CALCIUM 9.4  --  8.9 8.7* 8.4*  MG  --   --  1.4*  --  1.4*  PHOS  --   --  5.2* 6.5* 3.7   GFR: Estimated Creatinine Clearance: 19.9 mL/min (A) (by C-G formula based on  SCr of 3.56 mg/dL (H)). Liver Function Tests: Recent Labs  Lab 07/29/22 1438 07/30/22 0814 07/31/22 0925  AST 13* 10*  --   ALT 7 7  --   ALKPHOS 116 99  --   BILITOT 0.8 0.5  --   PROT 6.9 6.2*  --   ALBUMIN 2.9* 2.6* 2.5*   Recent Labs  Lab 07/29/22 1438  LIPASE 26   Recent Labs  Lab 07/29/22 1820  AMMONIA 20   Coagulation Profile: No results for input(s): "INR", "PROTIME" in the last 168 hours. Cardiac Enzymes: No results for input(s): "CKTOTAL", "CKMB", "CKMBINDEX", "TROPONINI" in the last 168 hours. BNP (last 3 results) No results for input(s): "PROBNP" in the last 8760 hours. HbA1C: No results for input(s): "HGBA1C" in the last 72 hours. CBG: Recent Labs  Lab 07/31/22 2320 08/01/22 0127 08/01/22 0323 08/01/22 0521 08/01/22 0901  GLUCAP 101* 103* 97 104* 89   Lipid Profile: No results for input(s): "CHOL", "HDL", "LDLCALC", "TRIG", "CHOLHDL", "LDLDIRECT" in the last 72 hours. Thyroid Function Tests: Recent Labs    08/01/22 0219  TSH 6.685*   Anemia Panel: Recent Labs    07/30/22 0130 07/30/22 0351  VITAMINB12 724  --   FOLATE  --  9.4   Sepsis Labs: Recent Labs  Lab 07/30/22 0200  LATICACIDVEN 0.6    Recent Results (from the past 240 hour(s))  SARS Coronavirus 2 by RT PCR (hospital order, performed in [facility] hospital lab) *cepheid single result test* Anterior Nasal Swab     Status: None   Collection Time: 07/30/22  1:30 AM   Specimen: Anterior Nasal Swab  Result Value Ref Range Status   SARS Coronavirus 2 by RT PCR NEGATIVE NEGATIVE Final    Comment: Performed at [facility] Lab, [address], [place], [place] [address]  MRSA Next Gen by PCR, Nasal     Status: None   Collection Time: 07/30/22  8:35 AM   Specimen: Nasal Mucosa; Nasal Swab  Result Value Ref Range Status   MRSA by PCR Next Gen NOT DETECTED NOT DETECTED Final    Comment: (NOTE) The GeneXpert MRSA Assay (FDA approved for NASAL specimens only), is one component  of a comprehensive MRSA colonization surveillance program. It is not intended to diagnose MRSA infection nor to guide or monitor treatment for MRSA infections. Test performance is not FDA approved in patients less than 81 years old. Performed at [facility], [address], [place], [place] [address]   Culture, blood (Routine X 2) w Reflex to ID Panel     Status: None (Preliminary result)   Collection Time: 07/30/22  4:02 PM   Specimen: BLOOD  Result Value Ref Range Status   Specimen Description BLOOD  SITE NOT SPECIFIED  Final   Special Requests   Final    BOTTLES DRAWN AEROBIC AND ANAEROBIC Blood Culture adequate volume   Culture   Final    NO GROWTH 2 DAYS Performed at [facility] Lab, [address] [place], [place] [address]    Report Status PENDING  Incomplete  Culture, blood (Routine X 2) w Reflex to ID Panel     Status: None (Preliminary result)   Collection Time: 07/30/22  4:02 PM   Specimen: BLOOD  Result Value Ref Range Status   Specimen Description BLOOD SITE NOT SPECIFIED  Final   Special Requests   Final    BOTTLES DRAWN AEROBIC AND ANAEROBIC Blood Culture adequate volume   Culture   Final    NO GROWTH 2 DAYS Performed at [facility], [address] [place], [place] [address]    Report Status PENDING  Incomplete         Radiology Studies: CT HEAD WO CONTRAST ( )  Result Date: 08/01/2022 CLINICAL DATA:  Mental status change, unknown cause EXAM: CT HEAD WITHOUT CONTRAST TECHNIQUE: Contiguous axial images were obtained from the base of the skull through the vertex without intravenous contrast. RADIATION DOSE REDUCTION: This exam was performed according to the departmental dose-optimization program which includes automated exposure control, adjustment of the mA and/or kV according to patient size and/or use of iterative reconstruction technique. COMPARISON:  CT head 07/30/22, 12/15/21 FINDINGS: Brain: No evidence of acute infarction, hemorrhage,  hydrocephalus, extra-axial collection or mass lesion/mass effect. There is sequela of moderate chronic microvascular ischemic change. 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 Possible focal hypodense lesion in the left hemi pons (series 3, image 12-10). Vascular: No hyperdense vessel or unexpected calcification. There are dense calcifications in the V4 segments of bilateral vertebral arteries. Skull: Normal. Negative for fracture or focal lesion. Sinuses/Orbits: No middle ear or mastoid effusion. Paranasal sinuses are clear. Bilateral lens replacement. Orbits are otherwise unremarkable. Other: None. IMPRESSION: Possible focal hypodense lesion in the left hemi pons, which could represent an age-indeterminate infarct. Consider further evaluation with brain MRI if this is a clinical concern. These results will be called to the ordering clinician or representative by the Radiologist Assistant, and communication documented in the PACS or [employer]. Electronically Signed   By: Lorenza Cambridge M.D.   On: 08/01/2022 09:13   DG Hand 2 View Right  Result Date: 07/31/2022 CLINICAL DATA:  Right hand pain. EXAM: RIGHT HAND - 2 VIEW COMPARISON:  Right hand radiographs 07/15/2022 FINDINGS: Interval amputation of the fourth finger to the distal shaft of the proximal phalanx. Interval amputation of the index finger to the proximal shaft of the middle phalanx. The resection margins are within normal limits given the recent surgery since 07/15/2022. Note is made that the soft tissues of the fourth digit were atrophic distally. High-grade extensive vascular calcifications are again seen. 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 IMPRESSION: 1. Interval  amputation of the fourth finger to the distal shaft of the proximal phalanx. 2. Interval amputation of the index finger to the proximal shaft of the middle phalanx. 3. High-grade extensive vascular calcifications. Electronically Signed   By: Neita Garnet M.D.   On: 07/31/2022 10:07   Overnight EEG with video  Result Date:  07/31/2022 Charlsie Quest, MD     08/01/2022  9:29 AM Patient Name: Chase [name] MRN: [id number] Epilepsy Attending: Charlsie Quest Referring Physician/Provider: [name], MD  Duration: 07/30/2022 1337 to 07/31/2022 2100 Patient history:  72 y.o. male with history of traumatic head injury presented to the ED from dialysis complaining of abdominal pain for 1 day 7/23.  On admission patient was alert and oriented to self only not at baseline per wife.  7/24 patient had a witnessed seizure described as right-sided weakness, right facial droop. EEG to evaluate for seizure Level of alertness:  lethargic AEDs during EEG study: LEV Technical aspects: This EEG study was done with scalp electrodes positioned according to the 10-20 International system of electrode placement. Electrical activity was reviewed with band pass filter of 1-70Hz , sensitivity of 7 uV/mm, display speed of 39mm/sec with a 60Hz  notched filter applied as appropriate. EEG data were recorded continuously and digitally stored.  Video monitoring was available and reviewed as appropriate. Description: EEG showed continuous generalized polymorphic sharply contoured 3 to 6 Hz theta-delta slowing, at times with triphasic morphology. Hyperventilation and photic stimulation were not performed.  EEG was disconnected between 07/30/2022 at 1637 to 1844  and 07/31/2022 1433 to 1928 due to technical reasons. ABNORMALITY - Continuous slow, generalized IMPRESSION: This study is suggestive of moderate to severe diffuse encephalopathy, nonspecific etiology but likely related to toxic-metabolic causes. No seizures or definite  epileptiform discharges were seen throughout the recording. Priyanka Annabelle Harman   CT HEAD WO CONTRAST ( )  Result Date: 07/30/2022 CLINICAL DATA:  Neuro deficit, acute, stroke suspected. " Right-sided weakness, not localizing to that side but is to the left side." EXAM: CT HEAD WITHOUT CONTRAST TECHNIQUE: Contiguous axial images were obtained from the base of the skull through the vertex without intravenous contrast. RADIATION DOSE REDUCTION: This exam was performed according to the departmental dose-optimization program which includes automated exposure control, adjustment of the mA and/or kV according to patient size and/or use of iterative reconstruction technique. COMPARISON:  07/29/2022 FINDINGS: Brain: Advanced brain atrophy. No focal abnormality seen affecting the brainstem. No focal cerebellar finding. Cerebral hemispheres show chronic small-vessel ischemic changes of the white matter. No sign of acute infarction. No mass, hemorrhage, hydrocephalus or extra-axial collection. Vascular: There is atherosclerotic calcification of the major vessels at the base of the brain. Skull: Negative Sinuses/Orbits: Clear/normal Other: None IMPRESSION: No acute CT finding. Advanced brain atrophy. Chronic small-vessel ischemic changes of the cerebral hemispheric white matter. The exam suffers from some motion degradation and streak artifact from what are probably EEG monitoring devices. Electronically Signed   By: Paulina Fusi M.D.   On: 07/30/2022 17:28        Scheduled Meds:  aspirin EC  81 mg Oral Daily   bacitracin   Topical TID   calcitRIOL  0.25 mcg Oral Daily   calcium acetate  667 mg Oral TID WC   Chlorhexidine Gluconate Cloth  6 each Topical Q0600   clopidogrel  75 mg Oral Daily   darbepoetin (ARANESP) injection - DIALYSIS  60 mcg Subcutaneous Q Thu-1800   heparin injection (subcutaneous)  5,000 Units Subcutaneous Q8H   levothyroxine  250 mcg Oral Q0600   midodrine  10 mg Oral Q T,Th,Sa-HD    pantoprazole  40 mg Oral BID   QUEtiapine  25 mg Per Tube QHS   rosuvastatin  10 mg Oral QHS   sodium chloride flush  3 mL Intravenous Q12H   Continuous Infusions:  sodium  chloride     dextrose 50 mL/hr at 08/01/22 0606   valproate sodium 500 mg (08/01/22 0943)     LOS: 2 days     Huey Bienenstock, MD [facility] Available via Epic secure chat 7am-7pm After these hours, please refer to coverage provider listed on [email or web address] 08/01/2022, 11:28 AM

## 2022-08-01 NOTE — Plan of Care (Signed)

## 2022-08-01 NOTE — Procedures (Signed)
Cortrak  Person Inserting Tube:  Greig Castilla D, RD Tube Type:  Cortrak - 43 inches Tube Size:  10 Tube Location:  Right nare Secured by: Bridle Technique Used to Measure Tube Placement:  Marking at nare/corner of mouth Cortrak Secured At:  68 cm Procedure Comments:  Cortrak Tube Team Note:  Consult received to place a Cortrak feeding tube.   X-ray is required, abdominal x-ray has been ordered by the Cortrak team. Please confirm tube placement before using the Cortrak tube.   If the tube becomes dislodged please keep the tube and contact the Cortrak team at [email or web address] for replacement.  If after hours and replacement cannot be delayed, place a NG tube and confirm placement with an abdominal x-ray.    Greig Castilla, RD, LDN Clinical Dietitian RD pager # available in AMION  After hours/weekend pager # available in [facility]

## 2022-08-01 NOTE — Procedures (Signed)
Patient Name: Chase [name]  MRN: [id number]  Epilepsy Attending: Charlsie Quest  Referring Physician/Provider: [name], MD   Duration: 07/31/2022 2100 to 08/01/2022 0827   Patient history:  72 y.o. male with history of traumatic head injury presented to the ED from dialysis complaining of abdominal pain for 1 day 7/23.  On admission patient was alert and oriented to self only not at baseline per wife.  7/24 patient had a witnessed seizure described as right-sided weakness, right facial droop. EEG to evaluate for seizure   Level of alertness:  lethargic    AEDs during EEG study: LEV   Technical aspects: This EEG study was done with scalp electrodes positioned according to the 10-20 International system of electrode placement. Electrical activity was reviewed with band pass filter of 1-70Hz , sensitivity of 7 uV/mm, display speed of 38mm/sec with a 60Hz  notched filter applied as appropriate. EEG data were recorded continuously and digitally stored.  Video monitoring was available and reviewed as appropriate.   Description: EEG showed continuous generalized polymorphic sharply contoured 3 to 6 Hz theta-delta slowing, at times with triphasic morphology. Hyperventilation and photic stimulation were not performed.     Parts of the study difficult interpret due to significant movement and electrode artifact.   ABNORMALITY - Continuous slow, generalized   IMPRESSION: This study is suggestive of moderate to severe diffuse encephalopathy, nonspecific etiology but likely related to toxic-metabolic causes. No seizures or definite epileptiform discharges were seen throughout the recording.   Tionne Dayhoff Annabelle Harman

## 2022-08-02 ENCOUNTER — Inpatient Hospital Stay (HOSPITAL_COMMUNITY): Payer: Medicare Other

## 2022-08-02 DIAGNOSIS — I96 Gangrene, not elsewhere classified: Secondary | ICD-10-CM | POA: Diagnosis not present

## 2022-08-02 DIAGNOSIS — E1169 Type 2 diabetes mellitus with other specified complication: Secondary | ICD-10-CM | POA: Diagnosis not present

## 2022-08-02 DIAGNOSIS — G9340 Encephalopathy, unspecified: Secondary | ICD-10-CM | POA: Diagnosis not present

## 2022-08-02 DIAGNOSIS — N186 End stage renal disease: Secondary | ICD-10-CM | POA: Diagnosis not present

## 2022-08-02 LAB — GLUCOSE, CAPILLARY
Glucose-Capillary: 148 mg/dL — ABNORMAL HIGH (ref 70–99)
Glucose-Capillary: 156 mg/dL — ABNORMAL HIGH (ref 70–99)
Glucose-Capillary: 160 mg/dL — ABNORMAL HIGH (ref 70–99)
Glucose-Capillary: 168 mg/dL — ABNORMAL HIGH (ref 70–99)
Glucose-Capillary: 171 mg/dL — ABNORMAL HIGH (ref 70–99)
Glucose-Capillary: 80 mg/dL (ref 70–99)

## 2022-08-02 NOTE — Progress Notes (Signed)
[facility] Progress Note   Subjective:  Seen in room - resting and not responding to my questions. Wife reports intermittently will answer a question correctly or follow command, but still very confused.   Objective Vitals:   08/01/22 2344 08/02/22 0345 08/02/22 0400 08/02/22 0800  BP: (!) 111/44  (!) 114/53 (!) 113/96  Pulse: 67  71 87  Resp: 15  17 (!) 21  Temp: (!) 97.4 F (36.3 C)  97.9 F (36.6 C) 99.2 F (37.3 C)  TempSrc: Axillary  Axillary Axillary  SpO2: 100%  100% 97%  Weight:  86.4 kg    Height:       Physical Exam General: Calm, resting, not speaking to me this AM. Heart:RRR; no murmur Lungs: CTAB; no rales Abdomen: soft, non-tender Extremities: no LE edema. 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 Dialysis Access: L forearm AVF + bruit  Additional Objective Labs: Basic Metabolic Panel: Recent Labs  Lab 07/31/22 0925 08/01/22 0219 08/01/22 1627 08/02/22 0606  NA 133* 132*  --  128*  K 4.8 3.8  --  3.9  CL 98 95*  --  93*  CO2 22 25  --  24  GLUCOSE 146* 90  --  120*  BUN 54* 21  --  29*  CREATININE 6.57* 3.56*  --  4.49*  CALCIUM 8.7* 8.4*  --  8.2*  PHOS 6.5* 3.7 4.3 4.5   Liver Function Tests: Recent Labs  Lab 07/29/22 1438 07/30/22 0814 07/31/22 0925 08/02/22 0606  AST 13* 10*  --   --   ALT 7 7  --   --   ALKPHOS 116 99  --   --   BILITOT 0.8 0.5  --   --   PROT 6.9 6.2*  --   --   ALBUMIN 2.9* 2.6* 2.5* 2.1*   Recent Labs  Lab 07/29/22 1438  LIPASE 26   CBC: Recent Labs  Lab 07/29/22 1438 07/30/22 0244 07/30/22 0814 07/31/22 0925 08/01/22 0219 08/02/22 0605  WBC 7.4  --  6.3 13.3* 7.0 6.6  NEUTROABS 5.6  --   --   --  5.3  --   HGB 10.7*   < > 9.7* 9.4* 9.7* 9.3*  HCT 35.2*   < > 32.1* 30.6* 30.8* 30.2*  MCV 98.6  --  95.3 97.1 94.2 94.4  PLT 183  --  154 148* 133* 130*   < > = values in this interval not displayed.   Blood Culture    Component Value Date/Time   SDES BLOOD SITE NOT  SPECIFIED 07/30/2022 1602   SDES BLOOD SITE NOT SPECIFIED 07/30/2022 1602   SPECREQUEST  07/30/2022 1602    BOTTLES DRAWN AEROBIC AND ANAEROBIC Blood Culture adequate volume   SPECREQUEST  07/30/2022 1602    BOTTLES DRAWN AEROBIC AND ANAEROBIC Blood Culture adequate volume   CULT  07/30/2022 1602    NO GROWTH 3 DAYS Performed at [facility], [address] [place], [place] [address]    CULT  07/30/2022 1602    NO GROWTH 3 DAYS Performed at [facility], [address], [place], [place] [address]    REPTSTATUS PENDING 07/30/2022 1602   REPTSTATUS PENDING 07/30/2022 1602   Studies/Results: ECHOCARDIOGRAM COMPLETE  Result Date: 08/01/2022    ECHOCARDIOGRAM REPORT   Patient Name:   Chase [name] Date of Exam: 08/01/2022 Medical Rec #:  [id number]  Height:       66.0 in Accession #:    [id number]            Weight:       190.0 lb Date of Birth:  16-Oct-1950             BSA:          1.957 m Patient Age:    72 years              BP:           125/79 mmHg Patient Gender: M                     HR:           75 bpm. Exam Location:  Inpatient Procedure: 2D Echo, Cardiac Doppler and Color Doppler Indications:    Atrial Fibrillation I48.91  History:        Patient has no prior history of Echocardiogram examinations.                 CAD; Risk Factors:Hypertension and Diabetes. ESRD.  Sonographer:    Lucendia Herrlich Referring Phys: [phone] DEBBY CROSLEY  Sonographer Comments: Image acquisition challenging due to patient behavioral factors. IMPRESSIONS  1. Left ventricular ejection fraction, by estimation, is 60 to 65%. The left ventricle has normal function. The left ventricle has no regional wall motion abnormalities. There is moderate concentric left ventricular hypertrophy. Left ventricular diastolic parameters are indeterminate.  2. Right ventricular systolic function is normal. The right ventricular size is normal.  3. Trivial mitral valve regurgitation.  4. Aortic valve  regurgitation is not visualized. Aortic valve sclerosis/calcification is present, without any evidence of aortic stenosis. FINDINGS  Left Ventricle: Left ventricular ejection fraction, by estimation, is 60 to 65%. The left ventricle has normal function. The left ventricle has no regional wall motion abnormalities. The left ventricular internal cavity size was normal in size. There is  moderate concentric left ventricular hypertrophy. Left ventricular diastolic parameters are indeterminate. Right Ventricle: The right ventricular size is normal. Right vetricular wall thickness was not assessed. Right ventricular systolic function is normal. Left Atrium: Left atrial size was normal in size. Right Atrium: Right atrial size was normal in size. Pericardium: There is no evidence of pericardial effusion. Mitral Valve: There is mild thickening of the mitral valve leaflet(s). Mild to moderate mitral annular calcification. Trivial mitral valve regurgitation. Tricuspid Valve: The tricuspid valve is normal in structure. Tricuspid valve regurgitation is trivial. Aortic Valve: Aortic valve regurgitation is not visualized. Aortic valve sclerosis/calcification is present, without any evidence of aortic stenosis. Aortic valve peak gradient measures 15.4 mmHg. Pulmonic Valve: The pulmonic valve was normal in structure. Pulmonic valve regurgitation is not visualized. Aorta: The aortic root and ascending aorta are structurally normal, with no evidence of dilitation. IAS/Shunts: No atrial level shunt detected by color flow Doppler.  LEFT VENTRICLE PLAX 2D LVIDd:         4.90 cm   Diastology LVIDs:         3.50 cm   LV e' medial:    10.10 cm/s LV PW:         1.50 cm   LV E/e' medial:  14.8 LV IVS:        1.60 cm   LV e' lateral:   8.39 cm/s LVOT diam:     2.20 cm   LV E/e' lateral: 17.8 LV SV:         90  LV SV Index:   46 LVOT Area:     3.80 cm  RIGHT VENTRICLE         IVC TAPSE (M-mode): 1.8 cm  IVC diam: 2.70 cm LEFT ATRIUM            Index        RIGHT ATRIUM           Index LA diam:      4.80 cm 2.45 cm/m   RA Area:     15.40 cm LA Vol (A2C): 61.8 ml 31.58 ml/m  RA Volume:   28.80 ml  14.72 ml/m LA Vol (A4C): 59.3 ml 30.30 ml/m  AORTIC VALVE AV Area (Vmax): 2.36 cm AV Vmax:        196.00 cm/s AV Peak Grad:   15.4 mmHg LVOT Vmax:      121.67 cm/s LVOT Vmean:     83.367 cm/s LVOT VTI:       0.237 m  AORTA Ao Root diam: 3.40 cm Ao Asc diam:  3.60 cm MITRAL VALVE                TRICUSPID VALVE MV Area (PHT): 7.99 cm     TR Peak grad:   26.2 mmHg MV Decel Time: 95 msec      TR Vmax:        256.00 cm/s MV E velocity: 149.00 cm/s                             SHUNTS                             Systemic VTI:  0.24 m                             Systemic Diam: 2.20 cm Dietrich Pates MD Electronically signed by Dietrich Pates MD Signature Date/Time: 08/01/2022/3:04:04 PM    Final    DG Abd Portable 1V  Result Date: 08/01/2022 CLINICAL DATA:  NG tube placement. EXAM: PORTABLE ABDOMEN - 1 VIEW COMPARISON:  KUB 07/28/2019, CT abdomen/pelvis 07/29/2022 FINDINGS: The enteric catheter tip is in the region of the pylorus. There is a nonobstructive bowel gas pattern in the imaged abdomen. A spinal stimulator is noted. IMPRESSION: Enteric catheter tip in the region of the pylorus. Electronically Signed   By: Lesia Hausen M.D.   On: 08/01/2022 13:01   CT HEAD WO CONTRAST ( )  Result Date: 08/01/2022 CLINICAL DATA:  Mental status change, unknown cause EXAM: CT HEAD WITHOUT CONTRAST TECHNIQUE: Contiguous axial images were obtained from the base of the skull through the vertex without intravenous contrast. RADIATION DOSE REDUCTION: This exam was performed according to the departmental dose-optimization program which includes automated exposure control, adjustment of the mA and/or kV according to patient size and/or use of iterative reconstruction technique. COMPARISON:  CT head 07/30/22, 12/15/21 FINDINGS: Brain: No evidence of acute infarction, hemorrhage,  hydrocephalus, extra-axial collection or mass lesion/mass effect. There is sequela of moderate chronic microvascular ischemic change. 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 Possible focal hypodense lesion in the left hemi pons (series 3, image 12-10). Vascular: No hyperdense vessel or unexpected calcification. There are dense calcifications in the V4 segments of bilateral vertebral arteries. Skull: Normal. Negative for fracture or focal lesion. Sinuses/Orbits: No middle ear or  mastoid effusion. Paranasal sinuses are clear. Bilateral lens replacement. Orbits are otherwise unremarkable. Other: None. IMPRESSION: Possible focal hypodense lesion in the left hemi pons, which could represent an age-indeterminate infarct. Consider further evaluation with brain MRI if this is a clinical concern. These results will be called to the ordering clinician or representative by the Radiologist Assistant, and communication documented in the PACS or [employer]. Electronically Signed   By: Lorenza Cambridge M.D.   On: 08/01/2022 09:13    Medications:  sodium chloride     dextrose 50 mL/hr at 08/01/22 2339   feeding supplement (OSMOLITE 1.5 CAL) 60 mL/hr at 08/02/22 0830   valproate sodium 500 mg (08/02/22 0946)    aspirin EC  81 mg Oral Daily   bacitracin   Topical TID   calcitRIOL  0.25 mcg Oral Daily   calcium acetate  667 mg Oral TID WC   Chlorhexidine Gluconate Cloth  6 each Topical Q0600   clopidogrel  75 mg Oral Daily   darbepoetin (ARANESP) injection - DIALYSIS  60 mcg Subcutaneous Q Thu-1800   feeding supplement (PROSource TF20)  60 mL Per Tube Daily   heparin injection (subcutaneous)  5,000 Units Subcutaneous Q8H   levothyroxine  250 mcg Oral Q0600   midodrine  10 mg Oral Q T,Th,Sa-HD   pantoprazole  40 mg Oral BID   QUEtiapine  25 mg Oral QHS   rosuvastatin  10 mg Oral QHS   sodium chloride flush  3 mL Intravenous Q12H    Dialysis Orders: TTS at  [facility] 3.5hr, 350/700, EDW 99kg, 2K, AVF, heparin 3700 bolus + 500U/hr pump - HD stopped 7/23 due to acute agitation, had been well prior to HD on 7/23 - Epogen 7600 units q HD   Assessment/Plan: 1. AMS: Acutely started during dialysis on 7/23 - no clear etiology. No new meds. Blood Cx 7/24 negative. Does have baseline memory loss and occ confusion per wife, but this is much worse. Initial head CT negative except chronic brain atrophy, repeat 7/26 showed possible lesion in pons - unable to have MRI d/t spinal stimulator. Had seizure-like activity on 7/24 - started on Keppra. EEG c/w diffuse metabolic encephalopathy. Has chronic back pain with spinal stimulator and on chronic narcotics - on hold. NG tube inserted, on mittens as he was trying to remove. Neuro following. 2. ESRD: on HD TTS - has not missed HD, BUN low - this is not uremia. Symptoms not improved with HD. Next HD today. 3. HTN/volume: BP ok, not eating - now with NG tube and getting TF. 4. Anemia: Hgb 9.3 - continue Aranesp weekly while here. 5. Secondary hyperparathyroidism:  Ca/Phos ok - continue home meds for now. 6. Nutrition: Alb low - not eating - now has NG tube. 7. 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 Abd pain: On admit - CT abdomen 7/23 without acute infection.    Ozzie Hoyle, PA-C 08/02/2022, 10:56 AM  [employer]

## 2022-08-02 NOTE — Progress Notes (Signed)
Patient remains drowsy and fatigue throughout the night. He only responds to voice at times to pain but doesn't open his eyes. He responds to questions in a clear voice but inappropriate. Still on O2 at 2lpm [place] and vital signs stable throughout the shift.

## 2022-08-02 NOTE — Progress Notes (Signed)
   08/02/22 1711  Vitals  Temp 98.1 F (36.7 C)  Pulse Rate 77  Resp (!) 22  BP (!) 128/42  SpO2 99 %  O2 Device Nasal Cannula  Weight 86 kg  Type of Weight Post-Dialysis  Oxygen Therapy  O2 Flow Rate (L/min) 2 L/min  Patient Activity (if Appropriate) In bed  Pulse Oximetry Type Continuous  Oximetry Probe Site Changed No  Post Treatment  Dialyzer Clearance Lightly streaked  Duration of HD Treatment -hour(s) 3 hour(s)  Hemodialysis Intake (mL) 0 mL  Liters Processed 72  Fluid Removed (mL) 1000 mL  Tolerated HD Treatment Yes  AVG/AVF Arterial Site Held (minutes) 10 minutes  AVG/AVF Venous Site Held (minutes) 10 minutes   Received patient in bed to unit.  Alert and oriented.  Informed consent signed and in chart.   TX duration:3.0  Patient tolerated well.  Transported back to the room  Alert, without acute distress.  Hand-off given to patient's nurse.   Access used: LUAF Access issues: no complications  Total UF removed: 1000 Medication(s) given: none   [name]

## 2022-08-02 NOTE — Progress Notes (Signed)
Notified floor RN Hessie Diener pt tube feeding was beeping complete, there is a little left in the bottle.

## 2022-08-02 NOTE — Progress Notes (Signed)
PROGRESS NOTE    [address]  ZJI:[id number] DOB: 1950/07/16 DOA: 07/29/2022 PCP: [name], MD    Brief Narrative:   72 y.o. male with medical history significant of  ESRD on HD TTS, CAD, hypothyroidism, hypertension, anemia of chronic disease, depression/anxiety, GERD, type 2 diabetes, chronic back and neck pain.  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 Admitted for acute encephalopathy.  Had seizure-like activity on 7/24.    Assessment and Plan:  ESRD (end stage renal disease) (HCC) -nephrology consulted, on HD TTS schedule, BUN is appropriate, no concern for uremia, next HD is today.   Suspected seizure -neuro consult, remains on LTM, AED per renal.  Acute metabolic encephalopathy - most likely multifactorial secondary to  polypharmacy and underlining dementia vs infection vs seizure as well likely hospital delirium is contributing. - unable to have MRI of the brain  -Hold off on oxycodone -Ammonia within normal limits -Possible hospital delirium as well, mentation improved today after started cold overnight, he remains on as needed haloperidol as well -will insert cortrack inserted 7/26 for tube feed. -CT head with no acute finding on admission, but repeat 7/26 with possible lesion in bones, will need repeat CT head today or tomorrow, timing per neurology.  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 Yehuda Budd during recent admission -Will order bacitracin ointment   PAD continue  Plavix   DVT prophylaxis: heparin injection 5,000 Units Start: 07/31/22 1400 SCDs Start: 07/30/22 0746    Code Status:  Full Code Family Communication: Cussed with wife at bedside 7/26, 7/27.  Disposition Plan:  Level of care: Progressive Status is: Observation The patient will require care spanning > 2 midnights and should be moved to inpatient     Consultants:  Renal neuro   Subjective:  Better night sleep overnight as discussed with wife at bedside, no significant events overnight as discussed with staff  Objective: Vitals:   08/01/22 2344 08/02/22 0345 08/02/22 0400 08/02/22 0800  BP: (!) 111/44  (!) 114/53 (!) 113/96  Pulse: 67  71 87  Resp: 15  17 (!) 21  Temp: (!) 97.4 F (36.3 C)  97.9 F (36.6 C) 99.2 F (37.3 C)  TempSrc: Axillary  Axillary Axillary  SpO2: 100%  100% 97%  Weight:  86.4 kg    Height:        Intake/Output Summary (Last 24 hours) at 08/02/2022 1141 Last data filed at 08/02/2022 0600 Gross per 24 hour  Intake 1824.36 ml  Output 700 ml  Net 1124.36 ml   Filed Weights   07/31/22 1500 07/31/22 1859 08/02/22 0345  Weight: 86.2 kg 86.2 kg 86.4 kg    Examination:  Symptoms more awake today, remains significantly confused, but more interactive Symmetrical Chest wall movement, Good air movement bilaterally, CTAB RRR,No Gallops,Rubs or new Murmurs, No Parasternal Heave +ve B.Sounds, Abd Soft, No tenderness, No rebound - guarding or rigidity. At hand second and fourth digits status post amputation    Data Reviewed: I have personally reviewed following labs and imaging studies  CBC: Recent Labs  Lab 07/29/22 1438 07/30/22 0244 07/30/22 0814 07/31/22 0925 08/01/22 0219 08/02/22 0605  WBC 7.4  --  6.3 13.3* 7.0 6.6  NEUTROABS 5.6  --   --   --  5.3  --   HGB 10.7* 10.9* 9.7* 9.4* 9.7* 9.3*  HCT 35.2* 32.0* 32.1* 30.6* 30.8* 30.2*  MCV 98.6  --  95.3 97.1 94.2 94.4  PLT 183  --  154 148* 133* 130*   Basic Metabolic Panel: Recent Labs  Lab 07/29/22 1438 07/30/22 0244 07/30/22 0814 07/31/22 0925 08/01/22 0219 08/01/22 1627 08/02/22 0605  08/02/22 0606  NA 136 136 134* 133* 132*  --   --  128*  K 4.6 4.7 4.8 4.8 3.8  --   --  3.9  CL 98  --  98 98 95*  --   --  93*  CO2 24  --  22 22 25   --   --  24  GLUCOSE 87  --  84 146* 90  --   --  120*  BUN 40*  --  46* 54* 21  --   --  29*  CREATININE 5.01*  --  5.85* 6.57* 3.56*  --   --  4.49*  CALCIUM 9.4  --  8.9 8.7* 8.4*  --   --  8.2*  MG  --   --  1.4*  --  1.4* 1.8 1.7  --   PHOS  --   --  5.2* 6.5* 3.7 4.3  --  4.5   GFR: Estimated Creatinine Clearance: 15.8 mL/min (A) (by C-G formula based on SCr of 4.49 mg/dL (H)). Liver Function Tests: Recent Labs  Lab 07/29/22 1438 07/30/22 0814 07/31/22 0925 08/02/22 0606  AST 13* 10*  --   --   ALT 7 7  --   --   ALKPHOS 116 99  --   --   BILITOT 0.8 0.5  --   --   PROT 6.9 6.2*  --   --   ALBUMIN 2.9* 2.6* 2.5* 2.1*   Recent Labs  Lab 07/29/22 1438  LIPASE 26   Recent Labs  Lab 07/29/22 1820  AMMONIA 20   Coagulation Profile: No results for input(s): "INR", "PROTIME" in the last 168 hours. Cardiac Enzymes: No results for input(s): "CKTOTAL", "CKMB", "CKMBINDEX", "TROPONINI" in the last 168 hours. BNP (last 3 results) No results for input(s): "PROBNP" in the last 8760 hours. HbA1C: No results for input(s): "HGBA1C" in the last 72 hours. CBG: Recent Labs  Lab 08/01/22 1956 08/01/22 2324 08/02/22 0338 08/02/22 0952 08/02/22 1129  GLUCAP 88 92 80 171* 168*   Lipid Profile: No results for input(s): "CHOL", "HDL", "LDLCALC", "TRIG", "CHOLHDL", "LDLDIRECT" in the last 72 hours. Thyroid Function Tests: Recent Labs    08/01/22 0219  TSH 6.685*   Anemia Panel: No results for input(s): "VITAMINB12", "FOLATE", "FERRITIN", "TIBC", "IRON", "RETICCTPCT" in the last 72 hours.  Sepsis Labs: Recent Labs  Lab 07/30/22 0200  LATICACIDVEN 0.6    Recent Results (from the past 240 hour(s))  SARS Coronavirus 2 by RT PCR (hospital order, performed in [facility] hospital lab) *cepheid single result test*  Anterior Nasal Swab     Status: None   Collection Time: 07/30/22  1:30 AM   Specimen: Anterior Nasal Swab  Result Value Ref Range Status   SARS Coronavirus 2 by RT PCR NEGATIVE NEGATIVE Final    Comment: Performed at [facility], [address] [place], [place] [address]  MRSA Next Gen by PCR, Nasal     Status: None   Collection Time: 07/30/22  8:35 AM  Specimen: Nasal Mucosa; Nasal Swab  Result Value Ref Range Status   MRSA by PCR Next Gen NOT DETECTED NOT DETECTED Final    Comment: (NOTE) The GeneXpert MRSA Assay (FDA approved for NASAL specimens only), is one component of a comprehensive MRSA colonization surveillance program. It is not intended to diagnose MRSA infection nor to guide or monitor treatment for MRSA infections. Test performance is not FDA approved in patients less than 72 years old. Performed at [facility], [address], [place], [place] [address]   Culture, blood (Routine X 2) w Reflex to ID Panel     Status: None (Preliminary result)   Collection Time: 07/30/22  4:02 PM   Specimen: BLOOD  Result Value Ref Range Status   Specimen Description BLOOD SITE NOT SPECIFIED  Final   Special Requests   Final    BOTTLES DRAWN AEROBIC AND ANAEROBIC Blood Culture adequate volume   Culture   Final    NO GROWTH 3 DAYS Performed at [facility], [address], [place], [place] [address]    Report Status PENDING  Incomplete  Culture, blood (Routine X 2) w Reflex to ID Panel     Status: None (Preliminary result)   Collection Time: 07/30/22  4:02 PM   Specimen: BLOOD  Result Value Ref Range Status   Specimen Description BLOOD SITE NOT SPECIFIED  Final   Special Requests   Final    BOTTLES DRAWN AEROBIC AND ANAEROBIC Blood Culture adequate volume   Culture   Final    NO GROWTH 3 DAYS Performed at [facility], [address], [place], [place] [address]    Report Status PENDING  Incomplete         Radiology Studies: ECHOCARDIOGRAM  COMPLETE  Result Date: 08/01/2022    ECHOCARDIOGRAM REPORT   Patient Name:   ALIM MCFARLAND Date of Exam: 08/01/2022 Medical Rec #:  [id number]             Height:       66.0 in Accession #:    [id number]            Weight:       190.0 lb Date of Birth:  [birth year]             BSA:          1.957 m Patient Age:    71 years              BP:           125/79 mmHg Patient Gender: M                     HR:           75 bpm. Exam Location:  Inpatient Procedure: 2D Echo, Cardiac Doppler and Color Doppler Indications:    Atrial Fibrillation I48.91  History:        Patient has no prior history of Echocardiogram examinations.                 CAD; Risk Factors:Hypertension and Diabetes. ESRD.  Sonographer:    Lucendia Herrlich Referring Phys: [id number] DEBBY CROSLEY  Sonographer Comments: Image acquisition challenging due to patient behavioral factors. IMPRESSIONS  1. Left ventricular ejection fraction, by estimation, is 60 to 65%. The left ventricle has normal function. The left ventricle has no regional wall motion abnormalities. There is moderate concentric left ventricular hypertrophy. Left ventricular diastolic parameters are indeterminate.  2. Right ventricular systolic function is normal. The right ventricular size is normal.  3. Trivial mitral valve regurgitation.  4. Aortic valve regurgitation is not visualized. Aortic valve sclerosis/calcification is present, without any evidence of aortic stenosis. FINDINGS  Left Ventricle: Left ventricular ejection fraction, by estimation, is 60 to 65%. The left ventricle has normal function. The left ventricle has no regional wall motion abnormalities. The left ventricular internal cavity size was normal in size. There is  moderate concentric left ventricular hypertrophy. Left ventricular diastolic parameters are indeterminate. Right Ventricle: The right ventricular size is normal. Right vetricular wall thickness was not assessed. Right ventricular systolic function is normal.  Left Atrium: Left atrial size was normal in size. Right Atrium: Right atrial size was normal in size. Pericardium: There is no evidence of pericardial effusion. Mitral Valve: There is mild thickening of the mitral valve leaflet(s). Mild to moderate mitral annular calcification. Trivial mitral valve regurgitation. Tricuspid Valve: The tricuspid valve is normal in structure. Tricuspid valve regurgitation is trivial. Aortic Valve: Aortic valve regurgitation is not visualized. Aortic valve sclerosis/calcification is present, without any evidence of aortic stenosis. Aortic valve peak gradient measures 15.4 mmHg. Pulmonic Valve: The pulmonic valve was normal in structure. Pulmonic valve regurgitation is not visualized. Aorta: The aortic root and ascending aorta are structurally normal, with no evidence of dilitation. IAS/Shunts: No atrial level shunt detected by color flow Doppler.  LEFT VENTRICLE PLAX 2D LVIDd:         4.90 cm   Diastology LVIDs:         3.50 cm   LV e' medial:    10.10 cm/s LV PW:         1.50 cm   LV E/e' medial:  14.8 LV IVS:        1.60 cm   LV e' lateral:   8.39 cm/s LVOT diam:     2.20 cm   LV E/e' lateral: 17.8 LV SV:         90 LV SV Index:   46 LVOT Area:     3.80 cm  RIGHT VENTRICLE         IVC TAPSE (M-mode): 1.8 cm  IVC diam: 2.70 cm LEFT ATRIUM           Index        RIGHT ATRIUM           Index LA diam:      4.80 cm 2.45 cm/m   RA Area:     15.40 cm LA Vol (A2C): 61.8 ml 31.58 ml/m  RA Volume:   28.80 ml  14.72 ml/m LA Vol (A4C): 59.3 ml 30.30 ml/m  AORTIC VALVE AV Area (Vmax): 2.36 cm AV Vmax:        196.00 cm/s AV Peak Grad:   15.4 mmHg LVOT Vmax:      121.67 cm/s LVOT Vmean:     83.367 cm/s LVOT VTI:       0.237 m  AORTA Ao Root diam: 3.40 cm Ao Asc diam:  3.60 cm MITRAL VALVE                TRICUSPID VALVE MV Area (PHT): 7.99 cm     TR Peak grad:   26.2 mmHg MV Decel Time: 95 msec      TR Vmax:        256.00 cm/s MV E velocity: 149.00 cm/s  SHUNTS                              Systemic VTI:  0.24 m                             Systemic Diam: 2.20 cm Dietrich Pates MD Electronically signed by Dietrich Pates MD Signature Date/Time: 08/01/2022/3:04:04 PM    Final    DG Abd Portable 1V  Result Date: 08/01/2022 CLINICAL DATA:  NG tube placement. EXAM: PORTABLE ABDOMEN - 1 VIEW COMPARISON:  KUB 07/28/2019, CT abdomen/pelvis 07/29/2022 FINDINGS: The enteric catheter tip is in the region of the pylorus. There is a nonobstructive bowel gas pattern in the imaged abdomen. A spinal stimulator is noted. IMPRESSION: Enteric catheter tip in the region of the pylorus. Electronically Signed   By: Lesia Hausen M.D.   On: 08/01/2022 13:01   CT HEAD WO CONTRAST ( )  Result Date: 08/01/2022 CLINICAL DATA:  Mental status change, unknown cause EXAM: CT HEAD WITHOUT CONTRAST TECHNIQUE: Contiguous axial images were obtained from the base of the skull through the vertex without intravenous contrast. RADIATION DOSE REDUCTION: This exam was performed according to the departmental dose-optimization program which includes automated exposure control, adjustment of the mA and/or kV according to patient size and/or use of iterative reconstruction technique. COMPARISON:  CT head 07/30/22, 12/15/21 FINDINGS: Brain: No evidence of acute infarction, hemorrhage, hydrocephalus, extra-axial collection or mass lesion/mass effect. There is sequela of moderate chronic microvascular ischemic change. 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 Possible focal hypodense lesion in the left hemi pons (series 3, image 12-10). Vascular: No hyperdense vessel or unexpected calcification. There are dense calcifications in the V4 segments of bilateral vertebral arteries. Skull: Normal. Negative for fracture or focal lesion. Sinuses/Orbits: No middle ear or mastoid effusion. Paranasal sinuses are clear. Bilateral lens replacement. Orbits are otherwise unremarkable. Other:  None. IMPRESSION: Possible focal hypodense lesion in the left hemi pons, which could represent an age-indeterminate infarct. Consider further evaluation with brain MRI if this is a clinical concern. These results will be called to the ordering clinician or representative by the Radiologist Assistant, and communication documented in the PACS or [employer]. Electronically Signed   By: Lorenza Cambridge M.D.   On: 08/01/2022 09:13        Scheduled Meds:  aspirin EC  81 mg Oral Daily   bacitracin   Topical TID   calcitRIOL  0.25 mcg Oral Daily   calcium acetate  667 mg Oral TID WC   Chlorhexidine Gluconate Cloth  6 each Topical Q0600   clopidogrel  75 mg Oral Daily   darbepoetin (ARANESP) injection - DIALYSIS  60 mcg Subcutaneous Q Thu-1800   feeding supplement (PROSource TF20)  60 mL Per Tube Daily   heparin injection (subcutaneous)  5,000 Units Subcutaneous Q8H   levothyroxine  250 mcg Oral Q0600   midodrine  10 mg Oral Q T,Th,Sa-HD   pantoprazole  40 mg Oral BID   QUEtiapine  25 mg Oral QHS   rosuvastatin  10 mg Oral QHS   sodium chloride flush  3 mL Intravenous Q12H   Continuous Infusions:  sodium chloride     dextrose 50 mL/hr at 08/01/22 2339   feeding supplement (OSMOLITE 1.5 CAL) 60 mL/hr at 08/02/22 0830   valproate sodium 500 mg (08/02/22 0946)  LOS: 3 days     Huey Bienenstock, MD [facility] Available via Epic secure chat 7am-7pm After these hours, please refer to coverage provider listed on [email or web address] 08/02/2022, 11:41 AM

## 2022-08-03 DIAGNOSIS — I639 Cerebral infarction, unspecified: Secondary | ICD-10-CM

## 2022-08-03 DIAGNOSIS — G9340 Encephalopathy, unspecified: Secondary | ICD-10-CM | POA: Diagnosis not present

## 2022-08-03 LAB — CBC
HCT: 30.7 % — ABNORMAL LOW (ref 39.0–52.0)
Hemoglobin: 9.3 g/dL — ABNORMAL LOW (ref 13.0–17.0)
MCH: 29.2 pg (ref 26.0–34.0)
MCHC: 30.3 g/dL (ref 30.0–36.0)
MCV: 96.2 fL (ref 80.0–100.0)
Platelets: 138 10*3/uL — ABNORMAL LOW (ref 150–400)
RBC: 3.19 MIL/uL — ABNORMAL LOW (ref 4.22–5.81)
RDW: 14.6 % (ref 11.5–15.5)
WBC: 6.3 10*3/uL (ref 4.0–10.5)
nRBC: 0 % (ref 0.0–0.2)

## 2022-08-03 LAB — RENAL FUNCTION PANEL
Albumin: 2.3 g/dL — ABNORMAL LOW (ref 3.5–5.0)
Anion gap: 10 (ref 5–15)
BUN: 24 mg/dL — ABNORMAL HIGH (ref 8–23)
CO2: 29 mmol/L (ref 22–32)
Calcium: 8.5 mg/dL — ABNORMAL LOW (ref 8.9–10.3)
Chloride: 93 mmol/L — ABNORMAL LOW (ref 98–111)
Creatinine, Ser: 3.11 mg/dL — ABNORMAL HIGH (ref 0.61–1.24)
GFR, Estimated: 21 mL/min — ABNORMAL LOW (ref >60)
Glucose, Bld: 147 mg/dL — ABNORMAL HIGH (ref 70–99)
Phosphorus: 2.1 mg/dL — ABNORMAL LOW (ref 2.5–4.6)
Potassium: 4.1 mmol/L (ref 3.5–5.1)
Sodium: 132 mmol/L — ABNORMAL LOW (ref 135–145)

## 2022-08-03 LAB — GLUCOSE, CAPILLARY
Glucose-Capillary: 127 mg/dL — ABNORMAL HIGH (ref 70–99)
Glucose-Capillary: 137 mg/dL — ABNORMAL HIGH (ref 70–99)
Glucose-Capillary: 140 mg/dL — ABNORMAL HIGH (ref 70–99)
Glucose-Capillary: 145 mg/dL — ABNORMAL HIGH (ref 70–99)
Glucose-Capillary: 156 mg/dL — ABNORMAL HIGH (ref 70–99)

## 2022-08-03 LAB — MAGNESIUM: Magnesium: 1.5 mg/dL — ABNORMAL LOW (ref 1.7–2.4)

## 2022-08-03 MED ORDER — MAGNESIUM SULFATE IN D5W 1-5 GM/100ML-% IV SOLN
1.0000 g | Freq: Once | INTRAVENOUS | Status: AC
  Administered 2022-08-03: 1 g via INTRAVENOUS
  Filled 2022-08-03: qty 100

## 2022-08-03 NOTE — Progress Notes (Addendum)
PROGRESS NOTE    Chase [name]  WUJ:[id number] DOB: 1950-05-28 DOA: 07/29/2022 PCP: [name], MD    Brief Narrative:   72 y.o. male with medical history significant of  ESRD on HD TTS, CAD, hypothyroidism, hypertension, anemia of chronic disease, depression/anxiety, GERD, type 2 diabetes, chronic back and neck pain.  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 Admitted for acute encephalopathy.  Had seizure-like activity on 7/24.  Seen by neurology, LTM with no significant seizure activity, repeat CT head showing left pons infarct   Assessment and Plan:  ESRD (end stage renal disease) (HCC) -nephrology consulted, on HD TTS schedule, BUN is appropriate, no concern for uremia, next HD is today.   Suspected seizure -neuro consult, remains on LTM, AED per neurology.  Acute metabolic encephalopathy - most likely multifactorial secondary to  polypharmacy and underlining dementia vs infection vs seizure as well likely hospital delirium is contributing. -As well now with diagnosis of acute CVA, likely be contributing as well - unable to have MRI of the brain  -Hold off on oxycodone -Ammonia within normal limits -New with Seroquel -cortrack inserted 7/26 for tube feed.on Tube feed.  Acute CVA -Left pons infarct, not present on initial CT head, but has been confirmed by repeat CT head x 2 -Logen input greatly appreciated, continue with aspirin and Plavix then Plavix alone  Diabetes mellitus (HCC) with hypoglycemia Hypoglycemia -CBG low due to poor oral intake requiring D5W, now he is on tube feed this has just improved on it anymore on D5W.   Hypomagnesemia -repleted  Failure to thrive/protein calorie malnutrition Hypophosphatemia -Patient with poor oral intake for few days, with low albumin level at 2.3 as well, and for tube feed, phosphorous has dropped, concern for refeeding syndrome, will discuss with renal if appropriate to replete phosphorus and a  dialysis patient  Hypothyroidism Continue synthroid at home dos  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 Yehuda Budd during recent admission -on  bacitracin ointment  PAD continue  Plavix   DVT prophylaxis: heparin injection 5,000 Units Start: 07/31/22 1400 SCDs Start: 07/30/22 0746    Code Status: Full Code Family Communication: Cussed with wife at bedside 7/26, 7/27.  Disposition Plan:  Level of care: Progressive Status : inpatient The patient will require care spanning > 2 midnights and should be moved to inpatient     Consultants:  Renal neuro   Subjective:  Has been more appropriate as discussed with staff, he had a good night sleep.   Objective: Vitals:   08/02/22 2316 08/03/22 0312 08/03/22 0437 08/03/22 0747  BP: (!) 134/57 112/85  117/63  Pulse: 85 82  77  Resp: 19 20  18   Temp: 97.8 F (36.6 C) 97.6 F (36.4 C)  98.5 F (36.9 C)  TempSrc: Oral Oral  Oral  SpO2: 91% 91%    Weight:   86.7 kg   Height:        Intake/Output Summary (Last 24 hours) at 08/03/2022 0953 Last data filed at 08/03/2022 0700 Gross per 24 hour  Intake 1910.63 ml  Output 2500 ml  Net -589.37 ml   Filed Weights   08/02/22 1324 08/02/22 1711 08/03/22 0437  Weight: 87 kg 86 kg 86.7 kg    Examination:   More awake and communicative today, but remains significantly confused, but he is more interactive Symmetrical Chest wall movement, Good air movement bilaterally,  CTAB RRR,No Gallops,Rubs or new Murmurs, No Parasternal Heave +ve B.Sounds, Abd Soft, No tenderness, No rebound - guarding or rigidity. Right hand with second and fourth digits status post amputation    Data Reviewed: I have personally reviewed following labs and imaging studies  CBC: Recent Labs  Lab 07/29/22 1438 07/30/22 0244 07/30/22 0814 07/31/22 0925 08/01/22 0219 08/02/22 0605  08/03/22 0525  WBC 7.4  --  6.3 13.3* 7.0 6.6 6.3  NEUTROABS 5.6  --   --   --  5.3  --   --   HGB 10.7*   < > 9.7* 9.4* 9.7* 9.3* 9.3*  HCT 35.2*   < > 32.1* 30.6* 30.8* 30.2* 30.7*  MCV 98.6  --  95.3 97.1 94.2 94.4 96.2  PLT 183  --  154 148* 133* 130* 138*   < > = values in this interval not displayed.   Basic Metabolic Panel: Recent Labs  Lab 07/30/22 0814 07/31/22 0925 08/01/22 0219 08/01/22 1627 08/02/22 0605 08/02/22 0606 08/02/22 1754 08/03/22 0525 08/03/22 0526  NA 134* 133* 132*  --   --  128*  --   --  132*  K 4.8 4.8 3.8  --   --  3.9  --   --  4.1  CL 98 98 95*  --   --  93*  --   --  93*  CO2 22 22 25   --   --  24  --   --  29  GLUCOSE 84 146* 90  --   --  120*  --   --  147*  BUN 46* 54* 21  --   --  29*  --   --  24*  CREATININE 5.85* 6.57* 3.56*  --   --  4.49*  --   --  3.11*  CALCIUM 8.9 8.7* 8.4*  --   --  8.2*  --   --  8.5*  MG 1.4*  --  1.4* 1.8 1.7  --  1.5* 1.5*  --   PHOS 5.2* 6.5* 3.7 4.3  --  4.5 2.1*  --  2.1*   GFR: Estimated Creatinine Clearance: 22.8 mL/min (A) (by C-G formula based on SCr of 3.11 mg/dL (H)). Liver Function Tests: Recent Labs  Lab 07/29/22 1438 07/30/22 0814 07/31/22 0925 08/02/22 0606 08/03/22 0526  AST 13* 10*  --   --   --   ALT 7 7  --   --   --   ALKPHOS 116 99  --   --   --   BILITOT 0.8 0.5  --   --   --   PROT 6.9 6.2*  --   --   --   ALBUMIN 2.9* 2.6* 2.5* 2.1* 2.3*   Recent Labs  Lab 07/29/22 1438  LIPASE 26   Recent Labs  Lab 07/29/22 1820  AMMONIA 20   Coagulation Profile: No results for input(s): "INR", "PROTIME" in the last 168 hours. Cardiac Enzymes: No results for input(s): "CKTOTAL", "CKMB", "CKMBINDEX", "TROPONINI" in the last 168 hours. BNP (last 3 results) No results for input(s): "PROBNP" in the last 8760 hours. HbA1C: No results for input(s): "HGBA1C" in the last 72 hours. CBG: Recent Labs  Lab 08/02/22 1129 08/02/22 1910 08/02/22 1957 08/02/22 2338 08/03/22 0742  GLUCAP  168* 148* 156* 160* 137*   Lipid Profile: No results for input(s): "CHOL", "HDL", "LDLCALC", "TRIG", "CHOLHDL", "LDLDIRECT" in the last 72 hours. Thyroid Function Tests: Recent Labs    08/01/22  0219  TSH 6.685*   Anemia Panel: No results for input(s): "VITAMINB12", "FOLATE", "FERRITIN", "TIBC", "IRON", "RETICCTPCT" in the last 72 hours.  Sepsis Labs: Recent Labs  Lab 07/30/22 0200  LATICACIDVEN 0.6    Recent Results (from the past 240 hour(s))  SARS Coronavirus 2 by RT PCR (hospital order, performed in [facility] hospital lab) *cepheid single result test* Anterior Nasal Swab     Status: None   Collection Time: 07/30/22  1:30 AM   Specimen: Anterior Nasal Swab  Result Value Ref Range Status   SARS Coronavirus 2 by RT PCR NEGATIVE NEGATIVE Final    Comment: Performed at [facility], [address] [place], [place] [address]  MRSA Next Gen by PCR, Nasal     Status: None   Collection Time: 07/30/22  8:35 AM   Specimen: Nasal Mucosa; Nasal Swab  Result Value Ref Range Status   MRSA by PCR Next Gen NOT DETECTED NOT DETECTED Final    Comment: (NOTE) The GeneXpert MRSA Assay (FDA approved for NASAL specimens only), is one component of a comprehensive MRSA colonization surveillance program. It is not intended to diagnose MRSA infection nor to guide or monitor treatment for MRSA infections. Test performance is not FDA approved in patients less than 5 years old. Performed at [facility], [address] [place], [place] [address]   Culture, blood (Routine X 2) w Reflex to ID Panel     Status: None (Preliminary result)   Collection Time: 07/30/22  4:02 PM   Specimen: BLOOD  Result Value Ref Range Status   Specimen Description BLOOD SITE NOT SPECIFIED  Final   Special Requests   Final    BOTTLES DRAWN AEROBIC AND ANAEROBIC Blood Culture adequate volume   Culture   Final    NO GROWTH 4 DAYS Performed at [facility], [address], [place], [place]  [address]    Report Status PENDING  Incomplete  Culture, blood (Routine X 2) w Reflex to ID Panel     Status: None (Preliminary result)   Collection Time: 07/30/22  4:02 PM   Specimen: BLOOD  Result Value Ref Range Status   Specimen Description BLOOD SITE NOT SPECIFIED  Final   Special Requests   Final    BOTTLES DRAWN AEROBIC AND ANAEROBIC Blood Culture adequate volume   Culture   Final    NO GROWTH 4 DAYS Performed at [facility], [address] [place], [place] [address]    Report Status PENDING  Incomplete         Radiology Studies: CT HEAD WO CONTRAST ( )  Result Date: 08/02/2022 CLINICAL DATA:  Stroke follow-up EXAM: CT HEAD WITHOUT CONTRAST TECHNIQUE: Contiguous axial images were obtained from the base of the skull through the vertex without intravenous contrast. RADIATION DOSE REDUCTION: This exam was performed according to the departmental dose-optimization program which includes automated exposure control, adjustment of the mA and/or kV according to patient size and/or use of iterative reconstruction technique. COMPARISON:  CT head 08/01/2022 FINDINGS: Brain: No intracranial hemorrhage or mass effect. Redemonstrated hypoattenuation in the left pons consistent with age-indeterminate infarct (series 3/image 12-13. No hydrocephalus. No extra-axial fluid collection. Cerebral atrophy and ill-defined hypoattenuation within the cerebral white matter consistent with chronic small vessel ischemic disease. Chronic left cerebellar infarct. Vascular: No hyperdense vessel. Intracranial arterial calcification. Skull: No fracture or focal lesion. Sinuses/Orbits: No acute finding. Paranasal sinuses and mastoid air cells are well aerated. Other: None. IMPRESSION:  Redemonstrated hypoattenuation in the left pons consistent with age-indeterminate infarct. No intracranial hemorrhage. Electronically Signed   By: Minerva Fester M.D.   On: 08/02/2022 20:45   ECHOCARDIOGRAM COMPLETE  Result Date:  08/01/2022    ECHOCARDIOGRAM REPORT   Patient Name:   ENDRE FRESCH Date of Exam: 08/01/2022 Medical Rec #:  [id number]             Height:       66.0 in Accession #:    [id number]            Weight:       190.0 lb Date of Birth:  March 29, 1950             BSA:          1.957 m Patient Age:    71 years              BP:           125/79 mmHg Patient Gender: M                     HR:           75 bpm. Exam Location:  Inpatient Procedure: 2D Echo, Cardiac Doppler and Color Doppler Indications:    Atrial Fibrillation I48.91  History:        Patient has no prior history of Echocardiogram examinations.                 CAD; Risk Factors:Hypertension and Diabetes. ESRD.  Sonographer:    Lucendia Herrlich Referring Phys: [phone] DEBBY CROSLEY  Sonographer Comments: Image acquisition challenging due to patient behavioral factors. IMPRESSIONS  1. Left ventricular ejection fraction, by estimation, is 60 to 65%. The left ventricle has normal function. The left ventricle has no regional wall motion abnormalities. There is moderate concentric left ventricular hypertrophy. Left ventricular diastolic parameters are indeterminate.  2. Right ventricular systolic function is normal. The right ventricular size is normal.  3. Trivial mitral valve regurgitation.  4. Aortic valve regurgitation is not visualized. Aortic valve sclerosis/calcification is present, without any evidence of aortic stenosis. FINDINGS  Left Ventricle: Left ventricular ejection fraction, by estimation, is 60 to 65%. The left ventricle has normal function. The left ventricle has no regional wall motion abnormalities. The left ventricular internal cavity size was normal in size. There is  moderate concentric left ventricular hypertrophy. Left ventricular diastolic parameters are indeterminate. Right Ventricle: The right ventricular size is normal. Right vetricular wall thickness was not assessed. Right ventricular systolic function is normal. Left Atrium: Left atrial  size was normal in size. Right Atrium: Right atrial size was normal in size. Pericardium: There is no evidence of pericardial effusion. Mitral Valve: There is mild thickening of the mitral valve leaflet(s). Mild to moderate mitral annular calcification. Trivial mitral valve regurgitation. Tricuspid Valve: The tricuspid valve is normal in structure. Tricuspid valve regurgitation is trivial. Aortic Valve: Aortic valve regurgitation is not visualized. Aortic valve sclerosis/calcification is present, without any evidence of aortic stenosis. Aortic valve peak gradient measures 15.4 mmHg. Pulmonic Valve: The pulmonic valve was normal in structure. Pulmonic valve regurgitation is not visualized. Aorta: The aortic root and ascending aorta are structurally normal, with no evidence of dilitation. IAS/Shunts: No atrial level shunt detected by color flow Doppler.  LEFT VENTRICLE PLAX 2D LVIDd:         4.90 cm   Diastology LVIDs:         3.50 cm   LV  e' medial:    10.10 cm/s LV PW:         1.50 cm   LV E/e' medial:  14.8 LV IVS:        1.60 cm   LV e' lateral:   8.39 cm/s LVOT diam:     2.20 cm   LV E/e' lateral: 17.8 LV SV:         90 LV SV Index:   46 LVOT Area:     3.80 cm  RIGHT VENTRICLE         IVC TAPSE (M-mode): 1.8 cm  IVC diam: 2.70 cm LEFT ATRIUM           Index        RIGHT ATRIUM           Index LA diam:      4.80 cm 2.45 cm/m   RA Area:     15.40 cm LA Vol (A2C): 61.8 ml 31.58 ml/m  RA Volume:   28.80 ml  14.72 ml/m LA Vol (A4C): 59.3 ml 30.30 ml/m  AORTIC VALVE AV Area (Vmax): 2.36 cm AV Vmax:        196.00 cm/s AV Peak Grad:   15.4 mmHg LVOT Vmax:      121.67 cm/s LVOT Vmean:     83.367 cm/s LVOT VTI:       0.237 m  AORTA Ao Root diam: 3.40 cm Ao Asc diam:  3.60 cm MITRAL VALVE                TRICUSPID VALVE MV Area (PHT): 7.99 cm     TR Peak grad:   26.2 mmHg MV Decel Time: 95 msec      TR Vmax:        256.00 cm/s MV E velocity: 149.00 cm/s                             SHUNTS                              Systemic VTI:  0.24 m                             Systemic Diam: 2.20 cm Dietrich Pates MD Electronically signed by Dietrich Pates MD Signature Date/Time: 08/01/2022/3:04:04 PM    Final    DG Abd Portable 1V  Result Date: 08/01/2022 CLINICAL DATA:  NG tube placement. EXAM: PORTABLE ABDOMEN - 1 VIEW COMPARISON:  KUB 07/28/2019, CT abdomen/pelvis 07/29/2022 FINDINGS: The enteric catheter tip is in the region of the pylorus. There is a nonobstructive bowel gas pattern in the imaged abdomen. A spinal stimulator is noted. IMPRESSION: Enteric catheter tip in the region of the pylorus. Electronically Signed   By: Lesia Hausen M.D.   On: 08/01/2022 13:01        Scheduled Meds:  aspirin EC  81 mg Oral Daily   bacitracin   Topical TID   calcitRIOL  0.25 mcg Oral Daily   calcium acetate  667 mg Oral TID WC   Chlorhexidine Gluconate Cloth  6 each Topical Q0600   clopidogrel  75 mg Oral Daily   darbepoetin (ARANESP) injection - DIALYSIS  60 mcg Subcutaneous Q Thu-1800   feeding supplement (PROSource TF20)  60 mL Per Tube Daily   heparin injection (subcutaneous)  5,000 Units Subcutaneous Q8H  levothyroxine  250 mcg Oral Q0600   midodrine  10 mg Oral Q T,Th,Sa-HD   pantoprazole  40 mg Oral BID   QUEtiapine  25 mg Oral QHS   rosuvastatin  10 mg Oral QHS   sodium chloride flush  3 mL Intravenous Q12H   Continuous Infusions:  sodium chloride     feeding supplement (OSMOLITE 1.5 CAL) 60 mL/hr at 08/02/22 0830   valproate sodium 500 mg (08/03/22 0942)     LOS: 4 days     Huey Bienenstock, MD [facility] Available via Epic secure chat 7am-7pm After these hours, please refer to coverage provider listed on [email or web address] 08/03/2022, 9:53 AM

## 2022-08-03 NOTE — Progress Notes (Signed)
Neurology progress note  Subjective: Still getting agitated overnight.  However mentation has improved during the day.  He had a repeat CT head completed which confirms a pontine stroke.  Exam: Vitals:   08/03/22 0312 08/03/22 0747  BP: 112/85 117/63  Pulse: 82 77  Resp: 20 18  Temp: 97.6 F (36.4 C) 98.5 F (36.9 C)  SpO2: 91%    Gen: In bed, NAD Resp: non-labored breathing, no acute distress Neuro: Calm and resting comfortably in bed. He is awake and alert.  He is oriented to person, place.  He knew he was in the hospital.  He knew the current year.  He knew the president. Pupils equal round reactive.  No visual field cut.  Face symmetric. Follows all commands.  No aphasia. Motor exam: No drift in bilateral upper extremities.  Able to both legs off the bed equally. Sensory exam: Normal sensation.   Pertinent Labs: CBC    Component Value Date/Time   WBC 6.3 08/03/2022 0525   RBC 3.19 (L) 08/03/2022 0525   HGB 9.3 (L) 08/03/2022 0525   HCT 30.7 (L) 08/03/2022 0525   PLT 138 (L) 08/03/2022 0525   MCV 96.2 08/03/2022 0525   MCH 29.2 08/03/2022 0525   MCHC 30.3 08/03/2022 0525   RDW 14.6 08/03/2022 0525   LYMPHSABS 0.7 08/01/2022 0219   MONOABS 0.6 08/01/2022 0219   EOSABS 0.3 08/01/2022 0219   BASOSABS 0.0 08/01/2022 0219      Latest Ref Rng & Units 08/03/2022    5:26 AM 08/02/2022    6:06 AM 08/01/2022    2:19 AM  BMP  Glucose 70 - 99 mg/dL 175  102  90   BUN 8 - 23 mg/dL 24  29  21    Creatinine 0.61 - 1.24 mg/dL 5.85  2.77  8.24   Sodium 135 - 145 mmol/L 132  128  132   Potassium 3.5 - 5.1 mmol/L 4.1  3.9  3.8   Chloride 98 - 111 mmol/L 93  93  95   CO2 22 - 32 mmol/L 29  24  25    Calcium 8.9 - 10.3 mg/dL 8.5  8.2  8.4     LTM over 2 days has not captured any seizures.  Initial head CT with a possible questionable focal hypodense lesion in the left Hemi pons-could be age-indeterminate infarct versus artifact.    Repeat head CT confirmed small left  pontine stroke.  Impression: Chase [name] is a 72 y.o. male PMH significant for amputation of right ring finger crush amputation of right index finger 07/17/2022 , on aspirin Plavix and statin, CAD, depression, DVT on warfarin, CKD with MWF dialysis,   7/24 patient had a witnessed seizure.  Initial CTH and EEG Neg.   Cannot get MRI due to spinal stimulator  On exam now back to baseline and is alert and oriented.  Repeat CT 7/27: confirms small left pontine likely small vessel stroke however there are no deficits on exam.  Echo completed on 7/26 EF 65% no shunt.  Recommendations: Overall his encephalopathy has resolved but still has agitation. Aspirin and Plavix for 3 weeks then plavix only.  If he can tolerate dual therapy from anemia standpoint hemoglobin is 9.3. Goal LDL less than 70 .   Neurology will sign off please call with questions.

## 2022-08-03 NOTE — Plan of Care (Signed)

## 2022-08-03 NOTE — Progress Notes (Addendum)
[facility] Progress Note   Subjective:  Seen in room - answered questions a little clearly, but then trying to grab IV pump - remains confused. 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  Objective Vitals:   08/02/22 2316 08/03/22 0312 08/03/22 0437 08/03/22 0747  BP: (!) 134/57 112/85  117/63  Pulse: 85 82  77  Resp: 19 20  18   Temp: 97.8 F (36.6 C) 97.6 F (36.4 C)  98.5 F (36.9 C)  TempSrc: Oral Oral  Oral  SpO2: 91% 91%    Weight:   86.7 kg   Height:       Physical Exam General: Chronically ill appearing, NAD. Confused. King O2 in place. Heart:RRR; no murmur Lungs: CTAB; no rales Abdomen: soft, non-tender Extremities: no LE edema. 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 Dialysis Access: L forearm AVF + bruit  Additional Objective Labs: Basic Metabolic Panel: Recent Labs  Lab 08/01/22 0219 08/01/22 1627 08/02/22 0606 08/02/22 1754 08/03/22 0526  NA 132*  --  128*  --  132*  K 3.8  --  3.9  --  4.1  CL 95*  --  93*  --  93*  CO2 25  --  24  --  29  GLUCOSE 90  --  120*  --  147*  BUN 21  --  29*  --  24*  CREATININE 3.56*  --  4.49*  --  3.11*  CALCIUM 8.4*  --  8.2*  --  8.5*  PHOS 3.7   < > 4.5 2.1* 2.1*   < > = values in this interval not displayed.   Liver Function Tests: Recent Labs  Lab 07/29/22 1438 07/30/22 0814 07/31/22 0925 08/02/22 0606 08/03/22 0526  AST 13* 10*  --   --   --   ALT 7 7  --   --   --   ALKPHOS 116 99  --   --   --   BILITOT 0.8 0.5  --   --   --   PROT 6.9 6.2*  --   --   --   ALBUMIN 2.9* 2.6* 2.5* 2.1* 2.3*   Recent Labs  Lab 07/29/22 1438  LIPASE 26   CBC: Recent Labs  Lab 07/29/22 1438 07/30/22 0244 07/30/22 0814 07/31/22 0925 08/01/22 0219 08/02/22 0605 08/03/22 0525  WBC 7.4  --  6.3 13.3* 7.0 6.6 6.3  NEUTROABS 5.6  --   --   --  5.3  --   --   HGB 10.7*   < > 9.7* 9.4* 9.7* 9.3* 9.3*  HCT 35.2*   < > 32.1* 30.6* 30.8* 30.2* 30.7*  MCV 98.6  --  95.3 97.1 94.2  94.4 96.2  PLT 183  --  154 148* 133* 130* 138*   < > = values in this interval not displayed.   Studies/Results: CT HEAD WO CONTRAST ( )  Result Date: 08/02/2022 CLINICAL DATA:  Stroke follow-up EXAM: CT HEAD WITHOUT CONTRAST TECHNIQUE: Contiguous axial images were obtained from the base of the skull through the vertex without intravenous contrast. RADIATION DOSE REDUCTION: This exam was performed according to the departmental dose-optimization program which includes automated exposure control, adjustment of the mA and/or kV according to patient size and/or use of iterative reconstruction technique. COMPARISON:  CT head 08/01/2022 FINDINGS: Brain: No intracranial hemorrhage or mass effect. Redemonstrated hypoattenuation in the left pons consistent with age-indeterminate infarct (series 3/image 12-13. No hydrocephalus. No extra-axial fluid collection. Cerebral  atrophy and ill-defined hypoattenuation within the cerebral white matter consistent with chronic small vessel ischemic disease. Chronic left cerebellar infarct. Vascular: No hyperdense vessel. Intracranial arterial calcification. Skull: No fracture or focal lesion. Sinuses/Orbits: No acute finding. Paranasal sinuses and mastoid air cells are well aerated. Other: None. IMPRESSION: Redemonstrated hypoattenuation in the left pons consistent with age-indeterminate infarct. No intracranial hemorrhage. Electronically Signed   By: Minerva Fester M.D.   On: 08/02/2022 20:45   ECHOCARDIOGRAM COMPLETE  Result Date: 08/01/2022    ECHOCARDIOGRAM REPORT   Patient Name:   Chase [name] Date of Exam: 08/01/2022 Medical Rec #:  [id number]             Height:       66.0 in Accession #:    [id number]            Weight:       190.0 lb Date of Birth:  [birth year]             BSA:          1.957 m Patient Age:    71 years              BP:           125/79 mmHg Patient Gender: M                     HR:           75 bpm. Exam Location:  Inpatient Procedure: 2D  Echo, Cardiac Doppler and Color Doppler Indications:    Atrial Fibrillation I48.91  History:        Patient has no prior history of Echocardiogram examinations.                 CAD; Risk Factors:Hypertension and Diabetes. ESRD.  Sonographer:    Lucendia Herrlich Referring Phys: [phone] DEBBY CROSLEY  Sonographer Comments: Image acquisition challenging due to patient behavioral factors. IMPRESSIONS  1. Left ventricular ejection fraction, by estimation, is 60 to 65%. The left ventricle has normal function. The left ventricle has no regional wall motion abnormalities. There is moderate concentric left ventricular hypertrophy. Left ventricular diastolic parameters are indeterminate.  2. Right ventricular systolic function is normal. The right ventricular size is normal.  3. Trivial mitral valve regurgitation.  4. Aortic valve regurgitation is not visualized. Aortic valve sclerosis/calcification is present, without any evidence of aortic stenosis. FINDINGS  Left Ventricle: Left ventricular ejection fraction, by estimation, is 60 to 65%. The left ventricle has normal function. The left ventricle has no regional wall motion abnormalities. The left ventricular internal cavity size was normal in size. There is  moderate concentric left ventricular hypertrophy. Left ventricular diastolic parameters are indeterminate. Right Ventricle: The right ventricular size is normal. Right vetricular wall thickness was not assessed. Right ventricular systolic function is normal. Left Atrium: Left atrial size was normal in size. Right Atrium: Right atrial size was normal in size. Pericardium: There is no evidence of pericardial effusion. Mitral Valve: There is mild thickening of the mitral valve leaflet(s). Mild to moderate mitral annular calcification. Trivial mitral valve regurgitation. Tricuspid Valve: The tricuspid valve is normal in structure. Tricuspid valve regurgitation is trivial. Aortic Valve: Aortic valve regurgitation is not  visualized. Aortic valve sclerosis/calcification is present, without any evidence of aortic stenosis. Aortic valve peak gradient measures 15.4 mmHg. Pulmonic Valve: The pulmonic valve was normal in structure. Pulmonic valve regurgitation is not visualized. Aorta: The aortic root and ascending aorta are structurally  normal, with no evidence of dilitation. IAS/Shunts: No atrial level shunt detected by color flow Doppler.  LEFT VENTRICLE PLAX 2D LVIDd:         4.90 cm   Diastology LVIDs:         3.50 cm   LV e' medial:    10.10 cm/s LV PW:         1.50 cm   LV E/e' medial:  14.8 LV IVS:        1.60 cm   LV e' lateral:   8.39 cm/s LVOT diam:     2.20 cm   LV E/e' lateral: 17.8 LV SV:         90 LV SV Index:   46 LVOT Area:     3.80 cm  RIGHT VENTRICLE         IVC TAPSE (M-mode): 1.8 cm  IVC diam: 2.70 cm LEFT ATRIUM           Index        RIGHT ATRIUM           Index LA diam:      4.80 cm 2.45 cm/m   RA Area:     15.40 cm LA Vol (A2C): 61.8 ml 31.58 ml/m  RA Volume:   28.80 ml  14.72 ml/m LA Vol (A4C): 59.3 ml 30.30 ml/m  AORTIC VALVE AV Area (Vmax): 2.36 cm AV Vmax:        196.00 cm/s AV Peak Grad:   15.4 mmHg LVOT Vmax:      121.67 cm/s LVOT Vmean:     83.367 cm/s LVOT VTI:       0.237 m  AORTA Ao Root diam: 3.40 cm Ao Asc diam:  3.60 cm MITRAL VALVE                TRICUSPID VALVE MV Area (PHT): 7.99 cm     TR Peak grad:   26.2 mmHg MV Decel Time: 95 msec      TR Vmax:        256.00 cm/s MV E velocity: 149.00 cm/s                             SHUNTS                             Systemic VTI:  0.24 m                             Systemic Diam: 2.20 cm Dietrich Pates MD Electronically signed by Dietrich Pates MD Signature Date/Time: 08/01/2022/3:04:04 PM    Final    DG Abd Portable 1V  Result Date: 08/01/2022 CLINICAL DATA:  NG tube placement. EXAM: PORTABLE ABDOMEN - 1 VIEW COMPARISON:  K[facility] 07/28/2019, CT abdomen/pelvis 07/29/2022 FINDINGS: The enteric catheter tip is in the region of the pylorus. There is a  nonobstructive bowel gas pattern in the imaged abdomen. A spinal stimulator is noted. IMPRESSION: Enteric catheter tip in the region of the pylorus. Electronically Signed   By: Lesia Hausen M.D.   On: 08/01/2022 13:01    Medications:  sodium chloride     feeding supplement (OSMOLITE 1.5 CAL) 60 mL/hr at 08/02/22 0830   valproate sodium 500 mg (08/03/22 0942)    aspirin EC  81 mg Oral Daily   bacitracin   Topical TID  calcitRIOL  0.25 mcg Oral Daily   Chlorhexidine Gluconate Cloth  6 each Topical Q0600   clopidogrel  75 mg Oral Daily   darbepoetin (ARANESP) injection - DIALYSIS  60 mcg Subcutaneous Q Thu-1800   feeding supplement (PROSource TF20)  60 mL Per Tube Daily   heparin injection (subcutaneous)  5,000 Units Subcutaneous Q8H   levothyroxine  250 mcg Oral Q0600   midodrine  10 mg Oral Q T,Th,Sa-HD   pantoprazole  40 mg Oral BID   QUEtiapine  25 mg Oral QHS   rosuvastatin  10 mg Oral QHS   sodium chloride flush  3 mL Intravenous Q12H    Dialysis Orders: TTS at [facility] 3.5hr, 350/700, EDW 99kg, 2K, AVF, heparin 3700 bolus + 500U/hr pump - HD stopped 7/23 due to acute agitation, had been well prior to HD on 7/23 - Epogen 7600 units q HD   Assessment/Plan: 1. AMS: Acutely started during dialysis on 7/23 - no clear etiology initially. No new meds. Blood Cx 7/24 negative. Does have baseline memory loss and occ confusion per wife, but this is much worse. Initial head CT negative except chronic brain atrophy, repeat 7/26 showed possible lesion in pons - unable to have MRI d/t spinal stimulator -> repeat CT 7/27 showed the same, felt to be small pontine CVA. Had seizure-like activity on 7/24 - started on Keppra. EEG c/w diffuse metabolic encephalopathy. Has chronic back pain with spinal stimulator and on chronic narcotics - on hold. NG tube inserted. Neuro following. Some improvement - need to see him improved further before he returns to outpatient dialysis. 2. ESRD: on HD TTS - has not  missed HD, BUN low - not uremic. Symptoms not improved with HD. Next 7/30. 3. HTN/volume: BP ok, now with NG tube and getting a lot of fluids between meds and TF. Using midodrine pre-HD. 4. Anemia: Hgb 9.3 - continue Aranesp weekly while here. 5. Secondary hyperparathyroidism:  Ca ok, Phos down to 2.1 - stopped binder (Phoslo). Continue VDRA. 6. Nutrition: Alb low -  now has NG tube. 7. 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 Abd pain: On admit - CT abdomen 7/23 without acute infection.    Ozzie Hoyle, PA-C 08/03/2022, 10:59 AM  [employer]

## 2022-08-04 DIAGNOSIS — G9340 Encephalopathy, unspecified: Secondary | ICD-10-CM | POA: Diagnosis not present

## 2022-08-04 DIAGNOSIS — N186 End stage renal disease: Secondary | ICD-10-CM | POA: Diagnosis not present

## 2022-08-04 LAB — GLUCOSE, CAPILLARY
Glucose-Capillary: 126 mg/dL — ABNORMAL HIGH (ref 70–99)
Glucose-Capillary: 115 mg/dL — ABNORMAL HIGH (ref 70–99)
Glucose-Capillary: 134 mg/dL — ABNORMAL HIGH (ref 70–99)
Glucose-Capillary: 99 mg/dL (ref 70–99)
Glucose-Capillary: 104 mg/dL — ABNORMAL HIGH (ref 70–99)
Glucose-Capillary: 127 mg/dL — ABNORMAL HIGH (ref 70–99)

## 2022-08-04 MED ORDER — DIVALPROEX SODIUM 500 MG PO DR TAB
500.0000 mg | DELAYED_RELEASE_TABLET | Freq: Two times a day (BID) | ORAL | Status: DC
  Administered 2022-08-04 – 2022-08-11 (×14): 500 mg via ORAL
  Filled 2022-08-04 (×14): qty 1

## 2022-08-04 NOTE — Progress Notes (Signed)
Advised by CSW that pt may d/c tomorrow. Contacted nephrologist and renal PA to inquire if pt would be appropriate to trial HD in chair tomorrow. Nephrologist feels pt is appropriate for trial and to order HD in chair. Will assist as needed.   Olivia Canter Renal Navigator [phone]

## 2022-08-04 NOTE — Progress Notes (Signed)
Physical Therapy Treatment Patient Details Name: Chase [name] MRN: [id number] DOB: [birth year] Today's Date: 08/04/2022   History of Present Illness 72 y.o. male presenting to [facility] from dialysis 07/29/2022 with abdominal pain and confusion. 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 PMH includes back pain, depression, CAD, DM, CKD, HTN, SDH, ESRD.    PT Comments  Patient received in bed, eyes closed, able to tell me he is in hospital. Requires cues to open eyes and then he is only able to open left eye briefly. Patient with minimal volitional LE movement with cues. Assisted patient with moving B LEs in bed for stretching, rom and requires total assist for bed mobility at this time. He is limited by lethargy. Patient will continue to benefit from skilled PT to continue to work on improving mobility as patient able.       If plan is discharge home, recommend the following: Two people to help with walking and/or transfers;Two people to help with bathing/dressing/bathroom;Assist for transportation;Assistance with feeding;Direct supervision/assist for medications management   Can travel by private vehicle     No  Equipment Recommendations  Other (comment) (TBD)    Recommendations for Other Services       Precautions / Restrictions Restrictions Weight Bearing Restrictions: Yes RUE Weight Bearing: Non weight bearing Other Position/Activity Restrictions: (NWB through 1st and 4th fingers)     Mobility  Bed Mobility Overal bed mobility: Needs Assistance Bed Mobility: Rolling Rolling: Total assist, +2 for physical assistance         General bed mobility comments: Due to level of arousal patient requires max to total assist to move in bed.    Transfers                   General transfer comment: not attempted due to poor arousal level    Ambulation/Gait                   Stairs             Wheelchair Mobility     Tilt Bed     Modified Rankin (Stroke Patients Only)       Balance Overall balance assessment: Needs assistance                                          Cognition Arousal/Alertness: Lethargic Behavior During Therapy: Flat affect Overall Cognitive Status: Difficult to assess                                 General Comments: Pt difficult to arouse. Pt largely keeping eyes closed throughout session and had no vocalizations.        Exercises Other Exercises Other Exercises: B LE rom/stretching exercises with minimal active assist from patient    General Comments        Pertinent Vitals/Pain Pain Assessment Pain Assessment: PAINAD Breathing: normal Negative Vocalization: none Facial Expression: smiling or inexpressive Body Language: relaxed Consolability: no need to console PAINAD Score: 0    Home Living                          Prior Function            PT Goals (current goals can now be found in the care  plan section) Acute Rehab PT Goals PT Goal Formulation: Patient unable to participate in goal setting Time For Goal Achievement: 08/13/22 Progress towards PT goals: Not progressing toward goals - comment (continued lethargy)    Frequency    Min 1X/week      PT Plan Current plan remains appropriate    Co-evaluation              AM-PAC PT "6 Clicks" Mobility   Outcome Measure  Help needed turning from your back to your side while in a flat bed without using bedrails?: Total Help needed moving from lying on your back to sitting on the side of a flat bed without using bedrails?: Total Help needed moving to and from a bed to a chair (including a wheelchair)?: Total Help needed standing up from a chair using your arms (e.g., wheelchair or bedside chair)?: Total Help needed to walk in hospital room?: Total Help needed climbing 3-5 steps with a railing? : Total 6 Click Score: 6    End of Session Equipment Utilized  During Treatment: Oxygen Activity Tolerance: Patient limited by lethargy Patient left: in bed;with call bell/phone within reach;with bed alarm set Nurse Communication: Mobility status PT Visit Diagnosis: Other abnormalities of gait and mobility (R26.89)     Time: 2956-2130 PT Time Calculation (min) (ACUTE ONLY): 11 min  Charges:    $Therapeutic Activity: 8-22 mins PT General Charges $$ ACUTE PT VISIT: 1 Visit                     Algie Westry, PT, GCS 08/04/22,10:17 AM

## 2022-08-04 NOTE — Progress Notes (Signed)
PHARMACIST - PHYSICIAN COMMUNICATION  DR:   Elgergawy  CONCERNING: IV to Oral Route Change Policy  RECOMMENDATION: This patient is receiving valproic acid by the intravenous route.  Based on criteria approved by the Pharmacy and Therapeutics Committee, the intravenous medication(s) is/are being converted to the equivalent oral dose form(s).   DESCRIPTION: These criteria include: The patient is eating (either orally or via tube) and/or has been taking other orally administered medications for a least 24 hours The patient has no evidence of active gastrointestinal bleeding or impaired GI absorption (gastrectomy, short bowel, patient on TNA or NPO).  If you have questions about this conversion, please contact the Pharmacy Department  []   [phone] )  [facility] []   [phone] )  [facility] [x]   [phone] )  [facility] []   [phone] )  [facility] []   [phone] )  [facility]

## 2022-08-04 NOTE — Progress Notes (Signed)
Speech Language Pathology Treatment: Dysphagia  Patient Details Name: Chase [name] MRN: [id number] DOB: February 02, 1950 Today's Date: 08/04/2022 Time: 8657-8469 SLP Time Calculation (min) (ACUTE ONLY): 10 min  Assessment / Plan / Recommendation Clinical Impression  Although pt noted to have age indeterminate pons infarct clinically he seems to be improving with ability to take po's. He was downgraded from Dys 3 (chopped meats) to puree last week due to lethargy. Today he is awake and conversant with therapist. Oral care completed followed by trials of solid texture graham cracker, applesauce and cup/straw sips thin water. Orally he was able to [facility] timely and clear oral cavity over repeated trials. There were no indications of airway compromise with thin liquid throughout session. He was able to help therapist hold cup but with amputation of partial fingers and overall slightly reduced awareness, he will need assist to eat. He now has a Cortrak since last time SLP saw pt. Is it possible to decrease volume or have nocturnal feeds to promote hunger during the day? SLP upgraded diet to Dys 3, continue thin liquids, pills whole in puree and assist with feeding. ST will continue intervention.   HPI HPI: Pt is a 72 yo male presenting to ED from [facility] 7/23 c/o abdominal pain after dialysis and AMS. Presents with dry gangrene of fingers s/p amputation. Admitted for arch aortogram from transfemoral approach with RUE arteriogram with R radial angioplasty 6/19 and subsequently underwent R ring and index finger amputation 7/11. CT 7/27 revealeved redemonstrated hypoattenuation in the left pons consistent with  age-indeterminate infarct   PMH includes ESRD on HD TTS, CAD, hypothyroidism, HTN, anemia, depression/anxiety, GERD, T2DM, chronic back and neck pain      SLP Plan  Continue with current plan of care      Recommendations for follow up therapy are one component of a multi-disciplinary discharge  planning process, led by the attending physician.  Recommendations may be updated based on patient status, additional functional criteria and insurance authorization.    Recommendations  Diet recommendations: Dysphagia 3 (mechanical soft);Thin liquid Liquids provided via: Cup;Straw Medication Administration: Whole meds with puree Supervision: Staff to assist with self feeding Compensations: Slow rate;Small sips/bites Postural Changes and/or Swallow Maneuvers: Seated upright 90 degrees;Upright 30-60 min after meal                  Oral care BID   Frequent or constant Supervision/Assistance Dysphagia, unspecified (R13.10)     Continue with current plan of care     Royce Macadamia  08/04/2022, 9:57 AM

## 2022-08-04 NOTE — Plan of Care (Signed)

## 2022-08-04 NOTE — Progress Notes (Signed)
PROGRESS NOTE    Chase [name]  WUJ:[id number] DOB: July 21, 1950 DOA: 07/29/2022 PCP: [name], MD    Brief Narrative:   72 y.o. male with medical history significant of  ESRD on HD TTS, CAD, hypothyroidism, hypertension, anemia of chronic disease, depression/anxiety, GERD, type 2 diabetes, chronic back and neck pain.  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 Admitted for acute encephalopathy.  Had seizure-like activity on 7/24.  Seen by neurology, LTM with no significant seizure activity, repeat CT head showing left pons infarct   Assessment and Plan:  ESRD (end stage renal disease) (HCC) -nephrology consulted, on HD TTS schedule, BUN is appropriate, no concern for uremia   Suspected seizure -neuro consult, remains on LTM, AED per neurology.  He is currently on Depakote  Acute metabolic encephalopathy - most likely multifactorial secondary to  polypharmacy and underlining dementia vs infection vs seizure as well likely hospital delirium is contributing. -As well now with diagnosis of acute CVA, likely be contributing as well - unable to have MRI of the brain, given he is having spinal stimulator -Hold off on oxycodone -Ammonia within normal limits -Continue with Seroquel -cortrack inserted 7/26 for tube feed.on Tube feed.  Now his mentation is improving, will keep tube feed only at bedtime hopefully we can DC tube feed in 1 to 2 days. -Mentation much improved  Acute CVA -Left pons infarct, not present on initial CT head, but has been confirmed by repeat CT head x 2 -Logen input greatly appreciated, continue with aspirin and Plavix then Plavix alone  Diabetes mellitus (HCC) with hypoglycemia Hypoglycemia -CBG low due to poor oral intake requiring D5W, now he is on tube feed this has just improved on it anymore on D5W.   Hypomagnesemia -repleted  Failure to thrive/protein calorie malnutrition Hypophosphatemia -Patient with poor oral intake for few  days, with low albumin level at 2.3 as well, and for tube feed. phosphorous has dropped, concern for refeeding syndrome, will hold phosphorus binder as discussed with renal team,.  Hypothyroidism Continue synthroid at home dos  Gangrene of finger of right hand [facility]) Status post amputation -Previous MD discussed with ortho-- no need for intervention currently, surgery was done by Dr. Yehuda Budd during recent admission -on  bacitracin ointment  PAD continue  Plavix   DVT prophylaxis: heparin injection 5,000 Units Start: 07/31/22 1400 SCDs Start: 07/30/22 0746    Code Status: Full Code Family Communication: Discussed with wife at bedside daily.  Disposition Plan:  Level of care: Progressive Status : inpatient The patient will require care spanning > 2 midnights and should be moved to inpatient     Consultants:  Renal neuro   Subjective:  No significant events overnight as discussed with staff, patient denies any complaints today.  Objective: Vitals:   08/03/22 2100 08/03/22 2335 08/04/22 0344 08/04/22 0710  BP:  (!) 144/56 121/60   Pulse: 79 79 74   Resp:      Temp: 98.1 F (36.7 C) 99.5 F (37.5 C) 97.9 F (36.6 C)   TempSrc: Oral Oral Axillary   SpO2: 91% 98% 95%   Weight:    87 kg  Height:        Intake/Output Summary (Last 24 hours) at 08/04/2022 1021 Last data filed at 08/04/2022 1017 Gross per 24 hour  Intake 6 ml  Output 900 ml  Net -894 ml   Filed Weights   08/02/22 1711 08/03/22 0437 08/04/22 0710  Weight: 86 kg 86.7 kg 87 kg  Examination:  Awake Alert, frail, deconditioned, he is oriented x 3 today, appears to be more coherent and appropriate today. Symmetrical Chest wall movement, Good air movement bilaterally, CTAB RRR,No Gallops,Rubs or new Murmurs, No Parasternal Heave +ve B.Sounds, Abd Soft, No tenderness, No rebound - guarding or rigidity. No Cyanosis, Clubbing or edema, No new Rash or bruise      Data Reviewed: I have personally  reviewed following labs and imaging studies  CBC: Recent Labs  Lab 07/29/22 1438 07/30/22 0244 07/31/22 0925 08/01/22 0219 08/02/22 0605 08/03/22 0525 08/04/22 0429  WBC 7.4   < > 13.3* 7.0 6.6 6.3 6.1  NEUTROABS 5.6  --   --  5.3  --   --   --   HGB 10.7*   < > 9.4* 9.7* 9.3* 9.3* 9.2*  HCT 35.2*   < > 30.6* 30.8* 30.2* 30.7* 31.3*  MCV 98.6   < > 97.1 94.2 94.4 96.2 96.3  PLT 183   < > 148* 133* 130* 138* 127*   < > = values in this interval not displayed.   Basic Metabolic Panel: Recent Labs  Lab 07/31/22 0925 08/01/22 0219 08/01/22 1627 08/02/22 0605 08/02/22 0606 08/02/22 1754 08/03/22 0525 08/03/22 0526 08/04/22 0429  NA 133* 132*  --   --  128*  --   --  132* 131*  K 4.8 3.8  --   --  3.9  --   --  4.1 4.9  CL 98 95*  --   --  93*  --   --  93* 93*  CO2 22 25  --   --  24  --   --  29 28  GLUCOSE 146* 90  --   --  120*  --   --  147* 139*  BUN 54* 21  --   --  29*  --   --  24* 43*  CREATININE 6.57* 3.56*  --   --  4.49*  --   --  3.11* 3.98*  CALCIUM 8.7* 8.4*  --   --  8.2*  --   --  8.5* 8.7*  MG  --  1.4* 1.8 1.7  --  1.5* 1.5*  --  1.9  PHOS 6.5* 3.7 4.3  --  4.5 2.1*  --  2.1* 2.5   GFR: Estimated Creatinine Clearance: 17.9 mL/min (A) (by C-G formula based on SCr of 3.98 mg/dL (H)). Liver Function Tests: Recent Labs  Lab 07/29/22 1438 07/30/22 0814 07/31/22 0925 08/02/22 0606 08/03/22 0526 08/04/22 0429  AST 13* 10*  --   --   --   --   ALT 7 7  --   --   --   --   ALKPHOS 116 99  --   --   --   --   BILITOT 0.8 0.5  --   --   --   --   PROT 6.9 6.2*  --   --   --   --   ALBUMIN 2.9* 2.6* 2.5* 2.1* 2.3* 2.2*   Recent Labs  Lab 07/29/22 1438  LIPASE 26   Recent Labs  Lab 07/29/22 1820  AMMONIA 20   Coagulation Profile: No results for input(s): "INR", "PROTIME" in the last 168 hours. Cardiac Enzymes: No results for input(s): "CKTOTAL", "CKMB", "CKMBINDEX", "TROPONINI" in the last 168 hours. BNP (last 3 results) No results for  input(s): "PROBNP" in the last 8760 hours. HbA1C: No results for input(s): "HGBA1C" in the last 72  hours. CBG: Recent Labs  Lab 08/03/22 1603 08/03/22 2111 08/03/22 2333 08/04/22 0340 08/04/22 0723  GLUCAP 145* 140* 156* 126* 115*   Lipid Profile: No results for input(s): "CHOL", "HDL", "LDLCALC", "TRIG", "CHOLHDL", "LDLDIRECT" in the last 72 hours. Thyroid Function Tests: No results for input(s): "TSH", "T4TOTAL", "FREET4", "T3FREE", "THYROIDAB" in the last 72 hours.  Anemia Panel: No results for input(s): "VITAMINB12", "FOLATE", "FERRITIN", "TIBC", "IRON", "RETICCTPCT" in the last 72 hours.  Sepsis Labs: Recent Labs  Lab 07/30/22 0200  LATICACIDVEN 0.6    Recent Results (from the past 240 hour(s))  SARS Coronavirus 2 by RT PCR (hospital order, performed in [facility] hospital lab) *cepheid single result test* Anterior Nasal Swab     Status: None   Collection Time: 07/30/22  1:30 AM   Specimen: Anterior Nasal Swab  Result Value Ref Range Status   SARS Coronavirus 2 by RT PCR NEGATIVE NEGATIVE Final    Comment: Performed at [facility], [address], [place], [place] [address]  MRSA Next Gen by PCR, Nasal     Status: None   Collection Time: 07/30/22  8:35 AM   Specimen: Nasal Mucosa; Nasal Swab  Result Value Ref Range Status   MRSA by PCR Next Gen NOT DETECTED NOT DETECTED Final    Comment: (NOTE) The GeneXpert MRSA Assay (FDA approved for NASAL specimens only), is one component of a comprehensive MRSA colonization surveillance program. It is not intended to diagnose MRSA infection nor to guide or monitor treatment for MRSA infections. Test performance is not FDA approved in patients less than 39 years old. Performed at [facility], [address], [place], [place] [address]   Culture, blood (Routine X 2) w Reflex to ID Panel     Status: None (Preliminary result)   Collection Time: 07/30/22  4:02 PM   Specimen: BLOOD  Result Value Ref Range Status    Specimen Description BLOOD SITE NOT SPECIFIED  Final   Special Requests   Final    BOTTLES DRAWN AEROBIC AND ANAEROBIC Blood Culture adequate volume   Culture   Final    NO GROWTH 4 DAYS Performed at [facility], [address] [place], [place] [address]    Report Status PENDING  Incomplete  Culture, blood (Routine X 2) w Reflex to ID Panel     Status: None (Preliminary result)   Collection Time: 07/30/22  4:02 PM   Specimen: BLOOD  Result Value Ref Range Status   Specimen Description BLOOD SITE NOT SPECIFIED  Final   Special Requests   Final    BOTTLES DRAWN AEROBIC AND ANAEROBIC Blood Culture adequate volume   Culture   Final    NO GROWTH 4 DAYS Performed at [facility], [address], [place], [place] [address]    Report Status PENDING  Incomplete         Radiology Studies: CT HEAD WO CONTRAST ( )  Result Date: 08/02/2022 CLINICAL DATA:  Stroke follow-up EXAM: CT HEAD WITHOUT CONTRAST TECHNIQUE: Contiguous axial images were obtained from the base of the skull through the vertex without intravenous contrast. RADIATION DOSE REDUCTION: This exam was performed according to the departmental dose-optimization program which includes automated exposure control, adjustment of the mA and/or kV according to patient size and/or use of iterative reconstruction technique. COMPARISON:  CT head 08/01/2022 FINDINGS: Brain: No intracranial hemorrhage or mass effect. Redemonstrated hypoattenuation in the left pons consistent with age-indeterminate infarct (series 3/image 12-13. No hydrocephalus.  No extra-axial fluid collection. Cerebral atrophy and ill-defined hypoattenuation within the cerebral white matter consistent with chronic small vessel ischemic disease. Chronic left cerebellar infarct. Vascular: No hyperdense vessel. Intracranial arterial calcification. Skull: No fracture or focal lesion. Sinuses/Orbits: No acute finding. Paranasal sinuses and mastoid air cells are well  aerated. Other: None. IMPRESSION: Redemonstrated hypoattenuation in the left pons consistent with age-indeterminate infarct. No intracranial hemorrhage. Electronically Signed   By: Minerva Fester M.D.   On: 08/02/2022 20:45        Scheduled Meds:  aspirin EC  81 mg Oral Daily   bacitracin   Topical TID   calcitRIOL  0.25 mcg Oral Daily   Chlorhexidine Gluconate Cloth  6 each Topical Q0600   clopidogrel  75 mg Oral Daily   darbepoetin (ARANESP) injection - DIALYSIS  60 mcg Subcutaneous Q Thu-1800   divalproex  500 mg Oral Q12H   feeding supplement (PROSource TF20)  60 mL Per Tube Daily   heparin injection (subcutaneous)  5,000 Units Subcutaneous Q8H   levothyroxine  250 mcg Oral Q0600   midodrine  10 mg Oral Q T,Th,Sa-HD   pantoprazole  40 mg Oral BID   QUEtiapine  25 mg Oral QHS   rosuvastatin  10 mg Oral QHS   sodium chloride flush  3 mL Intravenous Q12H   Continuous Infusions:  sodium chloride     feeding supplement (OSMOLITE 1.5 CAL) 60 mL/hr at 08/02/22 0830     LOS: 5 days     Huey Bienenstock, MD [facility] Available via Epic secure chat 7am-7pm After these hours, please refer to coverage provider listed on [email or web address] 08/04/2022, 10:21 AM

## 2022-08-04 NOTE — TOC Progression Note (Addendum)
Transition of Care [facility]) - Progression Note    Patient Details  Name: Chase [name] MRN: [id number] Date of Birth: 1950-03-09  Transition of Care [facility]) CM/SW Contact  Mearl Latin, LCSW Phone Number: 08/04/2022, 8:34 AM  Clinical Narrative:    3:24pm-CSW continuing to follow. Made [place] aware that patient will likely be ready for discharge tomorrow per MD. CSW contacted patient's spouse and she reported that she does not feel he is ready for discharge as he cannot feed himself or walk. CSW explained that SNF may need to assist him with those things. She reported understanding but stated she knows she can appeal a discharge. She also asked if patient will need oxygen. CSW will pass on concerns to MD and requested renal navigator see if patient can sit for Dialysis.  4:29pm-CSW met with MD and he stated to give patient until Wednesday to see how he is eating. Info relayed to spouse.      Expected Discharge Plan: Skilled Nursing Facility Barriers to Discharge: Continued Medical Work up  Expected Discharge Plan and Services In-house Referral: Clinical Social Work   Post Acute Care Choice: Skilled Nursing Facility, Dialysis Living arrangements for the past 2 months: Skilled Nursing Facility                                       Social Determinants of Health (SDOH) Interventions SDOH Screenings   Food Insecurity: No Food Insecurity (07/16/2022)  Housing: Low Risk  (07/16/2022)  Transportation Needs: No Transportation Needs (07/16/2022)  Utilities: Not At Risk (07/16/2022)  Financial Resource Strain: Low Risk  (09/16/2021)   Received from [facility], [facility]  Physical Activity: Inactive (09/16/2021)   Received from [facility], [facility]  Social Connections: Socially Integrated (09/16/2021)   Received from [facility], [facility]  Stress: Patient Declined (09/16/2021)   Received from [facility], [facility]  Tobacco Use: Low Risk  (07/29/2022)     Readmission Risk Interventions    07/31/2022    3:30 PM 07/16/2022    3:22 PM  Readmission Risk Prevention Plan  Transportation Screening Complete Complete  PCP or Specialist Appt within 5-7 Days  Complete  Home Care Screening  Complete  Medication Review (RN CM)  Complete  Medication Review (RN Care Manager) Complete   PCP or Specialist appointment within 3-5 days of discharge Complete   HRI or Home Care Consult Complete   SW Recovery Care/Counseling Consult Complete   Palliative Care Screening Not Applicable   Skilled Nursing Facility Complete

## 2022-08-04 NOTE — Progress Notes (Signed)
[facility] Progress Note   Subjective:   Seen in room - drowsy but answering questions reasonably accurate this morning. Denies CP/dyspnea.  Objective Vitals:   08/03/22 2100 08/03/22 2335 08/04/22 0344 08/04/22 0710  BP:  (!) 144/56 121/60   Pulse: 79 79 74   Resp:      Temp: 98.1 F (36.7 C) 99.5 F (37.5 C) 97.9 F (36.6 C)   TempSrc: Oral Oral Axillary   SpO2: 91% 98% 95%   Weight:    87 kg  Height:       Physical Exam General: Chronically ill appearing, NAD. O2 in place, NG tube in place. Drowsy Heart: RRR; no murmur Lungs: CTAB; no rales Abdomen: soft, non-tender Extremities: trace BUE and BLUE edema. 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 Dialysis Access: L forearm AVF + bruit  Additional Objective Labs: Basic Metabolic Panel: Recent Labs  Lab 08/02/22 0606 08/02/22 1754 08/03/22 0526 08/04/22 0429  NA 128*  --  132* 131*  K 3.9  --  4.1 4.9  CL 93*  --  93* 93*  CO2 24  --  29 28  GLUCOSE 120*  --  147* 139*  BUN 29*  --  24* 43*  CREATININE 4.49*  --  3.11* 3.98*  CALCIUM 8.2*  --  8.5* 8.7*  PHOS 4.5 2.1* 2.1* 2.5   Liver Function Tests: Recent Labs  Lab 07/29/22 1438 07/30/22 0814 07/31/22 0925 08/02/22 0606 08/03/22 0526 08/04/22 0429  AST 13* 10*  --   --   --   --   ALT 7 7  --   --   --   --   ALKPHOS 116 99  --   --   --   --   BILITOT 0.8 0.5  --   --   --   --   PROT 6.9 6.2*  --   --   --   --   ALBUMIN 2.9* 2.6*   < > 2.1* 2.3* 2.2*   < > = values in this interval not displayed.   Recent Labs  Lab 07/29/22 1438  LIPASE 26   CBC: Recent Labs  Lab 07/29/22 1438 07/30/22 0244 07/31/22 0925 08/01/22 0219 08/02/22 0605 08/03/22 0525 08/04/22 0429  WBC 7.4   < > 13.3* 7.0 6.6 6.3 6.1  NEUTROABS 5.6  --   --  5.3  --   --   --   HGB 10.7*   < > 9.4* 9.7* 9.3* 9.3* 9.2*  HCT 35.2*   < > 30.6* 30.8* 30.2* 30.7* 31.3*  MCV 98.6   < > 97.1 94.2 94.4 96.2 96.3  PLT 183   < > 148* 133* 130*  138* 127*   < > = values in this interval not displayed.   Blood Culture    Component Value Date/Time   SDES BLOOD SITE NOT SPECIFIED 07/30/2022 1602   SDES BLOOD SITE NOT SPECIFIED 07/30/2022 1602   SPECREQUEST  07/30/2022 1602    BOTTLES DRAWN AEROBIC AND ANAEROBIC Blood Culture adequate volume   SPECREQUEST  07/30/2022 1602    BOTTLES DRAWN AEROBIC AND ANAEROBIC Blood Culture adequate volume   CULT  07/30/2022 1602    NO GROWTH 4 DAYS Performed at [facility], [address], [place], [place] [address]    CULT  07/30/2022 1602    NO GROWTH 4 DAYS Performed at [facility], [address], [place], [place] [address]  REPTSTATUS PENDING 07/30/2022 1602   REPTSTATUS PENDING 07/30/2022 1602   Studies/Results: CT HEAD WO CONTRAST ( )  Result Date: 08/02/2022 CLINICAL DATA:  Stroke follow-up EXAM: CT HEAD WITHOUT CONTRAST TECHNIQUE: Contiguous axial images were obtained from the base of the skull through the vertex without intravenous contrast. RADIATION DOSE REDUCTION: This exam was performed according to the departmental dose-optimization program which includes automated exposure control, adjustment of the mA and/or kV according to patient size and/or use of iterative reconstruction technique. COMPARISON:  CT head 08/01/2022 FINDINGS: Brain: No intracranial hemorrhage or mass effect. Redemonstrated hypoattenuation in the left pons consistent with age-indeterminate infarct (series 3/image 12-13. No hydrocephalus. No extra-axial fluid collection. Cerebral atrophy and ill-defined hypoattenuation within the cerebral white matter consistent with chronic small vessel ischemic disease. Chronic left cerebellar infarct. Vascular: No hyperdense vessel. Intracranial arterial calcification. Skull: No fracture or focal lesion. Sinuses/Orbits: No acute finding. Paranasal sinuses and mastoid air cells are well aerated. Other: None. IMPRESSION: Redemonstrated hypoattenuation in the left  pons consistent with age-indeterminate infarct. No intracranial hemorrhage. Electronically Signed   By: Minerva Fester M.D.   On: 08/02/2022 20:45    Medications:  sodium chloride     feeding supplement (OSMOLITE 1.5 CAL) 60 mL/hr at 08/02/22 0830    aspirin EC  81 mg Oral Daily   bacitracin   Topical TID   calcitRIOL  0.25 mcg Oral Daily   Chlorhexidine Gluconate Cloth  6 each Topical Q0600   clopidogrel  75 mg Oral Daily   darbepoetin (ARANESP) injection - DIALYSIS  60 mcg Subcutaneous Q Thu-1800   divalproex  500 mg Oral Q12H   feeding supplement (PROSource TF20)  60 mL Per Tube Daily   heparin injection (subcutaneous)  5,000 Units Subcutaneous Q8H   levothyroxine  250 mcg Oral Q0600   midodrine  10 mg Oral Q T,Th,Sa-HD   pantoprazole  40 mg Oral BID   QUEtiapine  25 mg Oral QHS   rosuvastatin  10 mg Oral QHS   sodium chloride flush  3 mL Intravenous Q12H    Dialysis Orders: TTS at [facility] 3.5hr, 350/700, EDW 99kg, 2K, AVF, heparin 3700 bolus + 500U/hr pump - HD stopped 7/23 due to acute agitation, had been well prior to HD on 7/23 - Epogen 7600 units q HD   Assessment/Plan: 1. AMS/small pontine CVA: Acutely started during dialysis on 7/23 - no clear etiology initially. No new meds. Blood Cx 7/24 negative. Does have baseline memory loss and occ confusion per wife, but this is much worse. Initial head CT negative except chronic brain atrophy, repeat 7/26 showed possible lesion in pons - unable to have MRI d/t spinal stimulator -> repeat CT 7/27 showed the same, felt to be small pontine CVA. Had seizure-like activity on 7/24 - started on Keppra. EEG c/w diffuse metabolic encephalopathy. Has chronic back pain with spinal stimulator and on chronic narcotics - on hold. NG tube inserted. Neuro following. Some improvement - need to see him improved further before he returns to outpatient dialysis. 2. ESRD: on HD TTS - has not missed HD, BUN low - not uremic. Symptoms not improved with  HD. Next 7/30. 3. HTN/volume: BP ok, now with NG tube and getting a lot of fluids between meds and TF - developing some edema. Using midodrine pre-HD. 4. Anemia: Hgb 9.3 - continue Aranesp weekly while here. 5. Secondary hyperparathyroidism:  Ca ok, Phos low - binder held 7/28 - better today. Continue VDRA. 6. Nutrition: Alb low -  now has NG tube, although looks that has passed swallow test - possibly restarting diet 7. 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 Abd pain: On admit - CT abdomen 7/23 without acute infection.  Ozzie Hoyle, PA-C 08/04/2022, 10:55 AM  [employer]

## 2022-08-05 DIAGNOSIS — G9340 Encephalopathy, unspecified: Secondary | ICD-10-CM | POA: Diagnosis not present

## 2022-08-05 DIAGNOSIS — I639 Cerebral infarction, unspecified: Secondary | ICD-10-CM | POA: Diagnosis not present

## 2022-08-05 DIAGNOSIS — N186 End stage renal disease: Secondary | ICD-10-CM | POA: Diagnosis not present

## 2022-08-05 LAB — GLUCOSE, CAPILLARY
Glucose-Capillary: 95 mg/dL (ref 70–99)
Glucose-Capillary: 92 mg/dL (ref 70–99)
Glucose-Capillary: 106 mg/dL — ABNORMAL HIGH (ref 70–99)
Glucose-Capillary: 94 mg/dL (ref 70–99)

## 2022-08-05 MED ORDER — OSMOLITE 1.5 CAL PO LIQD
1000.0000 mL | ORAL | Status: DC
  Administered 2022-08-05: 1000 mL
  Filled 2022-08-05 (×2): qty 1000

## 2022-08-05 MED ORDER — PROSOURCE TF20 ENFIT COMPATIBL EN LIQD
60.0000 mL | Freq: Two times a day (BID) | ENTERAL | Status: DC
  Administered 2022-08-05 – 2022-08-07 (×2): 60 mL
  Filled 2022-08-05 (×3): qty 60

## 2022-08-05 MED ORDER — NEPRO/CARBSTEADY PO LIQD
237.0000 mL | Freq: Two times a day (BID) | ORAL | Status: DC
  Administered 2022-08-06 (×2): 237 mL via ORAL

## 2022-08-05 MED ORDER — HEPARIN SODIUM (PORCINE) 1000 UNIT/ML DIALYSIS
2000.0000 [IU] | Freq: Once | INTRAMUSCULAR | Status: AC
  Administered 2022-08-05: 2000 [IU] via INTRAVENOUS_CENTRAL
  Filled 2022-08-05: qty 2

## 2022-08-05 NOTE — Progress Notes (Signed)
   08/05/22 1235  Vitals  Temp 97.8 F (36.6 C)  Pulse Rate 68  Resp 20  BP (!) 119/55  SpO2 98 %  O2 Device Nasal Cannula  Type of Weight Post-Dialysis  Oxygen Therapy  O2 Flow Rate (L/min) 2 L/min  Patient Activity (if Appropriate) In bed  Pulse Oximetry Type Continuous  Post Treatment  Dialyzer Clearance Lightly streaked  Duration of HD Treatment -hour(s) 3.5 hour(s)  Liters Processed 84  Fluid Removed (mL) 2500 mL  Tolerated HD Treatment Yes  AVG/AVF Arterial Site Held (minutes) 5 minutes  AVG/AVF Venous Site Held (minutes) 5 minutes   Received patient in bed to unit.  Alert and oriented.  Informed consent signed and in chart.   TX duration:3.5  Patient tolerated well.  Transported back to the room  Alert, without acute distress.  Hand-off given to patient's nurse.   Access used: Yes Access issues: No  Total UF removed: 2500 Medication(s) given: Midodrine 10 mg, Calcitriol 0.24 mcg, Tylenol 650mg  Post HD VS: See Above Grid Post HD weight: 82.2 kg   [facility]

## 2022-08-05 NOTE — Plan of Care (Signed)

## 2022-08-05 NOTE — Progress Notes (Signed)
Nutrition Follow-up  DOCUMENTATION CODES:   Not applicable  INTERVENTION:  Nocturnal tube feeds via Cortrak: Osmolite 1.5 at 83 mL/hour x 12 hours from 1800 to 0600 (1000 mL goal daily volume) PROSource TF20 60 mL - BID Provides: 1660 kcal, 103 grams of protein, 762 mL H2O daily Renal Multivitamin w/ minerals daily Nepro Shake po BID, each supplement provides 425 kcal and 19 grams protein Feeding assist with all meals   NUTRITION DIAGNOSIS:   Inadequate oral intake related to inability to eat as evidenced by other (comment) (mental status). 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 Breakfast tray in room, untouched. Spoke with MD, MD would like pt TF transitioned to nocturnal to work on PO intake. Discussed with RN.   Meal Intake 7/24: 0-20% x 2 meals 7/27: 0% x 1 meal  Medications reviewed and include: Calcitriol, Protonix Labs reviewed: Sodium 132, Potassium 5.9, Phosphorus 2.5, Magnesium 1.9  CBG: 92-127 x 24 hrs   Diet Order:   Diet Order             DIET DYS 3 Fluid consistency: Thin  Diet effective now                  EDUCATION NEEDS:   Education needs have been addressed  Skin:  Skin Assessment: Skin Integrity Issues: Skin Integrity Issues:: Other (Comment) Other: gangrene  right finger; abrasion left distal thigh  Last BM:  7/26  Height:   Ht Readings from Last 1 Encounters:  07/29/22 5\' 6"  (1.676 m)   Weight:   Wt Readings from Last 1 Encounters:  08/05/22 82.2 kg   BMI:  Body mass index is 29.25 kg/m.  Estimated Nutritional Needs:  Kcal:  2050-2300 Protein:  105-120 grams Fluid:  UOP + 1 L   Kirby Crigler RD, LDN Clinical Dietitian See [facility] for contact information.

## 2022-08-05 NOTE — Progress Notes (Signed)
PROGRESS NOTE    Chase [name]  ZOX:[id number] DOB: 07-25-1950 DOA: 07/29/2022 PCP: Chase Ponds, MD    Brief Narrative:   72 y.o. male with medical history significant of  ESRD on HD TTS, CAD, hypothyroidism, hypertension, anemia of chronic disease, depression/anxiety, GERD, type 2 diabetes, chronic back and neck pain.  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 Admitted for acute encephalopathy.  Had seizure-like activity on 7/24.  Seen by neurology, LTM EEG  with no significant seizure activity, repeat CT head showing left pons infarct   Assessment and Plan:  ESRD (end stage renal disease) (HCC) -nephrology consulted, on HD TTS schedule, BUN is appropriate, no concern for uremia   Suspected seizure -neuro consult, remains on LTM, AED per neurology.  He is currently on Depakote  Acute metabolic encephalopathy - most likely multifactorial secondary to  polypharmacy and underlining dementia vs infection vs seizure as well likely hospital delirium is contributing. -As well now with diagnosis of acute CVA, likely be contributing as well - unable to have MRI of the brain, given he is having spinal stimulator -Hold off on oxycodone -Ammonia within normal limits -Continue with Seroquel -cortrack inserted 7/26 for tube feed.on Tube feed.  Now his mentation is improving, will change to nocturnal Tube feed. -Mentation much improved  Acute CVA -Left pons infarct, not present on initial CT head, but has been confirmed by repeat CT head x 2 -Logen input greatly appreciated, continue with aspirin and Plavix then Plavix alone  Diabetes mellitus (HCC) with hypoglycemia Hypoglycemia -CBG low due to poor oral intake requiring D5W, now he is on tube feed this has just improved on it anymore on D5W.   Hypomagnesemia -repleted  Failure to thrive/protein calorie malnutrition Hypophosphatemia -Patient with poor oral intake for few days, with low albumin level at 2.3 as  well, and for tube feed. phosphorous has dropped, concern for refeeding syndrome, will hold phosphorus binder as discussed with renal team,.  Hypothyroidism Continue synthroid at home dos  Gangrene of finger of right hand [facility]) Status post amputation -Previous MD discussed with ortho-- no need for intervention currently, surgery was done by Dr. Yehuda Budd during recent admission -on  bacitracin ointment  PAD continue  Plavix   DVT prophylaxis: heparin injection 5,000 Units Start: 07/31/22 1400 SCDs Start: 07/30/22 0746    Code Status: Full Code Family Communication: Discussed with wife at bedside daily.  Disposition Plan:  Level of care: Progressive Status : inpatient The patient will require care spanning > 2 midnights and should be moved to inpatient     Consultants:  Renal neuro   Subjective:    Objective: Vitals:   08/05/22 1212 08/05/22 1235 08/05/22 1256 08/05/22 1317  BP: (!) 127/42 (!) 119/55  (!) 123/53  Pulse: 66 68  64  Resp: 17 20    Temp:  97.8 F (36.6 C)  97.8 F (36.6 C)  TempSrc:    Oral  SpO2: 100% 98%    Weight:   82.2 kg   Height:        Intake/Output Summary (Last 24 hours) at 08/05/2022 1412 Last data filed at 08/05/2022 1235 Gross per 24 hour  Intake 1196 ml  Output 3200 ml  Net -2004 ml   Filed Weights   08/05/22 0500 08/05/22 0819 08/05/22 1256  Weight: 86.8 kg 85.5 kg 82.2 kg    Examination:  Awake Alert,more conversant and appropriate today, remains confused though Symmetrical Chest wall movement, Good air movement bilaterally, CTAB RRR,No  Gallops,Rubs or new Murmurs, No Parasternal Heave +ve B.Sounds, Abd Soft, No tenderness, No rebound - guarding or rigidity. No Cyanosis, Clubbing or edema, No new Rash or bruise      Data Reviewed: I have personally reviewed following labs and imaging studies  CBC: Recent Labs  Lab 07/29/22 1438 07/30/22 0244 08/01/22 0219 08/02/22 0605 08/03/22 0525 08/04/22 0429 08/05/22 0034   WBC 7.4   < > 7.0 6.6 6.3 6.1 6.4  NEUTROABS 5.6  --  5.3  --   --   --   --   HGB 10.7*   < > 9.7* 9.3* 9.3* 9.2* 10.1*  HCT 35.2*   < > 30.8* 30.2* 30.7* 31.3* 33.3*  MCV 98.6   < > 94.2 94.4 96.2 96.3 94.3  PLT 183   < > 133* 130* 138* 127* 139*   < > = values in this interval not displayed.   Basic Metabolic Panel: Recent Labs  Lab 08/01/22 0219 08/01/22 1627 08/02/22 0605 08/02/22 0606 08/02/22 1754 08/03/22 0525 08/03/22 0526 08/04/22 0429 08/05/22 0034  NA 132*  --   --  128*  --   --  132* 131* 132*  K 3.8  --   --  3.9  --   --  4.1 4.9 5.9*  CL 95*  --   --  93*  --   --  93* 93* 93*  CO2 25  --   --  24  --   --  29 28 28   GLUCOSE 90  --   --  120*  --   --  147* 139* 133*  BUN 21  --   --  29*  --   --  24* 43* 55*  CREATININE 3.56*  --   --  4.49*  --   --  3.11* 3.98* 4.69*  CALCIUM 8.4*  --   --  8.2*  --   --  8.5* 8.7* 9.1  MG 1.4*   < > 1.7  --  1.5* 1.5*  --  1.9 1.9  PHOS 3.7   < >  --  4.5 2.1*  --  2.1* 2.5 2.5   < > = values in this interval not displayed.   GFR: Estimated Creatinine Clearance: 14.8 mL/min (A) (by C-G formula based on SCr of 4.69 mg/dL (H)). Liver Function Tests: Recent Labs  Lab 07/29/22 1438 07/30/22 0814 07/31/22 0925 08/02/22 0606 08/03/22 0526 08/04/22 0429 08/05/22 0034  AST 13* 10*  --   --   --   --   --   ALT 7 7  --   --   --   --   --   ALKPHOS 116 99  --   --   --   --   --   BILITOT 0.8 0.5  --   --   --   --   --   PROT 6.9 6.2*  --   --   --   --   --   ALBUMIN 2.9* 2.6* 2.5* 2.1* 2.3* 2.2* 2.3*   Recent Labs  Lab 07/29/22 1438  LIPASE 26   Recent Labs  Lab 07/29/22 1820  AMMONIA 20   Coagulation Profile: No results for input(s): "INR", "PROTIME" in the last 168 hours. Cardiac Enzymes: No results for input(s): "CKTOTAL", "CKMB", "CKMBINDEX", "TROPONINI" in the last 168 hours. BNP (last 3 results) No results for input(s): "PROBNP" in the last 8760 hours. HbA1C: No results for input(s): "HGBA1C"  in  the last 72 hours. CBG: Recent Labs  Lab 08/04/22 1548 08/04/22 2008 08/04/22 2302 08/05/22 0345 08/05/22 1331  GLUCAP 99 104* 127* 95 92   Lipid Profile: No results for input(s): "CHOL", "HDL", "LDLCALC", "TRIG", "CHOLHDL", "LDLDIRECT" in the last 72 hours. Thyroid Function Tests: No results for input(s): "TSH", "T4TOTAL", "FREET4", "T3FREE", "THYROIDAB" in the last 72 hours.  Anemia Panel: No results for input(s): "VITAMINB12", "FOLATE", "FERRITIN", "TIBC", "IRON", "RETICCTPCT" in the last 72 hours.  Sepsis Labs: Recent Labs  Lab 07/30/22 0200  LATICACIDVEN 0.6    Recent Results (from the past 240 hour(s))  SARS Coronavirus 2 by RT PCR (hospital order, performed in [facility] hospital lab) *cepheid single result test* Anterior Nasal Swab     Status: None   Collection Time: 07/30/22  1:30 AM   Specimen: Anterior Nasal Swab  Result Value Ref Range Status   SARS Coronavirus 2 by RT PCR NEGATIVE NEGATIVE Final    Comment: Performed at [facility], [address] [place], [place] [address]  MRSA Next Gen by PCR, Nasal     Status: None   Collection Time: 07/30/22  8:35 AM   Specimen: Nasal Mucosa; Nasal Swab  Result Value Ref Range Status   MRSA by PCR Next Gen NOT DETECTED NOT DETECTED Final    Comment: (NOTE) The GeneXpert MRSA Assay (FDA approved for NASAL specimens only), is one component of a comprehensive MRSA colonization surveillance program. It is not intended to diagnose MRSA infection nor to guide or monitor treatment for MRSA infections. Test performance is not FDA approved in patients less than 62 years old. Performed at [facility], [address], [place], [place] [address]   Culture, blood (Routine X 2) w Reflex to ID Panel     Status: None   Collection Time: 07/30/22  4:02 PM   Specimen: BLOOD  Result Value Ref Range Status   Specimen Description BLOOD SITE NOT SPECIFIED  Final   Special Requests   Final    BOTTLES DRAWN AEROBIC AND  ANAEROBIC Blood Culture adequate volume   Culture   Final    NO GROWTH 5 DAYS Performed at [facility], [address], [place], [place] [address]    Report Status 08/04/2022 FINAL  Final  Culture, blood (Routine X 2) w Reflex to ID Panel     Status: None   Collection Time: 07/30/22  4:02 PM   Specimen: BLOOD  Result Value Ref Range Status   Specimen Description BLOOD SITE NOT SPECIFIED  Final   Special Requests   Final    BOTTLES DRAWN AEROBIC AND ANAEROBIC Blood Culture adequate volume   Culture   Final    NO GROWTH 5 DAYS Performed at [facility], [address] [place], [place] [address]    Report Status 08/04/2022 FINAL  Final         Radiology Studies: No results found.      Scheduled Meds:  aspirin EC  81 mg Oral Daily   bacitracin   Topical TID   calcitRIOL  0.25 mcg Oral Daily   Chlorhexidine Gluconate Cloth  6 each Topical Q0600   clopidogrel  75 mg Oral Daily   darbepoetin (ARANESP) injection - DIALYSIS  60 mcg Subcutaneous Q Thu-1800   divalproex  500 mg Oral Q12H   feeding supplement (PROSource TF20)  60 mL Per Tube Daily   heparin injection (subcutaneous)  5,000 Units Subcutaneous Q8H   levothyroxine  250 mcg Oral Q0600   midodrine  10 mg Oral Q T,Th,Sa-HD   pantoprazole  40 mg Oral BID   QUEtiapine  25 mg Oral QHS   rosuvastatin  10 mg Oral QHS   sodium chloride flush  3 mL Intravenous Q12H   Continuous Infusions:  sodium chloride     feeding supplement (OSMOLITE 1.5 CAL) 1,000 mL (08/04/22 1929)     LOS: 6 days     Huey Bienenstock, MD [facility] Available via Epic secure chat 7am-7pm After these hours, please refer to coverage provider listed on [email or web address] 08/05/2022, 2:12 PM

## 2022-08-05 NOTE — Plan of Care (Signed)
°  Problem: Health Behavior/Discharge Planning: °Goal: Ability to manage health-related needs will improve °Outcome: Progressing °  °Problem: Clinical Measurements: °Goal: Diagnostic test results will improve °Outcome: Progressing °  °Problem: Pain Managment: °Goal: General experience of comfort will improve °Outcome: Progressing °  °

## 2022-08-05 NOTE — Progress Notes (Signed)
Received Pt in Dialysis with wet bedding and gown, mattress was soil.Delayed in treatment.  Dialysis Tech and RN change pt. RN notified 5W bedside Copywriter, advertising.

## 2022-08-05 NOTE — Progress Notes (Signed)
[facility] Progress Note   Subjective:   Seen at dialysis.  Oriented to self only, 1994 and "[facility] facility".  Calm at dialysis.   Objective Vitals:   08/05/22 0930 08/05/22 1000 08/05/22 1030 08/05/22 1100  BP: (!) 103/39 (!) 120/37 (!) 122/50 (!) 119/56  Pulse: 66 64 65 69  Resp: (!) 22 20 20 18   Temp:      TempSrc:      SpO2: 95% 98% 98% 100%  Weight:      Height:       Physical Exam General: Chronically ill appearing, NAD. O2 in place, NG tube in place. Drowsy Heart: RRR; no murmur Lungs: CTAB; no rales Abdomen: soft, non-tender Extremities: trace BUE and BLUE edema. 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 Dialysis Access: L forearm AVF + bruit  Additional Objective Labs: Basic Metabolic Panel: Recent Labs  Lab 08/03/22 0526 08/04/22 0429 08/05/22 0034  NA 132* 131* 132*  K 4.1 4.9 5.9*  CL 93* 93* 93*  CO2 29 28 28   GLUCOSE 147* 139* 133*  BUN 24* 43* 55*  CREATININE 3.11* 3.98* 4.69*  CALCIUM 8.5* 8.7* 9.1  PHOS 2.1* 2.5 2.5   Liver Function Tests: Recent Labs  Lab 07/29/22 1438 07/30/22 0814 07/31/22 0925 08/03/22 0526 08/04/22 0429 08/05/22 0034  AST 13* 10*  --   --   --   --   ALT 7 7  --   --   --   --   ALKPHOS 116 99  --   --   --   --   BILITOT 0.8 0.5  --   --   --   --   PROT 6.9 6.2*  --   --   --   --   ALBUMIN 2.9* 2.6*   < > 2.3* 2.2* 2.3*   < > = values in this interval not displayed.   Recent Labs  Lab 07/29/22 1438  LIPASE 26   CBC: Recent Labs  Lab 07/29/22 1438 07/30/22 0244 08/01/22 0219 08/02/22 0605 08/03/22 0525 08/04/22 0429 08/05/22 0034  WBC 7.4   < > 7.0 6.6 6.3 6.1 6.4  NEUTROABS 5.6  --  5.3  --   --   --   --   HGB 10.7*   < > 9.7* 9.3* 9.3* 9.2* 10.1*  HCT 35.2*   < > 30.8* 30.2* 30.7* 31.3* 33.3*  MCV 98.6   < > 94.2 94.4 96.2 96.3 94.3  PLT 183   < > 133* 130* 138* 127* 139*   < > = values in this interval not displayed.   Blood Culture    Component Value  Date/Time   SDES BLOOD SITE NOT SPECIFIED 07/30/2022 1602   SDES BLOOD SITE NOT SPECIFIED 07/30/2022 1602   SPECREQUEST  07/30/2022 1602    BOTTLES DRAWN AEROBIC AND ANAEROBIC Blood Culture adequate volume   SPECREQUEST  07/30/2022 1602    BOTTLES DRAWN AEROBIC AND ANAEROBIC Blood Culture adequate volume   CULT  07/30/2022 1602    NO GROWTH 5 DAYS Performed at [facility], [address] [place], [place] [address]    CULT  07/30/2022 1602    NO GROWTH 5 DAYS Performed at [facility], [address] [place], [place] [address]    REPTSTATUS 08/04/2022 FINAL 07/30/2022 1602   REPTSTATUS 08/04/2022 FINAL 07/30/2022 1602   Studies/Results: No results found.  Medications:  sodium chloride     feeding  supplement (OSMOLITE 1.5 CAL) 1,000 mL (08/04/22 1929)    aspirin EC  81 mg Oral Daily   bacitracin   Topical TID   calcitRIOL  0.25 mcg Oral Daily   Chlorhexidine Gluconate Cloth  6 each Topical Q0600   clopidogrel  75 mg Oral Daily   darbepoetin (ARANESP) injection - DIALYSIS  60 mcg Subcutaneous Q Thu-1800   divalproex  500 mg Oral Q12H   feeding supplement (PROSource TF20)  60 mL Per Tube Daily   heparin injection (subcutaneous)  5,000 Units Subcutaneous Q8H   levothyroxine  250 mcg Oral Q0600   midodrine  10 mg Oral Q T,Th,Sa-HD   pantoprazole  40 mg Oral BID   QUEtiapine  25 mg Oral QHS   rosuvastatin  10 mg Oral QHS   sodium chloride flush  3 mL Intravenous Q12H    Dialysis Orders: TTS at [facility] 3.5hr, 350/700, EDW 99kg, 2K, AVF, heparin 3700 bolus + 500U/hr pump - HD stopped 7/23 due to acute agitation, had been well prior to HD on 7/23 - Epogen 7600 units q HD   Assessment/Plan: 1. AMS/small pontine CVA: Acutely started during dialysis on 7/23 - no clear etiology initially. No new meds. Blood Cx 7/24 negative. Does have baseline memory loss and occ confusion per wife, but this is much worse. Initial head CT negative except chronic brain atrophy,  repeat 7/26 showed possible lesion in pons - unable to have MRI d/t spinal stimulator -> repeat CT 7/27 showed the same, felt to be small pontine CVA. Had seizure-like activity on 7/24 - started on Keppra. EEG c/w diffuse metabolic encephalopathy. Has chronic back pain with spinal stimulator and on chronic narcotics - on hold. NG tube inserted. Neuro following. Some improvement but does not appear back to baseline.   2. ESRD: on HD TTS - has not missed HD, BUN low - not uremic. Symptoms not improved with HD. HD today.  3. HTN/volume: BP ok, volume looks ok.  UOP good, tolerating some UF with HD. Using midodrine pre-HD. 4. Anemia: Hgb 9.3 - continue Aranesp weekly while here. 5. Secondary hyperparathyroidism:  Ca ok, Phos low - binder held 7/28 - better today. Continue VDRA. 6. Nutrition: Alb low -  now has NG tube, although looks that has passed swallow test - possibly restarting diet 7. 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 Abd pain: On admit - CT abdomen 7/23 without acute infection.  Intended for patient to come to dialysis in a chair today but did come in bed.  He should be getting up to chair in the room and if he's otherwise ready to discharge and has been able to sit in chair for multiple hours would not hold up discharge at this point.  However, based on today's mental status exam and NG tube in place doubt he's ready for discharge.    Estill Bakes MD [facility] Pager [phone]

## 2022-08-06 DIAGNOSIS — I96 Gangrene, not elsewhere classified: Secondary | ICD-10-CM | POA: Diagnosis not present

## 2022-08-06 DIAGNOSIS — G9340 Encephalopathy, unspecified: Secondary | ICD-10-CM | POA: Diagnosis not present

## 2022-08-06 DIAGNOSIS — R569 Unspecified convulsions: Secondary | ICD-10-CM | POA: Diagnosis not present

## 2022-08-06 DIAGNOSIS — N186 End stage renal disease: Secondary | ICD-10-CM | POA: Diagnosis not present

## 2022-08-06 LAB — GLUCOSE, CAPILLARY
Glucose-Capillary: 105 mg/dL — ABNORMAL HIGH (ref 70–99)
Glucose-Capillary: 111 mg/dL — ABNORMAL HIGH (ref 70–99)
Glucose-Capillary: 115 mg/dL — ABNORMAL HIGH (ref 70–99)
Glucose-Capillary: 83 mg/dL (ref 70–99)
Glucose-Capillary: 83 mg/dL (ref 70–99)
Glucose-Capillary: 88 mg/dL (ref 70–99)
Glucose-Capillary: 96 mg/dL (ref 70–99)

## 2022-08-06 LAB — MAGNESIUM: Magnesium: 1.7 mg/dL (ref 1.7–2.4)

## 2022-08-06 MED ORDER — MAGNESIUM SULFATE IN D5W 1-5 GM/100ML-% IV SOLN
1.0000 g | Freq: Once | INTRAVENOUS | Status: AC
  Administered 2022-08-06: 1 g via INTRAVENOUS
  Filled 2022-08-06: qty 100

## 2022-08-06 NOTE — Progress Notes (Signed)
Subjective: seen in rm with wife , [facility], wed , calm , no cos . DW with him need for [facility] HD tomor  he agrees   Objective Vital signs in last 24 hours: Vitals:   08/06/22 0052 08/06/22 0332 08/06/22 0544 08/06/22 0800  BP:  (!) 135/47  (!) 137/37  Pulse: 73 70 68 74  Resp: 20 18 (!) 24 16  Temp:  98.5 F (36.9 C)  98.3 F (36.8 C)  TempSrc:  Axillary  Axillary  SpO2: 95% 92% 94% 94%  Weight: 86 kg  83 kg   Height:       Weight change: -1.5 kg  Physical Exam: General: Alert, pleasant this am / Cortrak  present ,Chronically ill appearing  Elderly Male , NAD  Heart: RRR, No MRG Lungs: CTA  bilat, nonlabored breathtaking  Abdomen: NABS, soft, NT, ND  Extremities: no pedal edema / R HAND  partial 2nd/ 4th fingers    Dialysis Access:  LFA AVF + Bruit     Dialysis Orders: TTS at [facility] 3.5hr, 350/700, EDW 99kg, 2K, AVF, heparin 3700 bolus + 500U/hr pump - HD stopped 7/23 due to acute agitation, had been well prior to HD on 7/23 - Epogen 7600 units q HD   Assessment/Plan: 1. AMS/small pontine CVA: Acutely started during dialysis on 7/23 - no clear etiology initially. No new meds. Blood Cx 7/24 negative. Does have baseline memory loss and occ confusion per wife, but this is much worse. Initial head CT negative except chronic brain atrophy, repeat 7/26 showed possible lesion in pons - unable to have MRI d/t spinal stimulator -> repeat CT 7/27 showed the same, felt to be small pontine CVA. Had seizure-like activity on 7/24 - started on Keppra. EEG c/w diffuse metabolic encephalopathy. Has chronic back pain with spinal stimulator and on chronic narcotics - on hold. NG tube was inserted. Now  has Cortak  for Nocturnal tube feeds / Neuro following. Some improvement but does not appear back to baseline.   2. ESRD: on HD TTS - has not missed HD, BUN low - not uremic. Symptoms not improved with HD. HD  tomor in [facility]   3. HTN/volume: BP ok, volume looks ok.  UOP good,  tolerating some UF with HD. Using midodrine pre-HD. 4. Anemia: Hgb 10.5  - continue Aranesp weekly while here. 5. Secondary hyperparathyroidism:  Ca Corec 10.2 , Phos low 2.3 < 2.5 <2.1  binder held 7/28 - .  Ck PTH level , hold vit d  6. Nutrition:Alb low  2.4-  now has NG tube, although looks that has passed swallow test - possibly restarting diet 7. 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 Abd pain: On admit - CT abdomen 7/23 without acute infection.  Lenny Pastel, PA-C [facility] Beeper [phone] 08/06/2022,10:07 AM  LOS: 7 days   Labs: Basic Metabolic Panel: Recent Labs  Lab 08/04/22 0429 08/05/22 0034 08/06/22 0154  NA 131* 132* 132*  K 4.9 5.9* 5.0  CL 93* 93* 92*  CO2 28 28 29   GLUCOSE 139* 133* 107*  BUN 43* 55* 32*  CREATININE 3.98* 4.69* 3.17*  CALCIUM 8.7* 9.1 8.9  PHOS 2.5 2.5 2.3*   Liver Function Tests: Recent Labs  Lab 08/04/22 0429 08/05/22 0034 08/06/22 0154  ALBUMIN 2.2* 2.3* 2.4*   No results for input(s): "LIPASE", "AMYLASE" in the last 168 hours. No results for input(s): "AMMONIA" in the last 168 hours.  CBC: Recent Labs  Lab 08/01/22 0219 08/02/22 0605 08/03/22 0525 08/04/22 0429 08/05/22 0034 08/06/22 0154  WBC 7.0 6.6 6.3 6.1 6.4 7.0  NEUTROABS 5.3  --   --   --   --   --   HGB 9.7* 9.3* 9.3* 9.2* 10.1* 10.5*  HCT 30.8* 30.2* 30.7* 31.3* 33.3* 34.6*  MCV 94.2 94.4 96.2 96.3 94.3 96.6  PLT 133* 130* 138* 127* 139* 141*   Cardiac Enzymes: No results for input(s): "CKTOTAL", "CKMB", "CKMBINDEX", "TROPONINI" in the last 168 hours. CBG: Recent Labs  Lab 08/05/22 1605 08/05/22 1959 08/06/22 0055 08/06/22 0330 08/06/22 0800  GLUCAP 106* 94 105* 83 88    Studies/Results: No results found. Medications:  sodium chloride     magnesium sulfate bolus IVPB      aspirin EC  81 mg Oral Daily   bacitracin   Topical TID   calcitRIOL  0.25 mcg Oral Daily   Chlorhexidine Gluconate Cloth  6  each Topical Q0600   clopidogrel  75 mg Oral Daily   darbepoetin (ARANESP) injection - DIALYSIS  60 mcg Subcutaneous Q Thu-1800   divalproex  500 mg Oral Q12H   feeding supplement (NEPRO CARB STEADY)  237 mL Oral BID BM   feeding supplement (OSMOLITE 1.5 CAL)  1,000 mL Per Tube Q24H   feeding supplement (PROSource TF20)  60 mL Per Tube BID   heparin injection (subcutaneous)  5,000 Units Subcutaneous Q8H   levothyroxine  250 mcg Oral Q0600   midodrine  10 mg Oral Q T,Th,Sa-HD   pantoprazole  40 mg Oral BID   QUEtiapine  25 mg Oral QHS   rosuvastatin  10 mg Oral QHS   sodium chloride flush  3 mL Intravenous Q12H

## 2022-08-06 NOTE — Plan of Care (Signed)

## 2022-08-06 NOTE — Progress Notes (Signed)
Tele sitter called for me to check on patient as he had pulled his mitt off. When entering room, patient had tele leads off, mitt off, O2 off and Corpak pulled out and in the floor. MD notified. Mitts back on.

## 2022-08-06 NOTE — Progress Notes (Signed)
Speech Language Pathology Treatment: Dysphagia  Patient Details Name: Chase [name] MRN: [id number] DOB: 01-Feb-1950 Today's Date: 08/06/2022 Time: 4010-2725 SLP Time Calculation (min) (ACUTE ONLY): 11 min  Assessment / Plan / Recommendation Clinical Impression  Pt seen for dysphagia intervention with wife present. She reported pt initially needed chopped meat/food at [facility] due to difficulty swallowing but now needs due to inability to chop meats given 2 digit amputation on right hand. He masticated rice cake (from wife) timely without residue over several trials and consumed straw sips thin water without indications of airway intrusion. He appears to be at baseline and could tolerate regular diet from oral standpoint however wife wishes to continue with Dys 3/chopped meats given pt unable to cut his food presently.  Continue Dys 3/thin liquids and pills with water (if difficulty can administer whole in puree). Wife stated he was able to hold cup in left hand and recommend pt continue this and attempt to feed himself with left hand. No further ST is needed.   HPI HPI: Pt is a 72 yo male presenting to ED from [facility] 7/23 c/o abdominal pain after dialysis and AMS. Presents with dry gangrene of fingers s/p amputation. Admitted for arch aortogram from transfemoral approach with RUE arteriogram with R radial angioplasty 6/19 and subsequently underwent R ring and index finger amputation 7/11. CT 7/27 revealeved redemonstrated hypoattenuation in the left pons consistent with  age-indeterminate infarct   PMH includes ESRD on HD TTS, CAD, hypothyroidism, HTN, anemia, depression/anxiety, GERD, T2DM, chronic back and neck pain      SLP Plan  All goals met;Discharge SLP treatment due to (comment)      Recommendations for follow up therapy are one component of a multi-disciplinary discharge planning process, led by the attending physician.  Recommendations may be updated based on patient status,  additional functional criteria and insurance authorization.    Recommendations  Diet recommendations: Dysphagia 3 (mechanical soft);Thin liquid (due to 2 digit amputation on right hand) Liquids provided via: Cup;Straw Medication Administration: Whole meds with liquid Supervision: Staff to assist with self feeding (per wife pt can use left hand to hold cup) Compensations: Slow rate;Small sips/bites Postural Changes and/or Swallow Maneuvers: Seated upright 90 degrees;Upright 30-60 min after meal                  Oral care BID   Frequent or constant Supervision/Assistance Dysphagia, unspecified (R13.10)     All goals met;Discharge SLP treatment due to (comment)     Royce Macadamia  08/06/2022, 10:05 AM

## 2022-08-06 NOTE — Progress Notes (Signed)
PROGRESS NOTE        PATIENT DETAILS Name: Chase [name] Age: 72 y.o. Sex: male Date of Birth: 01-06-1951 Admit Date: 07/29/2022 Admitting Physician Joseph Art, DO UJW:[id number] Alinda Dooms, MD  Brief Summary: Patient is a 72 y.o.  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  See below for further details.  Significant events: 7/23>> admit to [facility] 7/24>> seizure-like activity-neuro consult.  Significant studies: 7/23>> CT head: No acute intracranial abnormality 7/23>> CT abdomen/pelvis: Stable intra/extrahepatic biliary ductal dilatation-with hyperdense material in the CBD-compatible with choledocholithiasis.  Left lower abdominal wall intramuscular hematoma has decreased in size. 7/23>> RUQ ultrasound: Cholecystectomy-otherwise unremarkable RUQ ultrasound. 7/24>> CT head: No acute finding. 7/24-7/25>> LTM EEG: No definite seizures were seen. 7/26>> CT head: Focal hypodense lesion in the left hemipons-possible age-indeterminate CVA 7/26>> echo: EF 60-65% 7/27>> CT head: Redemonstrated hypoattenuation in the left pons consistent with age-indeterminate infarct.  Significant microbiology data: 7/24>> blood culture: No growth 7/24>> COVID PCR: Negative  Procedures: None   Consults: None  Subjective: Confused but upon asking questions repeatedly-able to answer some questions appropriately.  No family at bedside.  Appears to have pulled out his cortrak tube overnight.  Objective: Vitals: Blood pressure (!) 156/80, pulse 74, temperature 98.1 F (36.7 C), temperature source Oral, resp. rate 17, height 5\' 6"  (1.676 m), weight 83 kg, SpO2 95%.   Exam: Gen Exam:Alert awake-not in any distress HEENT:atraumatic, normocephalic Chest: B/L clear  to auscultation anteriorly CVS:S1S2 regular Abdomen:soft non tender, non distended Extremities:no edema Neurology: Difficult exam but seems to be moving all 4 extremities. Skin: no rash  Pertinent Labs/Radiology:    Latest Ref Rng & Units 08/06/2022    1:54 AM 08/05/2022   12:34 AM 08/04/2022    4:29 AM  CBC  WBC 4.0 - 10.5 K/uL 7.0  6.4  6.1   Hemoglobin 13.0 - 17.0 g/dL 78.2  95.6  9.2   Hematocrit 39.0 - 52.0 % 34.6  33.3  31.3   Platelets 150 - 400 K/uL 141  139  127     Lab Results  Component Value Date   NA 132 (L) 08/06/2022   K 5.0 08/06/2022   CL 92 (L) 08/06/2022   CO2 29 08/06/2022      Assessment/Plan: Seizure disorder Found to have seizure-like activity on 7/24 Neuroconsulted-on Depakote Perhaps triggered by acute CVA.  Acute metabolic encephalopathy Felt to be multifactorial (polypharmacy/seizures/CVA/delirium all contributing)-neuroimaging as above-unfortunately unable to obtain MRI-due to spinal stimulator Slowly improving-but not yet at baseline-maintain delirium precautions.  Acute CVA Workup as above Neurology recommending aspirin/Plavix for 3 weeks then Plavix alone Remains on statin (LDL 39)  Choledocholithiasis He seems asymptomatic-seen incidentally on CT abdomen on admission Patient is s/p cholecystectomy-discussed with patient's spouse on 7/31-he has had multiple ERCPs at [facility] (apparently 5 in total-last ERCP approximately 2 years back).  This seems to be more of a chronic issue-he has no evidence of cholangitis clinically-recent LFTs are stable-spoke with GI team-Jessica Zehr-PA-C-recommends we repeat LFTs tomorrow-she will discuss with her attending-to see if we need to do any intervention during his hospitalizations or could just follow-up with primary GI at [facility].  Furthermore he is on Plavix-that we will probably need to be held before ERCP can be done.  Hypoglycemia Follow CBGs closely-no recurrence over  the past day or so.  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  Hypothyroidism Synthroid  PAD Plavix  Debility/deconditioning Failure to thrive syndrome PT/OT eval Patient pulled out NG tube last night-will see if he can tolerate oral intake-was only on nocturnal feeds.  Nutrition Status: Nutrition Problem: Inadequate oral intake Etiology: inability to eat Signs/Symptoms: other (comment) (mental status) Interventions: Refer to RD note for recommendations, Education  BMI: Estimated body mass index is 29.53 kg/m as calculated from the following:   Height as of this encounter: 5\' 6"  (1.676 m).   Weight as of this encounter: 83 kg.   Code status:   Code Status: Full Code   DVT Prophylaxis: heparin injection 5,000 Units Start: 07/31/22 1400 SCDs Start: 07/30/22 0746   Family Communication: Spouse-[phone] -updated over the phone 7/31   Disposition Plan: Status is: Inpatient Remains inpatient appropriate because: Severity of illness   Planned Discharge Destination:Skilled nursing facility   Diet: Diet Order             DIET DYS 3 Fluid consistency: Thin  Diet effective now                     Antimicrobial agents: Anti-infectives (From admission, onward)    None        MEDICATIONS: Scheduled Meds:  aspirin EC  81 mg Oral Daily   bacitracin   Topical TID   Chlorhexidine Gluconate Cloth  6 each Topical Q0600   clopidogrel  75 mg Oral Daily   darbepoetin (ARANESP) injection - DIALYSIS  60 mcg Subcutaneous Q Thu-1800   divalproex  500 mg Oral Q12H   feeding supplement (NEPRO CARB STEADY)  237 mL  Oral BID BM   feeding supplement (OSMOLITE 1.5 CAL)  1,000 mL Per Tube Q24H   feeding supplement (PROSource TF20)  60 mL Per Tube BID   heparin injection (subcutaneous)  5,000 Units Subcutaneous Q8H   levothyroxine  250 mcg Oral Q0600   midodrine  10 mg Oral Q T,Th,Sa-HD   pantoprazole  40 mg Oral BID   QUEtiapine  25 mg Oral QHS   rosuvastatin  10 mg Oral QHS   sodium chloride flush  3 mL Intravenous Q12H   Continuous Infusions:  sodium chloride     PRN Meds:.sodium chloride, acetaminophen **OR** acetaminophen, albuterol, haloperidol lactate, ondansetron **OR** ondansetron (ZOFRAN) IV, sodium chloride flush   I have personally reviewed following labs and imaging studies  LABORATORY DATA: CBC: Recent Labs  Lab 08/01/22 0219 08/02/22 0605 08/03/22 0525 08/04/22 0429 08/05/22 0034 08/06/22 0154  WBC 7.0 6.6 6.3 6.1 6.4 7.0  NEUTROABS 5.3  --   --   --   --   --   HGB 9.7* 9.3* 9.3* 9.2* 10.1* 10.5*  HCT 30.8* 30.2* 30.7* 31.3* 33.3* 34.6*  MCV 94.2 94.4 96.2 96.3 94.3 96.6  PLT 133* 130* 138* 127* 139* 141*    Basic Metabolic Panel: Recent Labs  Lab 08/02/22 0606 08/02/22 1754 08/03/22 0525 08/03/22 0526 08/04/22 0429 08/05/22 0034 08/06/22  0154  NA 128*  --   --  132* 131* 132* 132*  K 3.9  --   --  4.1 4.9 5.9* 5.0  CL 93*  --   --  93* 93* 93* 92*  CO2 24  --   --  29 28 28 29   GLUCOSE 120*  --   --  147* 139* 133* 107*  BUN 29*  --   --  24* 43* 55* 32*  CREATININE 4.49*  --   --  3.11* 3.98* 4.69* 3.17*  CALCIUM 8.2*  --   --  8.5* 8.7* 9.1 8.9  MG  --  1.5* 1.5*  --  1.9 1.9 1.7  PHOS 4.5 2.1*  --  2.1* 2.5 2.5 2.3*    GFR: Estimated Creatinine Clearance: 21.9 mL/min (A) (by C-G formula based on SCr of 3.17 mg/dL (H)).  Liver Function Tests: Recent Labs  Lab 08/02/22 0606 08/03/22 0526 08/04/22 0429 08/05/22 0034 08/06/22 0154  ALBUMIN 2.1* 2.3* 2.2* 2.3* 2.4*   No results for input(s): "LIPASE", "AMYLASE" in the last 168 hours. No results  for input(s): "AMMONIA" in the last 168 hours.  Coagulation Profile: No results for input(s): "INR", "PROTIME" in the last 168 hours.  Cardiac Enzymes: No results for input(s): "CKTOTAL", "CKMB", "CKMBINDEX", "TROPONINI" in the last 168 hours.  BNP (last 3 results) No results for input(s): "PROBNP" in the last 8760 hours.  Lipid Profile: No results for input(s): "CHOL", "HDL", "LDLCALC", "TRIG", "CHOLHDL", "LDLDIRECT" in the last 72 hours.  Thyroid Function Tests: No results for input(s): "TSH", "T4TOTAL", "FREET4", "T3FREE", "THYROIDAB" in the last 72 hours.  Anemia Panel: No results for input(s): "VITAMINB12", "FOLATE", "FERRITIN", "TIBC", "IRON", "RETICCTPCT" in the last 72 hours.  Urine analysis:    Component Value Date/Time   COLORURINE STRAW (A) 06/27/2022 1351   APPEARANCEUR CLEAR 06/27/2022 1351   LABSPEC 1.006 06/27/2022 1351   PHURINE 9.0 (H) 06/27/2022 1351   GLUCOSEU 150 (A) 06/27/2022 1351   HGBUR LARGE (A) 06/27/2022 1351   BILIRUBINUR NEGATIVE 06/27/2022 1351   KETONESUR NEGATIVE 06/27/2022 1351   PROTEINUR 30 (A) 06/27/2022 1351   NITRITE NEGATIVE 06/27/2022 1351   LEUKOCYTESUR MODERATE (A) 06/27/2022 1351    Sepsis Labs: Lactic Acid, Venous    Component Value Date/Time   LATICACIDVEN 0.6 07/30/2022 0200    MICROBIOLOGY: Recent Results (from the past 240 hour(s))  SARS Coronavirus 2 by RT PCR (hospital order, performed in [facility] hospital lab) *cepheid single result test* Anterior Nasal Swab     Status: None   Collection Time: 07/30/22  1:30 AM   Specimen: Anterior Nasal Swab  Result Value Ref Range Status   SARS Coronavirus 2 by RT PCR NEGATIVE NEGATIVE Final    Comment: Performed at [facility], [address], [place], [place] [address]  MRSA Next Gen by PCR, Nasal     Status: None   Collection Time: 07/30/22  8:35 AM   Specimen: Nasal Mucosa; Nasal Swab  Result Value Ref Range Status   MRSA by PCR Next Gen NOT DETECTED NOT DETECTED  Final    Comment: (NOTE) The GeneXpert MRSA Assay (FDA approved for NASAL specimens only), is one component of a comprehensive MRSA colonization surveillance program. It is not intended to diagnose MRSA infection nor to guide or monitor treatment for MRSA infections. Test performance is not FDA approved in patients less than 64 years old. Performed at [facility], [address], [place], [place] [address]  Culture, blood (Routine X 2) w Reflex to ID Panel     Status: None   Collection Time: 07/30/22  4:02 PM   Specimen: BLOOD  Result Value Ref Range Status   Specimen Description BLOOD SITE NOT SPECIFIED  Final   Special Requests   Final    BOTTLES DRAWN AEROBIC AND ANAEROBIC Blood Culture adequate volume   Culture   Final    NO GROWTH 5 DAYS Performed at [facility], [address], [place], [place] [address]    Report Status 08/04/2022 FINAL  Final  Culture, blood (Routine X 2) w Reflex to ID Panel     Status: None   Collection Time: 07/30/22  4:02 PM   Specimen: BLOOD  Result Value Ref Range Status   Specimen Description BLOOD SITE NOT SPECIFIED  Final   Special Requests   Final    BOTTLES DRAWN AEROBIC AND ANAEROBIC Blood Culture adequate volume   Culture   Final    NO GROWTH 5 DAYS Performed at [facility], [address], [place], [place] [address]    Report Status 08/04/2022 FINAL  Final    RADIOLOGY STUDIES/RESULTS: No results found.   LOS: 7 days   Jeoffrey Massed, MD  [facility]    To contact the attending provider between 7A-7P or the covering provider during after hours 7P-7A, please log into the web site [email or web address] and access using universal [place] password for that web site. If you do not have the password, please call the hospital operator.  08/06/2022, 2:44 PM

## 2022-08-06 NOTE — TOC Progression Note (Signed)
Transition of Care [facility]) - Progression Note    Patient Details  Name: Chase [name] MRN: [id number] Date of Birth: [birth year]  Transition of Care [facility]) CM/SW Contact  Mearl Latin, LCSW Phone Number: 08/06/2022, 11:43 AM  Clinical Narrative:    CSW provided update to [place] and spouse.    Expected Discharge Plan: Skilled Nursing Facility Barriers to Discharge: Continued Medical Work up  Expected Discharge Plan and Services In-house Referral: Clinical Social Work   Post Acute Care Choice: Skilled Nursing Facility, Dialysis Living arrangements for the past 2 months: Skilled Nursing Facility                                       Social Determinants of Health (SDOH) Interventions SDOH Screenings   Food Insecurity: No Food Insecurity (07/16/2022)  Housing: Low Risk  (07/16/2022)  Transportation Needs: No Transportation Needs (07/16/2022)  Utilities: Not At Risk (07/16/2022)  Financial Resource Strain: Low Risk  (09/16/2021)   Received from [facility], [facility]  Physical Activity: Inactive (09/16/2021)   Received from [facility], [facility]  Social Connections: Socially Integrated (09/16/2021)   [facility] from [facility], [facility]  Stress: Patient Declined (09/16/2021)   Received from [facility], [facility]  Tobacco Use: Low Risk  (07/29/2022)    Readmission Risk Interventions    07/31/2022    3:30 PM 07/16/2022    3:22 PM  Readmission Risk Prevention Plan  Transportation Screening Complete Complete  PCP or Specialist Appt within 5-7 Days  Complete  Home Care Screening  Complete  Medication Review (RN CM)  Complete  Medication Review (RN Care Manager) Complete   PCP or Specialist appointment within 3-5 days of discharge Complete   HRI or Home Care Consult Complete   SW Recovery Care/Counseling Consult Complete   Palliative Care Screening Not Applicable   Skilled Nursing Facility Complete

## 2022-08-06 NOTE — Progress Notes (Signed)
Occupational Therapy Treatment Patient Details Name: Chase [name] MRN: [id number] DOB: March 19, 1950 Today's Date: 08/06/2022   History of present illness 72 y.o. male presenting to [facility] from dialysis 07/29/2022 with abdominal pain and confusion. 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 PMH includes back pain, depression, CAD, DM, CKD, HTN, SDH, ESRD.   OT comments  Pt with continued lethargy and decreased cognition, but with increased alertness and improved ability to participate in skilled OT session this day. Pt now demonstrates ability to complete UB ADLs with Min-Mod assist to Max assist, LB ADLs with Total assist +2 from bed level, and rolling in the bed during/in preparation for functional or therapeutic tasks with Total assist. Pt participated well in B UE AAROM/PROM exercises this day to increase B UE strength, B UE fine and gross motor coordination, and activity tolerance for carryover to ADLs, bed mobility, and functional transfers. Pt is making progress toward OT goals. Pt will benefit from continued acute skilled OT services to address deficits, decrease caregiver burden, and increase safety and independence with functional tasks. Post acute discharge, pt will benefit from intensive inpatient skilled rehab services < 3 hours per day to maximize rehab potential.   Recommendations for follow up therapy are one component of a multi-disciplinary discharge planning process, led by the attending physician.  Recommendations may be updated based on patient status, additional functional criteria and insurance authorization.    Assistance Recommended at Discharge Frequent or constant Supervision/Assistance  Patient can return home with the following  Two people to help with walking and/or transfers;Two people to help with bathing/dressing/bathroom;Assistance with cooking/housework;Assistance with feeding;Direct supervision/assist for medications management;Direct  supervision/assist for financial management;Assist for transportation;Help with stairs or ramp for entrance   Equipment Recommendations  Other (comment) (Defer to next level of care)    Recommendations for Other Services      Precautions / Restrictions Precautions Precautions: Fall Restrictions Weight Bearing Restrictions: Yes RUE Weight Bearing: Non weight bearing       Mobility Bed Mobility Overal bed mobility: Needs Assistance Bed Mobility: Rolling Rolling: Total assist              Transfers                         Balance                                           ADL either performed or assessed with clinical judgement   ADL Overall ADL's : Needs assistance/impaired Eating/Feeding: Minimal assistance;Moderate assistance;Bed level (with HOB elevated)   Grooming: Moderate assistance;Wash/dry hands;Wash/dry face;Cueing for sequencing;Bed level   Upper Body Bathing: Maximal assistance;Cueing for sequencing;Cueing for compensatory techniques;Bed level   Lower Body Bathing: Total assistance;+2 for physical assistance;+2 for safety/equipment;Bed level   Upper Body Dressing : Moderate assistance;Maximal assistance;Cueing for sequencing;Cueing for compensatory techniques;Bed level   Lower Body Dressing: Total assistance;+2 for physical assistance;+2 for safety/equipment;Bed level                      Extremity/Trunk Assessment Upper Extremity Assessment Upper Extremity Assessment: Generalized weakness;RUE deficits/detail;LUE deficits/detail RUE Deficits / Details: Recent amputation of R 2nd and 4th fingers. Generalized weakness. Impaired PROM/AAROM shoulder flexion to appox. 90 degrees. Pt with limited ability to participate in AAROM of R UE secondary to lethergy and fatigue. RUE Coordination:  decreased fine motor;decreased gross motor LUE Deficits / Details: Generalized weakness. Impaired PROM/AAROM shoulder flexion to approx.100  degrees. Pt with limited ability to participate in AAROM of R UE secondary to lethergy and fatigue. LUE Coordination: decreased fine motor;decreased gross motor   Lower Extremity Assessment Lower Extremity Assessment: Defer to PT evaluation        Vision       Perception     Praxis      Cognition Arousal/Alertness: Lethargic Behavior During Therapy: Flat affect Overall Cognitive Status: Difficult to assess                                 General Comments: Pt with increased alertness this session as compared to OT eval, but with continued lethergy and flat affect. Pt kept eyes open during approx. half of session this day. Pt demonstrated ability to give 1-3 words answers to simple questions with increased time and to following 1-step command in 7/10 attempts with increased time and occasional visial, verbal, or tactile cues.        Exercises Exercises: General Upper Extremity, Other exercises General Exercises - Upper Extremity Shoulder Flexion: AAROM, PROM, Both, 10 reps, Supine Shoulder Extension: AAROM, PROM, Both, 10 reps, Supine Shoulder ABduction: AAROM, PROM, Both, 10 reps, Supine Shoulder ADduction: AAROM, PROM, Both, 10 reps, Supine Elbow Flexion: AAROM, PROM, Both, 10 reps, Supine Elbow Extension: AAROM, PROM, Both, 10 reps, Supine Wrist Flexion: AAROM, PROM, Both, 10 reps, Supine Wrist Extension: AAROM, PROM, Both, 10 reps, Supine Composite Extension: AROM, Right, 5 reps, Left, 10 reps, Supine (with wrist supported) Other Exercises Other Exercises: Wrist ulnar/radial deviation; AAROM;PROM;Both;10 reps;Supine;    Shoulder Instructions       General Comments VSS on RA throughout session. OT left written B UE PROM/AAROM HEP in pt's room for pt's wife this session as a follow up from previous session in which OT trained pt's wife in safely performing B UE PROM/AAROM HEP with pt. Pt's wife not present this day. RN notified HEP was left and to contact OT  with any questions. OT will also follow up with pt's wife when she is present.    Pertinent Vitals/ Pain       Pain Assessment Pain Assessment: Faces Faces Pain Scale: Hurts a little bit Pain Location: Right sholder following AAROM Pain Descriptors / Indicators: Grimacing, Sore (Stiff) Pain Intervention(s): Limited activity within patient's tolerance, Monitored during session, Repositioned  Home Living                                          Prior Functioning/Environment              Frequency  Min 1X/week        Progress Toward Goals  OT Goals(current goals can now be found in the care plan section)  Progress towards OT goals: Progressing toward goals  Acute Rehab OT Goals Patient Stated Goal: Pt would like to see wife and go sailing again  Plan Discharge plan remains appropriate;Frequency remains appropriate    Co-evaluation                 AM-PAC OT "6 Clicks" Daily Activity     Outcome Measure   Help from another person eating meals?: A Lot Help from another person taking care of personal grooming?: A Lot  Help from another person toileting, which includes using toliet, bedpan, or urinal?: Total Help from another person bathing (including washing, rinsing, drying)?: A Lot Help from another person to put on and taking off regular upper body clothing?: A Lot Help from another person to put on and taking off regular lower body clothing?: Total 6 Click Score: 10    End of Session    OT Visit Diagnosis: Other abnormalities of gait and mobility (R26.89);Muscle weakness (generalized) (M62.81);Other (comment) (decreased activity tolerance)   Activity Tolerance Patient tolerated treatment well;Patient limited by fatigue;Patient limited by lethargy   Patient Left in bed;with call bell/phone within reach;with bed alarm set   Nurse Communication Mobility status;Other (comment) (Pt with increased alertness this session; B UE AAROM/PROM HEP  left in room for pt's wife as follow up to earlier training, please contact OT with any questions regarding B UE HEP)        Time: 8469-6295 OT Time Calculation (min): 27 min  Charges: OT General Charges $OT Visit: 1 Visit OT Treatments $Self Care/Home Management : 8-22 mins $Therapeutic Exercise: 8-22 mins  Aaleah Hirsch "Orson Eva., OTR/L, MA Acute Rehab [phone]   Lendon Colonel 08/06/2022, 5:26 PM

## 2022-08-07 DIAGNOSIS — G9340 Encephalopathy, unspecified: Secondary | ICD-10-CM | POA: Diagnosis not present

## 2022-08-07 DIAGNOSIS — R569 Unspecified convulsions: Secondary | ICD-10-CM | POA: Diagnosis not present

## 2022-08-07 DIAGNOSIS — I96 Gangrene, not elsewhere classified: Secondary | ICD-10-CM | POA: Diagnosis not present

## 2022-08-07 DIAGNOSIS — N186 End stage renal disease: Secondary | ICD-10-CM | POA: Diagnosis not present

## 2022-08-07 LAB — GLUCOSE, CAPILLARY
Glucose-Capillary: 75 mg/dL (ref 70–99)
Glucose-Capillary: 75 mg/dL (ref 70–99)
Glucose-Capillary: 121 mg/dL — ABNORMAL HIGH (ref 70–99)
Glucose-Capillary: 130 mg/dL — ABNORMAL HIGH (ref 70–99)
Glucose-Capillary: 164 mg/dL — ABNORMAL HIGH (ref 70–99)
Glucose-Capillary: 113 mg/dL — ABNORMAL HIGH (ref 70–99)

## 2022-08-07 MED ORDER — PROSOURCE PLUS PO LIQD
30.0000 mL | Freq: Two times a day (BID) | ORAL | Status: DC
  Administered 2022-08-08 – 2022-08-10 (×6): 30 mL via ORAL
  Filled 2022-08-07 (×7): qty 30

## 2022-08-07 MED ORDER — NEPRO/CARBSTEADY PO LIQD
237.0000 mL | Freq: Three times a day (TID) | ORAL | Status: DC
  Administered 2022-08-07 – 2022-08-10 (×11): 237 mL via ORAL

## 2022-08-07 NOTE — Progress Notes (Signed)
Subjective:  On HD In Recliner and tolerating , somewhat  lethargic and awoken from sleep oriented  to [facility] only , no sob  or co s  Objective Vital signs in last 24 hours: Vitals:   08/07/22 0737 08/07/22 0925 08/07/22 0953 08/07/22 1040  BP: (!) 144/42 (!) 140/71 (!) 131/52 (!) 134/48  Pulse: 65 64 63 64  Resp: 18 (!) 22 18 18   Temp:  97.7 F (36.5 C)    TempSrc:      SpO2: 95% 100% 100% 100%  Weight:      Height:       Weight change: -5.5 kg  Physical Exam: General: Alert, pleasant this am / Cortrak  present ,Chronically ill appearing  Elderly Male , NAD  Heart: RRR, No MRG Lungs: CTA  bilat, nonlabored breathtaking  Abdomen: NABS, soft, NT, ND  Extremities: no pedal edema / R HAND  partial 2nd/ 4th fingers    Dialysis Access:  LFA AVF patent on hd   Dialysis Orders: TTS at [facility] 3.5hr, 350/700, EDW 99kg, 2K, AVF, heparin 3700 bolus + 500U/hr pump - HD stopped 7/23 due to acute agitation, had been well prior to HD on 7/23 - Epogen 7600 units q HD   Assessment/Plan: 1. AMS/small pontine CVA:  Occurred Acutely during dialysis 7/23 - no clear etiology initially. No new meds. [facility] 7/24 negative. Does have baseline memory loss and occ confusion per wife, but this is much worse. Initial head CT negative except chronic brain atrophy, repeat 7/26 showed possible lesion in pons - unable to have MRI d/t spinal stimulator -> repeat CT 7/27 showed the same, felt to be small pontine CVA. Had seizure-like activity on 7/24 - started on Keppra. EEG c/w diffuse metabolic encephalopathy. Has chronic back pain with spinal stimulator and on chronic narcotics - on hold. NG tube was inserted. Now  has Cortak  for Nocturnal tube feeds / Neuro following. Some improvement but does not appear back to baseline. Today 1st  HD in Recliner  2. ESRD: on HD TTS - has not missed HD, BUN low - not uremic. Symptoms not improved with HD. HD in  Recliner tolerating today so far  3. HTN/volume: BP ok, volume  looks ok.  UOP good, tolerating some UF with HD. Using midodrine pre-HD. 4. Anemia: Hgb 10.5  - continue Aranesp weekly while here. 5. Secondary hyperparathyroidism:  Ca Corec 10.2 , Phos low 2.3 < 2.5 <2.1  binder held 7/28 - .Ck PTH level hold vit d  6. Nutrition:Alb low  2.4-  now has NG tube, although looks that has passed swallow test - possibly restarting diet 7. 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   Chase Pastel, PA-C [facility] Beeper [phone] 08/07/2022,10:53 AM  LOS: 8 days   Labs: Basic Metabolic Panel: Recent Labs  Lab 08/04/22 0429 08/05/22 0034 08/06/22 0154 08/07/22 0610  NA 131* 132* 132* 130*  K 4.9 5.9* 5.0 5.0  CL 93* 93* 92* 91*  CO2 28 28 29 27   GLUCOSE 139* 133* 107* 81  BUN 43* 55* 32* 45*  CREATININE 3.98* 4.69* 3.17* 4.45*  CALCIUM 8.7* 9.1 8.9 9.2  PHOS 2.5 2.5 2.3*  --    Liver Function Tests: Recent Labs  Lab 08/05/22 0034 08/06/22 0154 08/07/22 0610  AST  --   --  10*  ALT  --   --  7  ALKPHOS  --   --  112  BILITOT  --   --  0.6  PROT  --   --  6.4*  ALBUMIN 2.3* 2.4* 2.6*   No results for input(s): "LIPASE", "AMYLASE" in the last 168 hours. No results for input(s): "AMMONIA" in the last 168 hours. CBC: Recent Labs  Lab 08/01/22 0219 08/02/22 0605 08/03/22 0525 08/04/22 0429 08/05/22 0034 08/06/22 0154 08/07/22 0610  WBC 7.0   < > 6.3 6.1 6.4 7.0 7.1  NEUTROABS 5.3  --   --   --   --   --  4.8  HGB 9.7*   < > 9.3* 9.2* 10.1* 10.5* 10.5*  HCT 30.8*   < > 30.7* 31.3* 33.3* 34.6* 34.5*  MCV 94.2   < > 96.2 96.3 94.3 96.6 94.5  PLT 133*   < > 138* 127* 139* 141* 157   < > = values in this interval not displayed.   Cardiac Enzymes: No results for input(s): "CKTOTAL", "CKMB", "CKMBINDEX", "TROPONINI" in the last 168 hours. CBG: Recent Labs  Lab 08/06/22 1906 08/06/22 2326 08/07/22 0409 08/07/22 0714 08/07/22 1044  GLUCAP 96 83 75 75 121*    Studies/Results: No results  found. Medications:  sodium chloride      aspirin EC  81 mg Oral Daily   bacitracin   Topical TID   Chlorhexidine Gluconate Cloth  6 each Topical Q0600   clopidogrel  75 mg Oral Daily   darbepoetin (ARANESP) injection - DIALYSIS  60 mcg Subcutaneous Q Thu-1800   divalproex  500 mg Oral Q12H   feeding supplement (NEPRO CARB STEADY)  237 mL Oral TID BM   feeding supplement (PROSource TF20)  60 mL Per Tube BID   heparin injection (subcutaneous)  5,000 Units Subcutaneous Q8H   levothyroxine  250 mcg Oral Q0600   midodrine  10 mg Oral Q T,Th,Sa-HD   pantoprazole  40 mg Oral BID   QUEtiapine  25 mg Oral QHS   rosuvastatin  10 mg Oral QHS   sodium chloride flush  3 mL Intravenous Q12H

## 2022-08-07 NOTE — Plan of Care (Signed)

## 2022-08-07 NOTE — Progress Notes (Signed)
PT Cancellation Note  Patient Details Name: Chase [name] MRN: [id number] DOB: [birth year]   Cancelled Treatment:    Reason Eval/Treat Not Completed: (P) Patient at procedure or test/unavailable, pt off unit at HD. Will check back as schedule allows to continue with PT POC.  Lenora Boys. PTA Acute Rehabilitation Services Office: [phone]    Catalina Antigua 08/07/2022, 10:17 AM

## 2022-08-07 NOTE — Progress Notes (Addendum)
Patient admitted the last 2 weeks for seizures, acute CVA, recent had gangrene of the finger that required amputation.  He has end-stage renal disease on dialysis.  Noted to have CBD stones without obstruction on imaging.  LFTs are normal.  He has had this issue recurrently/chronically and has had multiple ERCPs at other facilities, the last a couple of years ago at which time they were apparently told that sphincterotomy was wide open and should be draining well.  We are asked to review just to make sure nothing needed done currently.  With normal LFTs and no sign of acute obstruction should have his other acute issues addressed and he can follow-up as an outpatient with his regular GI for further management.  GI ATTENDING  Asked to review the data, as the biliary attending.  Agree with the above impression and recommendations.  Wilhemina Bonito. Eda Keys., M.D. [facility] Division of Gastroenterology

## 2022-08-07 NOTE — Progress Notes (Signed)
Received patient in HD recliner.Alert to self.Safety sitter at the chairside.Consent verified.  Access used : Left lower arm AVF that worked well.  Duration of treatment : 3.24 hours.  Fluid removed : 1,200 cc.  Hemo comment:Several episodes of hypotension thus 1.2 liter net UF.On his last 3 minutes of his treatment circuit clogged.Patient was restless at times,moving his left arm frequently that made the HD machine beeps a lot ,thus clogging the circuit.Sitter at the chairside,but patient has short attention focus.  Hand off to the patient's nurse

## 2022-08-07 NOTE — Progress Notes (Signed)
PROGRESS NOTE        PATIENT DETAILS Name: Chase [name] Age: 72 y.o. Sex: male Date of Birth: 02/11/1950 Admit Date: 07/29/2022 Admitting Physician Joseph Art, DO VWU:[id number] Alinda Dooms, MD  Brief Summary: Patient is a 72 y.o.  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  See below for further details.  Significant events: 7/23>> admit to [facility] 7/24>> seizure-like activity-neuro consult.  Significant studies: 7/23>> CT head: No acute intracranial abnormality 7/23>> CT abdomen/pelvis: Stable intra/extrahepatic biliary ductal dilatation-with hyperdense material in the CBD-compatible with choledocholithiasis.  Left lower abdominal wall intramuscular hematoma has decreased in size. 7/23>> RUQ ultrasound: Cholecystectomy-otherwise unremarkable RUQ ultrasound. 7/24>> CT head: No acute finding. 7/24-7/25>> LTM EEG: No definite seizures were seen. 7/26>> CT head: Focal hypodense lesion in the left hemipons-possible age-indeterminate CVA 7/26>> echo: EF 60-65% 7/27>> CT head: Redemonstrated hypoattenuation in the left pons consistent with age-indeterminate infarct.  Significant microbiology data: 7/24>> blood culture: No growth 7/24>> COVID PCR: Negative  Procedures: None   Consults: Neurology Nephrology Phone consult-gastroenterology  Subjective: More awake compared to yesterday-still somewhat confused and needs reorientation to answer some questions.  But much more fluent.  No family at bedside.  Objective: Vitals: Blood pressure (!) 144/42, pulse 65, temperature (!) 96.5 F (35.8 C), temperature source Axillary, resp. rate 18, height 5\' 6"  (1.676 m), weight 80 kg, SpO2 95%.   Exam: Gen Exam:Alert awake-not in any  distress HEENT:atraumatic, normocephalic Chest: B/L clear to auscultation anteriorly CVS:S1S2 regular Abdomen:soft non tender, non distended Extremities:no edema Neurology: Non focal-moves all 4 extremities. Skin: no rash  Pertinent Labs/Radiology:    Latest Ref Rng & Units 08/07/2022    6:10 AM 08/06/2022    1:54 AM 08/05/2022   12:34 AM  CBC  WBC 4.0 - 10.5 K/uL 7.1  7.0  6.4   Hemoglobin 13.0 - 17.0 g/dL 14.7  82.9  56.2   Hematocrit 39.0 - 52.0 % 34.5  34.6  33.3   Platelets 150 - 400 K/uL 157  141  139     Lab Results  Component Value Date   NA 130 (L) 08/07/2022   K 5.0 08/07/2022   CL 91 (L) 08/07/2022   CO2 27 08/07/2022      Assessment/Plan: Seizure disorder Found to have seizure-like activity on 7/24 Neuroconsulted-on Depakote Perhaps triggered by acute CVA. Thankfully no further seizures for the past several days. Maintain seizure precautions  Acute metabolic encephalopathy Felt to be multifactorial (polypharmacy/seizures/CVA/delirium all contributing)-neuroimaging as above-unfortunately unable to obtain MRI-due to spinal stimulator Continues to slowly improve-although improved not yet at baseline. Continue delirium precautions  Acute CVA Workup as above Neurology recommending aspirin/Plavix for 3 weeks then Plavix alone Remains on statin (LDL 39)  Choledocholithiasis Patient is s/p cholecystectomy-and has had numerous choledocholithiasis requiring numerous ERCP in the past (x 5 total-last 2 years back) His CT abdomen on admission shows choledocholithiasis-thankfully patient is asymptomatic-LFTs are stable.  Spoke with GI team yesterday-they will review chart and provide further recommendations.  Reasonable to have patient follow-up with his primary GI MD at [facility] he is currently asymptomatic.  Furthermore he is on Plavix for his acute CVA that will  need to be held before ERCP can be pursued.  Hypoglycemia Borderline hypoglycemic  this morning but asymptomatic-encourage oral intake.  Follow CBGs  CBG (last 3)  Recent Labs    08/06/22 2326 08/07/22 0409 08/07/22 0714  GLUCAP 83 75 75     Dry gangrene right ring/index finger-s/p amputation on 7/11 Outpatient follow-up with hand surgery Amputation site appears benign  ESRD on HD TTS Nephrology following Previously was on PD-PD catheter was removed on 6/24  Recent history of abdominal wall hematoma at the site of PD catheter removal CT abdomen on admission shows continued evolution of hematoma-which has now decreased in size.  Hypothyroidism Synthroid  PAD Plavix  Debility/deconditioning Failure to thrive syndrome PT/OT eval Patient pulled out NG tube 7/30-claims he eating a decent amount of food orally-continue to watch closely.    Nutrition Status: Nutrition Problem: Inadequate oral intake Etiology: inability to eat Signs/Symptoms: other (comment) (mental status) Interventions: Refer to RD note for recommendations, Education  BMI: Estimated body mass index is 28.47 kg/m as calculated from the following:   Height as of this encounter: 5\' 6"  (1.676 m).   Weight as of this encounter: 80 kg.   Code status:   Code Status: Full Code   DVT Prophylaxis: heparin injection 5,000 Units Start: 07/31/22 1400 SCDs Start: 07/30/22 0746   Family Communication: Spouse-[phone] -updated over the phone 7/31   Disposition Plan: Status is: Inpatient Remains inpatient appropriate because: Severity of illness   Planned Discharge Destination:Skilled nursing facility   Diet: Diet Order             DIET DYS 3 Fluid consistency: Thin  Diet effective now                     Antimicrobial agents: Anti-infectives (From admission, onward)    None        MEDICATIONS: Scheduled Meds:  aspirin EC  81 mg Oral Daily   bacitracin   Topical TID   Chlorhexidine Gluconate Cloth  6 each Topical Q0600   clopidogrel  75 mg Oral Daily    darbepoetin (ARANESP) injection - DIALYSIS  60 mcg Subcutaneous Q Thu-1800   divalproex  500 mg Oral Q12H   feeding supplement (NEPRO CARB STEADY)  237 mL Oral TID BM   feeding supplement (PROSource TF20)  60 mL Per Tube BID   heparin injection (subcutaneous)  5,000 Units Subcutaneous Q8H   levothyroxine  250 mcg Oral Q0600   midodrine  10 mg Oral Q T,Th,Sa-HD   pantoprazole  40 mg Oral BID   QUEtiapine  25 mg Oral QHS   rosuvastatin  10 mg Oral QHS   sodium chloride flush  3 mL Intravenous Q12H   Continuous Infusions:  sodium chloride     PRN Meds:.sodium chloride, acetaminophen **OR** acetaminophen, albuterol, haloperidol lactate, ondansetron **OR** ondansetron (ZOFRAN) IV, sodium chloride flush   I have personally reviewed following labs and imaging studies  LABORATORY DATA: CBC: Recent Labs  Lab 08/01/22 0219 08/02/22 0605 08/03/22 0525 08/04/22 0429 08/05/22 0034 08/06/22 0154 08/07/22 0610  WBC 7.0   < > 6.3 6.1 6.4 7.0 7.1  NEUTROABS 5.3  --   --   --   --   --  4.8  HGB 9.7*   < > 9.3* 9.2* 10.1* 10.5* 10.5*  HCT 30.8*   < > 30.7* 31.3* 33.3* 34.6* 34.5*  MCV 94.2   < > 96.2 96.3 94.3 96.6 94.5  PLT 133*   < >  138* 127* 139* 141* 157   < > = values in this interval not displayed.    Basic Metabolic Panel: Recent Labs  Lab 08/02/22 1754 08/03/22 0525 08/03/22 0526 08/04/22 0429 08/05/22 0034 08/06/22 0154 08/07/22 0610  NA  --   --  132* 131* 132* 132* 130*  K  --   --  4.1 4.9 5.9* 5.0 5.0  CL  --   --  93* 93* 93* 92* 91*  CO2  --   --  29 28 28 29 27   GLUCOSE  --   --  147* 139* 133* 107* 81  BUN  --   --  24* 43* 55* 32* 45*  CREATININE  --   --  3.11* 3.98* 4.69* 3.17* 4.45*  CALCIUM  --   --  8.5* 8.7* 9.1 8.9 9.2  MG 1.5* 1.5*  --  1.9 1.9 1.7  --   PHOS 2.1*  --  2.1* 2.5 2.5 2.3*  --     GFR: Estimated Creatinine Clearance: 15.4 mL/min (A) (by C-G formula based on SCr of 4.45 mg/dL (H)).  Liver Function Tests: Recent Labs  Lab  08/03/22 0526 08/04/22 0429 08/05/22 0034 08/06/22 0154 08/07/22 0610  AST  --   --   --   --  10*  ALT  --   --   --   --  7  ALKPHOS  --   --   --   --  112  BILITOT  --   --   --   --  0.6  PROT  --   --   --   --  6.4*  ALBUMIN 2.3* 2.2* 2.3* 2.4* 2.6*   No results for input(s): "LIPASE", "AMYLASE" in the last 168 hours. No results for input(s): "AMMONIA" in the last 168 hours.  Coagulation Profile: No results for input(s): "INR", "PROTIME" in the last 168 hours.  Cardiac Enzymes: No results for input(s): "CKTOTAL", "CKMB", "CKMBINDEX", "TROPONINI" in the last 168 hours.  BNP (last 3 results) No results for input(s): "PROBNP" in the last 8760 hours.  Lipid Profile: No results for input(s): "CHOL", "HDL", "LDLCALC", "TRIG", "CHOLHDL", "LDLDIRECT" in the last 72 hours.  Thyroid Function Tests: No results for input(s): "TSH", "T4TOTAL", "FREET4", "T3FREE", "THYROIDAB" in the last 72 hours.  Anemia Panel: No results for input(s): "VITAMINB12", "FOLATE", "FERRITIN", "TIBC", "IRON", "RETICCTPCT" in the last 72 hours.  Urine analysis:    Component Value Date/Time   COLORURINE STRAW (A) 06/27/2022 1351   APPEARANCEUR CLEAR 06/27/2022 1351   LABSPEC 1.006 06/27/2022 1351   PHURINE 9.0 (H) 06/27/2022 1351   GLUCOSEU 150 (A) 06/27/2022 1351   HGBUR LARGE (A) 06/27/2022 1351   BILIRUBINUR NEGATIVE 06/27/2022 1351   KETONESUR NEGATIVE 06/27/2022 1351   PROTEINUR 30 (A) 06/27/2022 1351   NITRITE NEGATIVE 06/27/2022 1351   LEUKOCYTESUR MODERATE (A) 06/27/2022 1351    Sepsis Labs: Lactic Acid, Venous    Component Value Date/Time   LATICACIDVEN 0.6 07/30/2022 0200    MICROBIOLOGY: Recent Results (from the past 240 hour(s))  SARS Coronavirus 2 by RT PCR (hospital order, performed in [facility] hospital lab) *cepheid single result test* Anterior Nasal Swab     Status: None   Collection Time: 07/30/22  1:30 AM   Specimen: Anterior Nasal Swab  Result Value Ref Range  Status   SARS Coronavirus 2 by RT PCR NEGATIVE NEGATIVE Final    Comment: Performed at [facility], [address] [place],  [place] [address]  MRSA Next Gen by PCR, Nasal     Status: None   Collection Time: 07/30/22  8:35 AM   Specimen: Nasal Mucosa; Nasal Swab  Result Value Ref Range Status   MRSA by PCR Next Gen NOT DETECTED NOT DETECTED Final    Comment: (NOTE) The GeneXpert MRSA Assay (FDA approved for NASAL specimens only), is one component of a comprehensive MRSA colonization surveillance program. It is not intended to diagnose MRSA infection nor to guide or monitor treatment for MRSA infections. Test performance is not FDA approved in patients less than 90 years old. Performed at [facility], [address] [place], [place] [address]   Culture, blood (Routine X 2) w Reflex to ID Panel     Status: None   Collection Time: 07/30/22  4:02 PM   Specimen: BLOOD  Result Value Ref Range Status   Specimen Description BLOOD SITE NOT SPECIFIED  Final   Special Requests   Final    BOTTLES DRAWN AEROBIC AND ANAEROBIC Blood Culture adequate volume   Culture   Final    NO GROWTH 5 DAYS Performed at [facility], [address], [place], [place] [address]    Report Status 08/04/2022 FINAL  Final  Culture, blood (Routine X 2) w Reflex to ID Panel     Status: None   Collection Time: 07/30/22  4:02 PM   Specimen: BLOOD  Result Value Ref Range Status   Specimen Description BLOOD SITE NOT SPECIFIED  Final   Special Requests   Final    BOTTLES DRAWN AEROBIC AND ANAEROBIC Blood Culture adequate volume   Culture   Final    NO GROWTH 5 DAYS Performed at [facility], [address], [place], [place] [address]    Report Status 08/04/2022 FINAL  Final    RADIOLOGY STUDIES/RESULTS: No results found.   LOS: 8 days   Jeoffrey Massed, MD  [facility]    To contact the attending provider between 7A-7P or the covering provider during after hours 7P-7A,  please log into the web site [email or web address] and access using universal Jay password for that web site. If you do not have the password, please call the hospital operator.  08/07/2022, 9:53 AM

## 2022-08-08 DIAGNOSIS — G9340 Encephalopathy, unspecified: Secondary | ICD-10-CM | POA: Diagnosis not present

## 2022-08-08 DIAGNOSIS — R569 Unspecified convulsions: Secondary | ICD-10-CM | POA: Diagnosis not present

## 2022-08-08 DIAGNOSIS — I96 Gangrene, not elsewhere classified: Secondary | ICD-10-CM | POA: Diagnosis not present

## 2022-08-08 DIAGNOSIS — N186 End stage renal disease: Secondary | ICD-10-CM | POA: Diagnosis not present

## 2022-08-08 LAB — GLUCOSE, CAPILLARY
Glucose-Capillary: 70 mg/dL (ref 70–99)
Glucose-Capillary: 81 mg/dL (ref 70–99)
Glucose-Capillary: 84 mg/dL (ref 70–99)
Glucose-Capillary: 86 mg/dL (ref 70–99)
Glucose-Capillary: 91 mg/dL (ref 70–99)
Glucose-Capillary: 92 mg/dL (ref 70–99)

## 2022-08-08 MED ORDER — HALOPERIDOL 1 MG PO TABS
1.0000 mg | ORAL_TABLET | Freq: Four times a day (QID) | ORAL | Status: DC | PRN
  Administered 2022-08-09: 1 mg via ORAL
  Filled 2022-08-08 (×3): qty 1

## 2022-08-08 MED ORDER — HALOPERIDOL LACTATE 5 MG/ML IJ SOLN
1.0000 mg | Freq: Four times a day (QID) | INTRAMUSCULAR | Status: DC | PRN
  Administered 2022-08-08 – 2022-08-11 (×6): 1 mg via INTRAMUSCULAR
  Filled 2022-08-08 (×7): qty 1

## 2022-08-08 NOTE — TOC Progression Note (Signed)
Transition of Care [facility]) - Progression Note    Patient Details  Name: Chase [name] MRN: [id number] Date of Birth: Jan 14, 1950  Transition of Care [facility]) CM/SW Contact  Delilah Shan, LCSWA Phone Number: 08/08/2022, 11:37 AM  Clinical Narrative:      CSW continues to follow. Plan for patient to return to [facility] place when medically stable.   Expected Discharge Plan: Skilled Nursing Facility Barriers to Discharge: Continued Medical Work up  Expected Discharge Plan and Services In-house Referral: Clinical Social Work   Post Acute Care Choice: Skilled Nursing Facility, Dialysis Living arrangements for the past 2 months: Skilled Nursing Facility                                       Social Determinants of Health (SDOH) Interventions SDOH Screenings   Food Insecurity: No Food Insecurity (07/16/2022)  Housing: Low Risk  (07/16/2022)  Transportation Needs: No Transportation Needs (07/16/2022)  Utilities: Not At Risk (07/16/2022)  Financial Resource Strain: Low Risk  (09/16/2021)   Received from [facility]  Physical Activity: Inactive (09/16/2021)   Received from [facility], [facility]  Social Connections: Socially Integrated (09/16/2021)   Received from [facility]  Stress: Patient Declined (09/16/2021)   Received from [facility], [facility]  Tobacco Use: Low Risk  (07/29/2022)    Readmission Risk Interventions    07/31/2022    3:30 PM 07/16/2022    3:22 PM  Readmission Risk Prevention Plan  Transportation Screening Complete Complete  PCP or Specialist Appt within 5-7 Days  Complete  Home Care Screening  Complete  Medication Review (RN CM)  Complete  Medication Review (RN Care Manager) Complete   PCP or Specialist appointment within 3-5 days of discharge Complete   HRI or Home Care Consult Complete   SW Recovery Care/Counseling Consult Complete   Palliative Care Screening Not Applicable   Skilled Nursing Facility  Complete

## 2022-08-08 NOTE — Progress Notes (Signed)
PROGRESS NOTE        PATIENT DETAILS Name: Chase [name] Age: 72 y.o. Sex: male Date of Birth: 10-Oct-1950 Admit Date: 07/29/2022 Admitting Physician Joseph Art, DO ZOX:[id number] Alinda Dooms, MD  Brief Summary: Patient is a 72 y.o.  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  Evaluated by stroke MD-started on DAPT-unfortunately hospital course complicated by delirium/confusion.  See below for further details.  Significant events: 7/23>> admit to [facility] 7/24>> seizure-like activity-neuro consult.  Significant studies: 7/23>> CT head: No acute intracranial abnormality 7/23>> CT abdomen/pelvis: Stable intra/extrahepatic biliary ductal dilatation-with hyperdense material in the CBD-compatible with choledocholithiasis.  Left lower abdominal wall intramuscular hematoma has decreased in size. 7/23>> RUQ ultrasound: Cholecystectomy-otherwise unremarkable RUQ ultrasound. 7/24>> CT head: No acute finding. 7/24-7/25>> LTM EEG: No definite seizures were seen. 7/26>> CT head: Focal hypodense lesion in the left hemipons-possible age-indeterminate CVA 7/26>> echo: EF 60-65% 7/27>> CT head: Redemonstrated hypoattenuation in the left pons consistent with age-indeterminate infarct.  Significant microbiology data: 7/24>> blood culture: No growth 7/24>> COVID PCR: Negative  Procedures: None   Consults: Neurology Nephrology Phone consult-gastroenterology  Subjective: Answers some questions appropriately-needs some redirection.  Apparently was very restless with HD yesterday.  Still confused and not yet at baseline-spouse at bedside.  Objective: Vitals: Blood pressure (!) 121/48, pulse 72, temperature (!) 97.5 F (36.4 C), temperature source Oral, resp. rate  (!) 21, height 5\' 6"  (1.676 m), weight 83 kg, SpO2 95%.   Exam: Gen Exam:Alert awake-not in any distress HEENT:atraumatic, normocephalic Chest: B/L clear to auscultation anteriorly CVS:S1S2 regular Abdomen:soft non tender, non distended Extremities:no edema Neurology: Non focal Skin: no rash  Pertinent Labs/Radiology:    Latest Ref Rng & Units 08/07/2022    6:10 AM 08/06/2022    1:54 AM 08/05/2022   12:34 AM  CBC  WBC 4.0 - 10.5 K/uL 7.1  7.0  6.4   Hemoglobin 13.0 - 17.0 g/dL 54.0  98.1  19.1   Hematocrit 39.0 - 52.0 % 34.5  34.6  33.3   Platelets 150 - 400 K/uL 157  141  139     Lab Results  Component Value Date   NA 130 (L) 08/07/2022   K 5.0 08/07/2022   CL 91 (L) 08/07/2022   CO2 27 08/07/2022      Assessment/Plan: Seizure disorder Found to have seizure-like activity on 7/24 Neuro consulted-on Depakote Perhaps triggered by acute CVA. Thankfully no further seizures for the past several days. Maintain seizure precautions  Acute metabolic encephalopathy Felt to be multifactorial (polypharmacy/seizures/CVA/delirium all contributing)-neuroimaging as above-unfortunately unable to obtain MRI-due to spinal stimulator Still confused but better than what he was several days ago-however improvement seems to have plateaued over the past several days.  Per spouse-patient did have some cognitive issues even prior to this hospitalization-and hence he may just be having some hospital delirium as well.  But will check ammonia level-as he is on Depakote. Recent TSH/B12/RPR overall stable. Maintain delirium precautions  Acute CVA Workup as above Neurology recommending aspirin/Plavix for 3 weeks then Plavix alone Remains on statin (LDL 39)  Choledocholithiasis Patient is s/p cholecystectomy-and has had numerous choledocholithiasis requiring numerous ERCP in the past (x 5  total-last 2 years back) His CT abdomen on admission shows choledocholithiasis-thankfully patient is  asymptomatic-LFTs are stable.  Appreciate brief GI note on 8/1-no reason to pursue inpatient ERCP-recommendations are to follow-up with primary GI MD at [facility].    Hypoglycemia Borderline hypoglycemic this morning but asymptomatic-continue to encourage oral intake.    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  Hypothyroidism Synthroid  PAD Plavix  Debility/deconditioning Failure to thrive syndrome PT/OT eval Patient pulled out NG tube 7/30-claims he eating a decent amount of food orally-continue to watch closely.    Nutrition Status: Nutrition Problem: Inadequate oral intake Etiology: inability to eat Signs/Symptoms: other (comment) (mental status) Interventions: Refer to RD note for recommendations, Education  BMI: Estimated body mass index is 29.53 kg/m as calculated from the following:   Height as of this encounter: 5\' 6"  (1.676 m).   Weight as of this encounter: 83 kg.   Code status:   Code Status: Full Code   DVT Prophylaxis: heparin injection 5,000 Units Start: 07/31/22 1400 SCDs Start: 07/30/22 0746   Family Communication: Spouse-[phone] -updated at bedside on 8/2.   Disposition Plan: Status is: Inpatient Remains inpatient appropriate because: Severity of illness   Planned Discharge Destination:Skilled nursing facility   Diet: Diet Order             DIET DYS 3 Fluid consistency: Thin  Diet effective now                     Antimicrobial agents: Anti-infectives (From  admission, onward)    None        MEDICATIONS: Scheduled Meds:  (feeding supplement) PROSource Plus  30 mL Oral BID   aspirin EC  81 mg Oral Daily   bacitracin   Topical TID   Chlorhexidine Gluconate Cloth  6 each Topical Q0600   clopidogrel  75 mg Oral Daily   darbepoetin (ARANESP) injection - DIALYSIS  60 mcg Subcutaneous Q Thu-1800   divalproex  500 mg Oral Q12H   feeding supplement (NEPRO CARB STEADY)  237 mL Oral TID BM   heparin injection (subcutaneous)  5,000 Units Subcutaneous Q8H   levothyroxine  250 mcg Oral Q0600   midodrine  10 mg Oral Q T,Th,Sa-HD   pantoprazole  40 mg Oral BID   QUEtiapine  25 mg Oral QHS   rosuvastatin  10 mg Oral QHS   sodium chloride flush  3 mL Intravenous Q12H   Continuous Infusions:  sodium chloride     PRN Meds:.sodium chloride, acetaminophen **OR** acetaminophen, albuterol, haloperidol lactate, ondansetron **OR** ondansetron (ZOFRAN) IV, sodium chloride flush   I have personally reviewed following labs and imaging studies  LABORATORY DATA: CBC: Recent Labs  Lab 08/03/22 0525 08/04/22 0429 08/05/22 0034 08/06/22 0154 08/07/22 0610  WBC 6.3 6.1 6.4 7.0 7.1  NEUTROABS  --   --   --   --  4.8  HGB 9.3* 9.2* 10.1* 10.5* 10.5*  HCT 30.7* 31.3* 33.3* 34.6* 34.5*  MCV 96.2 96.3 94.3 96.6 94.5  PLT  138* 127* 139* 141* 157    Basic Metabolic Panel: Recent Labs  Lab 08/02/22 1754 08/03/22 0525 08/03/22 0526 08/04/22 0429 08/05/22 0034 08/06/22 0154 08/07/22 0610  NA  --   --  132* 131* 132* 132* 130*  K  --   --  4.1 4.9 5.9* 5.0 5.0  CL  --   --  93* 93* 93* 92* 91*  CO2  --   --  29 28 28 29 27   GLUCOSE  --   --  147* 139* 133* 107* 81  BUN  --   --  24* 43* 55* 32* 45*  CREATININE  --   --  3.11* 3.98* 4.69* 3.17* 4.45*  CALCIUM  --   --  8.5* 8.7* 9.1 8.9 9.2  MG 1.5* 1.5*  --  1.9 1.9 1.7  --   PHOS 2.1*  --  2.1* 2.5 2.5 2.3*  --     GFR: Estimated Creatinine Clearance: 15.6 mL/min (A) (by C-G formula based on  SCr of 4.45 mg/dL (H)).  Liver Function Tests: Recent Labs  Lab 08/03/22 0526 08/04/22 0429 08/05/22 0034 08/06/22 0154 08/07/22 0610  AST  --   --   --   --  10*  ALT  --   --   --   --  7  ALKPHOS  --   --   --   --  112  BILITOT  --   --   --   --  0.6  PROT  --   --   --   --  6.4*  ALBUMIN 2.3* 2.2* 2.3* 2.4* 2.6*   No results for input(s): "LIPASE", "AMYLASE" in the last 168 hours. No results for input(s): "AMMONIA" in the last 168 hours.  Coagulation Profile: No results for input(s): "INR", "PROTIME" in the last 168 hours.  Cardiac Enzymes: No results for input(s): "CKTOTAL", "CKMB", "CKMBINDEX", "TROPONINI" in the last 168 hours.  BNP (last 3 results) No results for input(s): "PROBNP" in the last 8760 hours.  Lipid Profile: No results for input(s): "CHOL", "HDL", "LDLCALC", "TRIG", "CHOLHDL", "LDLDIRECT" in the last 72 hours.  Thyroid Function Tests: No results for input(s): "TSH", "T4TOTAL", "FREET4", "T3FREE", "THYROIDAB" in the last 72 hours.  Anemia Panel: No results for input(s): "VITAMINB12", "FOLATE", "FERRITIN", "TIBC", "IRON", "RETICCTPCT" in the last 72 hours.  Urine analysis:    Component Value Date/Time   COLORURINE STRAW (A) 06/27/2022 1351   APPEARANCEUR CLEAR 06/27/2022 1351   LABSPEC 1.006 06/27/2022 1351   PHURINE 9.0 (H) 06/27/2022 1351   GLUCOSEU 150 (A) 06/27/2022 1351   HGBUR LARGE (A) 06/27/2022 1351   BILIRUBINUR NEGATIVE 06/27/2022 1351   KETONESUR NEGATIVE 06/27/2022 1351   PROTEINUR 30 (A) 06/27/2022 1351   NITRITE NEGATIVE 06/27/2022 1351   LEUKOCYTESUR MODERATE (A) 06/27/2022 1351    Sepsis Labs: Lactic Acid, Venous    Component Value Date/Time   LATICACIDVEN 0.6 07/30/2022 0200    MICROBIOLOGY: Recent Results (from the past 240 hour(s))  SARS Coronavirus 2 by RT PCR (hospital order, performed in [facility] hospital lab) *cepheid single result test* Anterior Nasal Swab     Status: None   Collection Time: 07/30/22   1:30 AM   Specimen: Anterior Nasal Swab  Result Value Ref Range Status   SARS Coronavirus 2 by RT PCR NEGATIVE NEGATIVE Final    Comment: Performed at [facility], [address] [place], [place] [address]  MRSA Next Gen by PCR, Nasal  Status: None   Collection Time: 07/30/22  8:35 AM   Specimen: Nasal Mucosa; Nasal Swab  Result Value Ref Range Status   MRSA by PCR Next Gen NOT DETECTED NOT DETECTED Final    Comment: (NOTE) The GeneXpert MRSA Assay (FDA approved for NASAL specimens only), is one component of a comprehensive MRSA colonization surveillance program. It is not intended to diagnose MRSA infection nor to guide or monitor treatment for MRSA infections. Test performance is not FDA approved in patients less than 42 years old. Performed at [facility], [address], [place], [place] [address]   Culture, blood (Routine X 2) w Reflex to ID Panel     Status: None   Collection Time: 07/30/22  4:02 PM   Specimen: BLOOD  Result Value Ref Range Status   Specimen Description BLOOD SITE NOT SPECIFIED  Final   Special Requests   Final    BOTTLES DRAWN AEROBIC AND ANAEROBIC Blood Culture adequate volume   Culture   Final    NO GROWTH 5 DAYS Performed at [facility], [address] [place], [place] [address]    Report Status 08/04/2022 FINAL  Final  Culture, blood (Routine X 2) w Reflex to ID Panel     Status: None   Collection Time: 07/30/22  4:02 PM   Specimen: BLOOD  Result Value Ref Range Status   Specimen Description BLOOD SITE NOT SPECIFIED  Final   Special Requests   Final    BOTTLES DRAWN AEROBIC AND ANAEROBIC Blood Culture adequate volume   Culture   Final    NO GROWTH 5 DAYS Performed at [facility], [address] [place], [place] [address]    Report Status 08/04/2022 FINAL  Final    RADIOLOGY STUDIES/RESULTS: No results found.   LOS: 9 days   Jeoffrey Massed, MD  [facility]    To contact the attending provider  between 7A-7P or the covering provider during after hours 7P-7A, please log into the web site [email or web address] and access using universal [place] password for that web site. If you do not have the password, please call the hospital operator.  08/08/2022, 11:35 AM

## 2022-08-08 NOTE — Progress Notes (Signed)
Subjective:   seen in rm , wife present, HD yest in recliner   was sl restless  but had complete tx  1.2 l uf , now sl lethargic  given Haldol  per wife =( was trying to pull clothes off)   Patient was given Haldol Objective Vital signs in last 24 hours: Vitals:   08/08/22 0700 08/08/22 0755 08/08/22 0830 08/08/22 1115  BP:  (!) 145/52 110/75 (!) 121/48  Pulse: 62 72 71 72  Resp: (!) 8 20 18  (!) 21  Temp:  (!) 97.5 F (36.4 C)    TempSrc:  Oral    SpO2: 90% 96% 96% 95%  Weight:      Height:       Weight change: 3 kg  Physical Exam: General: Lethargic ( just given Haldol) Chronically ill appearing  Elderly Male , NAD  Heart: RRR, No MRG Lungs: CTA   ant , nonlabored breathtaking  Abdomen: NABS, soft, NT, ND  Extremities: no pedal edema / R HAND  partial 2nd/ 4th fingers   amp   Dialysis Access:  LFA AVF+ bruit   Dialysis Orders: TTS at [facility] 3.5hr, 350/700, EDW 99kg, 2K, AVF, heparin 3700 bolus + 500U/hr pump - HD stopped 7/23 due to acute agitation, had been well prior to HD on 7/23 - Epogen 7600 units q HD   Assessment/Plan: 1. AMS/small pontine CVA:  Occurred Acutely during dialysis 7/23 - no clear etiology initially. No new meds. [facility] 7/24 negative. Does have baseline memory loss and occ confusion per wife, but this is much worse. Initial head CT negative except chronic brain atrophy, repeat 7/26 showed possible lesion in pons - unable to have MRI d/t spinal stimulator -> repeat CT 7/27 showed the same, felt to be small pontine CVA. Had seizure-like activity on 7/24 - started on Keppra. EEG c/w diffuse metabolic encephalopathy. Has chronic back pain with spinal stimulator and on chronic narcotics - on hold. NG tube was inserted. Now  has [place]  for Nocturnal tube feeds / Neuro following. Some improvement but does not appear back to baseline. Yest 1st  HD in [place]  2. ESRD: on HD TTS - has not missed HD, BUN low - not uremic. Symptoms not improved with HD. Continue HD in   [place]  as able ,next tx tomor   3. HTN/volume: BP ok, volume looks ok.   Has some UOP  tolerating some UF with HD. Using midodrine pre-HD. 4. Anemia: Hgb 10.5  - continue Aranesp weekly while here. 5. Secondary hyperparathyroidism:  Ca Corec 10.2 , Phos low 2.3 < 2.5 <2.1  binder held 7/28 -  PTH =64,  have dc vit d  6. Nutrition:Alb low  2.6-  no further NG tube feeding s , he pulled NGT out / has passed swallow test - per wife supper last pm tolerated t 7. 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 stable sites   Lenny Pastel, PA-C [facility] Beeper [phone] 08/08/2022,11:27 AM  LOS: 9 days   Labs: Basic Metabolic Panel: Recent Labs  Lab 08/04/22 0429 08/05/22 0034 08/06/22 0154 08/07/22 0610  NA 131* 132* 132* 130*  K 4.9 5.9* 5.0 5.0  CL 93* 93* 92* 91*  CO2 28 28 29 27   GLUCOSE 139* 133* 107* 81  BUN 43* 55* 32* 45*  CREATININE 3.98* 4.69* 3.17* 4.45*  CALCIUM 8.7* 9.1 8.9 9.2  PHOS 2.5 2.5 2.3*  --    Liver Function  Tests: Recent Labs  Lab 08/05/22 0034 08/06/22 0154 08/07/22 0610  AST  --   --  10*  ALT  --   --  7  ALKPHOS  --   --  112  BILITOT  --   --  0.6  PROT  --   --  6.4*  ALBUMIN 2.3* 2.4* 2.6*   No results for input(s): "LIPASE", "AMYLASE" in the last 168 hours. No results for input(s): "AMMONIA" in the last 168 hours. CBC: Recent Labs  Lab 08/03/22 0525 08/04/22 0429 08/05/22 0034 08/06/22 0154 08/07/22 0610  WBC 6.3 6.1 6.4 7.0 7.1  NEUTROABS  --   --   --   --  4.8  HGB 9.3* 9.2* 10.1* 10.5* 10.5*  HCT 30.7* 31.3* 33.3* 34.6* 34.5*  MCV 96.2 96.3 94.3 96.6 94.5  PLT 138* 127* 139* 141* 157   Cardiac Enzymes: No results for input(s): "CKTOTAL", "CKMB", "CKMBINDEX", "TROPONINI" in the last 168 hours. CBG: Recent Labs  Lab 08/07/22 1757 08/07/22 2026 08/08/22 0034 08/08/22 0330 08/08/22 0751  GLUCAP 164* 113* 86 84 70    Studies/Results: No results found. Medications:  sodium  chloride      (feeding supplement) PROSource Plus  30 mL Oral BID   aspirin EC  81 mg Oral Daily   bacitracin   Topical TID   Chlorhexidine Gluconate Cloth  6 each Topical Q0600   clopidogrel  75 mg Oral Daily   darbepoetin (ARANESP) injection - DIALYSIS  60 mcg Subcutaneous Q Thu-1800   divalproex  500 mg Oral Q12H   feeding supplement (NEPRO CARB STEADY)  237 mL Oral TID BM   heparin injection (subcutaneous)  5,000 Units Subcutaneous Q8H   levothyroxine  250 mcg Oral Q0600   midodrine  10 mg Oral Q T,Th,Sa-HD   pantoprazole  40 mg Oral BID   QUEtiapine  25 mg Oral QHS   rosuvastatin  10 mg Oral QHS   sodium chloride flush  3 mL Intravenous Q12H

## 2022-08-08 NOTE — Progress Notes (Signed)
Physical Therapy Treatment Patient Details Name: Chase [name] MRN: [id number] DOB: 16-Jun-1950 Today's Date: 08/08/2022   History of Present Illness 72 y.o. male presenting to [facility] from dialysis 07/29/2022 with abdominal pain and confusion. 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 PMH includes back pain, depression, CAD, DM, CKD, HTN, SDH, ESRD.    PT Comments  Patient is confused and lethargic. He has difficulty maintaining alertness for participation and patient spouse requesting to hold on any upright attempts due to cognition. Patient was able to participate intermittently with LE exercises for strengthening. Recommend to continue PT to maximize independence and decrease caregiver burden.    If plan is discharge home, recommend the following: Two people to help with walking and/or transfers;Two people to help with bathing/dressing/bathroom;Assist for transportation;Assistance with feeding;Direct supervision/assist for medications management   Can travel by private vehicle     No  Equipment Recommendations   (to be determined at next level of care)    Recommendations for Other Services       Precautions / Restrictions Precautions Precautions: Fall Restrictions Weight Bearing Restrictions: Yes RUE Weight Bearing: Non weight bearing Other Position/Activity Restrictions: (NWB through 1st and 4th fingers)     Mobility  Bed Mobility               General bed mobility comments: due to cognition and level of arousal, patient reuqires Max A for repositioning in bed. the spouse declined any attempts to sit upright due to level of alertess and recent confusion/agitation with pulling gown off, etc.    Transfers                        Ambulation/Gait                   Stairs             Wheelchair Mobility     Tilt Bed    Modified Rankin (Stroke Patients Only)       Balance                                             Cognition Arousal/Alertness: Lethargic, Suspect due to medications Behavior During Therapy: Flat affect Overall Cognitive Status: Difficult to assess                                 General Comments: patient has eyes closed most of the session but is arousable to voice and light touch. he has difficulty maintaining alertness for full participation        Exercises General Exercises - Lower Extremity Ankle Circles/Pumps: AAROM, Strengthening, Both, 10 reps, Supine, PROM Heel Slides: AAROM, Strengthening, Both, 10 reps, Supine Hip ABduction/ADduction: AAROM, Strengthening, Both, 10 reps, Supine Other Exercises Other Exercises: verbal cues and stimulation to remain alert and for participation with exercises    General Comments General comments (skin integrity, edema, etc.): encouraged spouse to re-orient patient frequently to assist with confusion. vitals stable throughout session      Pertinent Vitals/Pain Pain Assessment Pain Assessment: Faces Pain Score: 0-No pain    Home Living                          Prior Function  PT Goals (current goals can now be found in the care plan section) Acute Rehab PT Goals Patient Stated Goal: unable to participate with goal sitting PT Goal Formulation: Patient unable to participate in goal setting Time For Goal Achievement: 08/13/22 Potential to Achieve Goals: Fair Progress towards PT goals: Progressing toward goals    Frequency    Min 1X/week      PT Plan Current plan remains appropriate    Co-evaluation              AM-PAC PT "6 Clicks" Mobility   Outcome Measure  Help needed turning from your back to your side while in a flat bed without using bedrails?: Total Help needed moving from lying on your back to sitting on the side of a flat bed without using bedrails?: Total Help needed moving to and from a bed to a chair (including a wheelchair)?: Total Help needed  standing up from a chair using your arms (e.g., wheelchair or bedside chair)?: Total Help needed to walk in hospital room?: Total Help needed climbing 3-5 steps with a railing? : Total 6 Click Score: 6    End of Session   Activity Tolerance: Patient limited by lethargy Patient left: in bed;with call bell/phone within reach;with bed alarm set;with family/visitor present Nurse Communication: Mobility status PT Visit Diagnosis: Other abnormalities of gait and mobility (R26.89)     Time: 2956-2130 PT Time Calculation (min) (ACUTE ONLY): 10 min  Charges:    $Therapeutic Exercise: 8-22 mins PT General Charges $$ ACUTE PT VISIT: 1 Visit                    Donna Bernard, PT, MPT    Chase [name] 08/08/2022, 1:33 PM

## 2022-08-09 DIAGNOSIS — G9340 Encephalopathy, unspecified: Secondary | ICD-10-CM | POA: Diagnosis not present

## 2022-08-09 DIAGNOSIS — E1169 Type 2 diabetes mellitus with other specified complication: Secondary | ICD-10-CM | POA: Diagnosis not present

## 2022-08-09 DIAGNOSIS — Z794 Long term (current) use of insulin: Secondary | ICD-10-CM | POA: Diagnosis not present

## 2022-08-09 LAB — GLUCOSE, CAPILLARY
Glucose-Capillary: 106 mg/dL — ABNORMAL HIGH (ref 70–99)
Glucose-Capillary: 126 mg/dL — ABNORMAL HIGH (ref 70–99)
Glucose-Capillary: 79 mg/dL (ref 70–99)
Glucose-Capillary: 82 mg/dL (ref 70–99)
Glucose-Capillary: 90 mg/dL (ref 70–99)
Glucose-Capillary: 91 mg/dL (ref 70–99)
Glucose-Capillary: 92 mg/dL (ref 70–99)

## 2022-08-09 NOTE — Progress Notes (Signed)
Patient to hemo via chair.

## 2022-08-09 NOTE — Progress Notes (Signed)
Informed MD of infiltration and early stop of treatment.  Stacie Glaze LPN

## 2022-08-09 NOTE — Progress Notes (Signed)
Patient back from hemo failed treatment due to fistula infilltration.

## 2022-08-09 NOTE — Progress Notes (Signed)
PROGRESS NOTE        PATIENT DETAILS Name: Chase [name] Age: 72 y.o. Sex: male Date of Birth: 1950/04/05 Admit Date: 07/29/2022 Admitting Physician Joseph Art, DO ZOX:[id number] Alinda Dooms, MD  Brief Summary: Patient is a 72 y.o.  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  Evaluated by stroke MD-started on DAPT-unfortunately hospital course complicated by delirium/confusion.  See below for further details.  Significant events: 7/23>> admit to [facility] 7/24>> seizure-like activity-neuro consult.  Significant studies: 7/23>> CT head: No acute intracranial abnormality 7/23>> CT abdomen/pelvis: Stable intra/extrahepatic biliary ductal dilatation-with hyperdense material in the CBD-compatible with choledocholithiasis.  Left lower abdominal wall intramuscular hematoma has decreased in size. 7/23>> RUQ ultrasound: Cholecystectomy-otherwise unremarkable RUQ ultrasound. 7/24>> CT head: No acute finding. 7/24-7/25>> LTM EEG: No definite seizures were seen. 7/26>> CT head: Focal hypodense lesion in the left hemipons-possible age-indeterminate CVA 7/26>> echo: EF 60-65% 7/27>> CT head: Redemonstrated hypoattenuation in the left pons consistent with age-indeterminate infarct.  Significant microbiology data: 7/24>> blood culture: No growth 7/24>> COVID PCR: Negative  Procedures: None   Consults: Neurology Nephrology Phone consult-gastroenterology  Subjective: Some intermittent confusion continues but no other issues overnight.  Objective: Vitals: Blood pressure (!) 135/48, pulse 80, temperature 97.6 F (36.4 C), temperature source Oral, resp. rate (!) 25, height 5\' 6"  (1.676 m), weight 80.1 kg, SpO2 93%.   Exam: Gen Exam:not in any  distress HEENT:atraumatic, normocephalic Chest: B/L clear to auscultation anteriorly CVS:S1S2 regular Abdomen:soft non tender, non distended Extremities:no edema Neurology: Non focal Skin: no rash  Pertinent Labs/Radiology:    Latest Ref Rng & Units 08/09/2022    3:41 AM 08/07/2022    6:10 AM 08/06/2022    1:54 AM  CBC  WBC 4.0 - 10.5 K/uL 7.0  7.1  7.0   Hemoglobin 13.0 - 17.0 g/dL 54.0  98.1  19.1   Hematocrit 39.0 - 52.0 % 36.7  34.5  34.6   Platelets 150 - 400 K/uL 155  157  141     Lab Results  Component Value Date   NA 134 (L) 08/09/2022   K 4.4 08/09/2022   CL 94 (L) 08/09/2022   CO2 27 08/09/2022      Assessment/Plan: Seizure disorder Found to have seizure-like activity on 7/24 Neuro consulted-on Depakote Perhaps triggered by acute CVA. Thankfully no further seizures for the past several days. Maintain seizure precautions  Acute metabolic encephalopathy Felt to be multifactorial (polypharmacy/seizures/CVA/delirium all contributing)-neuroimaging as above-unfortunately unable to obtain MRI-due to spinal stimulator Still confused but better than what he was several days ago-however improvement seems to have plateaued over the past several days.  Per spouse-patient did have some cognitive issues even prior to this hospitalization-and hence he may just be having some hospital delirium as well.  Ammonia level stable-remains on Depakote and low-dose Seroquel.   Recent TSH/B12/RPR overall stable. Maintain delirium precautions Will discuss with neurology to see if any further workup is required which I do not think is required at this point.  Acute CVA Workup as above Neurology recommending aspirin/Plavix for 3 weeks then Plavix alone Remains on statin (LDL 39)  Choledocholithiasis Patient is s/p cholecystectomy-and has had numerous choledocholithiasis requiring  numerous ERCP in the past (x 5 total-last 2 years back) His CT abdomen on admission shows  choledocholithiasis-thankfully patient is asymptomatic-LFTs are stable.  Appreciate brief GI note on 8/1-no reason to pursue inpatient ERCP-recommendations are to follow-up with primary GI MD at [facility].    Hypoglycemia Borderline hypoglycemic this morning but asymptomatic-continue to encourage oral intake.    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  Hypothyroidism Synthroid  PAD Plavix  Debility/deconditioning Failure to thrive syndrome PT/OT eval Patient pulled out NG tube 7/30-claims he eating a decent amount of food orally-continue to watch closely.    Nutrition Status: Nutrition Problem: Inadequate oral intake Etiology: inability to eat Signs/Symptoms: other (comment) (mental status) Interventions: Refer to RD note for recommendations, Education  BMI: Estimated body mass index is 28.5 kg/m as calculated from the following:   Height as of this encounter: 5\' 6"  (1.676 m).   Weight as of this encounter: 80.1 kg.   Code status:   Code Status: Full Code   DVT Prophylaxis: heparin injection 5,000 Units Start: 07/31/22 1400 SCDs Start: 07/30/22 0746   Family Communication: [phone] -updated at bedside on 8/2.   Disposition Plan: Status is: Inpatient Remains inpatient appropriate because: Severity of illness   Planned Discharge Destination:Skilled nursing facility   Diet: Diet Order             DIET DYS 3 Fluid consistency: Thin  Diet effective now                      Antimicrobial agents: Anti-infectives (From admission, onward)    None        MEDICATIONS: Scheduled Meds:  (feeding supplement) PROSource Plus  30 mL Oral BID   aspirin EC  81 mg Oral Daily   bacitracin   Topical TID   Chlorhexidine Gluconate Cloth  6 each Topical Q0600   clopidogrel  75 mg Oral Daily   divalproex  500 mg Oral Q12H   feeding supplement (NEPRO CARB STEADY)  237 mL Oral TID BM   heparin injection (subcutaneous)  5,000 Units Subcutaneous Q8H   levothyroxine  250 mcg Oral Q0600   midodrine  10 mg Oral Q T,Th,Sa-HD   pantoprazole  40 mg Oral BID   QUEtiapine  25 mg Oral QHS   rosuvastatin  10 mg Oral QHS   sodium chloride flush  3 mL Intravenous Q12H   Continuous Infusions:  sodium chloride     PRN Meds:.sodium chloride, acetaminophen **OR** acetaminophen, albuterol, haloperidol **OR** haloperidol lactate, ondansetron **OR** ondansetron (ZOFRAN) IV, sodium chloride flush   I have personally reviewed following labs and imaging studies  LABORATORY DATA: CBC: Recent Labs  Lab 08/04/22 0429 08/05/22 0034 08/06/22 0154 08/07/22 0610 08/09/22 0341  WBC 6.1 6.4 7.0 7.1 7.0  NEUTROABS  --   --   --  4.8  --   HGB 9.2* 10.1* 10.5* 10.5* 11.1*  HCT 31.3* 33.3* 34.6* 34.5* 36.7*  MCV 96.3 94.3 96.6 94.5 94.1  PLT 127* 139* 141* 157  155    Basic Metabolic Panel: Recent Labs  Lab 08/02/22 1754 08/02/22 1754 08/03/22 0525 08/03/22 0526 08/04/22 0429 08/05/22 0034 08/06/22 0154 08/07/22 0610 08/09/22 0341  NA  --    < >  --  132* 131* 132* 132* 130* 134*  K  --    < >  --  4.1 4.9 5.9* 5.0 5.0 4.4  CL  --    < >  --  93* 93* 93* 92* 91* 94*  CO2  --    < >  --  29 28 28 29 27 27   GLUCOSE  --    < >  --  147* 139* 133* 107* 81 91  BUN  --    < >  --  24* 43* 55* 32* 45* 38*  CREATININE  --    < >  --  3.11* 3.98* 4.69* 3.17* 4.45* 4.50*  CALCIUM  --    < >  --  8.5* 8.7* 9.1 8.9 9.2 9.4  MG 1.5*  --  1.5*  --  1.9 1.9 1.7  --   --   PHOS 2.1*   --   --  2.1* 2.5 2.5 2.3*  --  3.7   < > = values in this interval not displayed.    GFR: Estimated Creatinine Clearance: 15.2 mL/min (A) (by C-G formula based on SCr of 4.5 mg/dL (H)).  Liver Function Tests: Recent Labs  Lab 08/04/22 0429 08/05/22 0034 08/06/22 0154 08/07/22 0610 08/09/22 0341  AST  --   --   --  10*  --   ALT  --   --   --  7  --   ALKPHOS  --   --   --  112  --   BILITOT  --   --   --  0.6  --   PROT  --   --   --  6.4*  --   ALBUMIN 2.2* 2.3* 2.4* 2.6* 2.7*   No results for input(s): "LIPASE", "AMYLASE" in the last 168 hours. Recent Labs  Lab 08/09/22 0341  AMMONIA 16    Coagulation Profile: No results for input(s): "INR", "PROTIME" in the last 168 hours.  Cardiac Enzymes: No results for input(s): "CKTOTAL", "CKMB", "CKMBINDEX", "TROPONINI" in the last 168 hours.  BNP (last 3 results) No results for input(s): "PROBNP" in the last 8760 hours.  Lipid Profile: No results for input(s): "CHOL", "HDL", "LDLCALC", "TRIG", "CHOLHDL", "LDLDIRECT" in the last 72 hours.  Thyroid Function Tests: No results for input(s): "TSH", "T4TOTAL", "FREET4", "T3FREE", "THYROIDAB" in the last 72 hours.  Anemia Panel: No results for input(s): "VITAMINB12", "FOLATE", "FERRITIN", "TIBC", "IRON", "RETICCTPCT" in the last 72 hours.  Urine analysis:    Component Value Date/Time   COLORURINE STRAW (A) 06/27/2022 1351   APPEARANCEUR CLEAR 06/27/2022 1351   LABSPEC 1.006 06/27/2022 1351   PHURINE 9.0 (H) 06/27/2022 1351   GLUCOSEU 150 (A) 06/27/2022 1351   HGBUR LARGE (A) 06/27/2022 1351   BILIRUBINUR NEGATIVE 06/27/2022 1351   KETONESUR NEGATIVE 06/27/2022 1351   PROTEINUR 30 (A) 06/27/2022 1351   NITRITE NEGATIVE 06/27/2022 1351   LEUKOCYTESUR MODERATE (A) 06/27/2022 1351    Sepsis Labs: Lactic Acid, Venous    Component Value Date/Time   LATICACIDVEN 0.6 07/30/2022 0200    MICROBIOLOGY: Recent Results (from the past 240 hour(s))  Culture, blood (Routine  X 2) w Reflex to ID Panel     Status: None   Collection Time: 07/30/22  4:02 PM   Specimen: BLOOD  Result Value Ref Range Status   Specimen Description BLOOD SITE NOT SPECIFIED  Final   Special Requests   Final    BOTTLES DRAWN AEROBIC AND ANAEROBIC Blood Culture adequate volume   Culture   Final    NO GROWTH 5 DAYS Performed at [facility], [address] [place], [place] [address]    Report Status 08/04/2022 FINAL  Final  Culture, blood (Routine X 2) w Reflex to ID Panel     Status: None   Collection Time: 07/30/22  4:02 PM   Specimen: BLOOD  Result Value Ref Range Status   Specimen Description BLOOD SITE NOT SPECIFIED  Final   Special Requests   Final    BOTTLES DRAWN AEROBIC AND ANAEROBIC Blood Culture adequate volume   Culture   Final    NO GROWTH 5 DAYS Performed at [facility], [address], [place], [place] [address]    Report Status 08/04/2022 FINAL  Final    RADIOLOGY STUDIES/RESULTS: No results found.   LOS: 10 days   Jeoffrey Massed, MD  [facility]    To contact the attending provider between 7A-7P or the covering provider during after hours 7P-7A, please log into the web site [email or web address] and access using universal [place] password for that web site. If you do not have the password, please call the hospital operator.  08/09/2022, 10:58 AM

## 2022-08-09 NOTE — Progress Notes (Signed)
Received patient in bed to unit.  Alert and oriented.  Informed consent signed and in chart.   TX duration:  Patient tolerated well.  Transported back to the room  Alert, without acute distress.  Hand-off given to patient's nurse.   Access used: R AVF Access issues: infiltrated, ended session early  Total UF removed: Medication(s) given: Halidol   08/09/22 1646  Vitals  BP (!) 121/104  BP Location Right Arm  BP Method Automatic  Patient Position (if appropriate) Lying  Pulse Rate 78  Pulse Rate Source Monitor  ECG Heart Rate 77  Resp (!) 26  Oxygen Therapy  SpO2 98 %  O2 Device Room Air  Patient Activity (if Appropriate) In chair  Pulse Oximetry Type Continuous  During Treatment Monitoring  HD Safety Checks Performed Yes  Intra-Hemodialysis Comments Tx completed (Patient session ended due to infiltration)  Fistula / Graft Left Forearm  Placement Date/Time: (c) 10/25/20 (c) 0500   Placed prior to admission: Yes  Orientation: Left  Access Location: Forearm  Status Deaccessed  Drainage Description Black     Stacie Glaze LPN [facility]

## 2022-08-09 NOTE — Plan of Care (Signed)
Patient remains confused and agitated.

## 2022-08-09 NOTE — Progress Notes (Signed)
[facility] Progress Note   Subjective: Seen in room. Agitated earlier per RN, now with mitten restraints. Says he is at "cone" otherwise not answering questions. SR on monitor.   Objective Vitals:   08/09/22 0000 08/09/22 0320 08/09/22 0400 08/09/22 0500  BP: (!) 116/54 (!) 135/57 (!) 135/48   Pulse: 81 73 80   Resp: (!) 21 (!) 24 (!) 25   Temp: 98.2 F (36.8 C) 97.6 F (36.4 C)    TempSrc: Oral Oral    SpO2: 97% 94% 93%   Weight:    80.1 kg  Height:       Physical Exam General: Chronically ill appearing male agitated when awake. Neuro: Oriented to self, place. Not following commands.  Heart: S1,S2 no M/R/G SR on monitor Lungs: decreased in bases posteriorly otherwise CTAB. RR 24 shallow.  Abdomen: NABS, NT Extremities: No LE edema. Mitten on hands. Dialysis Access:L AVF +T/B   Additional Objective Labs: Basic Metabolic Panel: Recent Labs  Lab 08/05/22 0034 08/06/22 0154 08/07/22 0610 08/09/22 0341  NA 132* 132* 130* 134*  K 5.9* 5.0 5.0 4.4  CL 93* 92* 91* 94*  CO2 28 29 27 27   GLUCOSE 133* 107* 81 91  BUN 55* 32* 45* 38*  CREATININE 4.69* 3.17* 4.45* 4.50*  CALCIUM 9.1 8.9 9.2 9.4  PHOS 2.5 2.3*  --  3.7   Liver Function Tests: Recent Labs  Lab 08/06/22 0154 08/07/22 0610 08/09/22 0341  AST  --  10*  --   ALT  --  7  --   ALKPHOS  --  112  --   BILITOT  --  0.6  --   PROT  --  6.4*  --   ALBUMIN 2.4* 2.6* 2.7*   No results for input(s): "LIPASE", "AMYLASE" in the last 168 hours. CBC: Recent Labs  Lab 08/04/22 0429 08/05/22 0034 08/06/22 0154 08/07/22 0610 08/09/22 0341  WBC 6.1 6.4 7.0 7.1 7.0  NEUTROABS  --   --   --  4.8  --   HGB 9.2* 10.1* 10.5* 10.5* 11.1*  HCT 31.3* 33.3* 34.6* 34.5* 36.7*  MCV 96.3 94.3 96.6 94.5 94.1  PLT 127* 139* 141* 157 155   Blood Culture    Component Value Date/Time   SDES BLOOD SITE NOT SPECIFIED 07/30/2022 1602   SDES BLOOD SITE NOT SPECIFIED 07/30/2022 1602   SPECREQUEST  07/30/2022  1602    BOTTLES DRAWN AEROBIC AND ANAEROBIC Blood Culture adequate volume   SPECREQUEST  07/30/2022 1602    BOTTLES DRAWN AEROBIC AND ANAEROBIC Blood Culture adequate volume   CULT  07/30/2022 1602    NO GROWTH 5 DAYS Performed at [facility], [address], [place], [place] [address]    CULT  07/30/2022 1602    NO GROWTH 5 DAYS Performed at [facility], [address] [place], [place] [address]    REPTSTATUS 08/04/2022 FINAL 07/30/2022 1602   REPTSTATUS 08/04/2022 FINAL 07/30/2022 1602    Cardiac Enzymes: No results for input(s): "CKTOTAL", "CKMB", "CKMBINDEX", "TROPONINI" in the last 168 hours. CBG: Recent Labs  Lab 08/08/22 1521 08/08/22 2045 08/09/22 0010 08/09/22 0319 08/09/22 0807  GLUCAP 92 81 106* 92 79   Iron Studies: No results for input(s): "IRON", "TIBC", "TRANSFERRIN", "FERRITIN" in the last 72 hours. @lablastinr3 @ Studies/Results: No results found. Medications:  sodium chloride      (feeding supplement) PROSource Plus  30 mL Oral BID   aspirin EC  81 mg Oral Daily  bacitracin   Topical TID   Chlorhexidine Gluconate Cloth  6 each Topical Q0600   clopidogrel  75 mg Oral Daily   divalproex  500 mg Oral Q12H   feeding supplement (NEPRO CARB STEADY)  237 mL Oral TID BM   heparin injection (subcutaneous)  5,000 Units Subcutaneous Q8H   levothyroxine  250 mcg Oral Q0600   midodrine  10 mg Oral Q T,Th,Sa-HD   pantoprazole  40 mg Oral BID   QUEtiapine  25 mg Oral QHS   rosuvastatin  10 mg Oral QHS   sodium chloride flush  3 mL Intravenous Q12H     Dialysis Orders: TTS at [facility] 3.5hr, 350/700, EDW 99kg, 2K, AVF, heparin 3700 units bolus + 500U/hr pump. Total dose 4700 units IV (Stop heparin pump one hour prior to end of HD) - HD stopped 7/23 due to acute agitation, had been well prior to HD on 7/23 - Epogen 7600 units sq q HD   Assessment/Plan: 1. AMS/small pontine CVA:  Occurred Acutely during dialysis 7/23 - no clear etiology  initially. No new meds. [facility] 7/24 negative. Does have baseline memory loss and occ confusion per wife, but this is much worse. Initial head CT negative except chronic brain atrophy, repeat 7/26 showed possible lesion in pons - unable to have MRI d/t spinal stimulator -> repeat CT 7/27 showed the same, felt to be small pontine CVA. Had seizure-like activity on 7/24 - started on Keppra. EEG c/w diffuse metabolic encephalopathy. Has chronic back pain with spinal stimulator and on chronic narcotics - on hold. NG tube was inserted. Now  has [place]  for Nocturnal tube feeds / Neuro following. Some improvement but does not appear back to baseline. Yest 1st  HD in [place]  2. ESRD: on HD TTS - has not missed HD, BUN low - not uremic. Symptoms not improved with HD. Continue HD in  [place]  as able. Next HD 08/09/2022 3. HTN/volume: BP ok, volume looks ok.   Has some UOP  tolerating some UF with HD. Using midodrine pre-HD. 4. Anemia: Hgb 10.5  - continue Aranesp weekly while here. 5. Secondary hyperparathyroidism:  Ca Corec 10.2 Low PTH/PO4. Holding VDRA/binders.  6. Nutrition:Alb low  2.6. Dsy 3 diet. Add protein supplements 7. 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 stable sites   Chase H.  NP-C 08/09/2022, 9:40 AM  [employer] [phone]

## 2022-08-10 ENCOUNTER — Inpatient Hospital Stay (HOSPITAL_COMMUNITY): Payer: Medicare Other

## 2022-08-10 DIAGNOSIS — G9340 Encephalopathy, unspecified: Secondary | ICD-10-CM

## 2022-08-10 DIAGNOSIS — E1169 Type 2 diabetes mellitus with other specified complication: Secondary | ICD-10-CM | POA: Diagnosis not present

## 2022-08-10 DIAGNOSIS — Z794 Long term (current) use of insulin: Secondary | ICD-10-CM | POA: Diagnosis not present

## 2022-08-10 LAB — CBC
HCT: 36.3 % — ABNORMAL LOW (ref 39.0–52.0)
HCT: 36.4 % — ABNORMAL LOW (ref 39.0–52.0)
Hemoglobin: 11.3 g/dL — ABNORMAL LOW (ref 13.0–17.0)
Hemoglobin: 11.3 g/dL — ABNORMAL LOW (ref 13.0–17.0)
MCH: 29.7 pg (ref 26.0–34.0)
MCH: 29.2 pg (ref 26.0–34.0)
MCHC: 31.1 g/dL (ref 30.0–36.0)
MCHC: 31 g/dL (ref 30.0–36.0)
MCV: 95.3 fL (ref 80.0–100.0)
MCV: 94.1 fL (ref 80.0–100.0)
Platelets: 123 K/uL — ABNORMAL LOW (ref 150–400)
Platelets: 117 K/uL — ABNORMAL LOW (ref 150–400)
RBC: 3.81 MIL/uL — ABNORMAL LOW (ref 4.22–5.81)
RBC: 3.87 MIL/uL — ABNORMAL LOW (ref 4.22–5.81)
RDW: 15.1 % (ref 11.5–15.5)
RDW: 15.1 % (ref 11.5–15.5)
WBC: 7.5 K/uL (ref 4.0–10.5)
WBC: 7.7 K/uL (ref 4.0–10.5)
nRBC: 0 % (ref 0.0–0.2)
nRBC: 0 % (ref 0.0–0.2)

## 2022-08-10 LAB — TYPE AND SCREEN
ABO/RH(D): O POS
Antibody Screen: NEGATIVE

## 2022-08-10 LAB — GLUCOSE, CAPILLARY
Glucose-Capillary: 110 mg/dL — ABNORMAL HIGH (ref 70–99)
Glucose-Capillary: 116 mg/dL — ABNORMAL HIGH (ref 70–99)
Glucose-Capillary: 73 mg/dL (ref 70–99)
Glucose-Capillary: 89 mg/dL (ref 70–99)
Glucose-Capillary: 99 mg/dL (ref 70–99)
Glucose-Capillary: 99 mg/dL (ref 70–99)

## 2022-08-10 MED ORDER — IOHEXOL 350 MG/ML SOLN
75.0000 mL | Freq: Once | INTRAVENOUS | Status: AC | PRN
  Administered 2022-08-10: 75 mL via INTRAVENOUS

## 2022-08-10 MED ORDER — HYDROCORTISONE ACETATE 25 MG RE SUPP
25.0000 mg | Freq: Two times a day (BID) | RECTAL | Status: DC
  Filled 2022-08-10 (×3): qty 1

## 2022-08-10 MED ORDER — QUETIAPINE FUMARATE 25 MG PO TABS
25.0000 mg | ORAL_TABLET | Freq: Two times a day (BID) | ORAL | Status: DC
  Administered 2022-08-10 – 2022-08-11 (×2): 25 mg via ORAL
  Filled 2022-08-10 (×2): qty 1

## 2022-08-10 NOTE — Plan of Care (Signed)
Patient with poor appetite and remains confused. Patient having bloody stools today md aware.

## 2022-08-10 NOTE — Progress Notes (Signed)
Informed by RN-that patient had on large BM-bloody/clots Vitals stable Repeat Hb 11.3  Will monitor closely Hold ASA/Plavix/SQ heparin Repeat CBC later this evening Type and screen If hematochezia recurrs-CT Angio abd

## 2022-08-10 NOTE — Progress Notes (Signed)
EEG complete - results pending 

## 2022-08-10 NOTE — Procedures (Signed)
Patient Name: Chase [name]  MRN: [id number]  Epilepsy Attending: Windell Norfolk  Referring Physician/Provider: Jerral Ralph Date: 08/10/2022 Duration: 23 minutes  Patient history: 72 year old man with continued confusion, EEG to evaluate for seizure  Level of alertness: Awake, confused  AEDs during EEG study:   Technical aspects: This EEG study was done with scalp electrodes positioned according to the 10-20 International system of electrode placement. Electrical activity was reviewed with band pass filter of 1-70Hz , sensitivity of 7 uV/mm, display speed of 24mm/sec with a 60Hz  notched filter applied as appropriate. EEG data were recorded continuously and digitally stored.  Video monitoring was available and reviewed as appropriate.  Description: The posterior dominant rhythm consists of 9-10 Hz activity of moderate voltage (25-35 uV) seen predominantly in posterior head regions, symmetric and reactive to eye opening and eye closing. Drowsiness was characterized by attenuation of the posterior background rhythm. Sleep was characterized by vertex waves, sleep spindles (12 to 14 Hz), maximal frontocentral region.  There is an excessive amount of 15 to 18 Hz, 2-3 uV beta activity with irregular morphology distributed symmetrically and diffusely.  EEG showed continuous generalized polymorphic sharply contoured 3 to 6 Hz theta-delta slowing. Sleep was not seen.  Hyperventilation and photic stimulation were not performed. There were no seizure or epileptiform discharges seen during this recording.   ABNORMALITY - Continuous slow, generalized   IMPRESSION: This study is suggestive of mild to moderate diffuse encephalopathy, nonspecific etiology but likely related to sedation, toxic-metabolic etiology. There were no seizures or epileptiform discharges seen during this recording.      

## 2022-08-10 NOTE — Progress Notes (Signed)
Has had another Large bloody BM w clots BP remains stable Awaiting repeat CBC Spoke w spouse, understands that given overall clinical situation (may have to stop HD given no improvement in mental status)-limited treatment options-aware that DAPT held. She has had a chance to talk w neprology and has been contemplating a lot of end of life issues. She desires some interventions-but nothing aggressive. Agree's for a a DNR order, ok to pursue CT Angio abd, but will have to discuss further regarding embolization if CT study is positive. Palliative consult pending for further delineation of goals of care. Will d/w GI MD on call as well.

## 2022-08-10 NOTE — Progress Notes (Signed)
Night coverage  Notified by RN that patient has had 2 more bloody bowel movements with clots, moderate amount since 7 PM.  Blood pressure 141/43, heart rate 79.    CTA abdomen pelvis done this evening was negative for active GI hemorrhage, please read full CT report for other findings.  Hemoglobin was 11.3 on labs done around 7 PM.  Continue to hold DAPT and anticoagulation.  Daytime physician had spoken to the patient and he did not want any aggressive interventions.  Patient is DNR/DNI.  Palliative care has already been consulted for goals of care discussion.  I have discussed with on-call physician for [facility] (Dr. Rhea Belton), recommending serial H&H every 6 hours and transfusing PRBCs if hemoglobin <8.  Type and screen already done.  GI will consult in the morning unless if patient hemodynamically unstable tonight then will call GI again.  I have requested RN to make sure that patient has 2 large-bore IVs in place.  I have also spoken to the patient and discussed benefits versus risks associated with blood products, patient is okay with receiving blood or platelet transfusions if needed.  Also confirmed CODE STATUS with the patient, he is DNR/DNI.

## 2022-08-10 NOTE — Progress Notes (Signed)
PROGRESS NOTE        PATIENT DETAILS Name: Chase [name] Age: 72 y.o. Sex: male Date of Birth: [birth year] Admit Date: 07/29/2022 Admitting Physician Chase Art, DO ZOX:[id number] Chase Dooms, MD  Brief Summary: Patient is a 72 y.o.  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  Evaluated by stroke MD-started on DAPT-unfortunately hospital course complicated by delirium/confusion.  See below for further details.  Significant events: 7/23>> admit to [facility] 7/24>> seizure-like activity-neuro consult.  Significant studies: 7/23>> CT head: No acute intracranial abnormality 7/23>> CT abdomen/pelvis: Stable intra/extrahepatic biliary ductal dilatation-with hyperdense material in the CBD-compatible with choledocholithiasis.  Left lower abdominal wall intramuscular hematoma has decreased in size. 7/23>> RUQ ultrasound: Cholecystectomy-otherwise unremarkable RUQ ultrasound. 7/24>> CT head: No acute finding. 7/24-7/25>> LTM EEG: No definite seizures were seen. 7/26>> CT head: Focal hypodense lesion in the left hemipons-possible age-indeterminate CVA 7/26>> echo: EF 60-65% 7/27>> CT head: Redemonstrated hypoattenuation in the left pons consistent with age-indeterminate infarct.  Significant microbiology data: 7/24>> blood culture: No growth 7/24>> COVID PCR: Negative  Procedures: None   Consults: Neurology Nephrology Phone consult-gastroenterology  Subjective: Some intermittent confusion continues but no other issues overnight.  Objective: Vitals: Blood pressure (!) 86/58, pulse 78, temperature 97.9 F (36.6 C), temperature source Axillary, resp. rate 20, height 5\' 6"  (1.676 m), weight 80.1 kg, SpO2 93%.   Exam: Gen Exam:not in any  distress HEENT:atraumatic, normocephalic Chest: B/L clear to auscultation anteriorly CVS:S1S2 regular Abdomen:soft non tender, non distended Extremities:no edema Neurology: Non focal Skin: no rash  Pertinent Labs/Radiology:    Latest Ref Rng & Units 08/09/2022    3:41 AM 08/07/2022    6:10 AM 08/06/2022    1:54 AM  CBC  WBC 4.0 - 10.5 K/uL 7.0  7.1  7.0   Hemoglobin 13.0 - 17.0 g/dL 54.0  98.1  19.1   Hematocrit 39.0 - 52.0 % 36.7  34.5  34.6   Platelets 150 - 400 K/uL 155  157  141     Lab Results  Component Value Date   NA 134 (L) 08/09/2022   K 4.4 08/09/2022   CL 94 (L) 08/09/2022   CO2 27 08/09/2022      Assessment/Plan: Seizure disorder Found to have seizure-like activity on 7/24 Neuro consulted-on Depakote Perhaps triggered by acute CVA. Thankfully no further seizures for the past several days. Maintain seizure precautions  Acute metabolic encephalopathy Felt to be multifactorial (polypharmacy/seizures/CVA/delirium all contributing)-neuroimaging as above-unfortunately unable to obtain MRI-due to spinal stimulator Although somewhat improved compared to how he first presented-his improvement has plateaued-no other etiology evident-suspect this just may be hospital delirium superimposed on some amount of chronic cognitive dysfunction.  Repeat ammonia levels stable.  Recent TSH/B12/RPR all stable. Discussed with neurologist-Chase [name]-repeat spot EEG today-if negative-very no other workup left-as MRI cannot be done.  This encephalopathy will likely clear with time per neurology.    Acute CVA Workup as above Neurology recommending aspirin/Plavix for 3 weeks then Plavix alone Remains on statin (LDL 39)  Choledocholithiasis Patient is s/p cholecystectomy-and has had numerous choledocholithiasis requiring numerous ERCP in the past (x 5 total-last 2 years back) His CT abdomen on admission shows choledocholithiasis-thankfully patient  is asymptomatic-LFTs are stable.  Appreciate  brief GI note on 8/1-no reason to pursue inpatient ERCP-recommendations are to follow-up with primary GI MD at [facility].    Hypoglycemia Borderline hypoglycemic this morning but asymptomatic-continue to encourage oral intake.    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  Hypothyroidism Synthroid  PAD Plavix  Debility/deconditioning Failure to thrive syndrome PT/OT eval Patient pulled out NG tube 7/30-claims he eating a decent amount of food orally-continue to watch closely.    Nutrition Status: Nutrition Problem: Inadequate oral intake Etiology: inability to eat Signs/Symptoms: other (comment) (mental status) Interventions: Refer to RD note for recommendations, Education  BMI: Estimated body mass index is 28.5 kg/m as calculated from the following:   Height as of this encounter: 5\' 6"  (1.676 m).   Weight as of this encounter: 80.1 kg.   Code status:   Code Status: Full Code   DVT Prophylaxis: heparin injection 5,000 Units Start: 07/31/22 1400 SCDs Start: 07/30/22 0746   Family Communication: Spouse-Chase [name] updated 8/4   Disposition Plan: Status is: Inpatient Remains inpatient appropriate because: Severity of illness   Planned Discharge Destination:Skilled nursing facility   Diet: Diet Order             DIET DYS 3 Fluid consistency: Thin  Diet effective now                     Antimicrobial agents: Anti-infectives (From admission, onward)    None         MEDICATIONS: Scheduled Meds:  (feeding supplement) PROSource Plus  30 mL Oral BID   aspirin EC  81 mg Oral Daily   bacitracin   Topical TID   Chlorhexidine Gluconate Cloth  6 each Topical Q0600   clopidogrel  75 mg Oral Daily   divalproex  500 mg Oral Q12H   feeding supplement (NEPRO CARB STEADY)  237 mL Oral TID BM   heparin injection (subcutaneous)  5,000 Units Subcutaneous Q8H   levothyroxine  250 mcg Oral Q0600   midodrine  10 mg Oral Q T,Th,Sa-HD   pantoprazole  40 mg Oral BID   QUEtiapine  25 mg Oral QHS   rosuvastatin  10 mg Oral QHS   sodium chloride flush  3 mL Intravenous Q12H   Continuous Infusions:  sodium chloride     PRN Meds:.sodium chloride, acetaminophen **OR** acetaminophen, albuterol, haloperidol **OR** haloperidol lactate, ondansetron **OR** ondansetron (ZOFRAN) IV, sodium chloride flush   I have personally reviewed following labs and imaging studies  LABORATORY DATA: CBC: Recent Labs  Lab 08/04/22 0429 08/05/22 0034 08/06/22 0154 08/07/22 0610 08/09/22 0341  WBC 6.1 6.4 7.0 7.1 7.0  NEUTROABS  --   --   --  4.8  --   HGB 9.2* 10.1* 10.5* 10.5* 11.1*  HCT 31.3* 33.3* 34.6* 34.5* 36.7*  MCV 96.3 94.3 96.6 94.5 94.1  PLT 127* 139* 141* 157 155    Basic Metabolic Panel: Recent Labs  Lab 08/04/22 0429 08/05/22 0034 08/06/22 0154 08/07/22 0610 08/09/22 0341  NA 131* 132* 132* 130* 134*  K 4.9 5.9* 5.0 5.0 4.4  CL 93* 93* 92* 91* 94*  CO2 28 28 29 27 27   GLUCOSE 139* 133* 107* 81 91  BUN 43* 55* 32* 45* 38*  CREATININE 3.98* 4.69* 3.17* 4.45* 4.50*  CALCIUM 8.7* 9.1 8.9 9.2 9.4  MG 1.9 1.9 1.7  --   --   PHOS 2.5 2.5 2.3*  --  3.7    GFR: Estimated Creatinine Clearance: 15.2 mL/min (A) (by C-G formula based on SCr of 4.5 mg/dL (H)).  Liver Function Tests: Recent Labs  Lab 08/04/22 0429 08/05/22 0034 08/06/22 0154 08/07/22 0610 08/09/22 0341  AST  --   --   --  10*  --   ALT  --   --   --  7  --   ALKPHOS  --   --   --   112  --   BILITOT  --   --   --  0.6  --   PROT  --   --   --  6.4*  --   ALBUMIN 2.2* 2.3* 2.4* 2.6* 2.7*   No results for input(s): "LIPASE", "AMYLASE" in the last 168 hours. Recent Labs  Lab 08/09/22 0341  AMMONIA 16    Coagulation Profile: No results for input(s): "INR", "PROTIME" in the last 168 hours.  Cardiac Enzymes: No results for input(s): "CKTOTAL", "CKMB", "CKMBINDEX", "TROPONINI" in the last 168 hours.  BNP (last 3 results) No results for input(s): "PROBNP" in the last 8760 hours.  Lipid Profile: No results for input(s): "CHOL", "HDL", "LDLCALC", "TRIG", "CHOLHDL", "LDLDIRECT" in the last 72 hours.  Thyroid Function Tests: No results for input(s): "TSH", "T4TOTAL", "FREET4", "T3FREE", "THYROIDAB" in the last 72 hours.  Anemia Panel: No results for input(s): "VITAMINB12", "FOLATE", "FERRITIN", "TIBC", "IRON", "RETICCTPCT" in the last 72 hours.  Urine analysis:    Component Value Date/Time   COLORURINE STRAW (A) 06/27/2022 1351   APPEARANCEUR CLEAR 06/27/2022 1351   LABSPEC 1.006 06/27/2022 1351   PHURINE 9.0 (H) 06/27/2022 1351   GLUCOSEU 150 (A) 06/27/2022 1351   HGBUR LARGE (A) 06/27/2022 1351   BILIRUBINUR NEGATIVE 06/27/2022 1351   KETONESUR NEGATIVE 06/27/2022 1351   PROTEINUR 30 (A) 06/27/2022 1351   NITRITE NEGATIVE 06/27/2022 1351   LEUKOCYTESUR MODERATE (A) 06/27/2022 1351    Sepsis Labs: Lactic Acid, Venous    Component Value Date/Time   LATICACIDVEN 0.6 07/30/2022 0200    MICROBIOLOGY: No results found for this or any previous visit (from the past 240 hour(s)).   RADIOLOGY STUDIES/RESULTS: No results found.   LOS: 11 days   Jeoffrey Massed, MD  [facility]    To contact the attending provider between 7A-7P or the covering provider during after hours 7P-7A, please log into the web site [email or web address] and access using universal [place] password for that web site. If you do not have the password, please call the hospital  operator.  08/10/2022, 10:38 AM

## 2022-08-10 NOTE — Progress Notes (Signed)
[facility] Progress Note   Subjective: Seen in room. Sleeping.  Wife present.  Agitated at dialysis all of a sudden yesterday and before anyone could assist he infiltrated his fistula and the treatment was stopped.   Wife aware that if this is his baseline mental status now he won't be able to continue outpt dialysis  Objective Vitals:   08/10/22 0400 08/10/22 0730 08/10/22 0900 08/10/22 1208  BP: 110/83 (!) 86/58  (!) 116/50  Pulse: 82 78  76  Resp: (!) 21 20  (!) 26  Temp: 98.2 F (36.8 C) 97.9 F (36.6 C)  98.2 F (36.8 C)  TempSrc: Oral Axillary  Axillary  SpO2: 93% 93% 94% 98%  Weight:      Height:       Physical Exam General: Chronically ill appearing male sleeping Heart: S1,S2 no M/R/G SR on monitor Lungs: decreased in bases posteriorly otherwise CTAB.  Abdomen: NABS, NT Extremities: No LE edema. Mitten on hands. Dialysis Access:L AVF +T/B --> did not remove dressing as wife said had a bit of bleeding earlier   Additional Objective Labs: Basic Metabolic Panel: Recent Labs  Lab 08/05/22 0034 08/06/22 0154 08/07/22 0610 08/09/22 0341  NA 132* 132* 130* 134*  K 5.9* 5.0 5.0 4.4  CL 93* 92* 91* 94*  CO2 28 29 27 27   GLUCOSE 133* 107* 81 91  BUN 55* 32* 45* 38*  CREATININE 4.69* 3.17* 4.45* 4.50*  CALCIUM 9.1 8.9 9.2 9.4  PHOS 2.5 2.3*  --  3.7   Liver Function Tests: Recent Labs  Lab 08/06/22 0154 08/07/22 0610 08/09/22 0341  AST  --  10*  --   ALT  --  7  --   ALKPHOS  --  112  --   BILITOT  --  0.6  --   PROT  --  6.4*  --   ALBUMIN 2.4* 2.6* 2.7*   No results for input(s): "LIPASE", "AMYLASE" in the last 168 hours. CBC: Recent Labs  Lab 08/04/22 0429 08/05/22 0034 08/06/22 0154 08/07/22 0610 08/09/22 0341  WBC 6.1 6.4 7.0 7.1 7.0  NEUTROABS  --   --   --  4.8  --   HGB 9.2* 10.1* 10.5* 10.5* 11.1*  HCT 31.3* 33.3* 34.6* 34.5* 36.7*  MCV 96.3 94.3 96.6 94.5 94.1  PLT 127* 139* 141* 157 155   Blood Culture     Component Value Date/Time   SDES BLOOD SITE NOT SPECIFIED 07/30/2022 1602   SDES BLOOD SITE NOT SPECIFIED 07/30/2022 1602   SPECREQUEST  07/30/2022 1602    BOTTLES DRAWN AEROBIC AND ANAEROBIC Blood Culture adequate volume   SPECREQUEST  07/30/2022 1602    BOTTLES DRAWN AEROBIC AND ANAEROBIC Blood Culture adequate volume   CULT  07/30/2022 1602    NO GROWTH 5 DAYS Performed at [facility], [address], [place], [place] [address]    CULT  07/30/2022 1602    NO GROWTH 5 DAYS Performed at [facility], [address] [place], [place] [address]    REPTSTATUS 08/04/2022 FINAL 07/30/2022 1602   REPTSTATUS 08/04/2022 FINAL 07/30/2022 1602    Cardiac Enzymes: No results for input(s): "CKTOTAL", "CKMB", "CKMBINDEX", "TROPONINI" in the last 168 hours. CBG: Recent Labs  Lab 08/09/22 2000 08/09/22 2321 08/10/22 0516 08/10/22 0728 08/10/22 1205  GLUCAP 82 126* 73 89 110*   Iron Studies: No results for input(s): "IRON", "TIBC", "TRANSFERRIN", "FERRITIN" in the last 72 hours. @lablastinr3 @ Studies/Results: No results found.  Medications:  sodium chloride      (feeding supplement) PROSource Plus  30 mL Oral BID   aspirin EC  81 mg Oral Daily   bacitracin   Topical TID   Chlorhexidine Gluconate Cloth  6 each Topical Q0600   clopidogrel  75 mg Oral Daily   divalproex  500 mg Oral Q12H   feeding supplement (NEPRO CARB STEADY)  237 mL Oral TID BM   heparin injection (subcutaneous)  5,000 Units Subcutaneous Q8H   hydrocortisone  25 mg Rectal BID   levothyroxine  250 mcg Oral Q0600   midodrine  10 mg Oral Q T,Th,Sa-HD   pantoprazole  40 mg Oral BID   QUEtiapine  25 mg Oral BID   rosuvastatin  10 mg Oral QHS   sodium chloride flush  3 mL Intravenous Q12H     Dialysis Orders: TTS at [facility] 3.5hr, 350/700, EDW 99kg, 2K, AVF, heparin 3700 units bolus + 500U/hr pump. Total dose 4700 units IV (Stop heparin pump one hour prior to end of HD) - HD stopped 7/23 due to  acute agitation, had been well prior to HD on 7/23 - Epogen 7600 units sq q HD   Assessment/Plan: 1. AMS/small pontine CVA:  Occurred Acutely during dialysis 7/23 - no clear etiology initially. No new meds. [facility] 7/24 negative. Does have baseline memory loss and occ confusion per wife, but this is much worse. Initial head CT negative except chronic brain atrophy, repeat 7/26 showed possible lesion in pons - unable to have MRI d/t spinal stimulator -> repeat CT 7/27 showed the same, felt to be small pontine CVA. Had seizure-like activity on 7/24 - started on Keppra. EEG c/w diffuse metabolic encephalopathy. Has chronic back pain with spinal stimulator and on chronic narcotics - on hold. NG tube was inserted. Now  has [place]  for Nocturnal tube feeds / Neuro following. Some improvement but does not appear back to baseline. He's not really improved this week and caused infiltration of his AVF at dialysis yesterday.  Discussed with wife today that if this is his new baseline mental status he will not be eligible for outpt HD and would need hospice.  She is already realizing that.  Per primary plan is EEG today and if no seizure noted to get palliative involved. 2. ESRD: on HD TTS - has not missed HD, BUN low - not uremic. Symptoms not improved with HD. As above mental status limiting HD treatment -- discontinued early Sat after infiltration.  Await EEG but if not improvement/reversible issue noted would not be further HD candidate and needs palliative.  Defer ordering next dialysis at this time pending that w/u.  3. HTN/volume: BP ok, volume looks ok.   Has some UOP  tolerating some UF with HD. Using midodrine pre-HD. 4. Anemia: Hgb 10.5  - continue Aranesp weekly while here. 5. Secondary hyperparathyroidism:  Ca Corec 10.2 Low PTH/PO4. Holding VDRA/binders.  6. Nutrition:Alb low  2.6. Dsy 3 diet. Add protein supplements 7. 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 stable sites   Estill Bakes MD [facility] Pager [phone]

## 2022-08-10 NOTE — Progress Notes (Signed)
Pt has 2 more bright red bloody stools with clots in moderate amount since 1900. Latest VS are: BP; 141/43(65), T: 98, HR: 79, RR: 21, O2 sat: 95% RA. Monica Becton, MD made aware with new orders made. Will continue to monitor the patient.

## 2022-08-11 DIAGNOSIS — Z66 Do not resuscitate: Secondary | ICD-10-CM

## 2022-08-11 DIAGNOSIS — R109 Unspecified abdominal pain: Secondary | ICD-10-CM

## 2022-08-11 DIAGNOSIS — R410 Disorientation, unspecified: Secondary | ICD-10-CM | POA: Diagnosis not present

## 2022-08-11 DIAGNOSIS — R569 Unspecified convulsions: Secondary | ICD-10-CM | POA: Diagnosis not present

## 2022-08-11 DIAGNOSIS — I96 Gangrene, not elsewhere classified: Secondary | ICD-10-CM | POA: Diagnosis not present

## 2022-08-11 DIAGNOSIS — Z515 Encounter for palliative care: Secondary | ICD-10-CM | POA: Diagnosis not present

## 2022-08-11 DIAGNOSIS — Z711 Person with feared health complaint in whom no diagnosis is made: Secondary | ICD-10-CM

## 2022-08-11 DIAGNOSIS — Z789 Other specified health status: Secondary | ICD-10-CM

## 2022-08-11 DIAGNOSIS — G9340 Encephalopathy, unspecified: Secondary | ICD-10-CM | POA: Diagnosis not present

## 2022-08-11 DIAGNOSIS — N186 End stage renal disease: Secondary | ICD-10-CM | POA: Diagnosis not present

## 2022-08-11 LAB — RENAL FUNCTION PANEL
Albumin: 2.6 g/dL — ABNORMAL LOW (ref 3.5–5.0)
Anion gap: 15 (ref 5–15)
BUN: 44 mg/dL — ABNORMAL HIGH (ref 8–23)
CO2: 23 mmol/L (ref 22–32)
Calcium: 8.9 mg/dL (ref 8.9–10.3)
Chloride: 95 mmol/L — ABNORMAL LOW (ref 98–111)
Creatinine, Ser: 5.47 mg/dL — ABNORMAL HIGH (ref 0.61–1.24)
GFR, Estimated: 11 mL/min — ABNORMAL LOW (ref >60)
Glucose, Bld: 86 mg/dL (ref 70–99)
Phosphorus: 4.4 mg/dL (ref 2.5–4.6)
Potassium: 4.6 mmol/L (ref 3.5–5.1)
Sodium: 133 mmol/L — ABNORMAL LOW (ref 135–145)

## 2022-08-11 LAB — GLUCOSE, CAPILLARY
Glucose-Capillary: 94 mg/dL (ref 70–99)
Glucose-Capillary: 78 mg/dL (ref 70–99)

## 2022-08-11 LAB — CBC
HCT: 33.2 % — ABNORMAL LOW (ref 39.0–52.0)
Hemoglobin: 10.2 g/dL — ABNORMAL LOW (ref 13.0–17.0)
MCH: 29.1 pg (ref 26.0–34.0)
MCHC: 30.7 g/dL (ref 30.0–36.0)
MCV: 94.9 fL (ref 80.0–100.0)
Platelets: 130 10*3/uL — ABNORMAL LOW (ref 150–400)
RBC: 3.5 MIL/uL — ABNORMAL LOW (ref 4.22–5.81)
RDW: 15.3 % (ref 11.5–15.5)
WBC: 8.5 10*3/uL (ref 4.0–10.5)
nRBC: 0 % (ref 0.0–0.2)

## 2022-08-11 MED ORDER — DIPHENHYDRAMINE HCL 50 MG/ML IJ SOLN: 12.5000 mg | INTRAMUSCULAR | Status: DC | PRN

## 2022-08-11 MED ORDER — LORAZEPAM 2 MG/ML PO CONC: 1.0000 mg | ORAL | Status: DC | PRN

## 2022-08-11 MED ORDER — SODIUM CHLORIDE 0.9% FLUSH: 3.0000 mL | INTRAVENOUS | Status: DC | PRN

## 2022-08-11 MED ORDER — GLYCOPYRROLATE 0.2 MG/ML IJ SOLN: 0.2000 mg | INTRAMUSCULAR | Status: DC | PRN

## 2022-08-11 MED ORDER — GLYCOPYRROLATE 1 MG PO TABS: 1.0000 mg | ORAL_TABLET | ORAL | Status: DC | PRN

## 2022-08-11 MED ORDER — NYSTATIN 100000 UNIT/GM EX POWD: Freq: Three times a day (TID) | CUTANEOUS | Status: DC | PRN

## 2022-08-11 MED ORDER — ACETAMINOPHEN 325 MG PO TABS: 650.0000 mg | ORAL_TABLET | Freq: Four times a day (QID) | ORAL | Status: DC | PRN

## 2022-08-11 MED ORDER — MAGIC MOUTHWASH: 15.0000 mL | Freq: Four times a day (QID) | ORAL | Status: DC | PRN

## 2022-08-11 MED ORDER — HYDROMORPHONE HCL 1 MG/ML IJ SOLN: 1.0000 mg | INTRAMUSCULAR | Status: DC | PRN

## 2022-08-11 MED ORDER — LORAZEPAM 2 MG/ML IJ SOLN
1.0000 mg | INTRAMUSCULAR | Status: DC | PRN
  Administered 2022-08-11 (×2): 1 mg via INTRAVENOUS
  Filled 2022-08-11 (×2): qty 1

## 2022-08-11 MED ORDER — SODIUM CHLORIDE 0.9% FLUSH: 3.0000 mL | Freq: Two times a day (BID) | INTRAVENOUS | Status: DC

## 2022-08-11 MED ORDER — POLYVINYL ALCOHOL 1.4 % OP SOLN: 1.0000 [drp] | Freq: Four times a day (QID) | OPHTHALMIC | Status: DC | PRN

## 2022-08-11 MED ORDER — BIOTENE DRY MOUTH MT LIQD: 15.0000 mL | OROMUCOSAL | Status: DC | PRN

## 2022-08-11 MED ORDER — ONDANSETRON 4 MG PO TBDP: 4.0000 mg | ORAL_TABLET | Freq: Four times a day (QID) | ORAL | Status: DC | PRN

## 2022-08-11 MED ORDER — HALOPERIDOL LACTATE 5 MG/ML IJ SOLN: 2.0000 mg | INTRAMUSCULAR | Status: DC | PRN

## 2022-08-11 MED ORDER — LORAZEPAM 2 MG/ML IJ SOLN: 1.0000 mg | INTRAMUSCULAR | Status: DC | PRN

## 2022-08-11 MED ORDER — HALOPERIDOL LACTATE 5 MG/ML IJ SOLN: 2.0000 mg | Freq: Four times a day (QID) | INTRAMUSCULAR | Status: DC | PRN

## 2022-08-11 MED ORDER — LORAZEPAM 1 MG PO TABS: 1.0000 mg | ORAL_TABLET | ORAL | Status: DC | PRN

## 2022-08-11 MED ORDER — HALOPERIDOL LACTATE 2 MG/ML PO CONC: 2.0000 mg | ORAL | Status: DC | PRN

## 2022-08-11 MED ORDER — HYDROMORPHONE HCL 1 MG/ML IJ SOLN
0.5000 mg | Freq: Three times a day (TID) | INTRAMUSCULAR | Status: DC
  Administered 2022-08-11: 0.5 mg via INTRAVENOUS
  Filled 2022-08-11: qty 0.5

## 2022-08-11 MED ORDER — HALOPERIDOL 1 MG PO TABS: 2.0000 mg | ORAL_TABLET | ORAL | Status: DC | PRN

## 2022-08-11 MED ORDER — VALPROATE SODIUM 100 MG/ML IV SOLN
500.0000 mg | Freq: Two times a day (BID) | INTRAVENOUS | Status: DC
  Filled 2022-08-11 (×2): qty 5

## 2022-08-11 MED ORDER — ACETAMINOPHEN 650 MG RE SUPP: 650.0000 mg | Freq: Four times a day (QID) | RECTAL | Status: DC | PRN

## 2022-08-11 MED ORDER — HYDROMORPHONE HCL 1 MG/ML IJ SOLN
1.0000 mg | INTRAMUSCULAR | Status: DC | PRN
  Administered 2022-08-11: 1 mg via INTRAVENOUS
  Filled 2022-08-11: qty 1

## 2022-08-11 MED ORDER — VALPROIC ACID 250 MG/5ML PO SOLN: 500.0000 mg | Freq: Two times a day (BID) | ORAL | Status: DC

## 2022-08-11 MED ORDER — SODIUM CHLORIDE 0.9 % IV SOLN: 250.0000 mL | INTRAVENOUS | Status: DC | PRN

## 2022-08-11 MED ORDER — BIOTENE DRY MOUTH MT LIQD: 15.0000 mL | Freq: Two times a day (BID) | OROMUCOSAL | Status: DC

## 2022-08-11 MED ORDER — ONDANSETRON HCL 4 MG/2ML IJ SOLN: 4.0000 mg | Freq: Four times a day (QID) | INTRAMUSCULAR | Status: DC | PRN

## 2022-08-11 NOTE — TOC Progression Note (Signed)
Transition of Care [facility]) - Progression Note    Patient Details  Name: Chase [name] MRN: [id number] Date of Birth: Aug 05, 1950  Transition of Care [facility]) CM/SW Contact  Mearl Latin, LCSW Phone Number: 08/11/2022, 10:04 AM  Clinical Narrative:    CSW received consult for hospice facility placement. CSW spoke with patient's spouse and discussed choice. She has selected [facility] in [place] as it is most convenient for her. CSW explained referral process and contacted Cheri with [facility] to review.    Expected Discharge Plan: Hospice Medical Facility Barriers to Discharge: Continued Medical Work up  Expected Discharge Plan and Services In-house Referral: Clinical Social Work   Post Acute Care Choice: Residential Hospice Bed Living arrangements for the past 2 months: Skilled Nursing Facility                                       Social Determinants of Health (SDOH) Interventions SDOH Screenings   Food Insecurity: No Food Insecurity (07/16/2022)  Housing: Low Risk  (07/16/2022)  Transportation Needs: No Transportation Needs (07/16/2022)  Utilities: Not At Risk (07/16/2022)  Financial Resource Strain: Low Risk  (09/16/2021)   Received from [facility], [facility]  Physical Activity: Inactive (09/16/2021)   Received from [facility], [facility]  Social Connections: Socially Integrated (09/16/2021)   Received from [facility], [facility]  Stress: Patient Declined (09/16/2021)   Received from [facility], [facility]  Tobacco Use: Low Risk  (07/29/2022)    Readmission Risk Interventions    07/31/2022    3:30 PM 07/16/2022    3:22 PM  Readmission Risk Prevention Plan  Transportation Screening Complete Complete  PCP or Specialist Appt within 5-7 Days  Complete  Home Care Screening  Complete  Medication Review (RN CM)  Complete  Medication Review (RN Care Manager) Complete   PCP or Specialist appointment within 3-5 days of discharge  Complete   HRI or Home Care Consult Complete   SW Recovery Care/Counseling Consult Complete   Palliative Care Screening Not Applicable   Skilled Nursing Facility Complete

## 2022-08-11 NOTE — Consult Note (Signed)
Consultation Note Date: 08/11/2022   Patient Name: Chase [name]  DOB: 05-14-1950  MRN: [id number]  Age / Sex: 72 y.o., male  PCP: Randel Pigg, Dorma Russell, MD Referring Physician: Maretta Bees, MD  Reason for Consultation: Establishing goals of care, Non pain symptom management, Pain control, Psychosocial/spiritual support, and Terminal Care  HPI/Patient Profile: 72 y.o. male  with past medical history of ESRD on HD TTS, CAD, hypothyroidism, hypertension, anemia of chronic disease, depression/anxiety, GERD, type 2 diabetes, chronic back and neck pain, gangrene of fingers status post amputation, HFpEF was admitted on 07/29/2022 with acute metabolic encephalopathy and abdominal pain. His hospital course has been complicated by seizure, pontine stroke, hypoglycemia, GIB with acute blood loss anemia, as well as delirium/confusion that interfered with hemodialysis. After lengthy discussions with attending, patient's wife opted for his transition to full comfort measures and to stop HD on 8/5.  Of note, he has had 3 admissions and 1 ED visit in the last 6 months.  Clinical Assessment and Goals of Care: I have reviewed medical records including EPIC notes, labs, and imaging. Received report from primary RN - no acute concerns. RN reports patient is not eating/drinking.  TOC notes indicate patient's wife has chosen for his transfer to [facility] location as she lives in [place].  Went to visit patient at bedside - no family/visitors present. Patient was lying in bed asleep - he does briefly wake to voice/gentle touch as I remove the mitts that are one his hands. No signs or non-verbal gestures of pain or discomfort noted. No respiratory distress, increased work of breathing, or secretions noted. He is ill appearing. Tele sitter in use.  Discussed with RN goals to keep mitts removed, remove  telemetry monitoring, discontinue tele sitter when appropriate.  Education provided to utilize comfort medications for safety/comfort over using Chief Operating Officer.   Primary Decision Maker: NEXT OF KIN - wife/[name] Conkel    SUMMARY OF RECOMMENDATIONS   Continue full comfort measures Continue DNR/DNI as previously documented - durable DNR form completed and placed in shadow chart. Copy was made and will be scanned into Vynca/ACP tab Transfer to [facility] location when bed available - TOC consult previously placed Added orders for EOL symptom management and to reflect full comfort measures, as well as discontinued orders that were not focused on comfort Unrestricted visitation orders were placed per current [place] E[facility] visitation policy  Nursing to provide frequent assessments and administer PRN medications as clinically necessary to ensure EOL comfort PMT will continue to follow and support holistically  Symptom Management Dilaudid PRN pain/dyspnea/increased work of breathing/RR>25 Depacon BID seizure prophylaxis  Tylenol PRN pain/fever Biotin twice daily Benadryl PRN itching Robinul PRN secretions Haldol PRN agitation/delirium Ativan PRN anxiety/seizure/sleep/distress Zofran PRN nausea/vomiting Liquifilm Tears PRN dry eye Nystatin TID affected area    Code Status/Advance Care Planning: DNR  Palliative Prophylaxis:  Aspiration, Bowel Regimen, Delirium Protocol, Frequent Pain Assessment, Oral Care, Palliative Wound Care, and Turn Reposition  Additional Recommendations (Limitations, Scope, Preferences): Full Comfort Care  Psycho-social/Spiritual:  Desire for further Chaplaincy support:no Created space and opportunity for patient to express thoughts and feelings regarding patient's current medical situation.  Emotional support and therapeutic listening provided.  Prognosis:  < 2 weeks  Discharge Planning: Hospice facility      Primary  Diagnoses: Present on Admission:  Acute metabolic encephalopathy  ESRD (end stage renal disease) (HCC)  Hypothyroidism  Acute encephalopathy  Gangrene of finger  of right hand (HCC)   I have reviewed the medical record, interviewed the patient and family, and examined the patient. The following aspects are pertinent.  Past Medical History:  Diagnosis Date   Anemia    Back pain with radiation    Coronary artery disease    Depression    Diabetes mellitus without complication (HCC)    Dialysis patient [facility])    TTS at [facility] [address] / started 2020   History of traumatic head injury    Hypertension    Thyroid disease    Social History   Socioeconomic History   Marital status: Married    Spouse name: Not on file   Number of children: Not on file   Years of education: Not on file   Highest education level: Not on file  Occupational History   Not on file  Tobacco Use   Smoking status: Never   Smokeless tobacco: Never  Substance and Sexual Activity   Alcohol use: Never   Drug use: Never   Sexual activity: Yes  Other Topics Concern   Not on file  Social History Narrative   Not on file   Social Determinants of Health   Financial Resource Strain: Low Risk  (09/16/2021)   Received from [facility], [facility]   Overall Financial Resource Strain (CARDIA)    Difficulty of Paying Living Expenses: Not very hard  Food Insecurity: No Food Insecurity (07/16/2022)   Hunger Vital Sign    Worried About Running Out of Food in the Last Year: Never true    Ran Out of Food in the Last Year: Never true  Transportation Needs: No Transportation Needs (07/16/2022)   PRAPARE - Administrator, Civil Service (Medical): No    Lack of Transportation (Non-Medical): No  Physical Activity: Inactive (09/16/2021)   Received from [facility], [facility]   Exercise Vital Sign    Days of Exercise per Week: 0 days    Minutes of Exercise per Session: 10 min  Stress:  Patient Declined (09/16/2021)   Received from [facility], [facility] - Occupational Stress Questionnaire    Feeling of Stress : Patient declined  Social Connections: Socially Integrated (09/16/2021)   Received from [facility], [facility]   Social Network    How would you rate your social network (family, work, friends)?: Good participation with social networks   History reviewed. No pertinent family history. Scheduled Meds:  antiseptic oral rinse  15 mL Topical BID   Continuous Infusions:  valproate sodium     PRN Meds:.acetaminophen **OR** acetaminophen, diphenhydrAMINE, glycopyrrolate **OR** glycopyrrolate **OR** glycopyrrolate, haloperidol **OR** haloperidol **OR** haloperidol lactate, HYDROmorphone (DILAUDID) injection, LORazepam **OR** LORazepam **OR** LORazepam, nystatin, ondansetron **OR** ondansetron (ZOFRAN) IV, polyvinyl alcohol Medications Prior to Admission:  Prior to Admission medications   Medication Sig Start Date End Date Taking? Authorizing Provider  acetaminophen (TYLENOL) 500 MG tablet Take 1,000 mg by mouth every 8 (eight) hours as needed for moderate pain or mild pain.   Yes [provider]  albuterol (VENTOLIN HFA) 108 (90 Base) MCG/ACT inhaler Inhale 2 puffs into the lungs every 4 (four) hours as needed for wheezing or shortness of breath.   Yes [provider]  aspirin EC 81 MG tablet Take 1 tablet (81 mg total) by mouth daily. Swallow whole. 07/02/22  Yes Rodolph Bong, MD  buprenorphine (SUBUTEX) 2 MG SUBL SL tablet Place 1 tablet (2 mg total)  under the tongue at bedtime. 07/01/22  Yes Rodolph Bong, MD  busPIRone (BUSPAR) 10 MG tablet Take 1-2 tablets (10-20 mg total) by mouth See admin instructions. Take 2 tablets in the AM and 1 tablet in the PM. Patient taking differently: Take 10-20 mg by mouth 2 (two) times daily. Take 2 tablets by mouth in the morning and 1 tablet in the at bedtime  07/01/22  Yes Rodolph Bong, MD  calcitRIOL (ROCALTROL) 0.25 MCG capsule Take 0.25 mcg by mouth daily.   Yes [provider]  Calcium Acetate 667 MG TABS Take 1 tablet by mouth 3 (three) times daily.   Yes [provider]  clopidogrel (PLAVIX) 75 MG tablet Take 1 tablet (75 mg total) by mouth daily. 07/02/22  Yes Rodolph Bong, MD  Darbepoetin Alfa (ARANESP) 100 MCG/0.5ML SOSY injection Inject 0.5 mLs (100 mcg total) into the skin every Thursday at 6pm. 07/03/22  Yes Rodolph Bong, MD  escitalopram (LEXAPRO) 20 MG tablet Take 20 mg by mouth daily.   Yes [provider]  hydrOXYzine (ATARAX) 10 MG tablet Take 10 mg by mouth 3 (three) times daily as needed for anxiety.   Yes [provider]  levothyroxine (SYNTHROID) 125 MCG tablet Take 250 mcg by mouth daily before breakfast.   Yes [provider]  lidocaine (LMX) 4 % cream Apply 1 Application topically once a week. Apply to fistula  prior to Dialysis- Tue, Thurs, Sat   Yes [provider]  melatonin 3 MG TABS tablet Take 3 mg by mouth at bedtime.   Yes [provider]  midodrine (PROAMATINE) 10 MG tablet Take 1 tablet (10 mg total) by mouth Every Tuesday,Thursday,and Saturday with dialysis. Patient taking differently: Take 10 mg by mouth Every Tuesday,Thursday,and Saturday with dialysis. May also give 1 tablet for sBP less than 90 07/03/22  Yes Rodolph Bong, MD  oxyCODONE (OXY IR/ROXICODONE) 5 MG immediate release tablet Take 1 tablet (5 mg total) by mouth every 4 (four) hours as needed for moderate pain. 07/18/22  Yes Pahwani, Daleen Bo, MD  pantoprazole (PROTONIX) 40 MG tablet Take 40 mg by mouth 2 (two) times daily. For GERD   Yes [provider]  rosuvastatin (CRESTOR) 10 MG tablet Take 10 mg by mouth at bedtime.   Yes [provider]   Allergies  Allergen Reactions   Gabapentin Other (See Comments)    Cognitive impairment per spouse   Lipitor  [Atorvastatin] Other (See Comments)    Severe muscle cramps per spouse   Nsaids Other (See Comments)    Severe renal impairment per [facility]   Pregabalin Other (See Comments)    Cognitive impairment. Dizziness.   Review of Systems  Unable to perform ROS: Acuity of condition    Physical Exam Vitals and nursing note reviewed.  Constitutional:      General: He is not in acute distress.    Appearance: He is ill-appearing.  Pulmonary:     Effort: No respiratory distress.  Skin:    General: Skin is warm and dry.  Neurological:     Mental Status: He is lethargic.     Motor: Weakness present.     Vital Signs: BP 104/62 (BP Location: Right Leg)   Pulse 84   Temp (!) 97.2 F (36.2 C) (Oral)   Resp 15   Ht 5\' 6"  (1.676 m)   Wt 76.6 kg   SpO2 93%   BMI 27.26 kg/m  Pain Scale: PAINAD   Pain  Score: 0-No pain   SpO2: SpO2: 93 % O2 Device:SpO2: 93 % O2 Flow Rate: .O2 Flow Rate (L/min): 2 L/min  IO: Intake/output summary:  Intake/Output Summary (Last 24 hours) at 08/11/2022 1040 Last data filed at 08/10/2022 2026 Gross per 24 hour  Intake 163 ml  Output 500 ml  Net -337 ml    LBM: Last BM Date : 08/11/22 Baseline Weight: Weight: 90.7 kg Most recent weight: Weight: 76.6 kg     Palliative Assessment/Data: PPS 10%     Signed by: Haskel Khan, NP   Please contact Palliative Medicine Team phone at [phone] for questions and concerns.  For individual provider: See Amion  *Portions of this note are a verbal dictation therefore any spelling and/or grammatical errors are due to the "Dragon Medical One" system interpretation.

## 2022-08-11 NOTE — Progress Notes (Signed)
Report called to Okey Regal, RN at [facility] in [facility]

## 2022-08-11 NOTE — Plan of Care (Signed)
  Problem: Education: Goal: Knowledge of General Education information will improve Description: Including pain rating scale, medication(s)/side effects and non-pharmacologic comfort measures Outcome: Progressing   Problem: Health Behavior/Discharge Planning: Goal: Ability to manage health-related needs will improve Outcome: Progressing   Problem: Coping: Goal: Level of anxiety will decrease Outcome: Progressing   Problem: Elimination: Goal: Will not experience complications related to bowel motility Outcome: Progressing   Problem: Safety: Goal: Non-violent Restraint(s) Outcome: Progressing

## 2022-08-11 NOTE — Progress Notes (Signed)
Nutrition Brief Note  Chart reviewed. Pt has now transitioned to comfort care.   No further nutrition interventions planned at this time. Please re-consult as needed.   Hermina Barters RD, LDN Clinical Dietitian See Shea Evans for contact information.

## 2022-08-11 NOTE — Progress Notes (Signed)
Had to be more bloody bowel movements overnight-Hb slowly trending down He remains agitated and confused this morning EEG 8/4 negative Discussed with neurologist-Dr Xu-no further workup-treatment adjustment recommended-really no other options. He unfortunately has not been able to tolerate HD well given his mental status (see nephrology note from 8/4) I reached out to the patient's spouse-Chase [name] this morning-we had briefly spoken yesterday-and had discussed some end-of-life issues-she realizes that he is suffering at this point-and does not want him to suffer any further.  She is okay with stopping HD and transitioning to full comfort measures.  She lives in [place]-and request [facility] for residential hospice placement. Palliative care consult pending Will consult social work for hospice referral. In the interim-will stop most of his medications and transition him to full hospice care/comfort care.

## 2022-08-11 NOTE — TOC Transition Note (Signed)
Transition of Care [facility]) - CM/SW Discharge Note   Patient Details  Name: Chase [name] MRN: [id number] Date of Birth: 03/18/1950  Transition of Care [facility]) CM/SW Contact:  Mearl Latin, LCSW Phone Number: 08/11/2022, 3:24 PM   Clinical Narrative:    Patient will DC to: [facility] Anticipated DC date: 08/11/22 Family notified: Spouse Transport by: Sharin Mons   Per MD patient ready for DC to Hospice. RN to call report prior to discharge [phone]). RN, patient, patient's family, and facility notified of DC. Discharge Summary sent to facility. DC packet on chart including signed DNR. Ambulance transport requested for patient.   CSW will sign off for now as social work intervention is no longer needed. Please consult [place] again if new needs arise.     Final next level of care: Hospice Medical Facility Barriers to Discharge: Barriers Resolved   Patient Goals and CMS Choice CMS Medicare.gov Compare Post Acute Care list provided to:: Patient Represenative (must comment) Choice offered to / list presented to : Spouse  Discharge Placement                Patient [facility] bed at:  [facility])   Name of family member notified: Aurther Loft Patient and family notified of of transfer: 08/11/22  Discharge Plan and Services Additional resources added to the After Visit Summary for   In-house Referral: Clinical Social Work   Post Acute Care Choice: Residential Hospice Bed                               Social Determinants of Health (SDOH) Interventions SDOH Screenings   Food Insecurity: No Food Insecurity (07/16/2022)  Housing: Low Risk  (07/16/2022)  Transportation Needs: No Transportation Needs (07/16/2022)  Utilities: Not At Risk (07/16/2022)  Financial Resource Strain: Low Risk  (09/16/2021)   Received from [facility], [facility]  Physical Activity: Inactive (09/16/2021)   Received from [facility], [facility]   Social Connections: Socially Integrated (09/16/2021)   Received from [facility], [facility]  Stress: Patient Declined (09/16/2021)   Received from [facility], [facility]  Tobacco Use: Low Risk  (07/29/2022)     Readmission Risk Interventions    07/31/2022    3:30 PM 07/16/2022    3:22 PM  Readmission Risk Prevention Plan  Transportation Screening Complete Complete  PCP or Specialist Appt within 5-7 Days  Complete  Home Care Screening  Complete  Medication Review (RN CM)  Complete  Medication Review (RN Care Manager) Complete   PCP or Specialist appointment within 3-5 days of discharge Complete   HRI or Home Care Consult Complete   SW Recovery Care/Counseling Consult Complete   Palliative Care Screening Not Applicable   Skilled Nursing Facility Complete

## 2022-08-11 NOTE — Progress Notes (Signed)
Pt discharged via [facility] going to hospice facility. Family at bedside. VS taken. Pt is asleep and family doesn't want to awaken him up. PIV was left in place as requested by the family and was confirmed by the receiving facility to leave it on.

## 2022-08-11 NOTE — Discharge Summary (Signed)
PATIENT DETAILS Name: Chase [name] Age: 72 y.o. Sex: male Date of Birth: 1950-12-24 MRN: [id number]. Admitting Physician: Joseph Art, DO [id number] Alinda Dooms, MD  Admit Date: 07/29/2022 Discharge date: 08/11/2022  Recommendations for Outpatient Follow-up:  Optimize comfort care  Admitted From:  Home  Disposition: Hospice care   Discharge Condition: worsening  CODE STATUS:   Code Status: DNR   Diet recommendation:  Diet Order             Diet regular Room service appropriate? Yes; Fluid consistency: Thin  Diet effective now           Diet - low sodium heart healthy                    Brief Summary: Patient is a 72 y.o.  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  Evaluated by stroke MD-started on DAPT-unfortunately hospital course complicated by delirium/confusion-interfering with hemodialysis, and lower GI bleeding with acute blood loss anemia.  After extensive discussion with spouse-patient was transitioned to full comfort measures on 8/5-see below for further details.   Significant events: 7/23>> admit to [facility] 7/24>> seizure-like activity-neuro consult. 8/04>> multiple episodes of hematochezia-slight drop in hemoglobin-ASA/Plavix discontinued.  Discussion with wife-poor prognosis-mental status preventing safe hemodialysis.  Ongoing goals of care-DNR placed. 8/05>> multiple episodes of hematochezia overnight-Long discussion with spouse-transitioning to comfort measures.   Significant studies: 7/23>> CT head: No acute intracranial abnormality 7/23>> CT abdomen/pelvis: Stable intra/extrahepatic biliary ductal dilatation-with hyperdense material in the CBD-compatible with choledocholithiasis.  Left lower abdominal wall intramuscular hematoma has  decreased in size. 7/23>> RUQ ultrasound: Cholecystectomy-otherwise unremarkable RUQ ultrasound. 7/24>> CT head: No acute finding. 7/24-7/25>> LTM EEG: No definite seizures were seen. 7/26>> CT head: Focal hypodense lesion in the left hemipons-possible age-indeterminate CVA 7/26>> echo: EF 60-65% 7/27>> CT head: Redemonstrated hypoattenuation in the left pons consistent with age-indeterminate infarct.   Significant microbiology data: 7/24>> blood culture: No growth 7/24>> COVID PCR: Negative   Procedures: None    Consults: Neurology Nephrology Phone consult-gastroenterology Palliative care  Brief Hospital Course: Seizure disorder Found to have seizure-like activity on 7/24 Was started on Depakote Seizures likely triggered by acute CVA Due to persistent encephalopathy-preventing safe HD-and now worsening lower GI bleed-after extensive discussion with family-transition to full comfort measures.  Will switch to IV Depakote for comfort for now.   Acute metabolic encephalopathy Felt to be multifactorial (polypharmacy/seizures/CVA/delirium all contributing)-neuroimaging as above-unfortunately unable to obtain MRI-due to spinal stimulator Although somewhat improved compared to how he first presented-his improvement has plateaued-and unfortunately continues to be confused.  Per nephrology-unable to perform long-term HD in this scenario.  Repeat EEG on 8/4 negative for seizures.  Discussed with neurologist Dr. Cherylann Ratel further intervention/treatment regimen can be offered. Recent ammonia level/TSH/B12/RPR all negative After extensive discussion with spouse over the past several days-since long-term dialysis is not going to be an option due to mental status-and now with lower GI bleeding-she has elected to transition to full comfort measures.   Acute CVA Workup as above Was on aspirin/Plavix-but was discontinued due to lower GI bleeding-with no plans to resume since patient has  been transitioned to full comfort measures.   Lower GI bleeding with acute blood loss anemia Slight drop in hemoglobin-but does not require PRBC transfusion DAPT discontinued 8/4 CT angiogram  negative for active bleeding Given mental status-very difficult to proceed with colonoscopy etc. GI MD was consulted but have canceled consultation as patient now comfort care. After extensive discussion with spouse Aurther Loft over the phone on 8/4 and then again on 8/5-will plan to transition to comfort measures.   Choledocholithiasis Patient is s/p cholecystectomy-and has had numerous choledocholithiasis requiring numerous ERCP in the past (x 5 total-last 2 years back) His CT abdomen on admission shows choledocholithiasis-thankfully patient is asymptomatic-LFTs are stable.  Appreciate brief GI note on 8/1-no reason to pursue inpatient ERCP-recommendations are to follow-up with primary GI MD at [facility].     Hypoglycemia Continues to have borderline hypoglycemia-but otherwise stable No plans to check CBGs as patient has been transitioned to full comfort measures.  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 Previously was on PD-PD catheter was removed on 6/24   Recent history of abdominal wall hematoma at the site of PD catheter removal CT abdomen on admission shows continued evolution of hematoma-which has now decreased in size.   Hypothyroidism No longer on Synthroid-comfort measures   PAD No longer on antiplatelets-comfort measures   Debility/deconditioning Failure to thrive syndrome PT/OT eval-SNF recommended-now that we plan to initiate comfort measures-residential hospice planned. Patient pulled out NG tube 7/30   Palliative care Given overall issues-see above-poor  prognosis-no improvement in mental status in spite of being in the hospital for close to 2 weeks.  Mental status has interfered with hemodialysis while hospitalized.  Long discussion with neurologist-unfortunately-no further intervention/medication changes recommended-he will likely have some amount of delirium/confusion going forward.  After extensive discussion with spouse-she understands poor prognosis-and feels that the patient is suffering and does not want any further aggressive care.  We have transition to full comfort measures starting 8/5.  Residential hospice planned.  Palliative consulted-social work consulted for placement.  Nutrition Status: Nutrition Problem: Inadequate oral intake Etiology: inability to eat Signs/Symptoms: other (comment) (mental status) Interventions: Refer to RD note for recommendations, Education   BMI: Estimated body mass index is 27.26 kg/m as calculated from the following:   Height as of this encounter: 5\' 6"  (1.676 m).   Weight as of this encounter: 76.6 kg.    Discharge Diagnoses:  Principal Problem:   Acute encephalopathy Active Problems:   ESRD (end stage renal disease) (HCC)   Diabetes mellitus (HCC)   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)   Hypothyroidism   Acute metabolic encephalopathy   Seizure [facility])   Discharge Instructions:  Activity:  As tolerated with Full fall precautions use walker/cane & assistance as needed  Discharge Instructions     Diet - low sodium heart healthy   Complete by: As directed    Increase activity slowly   Complete by: As directed    No wound care   Complete by: As directed       Allergies as of 08/11/2022       Reactions   Gabapentin Other (See Comments)   Cognitive impairment per spouse   Lipitor [atorvastatin] Other (See Comments)   Severe muscle cramps per spouse   Nsaids Other (See Comments)   Severe renal impairment per [facility]   Pregabalin Other (See Comments)   Cognitive impairment.  Dizziness.        Medication List     STOP taking these medications  acetaminophen 500 MG tablet Commonly known as: TYLENOL   albuterol 108 (90 Base) MCG/ACT inhaler Commonly known as: VENTOLIN HFA   aspirin EC 81 MG tablet   buprenorphine 2 MG Subl SL tablet Commonly known as: SUBUTEX   busPIRone 10 MG tablet Commonly known as: BUSPAR   calcitRIOL 0.25 MCG capsule Commonly known as: ROCALTROL   Calcium Acetate 667 MG Tabs   clopidogrel 75 MG tablet Commonly known as: PLAVIX   Darbepoetin Alfa 100 MCG/0.5ML Sosy injection Commonly known as: ARANESP   escitalopram 20 MG tablet Commonly known as: LEXAPRO   hydrOXYzine 10 MG tablet Commonly known as: ATARAX   levothyroxine 125 MCG tablet Commonly known as: SYNTHROID   lidocaine 4 % cream Commonly known as: LMX   melatonin 3 MG Tabs tablet   midodrine 10 MG tablet Commonly known as: PROAMATINE   oxyCODONE 5 MG immediate release tablet Commonly known as: Oxy IR/ROXICODONE   pantoprazole 40 MG tablet Commonly known as: PROTONIX   rosuvastatin 10 MG tablet Commonly known as: CRESTOR       TAKE these medications    haloperidol lactate 5 MG/ML injection Commonly known as: HALDOL Inject 0.4 mLs (2 mg total) into the vein every 6 (six) hours as needed (or delirium).   HYDROmorphone 1 MG/ML injection Commonly known as: DILAUDID Inject 1 mL (1 mg total) into the vein every 2 (two) hours as needed for severe pain or moderate pain (respiratory rate >25, distress, increased work of breathing).   LORazepam 2 MG/ML concentrated solution Commonly known as: ATIVAN Place 0.5 mLs (1 mg total) under the tongue every hour as needed for anxiety, seizure or sleep (restlessness).   valproic acid 250 MG/5ML solution Commonly known as: DEPAKENE Take 10 mLs (500 mg total) by mouth 2 (two) times daily.        Follow-up Information     Randel Pigg, Dorma Russell, MD Follow up.   Specialty: Family Medicine Why: As  needed Contact information: [address] [place] [address] [phone]                Allergies  Allergen Reactions   Gabapentin Other (See Comments)    Cognitive impairment per spouse   Lipitor [Atorvastatin] Other (See Comments)    Severe muscle cramps per spouse   Nsaids Other (See Comments)    Severe renal impairment per [facility]   Pregabalin Other (See Comments)    Cognitive impairment. Dizziness.     Other Procedures/Studies: CT Angio Abd/Pel w/ and/or w/o  Result Date: 08/10/2022 CLINICAL DATA:  Lower gastrointestinal hemorrhage EXAM: CTA ABDOMEN AND PELVIS WITHOUT AND WITH CONTRAST TECHNIQUE: Multidetector CT imaging of the abdomen and pelvis was performed using the standard protocol during bolus administration of intravenous contrast. Multiplanar reconstructed images and MIPs were obtained and reviewed to evaluate the vascular anatomy. RADIATION DOSE REDUCTION: This exam was performed according to the departmental dose-optimization program which includes automated exposure control, adjustment of the mA and/or kV according to patient size and/or use of iterative reconstruction technique. CONTRAST:  75mL OMNIPAQUE IOHEXOL 350 MG/ML SOLN COMPARISON:  07/29/2022 FINDINGS: VASCULAR Aorta: Extensive mixed atherosclerotic calcification. No aortic aneurysm. No periaortic inflammatory change or fluid collection. Celiac: A non flow limiting thin septum is seen at the origin of the celiac axis, best seen on axial image # 72/10 and sagittal image # 95/14. Distally, the by a cell is widely patent without aneurysm or dissection. There is mural irregularity involving the proximal common hepatic artery  and splenic artery likely related to atherosclerotic disease. Left gastric artery demonstrates variant anatomy with separate origin from the aorta. SMA: Soft plaque within the proximal superior mesenteric artery results in a 50-75% stenosis of the vessel. Distally, the vessel is  widely patent without aneurysm or dissection. Renals: Status post left nephrectomy. Right renal artery is diffusely of narrow caliber but demonstrates a 50-75% stenosis at its origin. IMA: 50% stenosis at the origin.  Distally widely patent. Inflow: Patent without evidence of aneurysm, dissection, vasculitis or significant stenosis. Proximal Outflow: Bilateral common femoral and visualized portions of the superficial and profunda femoral arteries are patent without evidence of aneurysm, dissection, vasculitis or significant stenosis. Veins: Unremarkable Review of the MIP images confirms the above findings. 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 Associated rounded atelectasis involving the left lower lobe adjacent to this collection again noted. Extensive multi-vessel coronary artery calcification. Mild global cardiomegaly. Hepatobiliary: Status post cholecystectomy. There is intra and extrahepatic biliary ductal dilation again identified with heterogeneous intraluminal material within the dilated biliary tree representing either debris, calculi, or less commonly, mucin if there is a history of intraductal papillary neoplasm. This appears similar to prior examination. The liver is otherwise unremarkable. Pancreas: Unremarkable Spleen: Unremarkable Adrenals/Urinary Tract: The adrenal glands are unremarkable. Status post left nephrectomy. Multiple simple and hyperdense cortical cysts are seen within the right kidney for which no follow-up imaging is recommended. Rim calcified endophytic lesion within the interpolar region of the right kidney at axial image # 90/17 demonstrates marginal calcification but no significant internal enhancement likely represents an involuted cyst. Stable 12 mm nonobstructing calculus within the right renal pelvis. No hydronephrosis. The bladder is largely decompressed. Superimposed mild  perivesicular inflammatory stranding is seen circumferentially which may reflect a superimposed infectious or inflammatory cystitis. Stomach/Bowel: Surgical changes of partial gastrectomy and partial small-bowel resection are identified. No evidence of obstruction. Severe descending and sigmoid colonic diverticulosis without superimposed acute inflammatory change. No active gastrointestinal hemorrhage. The stomach, small bowel, and large bowel are otherwise unremarkable. Appendix normal. No free intraperitoneal gas or fluid. Lymphatic: No pathologic adenopathy within the abdomen and pelvis. Reproductive: Prostate is unremarkable. Other: Stable 17 x 30 mm loculated fluid collection within the left mid abdomen immediately subjacent to the left rectus sheath, compatible with a chronic hematoma. No abdominal wall hernia. Musculoskeletal: No acute bone abnormality. Stable remote L2 compression deformity. Osseous structures are diffusely osteopenic. Dorsal column stimulator device again noted. IMPRESSION: 1. No active gastrointestinal hemorrhage. 2. Extensive multi-vessel coronary artery calcification. 3. 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 4. 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 5. 50% stenosis of the proximal inferior mesenteric artery. 6. Mild perivesicular inflammatory stranding may reflect a superimposed infectious or inflammatory cystitis. Correlation with urinalysis may be helpful. 7. 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 Associated rounded atelectasis involving the left lower lobe adjacent to this collection again noted. 8. Stable intra and extrahepatic biliary ductal dilation with heterogeneous intraluminal material within the dilated biliary tree representing either debris, calculi, or less commonly, mucin if there is a history of intraductal papillary neoplasm. Correlation with surgical history and  ERCP or MRCP examination may be helpful for further evaluation. 9. Stable 12 mm nonobstructing calculus within the right renal pelvis. No hydronephrosis. 10. Severe descending and sigmoid colonic diverticulosis without superimposed acute inflammatory change. 11. Stable 17 x 30 mm loculated fluid collection within  the left mid abdomen immediately subjacent to the left rectus sheath, compatible with a chronic hematoma. Aortic Atherosclerosis (ICD10-I70.0). Electronically Signed   By: Helyn Numbers M.D.   On: 08/10/2022 20:24   EEG adult  Result Date: 08/10/2022 Windell Norfolk, MD     08/10/2022  6:03 PM Patient Name: Chase [name] MRN: [id number] Epilepsy Attending: Windell Norfolk Referring Physician/Provider: Jerral Ralph Date: 08/10/2022 Duration: 23 minutes Patient history: 72 year old man with continued confusion, EEG to evaluate for seizure Level of alertness: Awake, confused AEDs during EEG study: Technical aspects: This EEG study was done with scalp electrodes positioned according to the 10-20 International system of electrode placement. Electrical activity was reviewed with band pass filter of 1-70Hz , sensitivity of 7 uV/mm, display speed of 63mm/sec with a 60Hz  notched filter applied as appropriate. EEG data were recorded continuously and digitally stored.  Video monitoring was available and reviewed as appropriate. Description: The posterior dominant rhythm consists of 9-10 Hz activity of moderate voltage (25-35 uV) seen predominantly in posterior head regions, symmetric and reactive to eye opening and eye closing. Drowsiness was characterized by attenuation of the posterior background rhythm. Sleep was characterized by vertex waves, sleep spindles (12 to 14 Hz), maximal frontocentral region.  There is an excessive amount of 15 to 18 Hz, 2-3 uV beta activity with irregular morphology distributed symmetrically and diffusely. EEG showed continuous generalized polymorphic sharply contoured 3 to 6 Hz  theta-delta slowing. Sleep was not seen.  Hyperventilation and photic stimulation were not performed. There were no seizure or epileptiform discharges seen during this recording. ABNORMALITY - Continuous slow, generalized IMPRESSION: This study is suggestive of mild to moderate diffuse encephalopathy, nonspecific etiology but likely related to sedation, toxic-metabolic etiology. There were no seizures or epileptiform discharges seen during this recording. Amadou Camara   CT HEAD WO CONTRAST ( )  Result Date: 08/02/2022 CLINICAL DATA:  Stroke follow-up EXAM: CT HEAD WITHOUT CONTRAST TECHNIQUE: Contiguous axial images were obtained from the base of the skull through the vertex without intravenous contrast. RADIATION DOSE REDUCTION: This exam was performed according to the departmental dose-optimization program which includes automated exposure control, adjustment of the mA and/or kV according to patient size and/or use of iterative reconstruction technique. COMPARISON:  CT head 08/01/2022 FINDINGS: Brain: No intracranial hemorrhage or mass effect. Redemonstrated hypoattenuation in the left pons consistent with age-indeterminate infarct (series 3/image 12-13. No hydrocephalus. No extra-axial fluid collection. Cerebral atrophy and ill-defined hypoattenuation within the cerebral white matter consistent with chronic small vessel ischemic disease. Chronic left cerebellar infarct. Vascular: No hyperdense vessel. Intracranial arterial calcification. Skull: No fracture or focal lesion. Sinuses/Orbits: No acute finding. Paranasal sinuses and mastoid air cells are well aerated. Other: None. IMPRESSION: Redemonstrated hypoattenuation in the left pons consistent with age-indeterminate infarct. No intracranial hemorrhage. Electronically Signed   By: Minerva Fester M.D.   On: 08/02/2022 20:45   ECHOCARDIOGRAM COMPLETE  Result Date: 08/01/2022    ECHOCARDIOGRAM REPORT   Patient Name:   Chase [name] Date of Exam:  08/01/2022 Medical Rec #:  [id number]             Height:       66.0 in Accession #:    [id number]            Weight:       190.0 lb Date of Birth:  Oct 10, 1950             BSA:          1.957 m Patient  Age:    71 years              BP:           125/79 mmHg Patient Gender: M                     HR:           75 bpm. Exam Location:  Inpatient Procedure: 2D Echo, Cardiac Doppler and Color Doppler Indications:    Atrial Fibrillation I48.91  History:        Patient has no prior history of Echocardiogram examinations.                 CAD; Risk Factors:Hypertension and Diabetes. ESRD.  Sonographer:    Lucendia Herrlich Referring Phys: [id number] DEBBY CROSLEY  Sonographer Comments: Image acquisition challenging due to patient behavioral factors. IMPRESSIONS  1. Left ventricular ejection fraction, by estimation, is 60 to 65%. The left ventricle has normal function. The left ventricle has no regional wall motion abnormalities. There is moderate concentric left ventricular hypertrophy. Left ventricular diastolic parameters are indeterminate.  2. Right ventricular systolic function is normal. The right ventricular size is normal.  3. Trivial mitral valve regurgitation.  4. Aortic valve regurgitation is not visualized. Aortic valve sclerosis/calcification is present, without any evidence of aortic stenosis. FINDINGS  Left Ventricle: Left ventricular ejection fraction, by estimation, is 60 to 65%. The left ventricle has normal function. The left ventricle has no regional wall motion abnormalities. The left ventricular internal cavity size was normal in size. There is  moderate concentric left ventricular hypertrophy. Left ventricular diastolic parameters are indeterminate. Right Ventricle: The right ventricular size is normal. Right vetricular wall thickness was not assessed. Right ventricular systolic function is normal. Left Atrium: Left atrial size was normal in size. Right Atrium: Right atrial size was normal in size. Pericardium:  There is no evidence of pericardial effusion. Mitral Valve: There is mild thickening of the mitral valve leaflet(s). Mild to moderate mitral annular calcification. Trivial mitral valve regurgitation. Tricuspid Valve: The tricuspid valve is normal in structure. Tricuspid valve regurgitation is trivial. Aortic Valve: Aortic valve regurgitation is not visualized. Aortic valve sclerosis/calcification is present, without any evidence of aortic stenosis. Aortic valve peak gradient measures 15.4 mmHg. Pulmonic Valve: The pulmonic valve was normal in structure. Pulmonic valve regurgitation is not visualized. Aorta: The aortic root and ascending aorta are structurally normal, with no evidence of dilitation. IAS/Shunts: No atrial level shunt detected by color flow Doppler.  LEFT VENTRICLE PLAX 2D LVIDd:         4.90 cm   Diastology LVIDs:         3.50 cm   LV e' medial:    10.10 cm/s LV PW:         1.50 cm   LV E/e' medial:  14.8 LV IVS:        1.60 cm   LV e' lateral:   8.39 cm/s LVOT diam:     2.20 cm   LV E/e' lateral: 17.8 LV SV:         90 LV SV Index:   46 LVOT Area:     3.80 cm  RIGHT VENTRICLE         IVC TAPSE (M-mode): 1.8 cm  IVC diam: 2.70 cm LEFT ATRIUM           Index        RIGHT ATRIUM  Index LA diam:      4.80 cm 2.45 cm/m   RA Area:     15.40 cm LA Vol (A2C): 61.8 ml 31.58 ml/m  RA Volume:   28.80 ml  14.72 ml/m LA Vol (A4C): 59.3 ml 30.30 ml/m  AORTIC VALVE AV Area (Vmax): 2.36 cm AV Vmax:        196.00 cm/s AV Peak Grad:   15.4 mmHg LVOT Vmax:      121.67 cm/s LVOT Vmean:     83.367 cm/s LVOT VTI:       0.237 m  AORTA Ao Root diam: 3.40 cm Ao Asc diam:  3.60 cm MITRAL VALVE                TRICUSPID VALVE MV Area (PHT): 7.99 cm     TR Peak grad:   26.2 mmHg MV Decel Time: 95 msec      TR Vmax:        256.00 cm/s MV E velocity: 149.00 cm/s                             SHUNTS                             Systemic VTI:  0.24 m                             Systemic Diam: 2.20 cm Dietrich Pates MD  Electronically signed by Dietrich Pates MD Signature Date/Time: 08/01/2022/3:04:04 PM    Final    DG Abd Portable 1V  Result Date: 08/01/2022 CLINICAL DATA:  NG tube placement. EXAM: PORTABLE ABDOMEN - 1 VIEW COMPARISON:  KUB 07/28/2019, CT abdomen/pelvis 07/29/2022 FINDINGS: The enteric catheter tip is in the region of the pylorus. There is a nonobstructive bowel gas pattern in the imaged abdomen. A spinal stimulator is noted. IMPRESSION: Enteric catheter tip in the region of the pylorus. Electronically Signed   By: Lesia Hausen M.D.   On: 08/01/2022 13:01   CT HEAD WO CONTRAST ( )  Result Date: 08/01/2022 CLINICAL DATA:  Mental status change, unknown cause EXAM: CT HEAD WITHOUT CONTRAST TECHNIQUE: Contiguous axial images were obtained from the base of the skull through the vertex without intravenous contrast. RADIATION DOSE REDUCTION: This exam was performed according to the departmental dose-optimization program which includes automated exposure control, adjustment of the mA and/or kV according to patient size and/or use of iterative reconstruction technique. COMPARISON:  CT head 07/30/22, 12/15/21 FINDINGS: Brain: No evidence of acute infarction, hemorrhage, hydrocephalus, extra-axial collection or mass lesion/mass effect. There is sequela of moderate chronic microvascular ischemic change. 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 Possible focal hypodense lesion in the left hemi pons (series 3, image 12-10). Vascular: No hyperdense vessel or unexpected calcification. There are dense calcifications in the V4 segments of bilateral vertebral arteries. Skull: Normal. Negative for fracture or focal lesion. Sinuses/Orbits: No middle ear or mastoid effusion. Paranasal sinuses are clear. Bilateral lens replacement. Orbits are otherwise unremarkable. Other: None. IMPRESSION: Possible focal hypodense lesion in the left hemi pons, which could represent an  age-indeterminate infarct. Consider further evaluation with brain MRI if this is a clinical concern. These results will be called to the ordering clinician or representative by the Radiologist Assistant, and communication documented in the PACS or [employer].  Electronically Signed   By: Lorenza Cambridge M.D.   On: 08/01/2022 09:13   DG Hand 2 View Right  Result Date: 07/31/2022 CLINICAL DATA:  Right hand pain. EXAM: RIGHT HAND - 2 VIEW COMPARISON:  Right hand radiographs 07/15/2022 FINDINGS: Interval amputation of the fourth finger to the distal shaft of the proximal phalanx. Interval amputation of the index finger to the proximal shaft of the middle phalanx. The resection margins are within normal limits given the recent surgery since 07/15/2022. Note is made that the soft tissues of the fourth digit were atrophic distally. High-grade extensive vascular calcifications are again seen. 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 IMPRESSION: 1. Interval amputation of the fourth finger to the distal shaft of the proximal phalanx. 2. Interval amputation of the index finger to the proximal shaft of the middle phalanx. 3. High-grade extensive vascular calcifications. Electronically Signed   By: Neita Garnet M.D.   On: 07/31/2022 10:07   Overnight EEG with video  Result Date: 07/31/2022 Charlsie Quest, MD     08/01/2022  9:29 AM Patient Name: Anup Lorenz MRN: [id number] Epilepsy Attending: Charlsie Quest Referring Physician/Provider: [name], MD  Duration: 07/30/2022 1337 to 07/31/2022 2100 Patient history:  72 y.o. male with history of traumatic head injury presented to the ED from dialysis complaining of abdominal pain for 1 day 7/23.  On admission patient was alert and  oriented to self only not at baseline per wife.  7/24 patient had a witnessed seizure described as right-sided weakness, right facial droop. EEG to evaluate for seizure Level of alertness:  lethargic AEDs during EEG study: LEV Technical aspects: This EEG study was done with scalp electrodes positioned according to the 10-20 International system of electrode placement. Electrical activity was reviewed with band pass filter of 1-70Hz , sensitivity of 7 uV/mm, display speed of 39mm/sec with a 60Hz  notched filter applied as appropriate. EEG data were recorded continuously and digitally stored.  Video monitoring was available and reviewed as appropriate. Description: EEG showed continuous generalized polymorphic sharply contoured 3 to 6 Hz theta-delta slowing, at times with triphasic morphology. Hyperventilation and photic stimulation were not performed.  EEG was disconnected between 07/30/2022 at 1637 to 1844  and 07/31/2022 1433 to 1928 due to technical reasons. ABNORMALITY - Continuous slow, generalized IMPRESSION: This study is suggestive of moderate to severe diffuse encephalopathy, nonspecific etiology but likely related to toxic-metabolic causes. No seizures or definite epileptiform discharges were seen throughout the recording. Priyanka Annabelle Harman   CT HEAD WO CONTRAST ( )  Result Date: 07/30/2022 CLINICAL DATA:  Neuro deficit, acute, stroke suspected. " Right-sided weakness, not localizing to that side but is to the left side." EXAM: CT HEAD WITHOUT CONTRAST TECHNIQUE: Contiguous axial images were obtained from the base of the skull through the vertex without intravenous contrast. RADIATION DOSE REDUCTION: This exam was performed according to the departmental dose-optimization program which includes automated exposure control, adjustment of the mA and/or kV according to patient size and/or use of iterative reconstruction technique. COMPARISON:  07/29/2022 FINDINGS: Brain: Advanced brain atrophy. No focal  abnormality seen affecting the brainstem. No focal cerebellar finding. Cerebral hemispheres show chronic small-vessel ischemic changes of the white matter. No sign of acute infarction. No mass, hemorrhage, hydrocephalus or extra-axial collection. Vascular: There is atherosclerotic calcification of the major vessels at the  base of the brain. Skull: Negative Sinuses/Orbits: Clear/normal Other: None IMPRESSION: No acute CT finding. Advanced brain atrophy. Chronic small-vessel ischemic changes of the cerebral hemispheric white matter. The exam suffers from some motion degradation and streak artifact from what are probably EEG monitoring devices. Electronically Signed   By: Paulina Fusi M.D.   On: 07/30/2022 17:28   DG CHEST PORT 1 VIEW  Result Date: 07/30/2022 CLINICAL DATA:  [id number] Acute encephalopathy [id number] EXAM: PORTABLE CHEST 1 VIEW COMPARISON:  07/15/2022 FINDINGS: Cardiomegaly, vascular congestion. Continued small left pleural effusion with left lower lobe atelectasis or infiltrate, similar to prior study. Scarring in the right upper lobe. No acute bony abnormality. Spinal stimulator wires remain in stable position in the midthoracic spine. IMPRESSION: Small left pleural effusion with left lower lobe atelectasis or infiltrate, stable. Cardiomegaly, vascular congestion. Electronically Signed   By: Charlett Nose M.D.   On: 07/30/2022 02:04   US Abdomen Limited RUQ (LIVER/GB)  Result Date: 07/29/2022 CLINICAL DATA:  Right upper quadrant abdominal pain. EXAM: ULTRASOUND ABDOMEN LIMITED RIGHT UPPER QUADRANT COMPARISON:  CT abdomen pelvis dated 07/26/2022. FINDINGS: Evaluation is limited due to overlying bowel gas. Gallbladder: Cholecystectomy. Common bile duct: Diameter: 14 mm. The common bile duct is dilated, likely post cholecystectomy. Liver: The liver is grossly unremarkable. Portal vein is patent on color Doppler imaging with normal direction of blood flow towards the liver. Other: None. IMPRESSION:  Cholecystectomy, otherwise unremarkable right upper quadrant ultrasound. Electronically Signed   By: Elgie Collard M.D.   On: 07/29/2022 23:31   CT ABDOMEN PELVIS W CONTRAST  Result Date: 07/29/2022 CLINICAL DATA:  Acute abdominal pain EXAM: CT ABDOMEN AND PELVIS WITH CONTRAST TECHNIQUE: Multidetector CT imaging of the abdomen and pelvis was performed using the standard protocol following bolus administration of intravenous contrast. RADIATION DOSE REDUCTION: This exam was performed according to the departmental dose-optimization program which includes automated exposure control, adjustment of the mA and/or kV according to patient size and/or use of iterative reconstruction technique. CONTRAST:  75mL OMNIPAQUE IOHEXOL 350 MG/ML SOLN COMPARISON:  CT chest abdomen and pelvis 07/15/2022 FINDINGS: Lower chest: There is a chronic appearing small left pleural effusion with pleural thickening. Stable peripheral rounded parenchymal opacities are present favored as rounded atelectasis. The heart is enlarged. Hepatobiliary: Patient is status post cholecystectomy. There is intra and extrahepatic biliary ductal dilatation. The common bile duct measures 2.4 cm and contains hyperdense material similar to the prior study. No focal liver lesions are seen. Pancreas: Unremarkable. No pancreatic ductal dilatation or surrounding inflammatory changes. Spleen: Normal in size without focal abnormality. Adrenals/Urinary Tract: Left kidney is absent. Bilateral adrenal glands are within normal limits. Mildly hyperdense lesion in the superior pole the right kidney measures 11 mm and appears unchanged. There subcentimeter cortical cysts similar to the prior study. There is no hydronephrosis or perinephric fat stranding. There is a calculus in the renal pelvis measuring 12 mm which appears unchanged. Right ureter and bladder are within normal limits. Stomach/Bowel: Stomach is within normal limits. Appendix appears normal. No evidence of  bowel wall thickening, distention, or inflammatory changes. There is a large amount of stool throughout the colon. There is sigmoid colon diverticulosis. Vascular/Lymphatic: Extensive vascular calcifications are seen throughout the abdomen and pelvis as well as aorta. Aorta and IVC are normal in size. No enlarged lymph nodes are identified. Reproductive: Prostate is unremarkable. Other: No abdominal wall hernia or abnormality. No abdominopelvic ascites. Musculoskeletal: Previously identified intramuscular hematoma in the anterior left lower abdominal wall has decreased  in size and now has the appearance of enhancing low-attenuation collections likely related to resolving hematomas. This area measures 7.6 by 2.5 by 2.0 cm. No acute hematomas are identified. Thoracic spinal cord stimulator device is present. Multilevel degenerative changes affect the spine. Chronic compression deformity of L2 is unchanged. IMPRESSION: 1. No acute localizing process in the abdomen or pelvis. 2. Stable intra and extrahepatic biliary ductal dilatation with hyperdense material in the common bile duct compatible with choledocholithiasis. 3. Stable nonobstructing right renal calculus. 4. Stable hyperdense lesion in the superior pole the right kidney, indeterminate. Recommend further evaluation with MRI or ultrasound. 5. Stable chronic small left pleural effusion with pleural thickening and rounded atelectasis. 6. Left lower abdominal wall intramuscular hematoma has decreased in size and now has the appearance of enhancing low-attenuation collections likely related to resolving hematomas. Aortic Atherosclerosis (ICD10-I70.0). Electronically Signed   By: Darliss Cheney M.D.   On: 07/29/2022 18:26   CT Head Wo Contrast  Result Date: 07/29/2022 CLINICAL DATA:  Mental status change, unknown cause. EXAM: CT HEAD WITHOUT CONTRAST TECHNIQUE: Contiguous axial images were obtained from the base of the skull through the vertex without intravenous  contrast. RADIATION DOSE REDUCTION: This exam was performed according to the departmental dose-optimization program which includes automated exposure control, adjustment of the mA and/or kV according to patient size and/or use of iterative reconstruction technique. COMPARISON:  CT head dated July 15, 2022 FINDINGS: Brain: No evidence of acute infarction, hemorrhage, hydrocephalus, extra-axial collection or mass lesion/mass effect. Prominence of the ventricles and sulci secondary to moderate cerebral atrophy. Diffuse low-attenuation of the periventricular white matter presumed advanced chronic microvascular ischemic changes. Vascular: No hyperdense vessel or unexpected calcification. Skull: Normal. Negative for fracture or focal lesion. Sinuses/Orbits: No acute finding. Other: None. IMPRESSION: 1. No acute intracranial abnormality. 2. Moderate cerebral atrophy and advanced chronic microvascular ischemic changes of the periventricular white matter. Electronically Signed   By: Larose Hires D.O.   On: 07/29/2022 18:22   VAS [place] UPPER EXTREMITY ARTERIAL DUPLEX  Result Date: 07/28/2022  UPPER EXTREMITY DUPLEX STUDY Patient Name:  MATHIUS YAHOLA  Date of Exam:   07/28/2022 Medical Rec #: [id number]              Accession #:    [id number] Date of Birth: 1950/05/16              Patient Gender: M Patient Age:   33 years Exam Location:  [facility] Procedure:      VAS [place] UPPER EXTREMITY ARTERIAL DUPLEX Referring Phys: Emilie Rutter --------------------------------------------------------------------------------  Indications: Pain.  Risk Factors:  Hypertension, Diabetes, coronary artery disease. Other Factors: 06/25/22- Procedure Performed:                1. Ultrasound-guided access right common femoral artery                2. Limited arch aortogram                3. Catheter selection of right axillary artery with dedicated                right upper extremity arteriogram and catheter selection of  right                radial artery                4. Right radial artery angioplasty (2.5 mm x 80 mm [place]) for  high grade 90% stenosis                5. Mynx closure of the right common femoral artery                07/17/22- AMPUTATION OF RIGHT RING FINGER AND PARTIAL AMPUTATION                OF RIGHT INDEX FINGER Performing Technologist: [place] Ponder RVS  Examination Guidelines: A complete evaluation includes B-mode imaging, spectral Doppler, color Doppler, and power Doppler as needed of all accessible portions of each vessel. Bilateral testing is considered an integral part of a complete examination. Limited examinations for reoccurring indications may be performed as noted.  Right Doppler Findings: +--------------+----------+----------+--------+--------+ Site          PSV (cm/s)Waveform  StenosisComments +--------------+----------+----------+--------+--------+ Subclavian Mid100       biphasic                   +--------------+----------+----------+--------+--------+ Axillary      101       biphasic                   +--------------+----------+----------+--------+--------+ Brachial Prox 128       triphasic                  +--------------+----------+----------+--------+--------+ Brachial Mid  124       triphasic                  +--------------+----------+----------+--------+--------+ Brachial Dist 110       triphasic                  +--------------+----------+----------+--------+--------+ Radial Prox   86        monophasic                 +--------------+----------+----------+--------+--------+ Radial Mid    34        monophasic                 +--------------+----------+----------+--------+--------+ Radial Dist   34        monophasic                 +--------------+----------+----------+--------+--------+ Ulnar Prox    73        biphasic                   +--------------+----------+----------+--------+--------+ Ulnar Mid     44         monophasic                 +--------------+----------+----------+--------+--------+ Ulnar Dist    27        monophasic                 +--------------+----------+----------+--------+--------+    Summary:  Right: No obstruction visualized in the right upper extremity. *See table(s) above for measurements and observations. Electronically signed by Heath Lark on 07/28/2022 at 11:51:59 AM.    Final    CT CHEST ABDOMEN PELVIS WO CONTRAST  Result Date: 07/15/2022 CLINICAL DATA:  Nonlocalized abdominal pain. History of GIST of the stomach, left nephrectomy, cholecystectomy. Recent removal of peritoneal dialysis catheter 06/30/2022. EXAM: CT CHEST, ABDOMEN AND PELVIS WITHOUT CONTRAST TECHNIQUE: Multidetector CT imaging of the chest, abdomen and pelvis was performed following the standard protocol without IV contrast. RADIATION DOSE REDUCTION: This exam was performed according to the departmental dose-optimization program which includes automated exposure control, adjustment of the mA and/or kV according to patient  size and/or use of iterative reconstruction technique. COMPARISON:  Portable chest today, portable chest 05/07/2022, chest, abdomen and pelvis CTs with contrast 04/11/2022 and 10/21/2021. FINDINGS: CT CHEST FINDINGS Cardiovascular: Stable cardiomegaly with patchy three-vessel calcific CAD. No substantial pericardial effusion. Chronically prominent pulmonary trunk measuring 2.9 cm is unchanged. There are heavy calcifications and mild tortuosity of the thoracic aorta, patchy calcific plaques in the great vessels. There is no aortic aneurysm. There are calcifications along the aortic valve leaflets as well. Pulmonary veins are normal caliber. Mediastinum/Nodes: There are multiple borderline prominent anterior and middle mediastinal lymph nodes, fever 2 slightly prominent, largest is a subcarinal lymph node measuring 1 cm in short axis. There is no new or enlarging adenopathy. The thyroid gland,  axillary spaces are unremarkable. The thoracic esophagus, thoracic trachea, and main bronchi are unremarkable. Lungs/Pleura: There is a small left posterior basal pleural effusion, which now appears loculated. Left lower lobe base adjacent atelectasis or consolidation which is unchanged in appearance. The lungs exhibit scattered linear scar-like opacities, likely postsurgical scarring in the right upper lobe, with a chronically elevated right diaphragm. There is a stable 8 mm noncalcified right lower lobe nodule on 5:85. Stable back to 01/23/2021. Left lower lobe volume loss again is suggested adjacent the pleural effusion. Central airways are patent. No new abnormality in the lung fields. Musculoskeletal: Extensive thoracic spine bridging enthesopathy no acute or other significant osseous findings. Spinal cord stimulator wiring again terminates at T6 and separately at T11. There is bridging enthesopathy along lower thoracic spinous processes. Mild gynecomastia. CT ABDOMEN PELVIS FINDINGS Hepatobiliary: Subcentimeter nonspecific hypodensity is again noted inferiorly in segment 6. No other focal liver abnormality is seen without contrast. There is increased intrahepatic and extrahepatic biliary dilatation post cholecystectomy, with common bile duct now 2 cm. The cystic duct remnant and the common bile duct are both packed with numerous subcentimeter rim calcified stones. I believe these were present previously at least on the last CT, but there was pneumobilia on that study confusing the picture. Pancreas: No focal abnormality or ductal dilatation is seen without contrast. Unchanged periampullary duodenal diverticulum. Spleen: There are calcified hilar arterial plaques. No splenomegaly. No other focal abnormality. Adrenals/Urinary Tract: Left nephrectomy. 1.1 cm hyperdense cyst upper pole right kidney. There are scattered collecting system stones, medullary nephrocalcinosis and a 1.2 cm right renal pelvis/UPJ stone  without hydronephrosis which I believe was also present on the last CT. Collecting system stones measure up to 2 cm. There are additional too small to characterize renal hypodensities in a few small cysts. There is no adrenal mass. No suspicious renal lesion without contrast. No right ureteral stone. There is mild bladder thickening and perivesical haziness consistent with cystitis. Stomach/Bowel: Somewhat elongated stomach is unchanged in appearance with mild thickened folds proximally. There is no small bowel obstruction or inflammation. There is advanced colonic diverticulosis. Chronic thickening in the wall of the sigmoid colon is unchanged, without overt inflammatory reaction. Increased rectal fecal retention is seen without findings of stercoral proctitis. Vascular/Lymphatic: There is heavy aortoiliac and visceral arterial calcific plaque no AAA. No adenopathy is seen. Reproductive: Prostate is normal in size. Other: There is hemoperitoneum with irregular Hematoma measuring 37-45 Hounsfield units collecting adjacent the left rectus sheath in the mid to lower abdomen measuring as much as 12 cm coronal, 3.4 cm AP and extending for a craniocaudal length up to 10 cm. There is a small volume of free hemorrhagic content in the distal paracolic gutters and pelvis. The hemorrhagic source is  most likely from the left rectus muscle/sheath as this is where the peritoneal dialysis catheter entered. There is mild body wall anasarca which was not seen previously. There is no free air or abscess. Mild mesenteric congestive features appear similar. Musculoskeletal: There is osteopenia with degenerative changes of the lumbar spine and chronic compression fracture of L2, moderate hip DJD. No destructive osseous or lytic lesions. In addition to bridging enthesopathy there are bridging syndesmophytes primarily of the thoracic spine. IMPRESSION: 1. Left mid to lower abdominal hemoperitoneum, most likely from the left rectus  sheath/sheath as this is where the peritoneal dialysis catheter entered and was recently removed. The largest amount is in a hematoma along the inner surface of the left abdominal wall. 2. Small volume of free hemorrhagic content in the distal paracolic gutters and pelvis. 3. Likely cystitis. 4. Small loculated left posterior basal pleural effusion with adjacent atelectasis or consolidation, unchanged. 5. Stable 8 mm right lower lobe nodule back to 01/23/2021. Attention on follow-up scans recommended. 6. Cardiomegaly with aortic and coronary artery atherosclerosis. Chronic prominence of the pulmonary trunk. 7. Stable borderline prominent mediastinal lymph nodes. 8. Increased intrahepatic and extrahepatic biliary dilatation post cholecystectomy, with cystic duct remnant and common bile duct both packed with numerous subcentimeter rim calcified stones. 9. Bilateral nephrolithiasis and medullary nephrocalcinosis with 1.2 cm right renal pelvis/UPJ stone without hydronephrosis. 10. Chronic thickening in the sigmoid colon without overt inflammatory reaction. Diffuse diverticulosis. 11. Increased rectal fecal retention. No findings of stercoral proctitis. 12. Osteopenia and degenerative/DISH change. Chronic L2 compression fracture. 13. Critical Value/emergent results were called by telephone at the time of interpretation on 07/15/2022 at 11:27 pm to provider [facility] , who verbally acknowledged these results. Electronically Signed   By: Almira Bar M.D.   On: 07/15/2022 23:37   CT Head Wo Contrast  Result Date: 07/15/2022 CLINICAL DATA:  Mental status change EXAM: CT HEAD WITHOUT CONTRAST TECHNIQUE: Contiguous axial images were obtained from the base of the skull through the vertex without intravenous contrast. RADIATION DOSE REDUCTION: This exam was performed according to the departmental dose-optimization program which includes automated exposure control, adjustment of the mA and/or kV according to patient size  and/or use of iterative reconstruction technique. COMPARISON:  CT 12/15/2021 FINDINGS: Brain: No evidence of acute infarction, hemorrhage, hydrocephalus, extra-axial collection or mass lesion/mass effect. There is moderate diffuse atrophy. There is moderate patchy periventricular and deep white matter hypodensity, likely chronic small vessel ischemic change. This is similar to prior. 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 Vascular: There are atherosclerotic calcifications of the intracranial internal carotid arteries, vertebral arteries and peripheral vasculature. Skull: Normal. Negative for fracture or focal lesion. Sinuses/Orbits: No acute finding. Other: None. IMPRESSION: 1. No acute intracranial process. 2. Moderate chronic small vessel ischemic change and atrophy. Electronically Signed   By: Darliss Cheney M.D.   On: 07/15/2022 22:52   DG Hand Complete Right  Result Date: 07/15/2022 CLINICAL DATA:  Pain.  Necrotic fourth digit. EXAM: RIGHT HAND - COMPLETE 3+ VIEW COMPARISON:  Hand radiograph 06/04/2022 FINDINGS: The soft tissues of the fourth digit distally are atrophic at the level of the distal phalanx and distal aspect of the proximal phalanx, new from prior exam. No soft tissue gas. There are extensive vascular calcifications. IV catheter is seen overlying the dorsum of the wrist. Multifocal osteoarthritis. No fracture. No erosive or bony destructive change or periostitis. IMPRESSION: 1. Atrophic soft tissues of the fourth digit distally at the level of the distal  phalanx and distal aspect of the proximal phalanx, new from prior exam. No soft tissue gas or findings of osteomyelitis. 2. Multifocal osteoarthritis. Electronically Signed   By: Narda Rutherford M.D.   On: 07/15/2022 19:39   DG Chest Port 1 View  Result Date: 07/15/2022 CLINICAL DATA:  Shortness of breath. EXAM: PORTABLE CHEST 1 VIEW COMPARISON:  Chest radiograph dated 05/07/2022. FINDINGS: Shallow inspiration. Small left  pleural effusion and left lung base atelectasis or infiltrate as seen on the prior radiograph and CT of 12/15/2021. The right lung is clear. Postsurgical changes of the right upper lobe. No pneumothorax. Stable cardiac silhouette. No acute osseous pathology. Thoracic spine stimulator. IMPRESSION: Small left pleural effusion and left lung base atelectasis or infiltrate. Electronically Signed   By: Elgie Collard M.D.   On: 07/15/2022 19:35     TODAY-DAY OF DISCHARGE:  Subjective:   Chase [name] today remains confused.  Objective:   Blood pressure 104/62, pulse 84, temperature (!) 97.2 F (36.2 C), temperature source Oral, resp. rate 15, height 5\' 6"  (1.676 m), weight 76.6 kg, SpO2 93%.  Intake/Output Summary (Last 24 hours) at 08/11/2022 1118 Last data filed at 08/10/2022 2026 Gross per 24 hour  Intake 163 ml  Output 500 ml  Net -337 ml   Filed Weights   08/08/22 0500 08/09/22 0500 08/11/22 0705  Weight: 83 kg 80.1 kg 76.6 kg    Exam:  No new F.N deficits, Normal affect [place].AT,PERRAL Supple Neck,No JVD, No cervical lymphadenopathy appriciated.  Symmetrical Chest wall movement, Good air movement bilaterally, CTAB RRR,No Gallops,Rubs or new Murmurs, No Parasternal Heave +ve B.Sounds, Abd Soft, Non tender, No organomegaly appriciated, No rebound -guarding or rigidity. No Cyanosis, Clubbing or edema, No new Rash or bruise   PERTINENT RADIOLOGIC STUDIES: CT Angio Abd/Pel w/ and/or w/o  Result Date: 08/10/2022 CLINICAL DATA:  Lower gastrointestinal hemorrhage EXAM: CTA ABDOMEN AND PELVIS WITHOUT AND WITH CONTRAST TECHNIQUE: Multidetector CT imaging of the abdomen and pelvis was performed using the standard protocol during bolus administration of intravenous contrast. Multiplanar reconstructed images and MIPs were obtained and reviewed to evaluate the vascular anatomy. RADIATION DOSE REDUCTION: This exam was performed according to the departmental dose-optimization program which includes  automated exposure control, adjustment of the mA and/or kV according to patient size and/or use of iterative reconstruction technique. CONTRAST:  75mL OMNIPAQUE IOHEXOL 350 MG/ML SOLN COMPARISON:  07/29/2022 FINDINGS: VASCULAR Aorta: Extensive mixed atherosclerotic calcification. No aortic aneurysm. No periaortic inflammatory change or fluid collection. Celiac: A non flow limiting thin septum is seen at the origin of the celiac axis, best seen on axial image # 72/10 and sagittal image # 95/14. Distally, the by a cell is widely patent without aneurysm or dissection. There is mural irregularity involving the proximal common hepatic artery and splenic artery likely related to atherosclerotic disease. Left gastric artery demonstrates variant anatomy with separate origin from the aorta. SMA: Soft plaque within the proximal superior mesenteric artery results in a 50-75% stenosis of the vessel. Distally, the vessel is widely patent without aneurysm or dissection. Renals: Status post left nephrectomy. Right renal artery is diffusely of narrow caliber but demonstrates a 50-75% stenosis at its origin. IMA: 50% stenosis at the origin.  Distally widely patent. Inflow: Patent without evidence of aneurysm, dissection, vasculitis or significant stenosis. Proximal Outflow: Bilateral common femoral and visualized portions of the superficial and profunda femoral arteries are patent without evidence of aneurysm, dissection, vasculitis or significant stenosis. Veins: Unremarkable Review of the MIP images confirms  the above findings. 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 Associated rounded atelectasis involving the left lower lobe adjacent to this collection again noted. Extensive multi-vessel coronary artery calcification. Mild global cardiomegaly. Hepatobiliary: Status post cholecystectomy. There is intra and extrahepatic  biliary ductal dilation again identified with heterogeneous intraluminal material within the dilated biliary tree representing either debris, calculi, or less commonly, mucin if there is a history of intraductal papillary neoplasm. This appears similar to prior examination. The liver is otherwise unremarkable. Pancreas: Unremarkable Spleen: Unremarkable Adrenals/Urinary Tract: The adrenal glands are unremarkable. Status post left nephrectomy. Multiple simple and hyperdense cortical cysts are seen within the right kidney for which no follow-up imaging is recommended. Rim calcified endophytic lesion within the interpolar region of the right kidney at axial image # 90/17 demonstrates marginal calcification but no significant internal enhancement likely represents an involuted cyst. Stable 12 mm nonobstructing calculus within the right renal pelvis. No hydronephrosis. The bladder is largely decompressed. Superimposed mild perivesicular inflammatory stranding is seen circumferentially which may reflect a superimposed infectious or inflammatory cystitis. Stomach/Bowel: Surgical changes of partial gastrectomy and partial small-bowel resection are identified. No evidence of obstruction. Severe descending and sigmoid colonic diverticulosis without superimposed acute inflammatory change. No active gastrointestinal hemorrhage. The stomach, small bowel, and large bowel are otherwise unremarkable. Appendix normal. No free intraperitoneal gas or fluid. Lymphatic: No pathologic adenopathy within the abdomen and pelvis. Reproductive: Prostate is unremarkable. Other: Stable 17 x 30 mm loculated fluid collection within the left mid abdomen immediately subjacent to the left rectus sheath, compatible with a chronic hematoma. No abdominal wall hernia. Musculoskeletal: No acute bone abnormality. Stable remote L2 compression deformity. Osseous structures are diffusely osteopenic. Dorsal column stimulator device again noted. IMPRESSION: 1.  No active gastrointestinal hemorrhage. 2. Extensive multi-vessel coronary artery calcification. 3. 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 4. 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 5. 50% stenosis of the proximal inferior mesenteric artery. 6. Mild perivesicular inflammatory stranding may reflect a superimposed infectious or inflammatory cystitis. Correlation with urinalysis may be helpful. 7. 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 Associated rounded atelectasis involving the left lower lobe adjacent to this collection again noted. 8. Stable intra and extrahepatic biliary ductal dilation with heterogeneous intraluminal material within the dilated biliary tree representing either debris, calculi, or less commonly, mucin if there is a history of intraductal papillary neoplasm. Correlation with surgical history and ERCP or MRCP examination may be helpful for further evaluation. 9. Stable 12 mm nonobstructing calculus within the right renal pelvis. No hydronephrosis. 10. Severe descending and sigmoid colonic diverticulosis without superimposed acute inflammatory change. 11. Stable 17 x 30 mm loculated fluid collection within the left mid abdomen immediately subjacent to the left rectus sheath, compatible with a chronic hematoma. Aortic Atherosclerosis (ICD10-I70.0). Electronically Signed   By: Helyn Numbers M.D.   On: 08/10/2022 20:24   EEG adult  Result Date: 08/10/2022 Windell Norfolk, MD     08/10/2022  6:03 PM Patient Name: Chase [name] MRN: [id number] Epilepsy Attending: Windell Norfolk Referring Physician/Provider: Jerral Ralph Date: 08/10/2022 Duration: 23 minutes Patient history: 72 year old man with continued confusion, EEG to evaluate for seizure Level of alertness: Awake, confused AEDs during EEG study: Technical aspects: This EEG study was done with scalp electrodes positioned according to the 10-20  International system of electrode placement. Electrical activity was reviewed with band pass filter of  1-70Hz , sensitivity of 7 uV/mm, display speed of 34mm/sec with a 60Hz  notched filter applied as appropriate. EEG data were recorded continuously and digitally stored.  Video monitoring was available and reviewed as appropriate. Description: The posterior dominant rhythm consists of 9-10 Hz activity of moderate voltage (25-35 uV) seen predominantly in posterior head regions, symmetric and reactive to eye opening and eye closing. Drowsiness was characterized by attenuation of the posterior background rhythm. Sleep was characterized by vertex waves, sleep spindles (12 to 14 Hz), maximal frontocentral region.  There is an excessive amount of 15 to 18 Hz, 2-3 uV beta activity with irregular morphology distributed symmetrically and diffusely. EEG showed continuous generalized polymorphic sharply contoured 3 to 6 Hz theta-delta slowing. Sleep was not seen.  Hyperventilation and photic stimulation were not performed. There were no seizure or epileptiform discharges seen during this recording. ABNORMALITY - Continuous slow, generalized IMPRESSION: This study is suggestive of mild to moderate diffuse encephalopathy, nonspecific etiology but likely related to sedation, toxic-metabolic etiology. There were no seizures or epileptiform discharges seen during this recording. Amadou Camara     PERTINENT LAB RESULTS: CBC: Recent Labs    08/10/22 1859 08/11/22 0013 08/11/22 0607  WBC 7.7  --  8.5  HGB 11.3* 10.5* 10.2*  HCT 36.4* 34.7* 33.2*  PLT 117*  --  130*   CMET CMP     Component Value Date/Time   NA 133 (L) 08/11/2022 0607   K 4.6 08/11/2022 0607   CL 95 (L) 08/11/2022 0607   CO2 23 08/11/2022 0607   GLUCOSE 86 08/11/2022 0607   BUN 44 (H) 08/11/2022 0607   CREATININE 5.47 (H) 08/11/2022 0607   CALCIUM 8.9 08/11/2022 0607   PROT 6.4 (L) 08/07/2022 0610   ALBUMIN 2.6 (L) 08/11/2022 0607   AST 10  (L) 08/07/2022 0610   ALT 7 08/07/2022 0610   ALKPHOS 112 08/07/2022 0610   BILITOT 0.6 08/07/2022 0610   GFRNONAA 11 (L) 08/11/2022 0607    GFR Estimated Creatinine Clearance: 12.2 mL/min (A) (by C-G formula based on SCr of 5.47 mg/dL (H)). No results for input(s): "LIPASE", "AMYLASE" in the last 72 hours. No results for input(s): "CKTOTAL", "CKMB", "CKMBINDEX", "TROPONINI" in the last 72 hours. Invalid input(s): "POCBNP" No results for input(s): "DDIMER" in the last 72 hours. No results for input(s): "HGBA1C" in the last 72 hours. No results for input(s): "CHOL", "HDL", "LDLCALC", "TRIG", "CHOLHDL", "LDLDIRECT" in the last 72 hours. No results for input(s): "TSH", "T4TOTAL", "T3FREE", "THYROIDAB" in the last 72 hours.  Invalid input(s): "FREET3" No results for input(s): "VITAMINB12", "FOLATE", "FERRITIN", "TIBC", "IRON", "RETICCTPCT" in the last 72 hours. Coags: No results for input(s): "INR" in the last 72 hours.  Invalid input(s): "PT" Microbiology: No results found for this or any previous visit (from the past 240 hour(s)).  FURTHER DISCHARGE INSTRUCTIONS:  Get Medicines reviewed and adjusted: Please take all your medications with you for your next visit with your Primary MD  Laboratory/radiological data: Please request your Primary MD to go over all hospital tests and procedure/radiological results at the follow up, please ask your Primary MD to get all Hospital records sent to his/her office.  In some cases, they will be blood work, cultures and biopsy results pending at the time of your discharge. Please request that your primary care M.D. goes through all the records of your hospital data and follows up on these results.  Also Note the following: If you experience worsening of your admission symptoms, develop shortness of  breath, life threatening emergency, suicidal or homicidal thoughts you must seek medical attention immediately by calling 911 or calling your MD  immediately  if symptoms less severe.  You must read complete instructions/literature along with all the possible adverse reactions/side effects for all the Medicines you take and that have been prescribed to you. Take any new Medicines after you have completely understood and accpet all the possible adverse reactions/side effects.   Do not drive when taking Pain medications or sleeping medications (Benzodaizepines)  Do not take more than prescribed Pain, Sleep and Anxiety Medications. It is not advisable to combine anxiety,sleep and pain medications without talking with your primary care practitioner  Special Instructions: If you have smoked or chewed Tobacco  in the last 2 yrs please stop smoking, stop any regular Alcohol  and or any Recreational drug use.  Wear Seat belts while driving.  Please note: You were cared for by a hospitalist during your hospital stay. Once you are discharged, your primary care physician will handle any further medical issues. Please note that NO REFILLS for any discharge medications will be authorized once you are discharged, as it is imperative that you return to your primary care physician (or establish a relationship with a primary care physician if you do not have one) for your post hospital discharge needs so that they can reassess your need for medications and monitor your lab values.  Total Time spent coordinating discharge including counseling, education and face to face time equals greater than 30 minutes.  Signed:   08/11/2022 11:18 AM

## 2022-08-11 NOTE — Progress Notes (Signed)
Noted patient has been transitioned to comfort care. Nephrology service will now sign off. Please reach out to [place] for questions if needed.  Salome Holmes, NP [facility]

## 2022-08-11 NOTE — Progress Notes (Signed)
Chart reviewed and noted plans for comfort care. Contacted [facility] to advise clinic that pt has transitioned to comfort care and will d/c to hospice home today.   Olivia Canter Renal Navigator [phone]

## 2022-08-11 NOTE — Progress Notes (Signed)
PROGRESS NOTE        PATIENT DETAILS Name: Chase [name] Age: 72 y.o. Sex: male Date of Birth: [birth year] Admit Date: 07/29/2022 Admitting Physician Joseph Art, DO NUU:[id number] Alinda Dooms, MD  Brief Summary: Patient is a 72 y.o.  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  Evaluated by stroke MD-started on DAPT-unfortunately hospital course complicated by delirium/confusion-interfering with hemodialysis, and lower GI bleeding with acute blood loss anemia.  After extensive discussion with spouse-patient was transitioned to full comfort measures on 8/5-see below for further details.  Significant events: 7/23>> admit to [facility] 7/24>> seizure-like activity-neuro consult. 8/04>> multiple episodes of hematochezia-slight drop in hemoglobin-ASA/Plavix discontinued.  Discussion with wife-poor prognosis-mental status preventing safe hemodialysis.  Ongoing goals of care-DNR placed. 8/05>> multiple episodes of hematochezia overnight-Long discussion with spouse-transitioning to comfort measures.  Significant studies: 7/23>> CT head: No acute intracranial abnormality 7/23>> CT abdomen/pelvis: Stable intra/extrahepatic biliary ductal dilatation-with hyperdense material in the CBD-compatible with choledocholithiasis.  Left lower abdominal wall intramuscular hematoma has decreased in size. 7/23>> RUQ ultrasound: Cholecystectomy-otherwise unremarkable RUQ ultrasound. 7/24>> CT head: No acute finding. 7/24-7/25>> LTM EEG: No definite seizures were seen. 7/26>> CT head: Focal hypodense lesion in the left hemipons-possible age-indeterminate CVA 7/26>> echo: EF 60-65% 7/27>> CT head: Redemonstrated hypoattenuation in the left pons consistent with age-indeterminate  infarct.  Significant microbiology data: 7/24>> blood culture: No growth 7/24>> COVID PCR: Negative  Procedures: None   Consults: Neurology Nephrology Phone consult-gastroenterology Palliative care  Subjective: Multiple episodes of hematochezia overnight-remains confused.  Objective: Vitals: Blood pressure 104/62, pulse 84, temperature (!) 97.2 F (36.2 C), temperature source Oral, resp. rate 15, height 5\' 6"  (1.676 m), weight 76.6 kg, SpO2 93%.   Exam: Gen Exam: Confused-not in any distress HEENT:atraumatic, normocephalic Chest: B/L clear to auscultation anteriorly CVS:S1S2 regular Abdomen:soft non tender, non distended Extremities:no edema Neurology: Non focal Skin: no rash  Pertinent Labs/Radiology:    Latest Ref Rng & Units 08/11/2022    6:07 AM 08/11/2022   12:13 AM 08/10/2022    6:59 PM  CBC  WBC 4.0 - 10.5 K/uL 8.5   7.7   Hemoglobin 13.0 - 17.0 g/dL 64.4  03.4  74.2   Hematocrit 39.0 - 52.0 % 33.2  34.7  36.4   Platelets 150 - 400 K/uL 130   117     Lab Results  Component Value Date   NA 133 (L) 08/11/2022   K 4.6 08/11/2022   CL 95 (L) 08/11/2022   CO2 23 08/11/2022      Assessment/Plan: Seizure disorder Found to have seizure-like activity on 7/24 Was started on Depakote Seizures likely triggered by acute CVA Due to persistent encephalopathy-preventing safe HD-and now worsening lower GI bleed-after extensive discussion with family-transition to full comfort measures.  Will switch to IV Depakote for comfort for now.  Acute metabolic encephalopathy Felt to be multifactorial (polypharmacy/seizures/CVA/delirium all contributing)-neuroimaging as above-unfortunately unable to obtain MRI-due to spinal stimulator Although somewhat improved compared to how he first presented-his improvement has plateaued-and unfortunately continues to be confused.  Per nephrology-unable to perform long-term HD in this scenario.  Repeat EEG on 8/4 negative for seizures.   Discussed with neurologist Dr. Cherylann Ratel  further intervention/treatment regimen can be offered. Recent ammonia level/TSH/B12/RPR all negative After extensive discussion with spouse over the past several days-since long-term dialysis is not going to be an option due to mental status-and now with lower GI bleeding-she has elected to transition to full comfort measures.  Acute CVA Workup as above Was on aspirin/Plavix-but was discontinued due to lower GI bleeding-with no plans to resume since patient has been transitioned to full comfort measures.  Lower GI bleeding with acute blood loss anemia Slight drop in hemoglobin-but does not require PRBC transfusion DAPT discontinued 8/4 CT angiogram negative for active bleeding Given mental status-very difficult to proceed with colonoscopy etc. GI MD was consulted but have canceled consultation as patient now comfort care. After extensive discussion with spouse Chase [name] over the phone on 8/4 and then again on 8/5-will plan to transition to comfort measures.  Choledocholithiasis Patient is s/p cholecystectomy-and has had numerous choledocholithiasis requiring numerous ERCP in the past (x 5 total-last 2 years back) His CT abdomen on admission shows choledocholithiasis-thankfully patient is asymptomatic-LFTs are stable.  Appreciate brief GI note on 8/1-no reason to pursue inpatient ERCP-recommendations are to follow-up with primary GI MD at [facility].    Hypoglycemia Continues to have borderline hypoglycemia-but otherwise stable No plans to check CBGs as patient has been transitioned to full comfort measures.  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 Previously was on PD-PD catheter was removed on 6/24  Recent history of abdominal wall hematoma at the site of PD catheter removal CT abdomen on admission shows continued evolution of hematoma-which has now decreased in size.  Hypothyroidism No longer on Synthroid-comfort measures  PAD No longer on antiplatelets-comfort measures  Debility/deconditioning Failure to thrive syndrome PT/OT eval-SNF recommended-now that we plan to initiate comfort measures-residential hospice planned. Patient pulled out NG tube 7/30  Palliative care Given overall issues-see above-poor prognosis-no improvement in mental status in spite of being in the hospital for close to 2 weeks.  Mental status has interfered with hemodialysis while hospitalized.  Long discussion with neurologist-unfortunately-no further intervention/medication changes recommended-he will likely have some amount of delirium/confusion going forward.  After extensive discussion with spouse-she understands poor prognosis-and feels that the patient is suffering and does not want any further aggressive care.  We have transition to full comfort measures starting 8/5.  Residential hospice planned.  Palliative consulted-social work consulted for placement.  Nutrition Status: Nutrition Problem: Inadequate oral intake Etiology: inability to eat Signs/Symptoms: other (comment) (mental status) Interventions: Refer to RD note for recommendations, Education  BMI: Estimated body mass index is 27.26 kg/m as calculated from the following:   Height as of this encounter: 5\' 6"  (1.676 m).   Weight as of this encounter: 76.6 kg.   Code status:   Code Status: DNR   DVT Prophylaxis:    Family Communication: Spouse-Chase [name]-[phone] updated 8/5   Disposition Plan: Status is: Inpatient Remains inpatient appropriate because: Severity of illness   Planned Discharge Destination: Residential hospice.   Diet: Diet Order             Diet  regular Room service appropriate? Yes; Fluid consistency: Thin  Diet effective now  Antimicrobial agents: Anti-infectives (From admission, onward)    None        MEDICATIONS: Scheduled Meds:   HYDROmorphone (DILAUDID) injection  0.5 mg Intravenous Q8H   sodium chloride flush  3 mL Intravenous Q12H   Continuous Infusions:  sodium chloride     valproate sodium     PRN Meds:.sodium chloride, acetaminophen **OR** acetaminophen, antiseptic oral rinse, diphenhydrAMINE, glycopyrrolate **OR** glycopyrrolate **OR** glycopyrrolate, haloperidol **OR** haloperidol **OR** haloperidol lactate, HYDROmorphone (DILAUDID) injection, LORazepam **OR** LORazepam **OR** LORazepam, magic mouthwash, nystatin, ondansetron **OR** ondansetron (ZOFRAN) IV, polyvinyl alcohol, sodium chloride flush   I have personally reviewed following labs and imaging studies  LABORATORY DATA: CBC: Recent Labs  Lab 08/07/22 0610 08/09/22 0341 08/10/22 1452 08/10/22 1859 08/11/22 0013 08/11/22 0607  WBC 7.1 7.0 7.5 7.7  --  8.5  NEUTROABS 4.8  --   --   --   --   --   HGB 10.5* 11.1* 11.3* 11.3* 10.5* 10.2*  HCT 34.5* 36.7* 36.3* 36.4* 34.7* 33.2*  MCV 94.5 94.1 95.3 94.1  --  94.9  PLT 157 155 123* 117*  --  130*    Basic Metabolic Panel: Recent Labs  Lab 08/05/22 0034 08/06/22 0154 08/07/22 0610 08/09/22 0341 08/11/22 0013 08/11/22 0607  NA 132* 132* 130* 134* 134* 133*  K 5.9* 5.0 5.0 4.4 4.7 4.6  CL 93* 92* 91* 94* 97* 95*  CO2 28 29 27 27 24 23   GLUCOSE 133* 107* 81 91 99 86  BUN 55* 32* 45* 38* 40* 44*  CREATININE 4.69* 3.17* 4.45* 4.50* 5.05* 5.47*  CALCIUM 9.1 8.9 9.2 9.4 9.0 8.9  MG 1.9 1.7  --   --   --   --   PHOS 2.5 2.3*  --  3.7 4.3 4.4    GFR: Estimated Creatinine Clearance: 12.2 mL/min (A) (by C-G formula based on SCr of 5.47 mg/dL (H)).  Liver Function Tests: Recent Labs  Lab 08/06/22 0154 08/07/22 0610 08/09/22 0341 08/11/22 0013 08/11/22 0607   AST  --  10*  --   --   --   ALT  --  7  --   --   --   ALKPHOS  --  112  --   --   --   BILITOT  --  0.6  --   --   --   PROT  --  6.4*  --   --   --   ALBUMIN 2.4* 2.6* 2.7* 2.5* 2.6*   No results for input(s): "LIPASE", "AMYLASE" in the last 168 hours. Recent Labs  Lab 08/09/22 0341  AMMONIA 16    Coagulation Profile: No results for input(s): "INR", "PROTIME" in the last 168 hours.  Cardiac Enzymes: No results for input(s): "CKTOTAL", "CKMB", "CKMBINDEX", "TROPONINI" in the last 168 hours.  BNP (last 3 results) No results for input(s): "PROBNP" in the last 8760 hours.  Lipid Profile: No results for input(s): "CHOL", "HDL", "LDLCALC", "TRIG", "CHOLHDL", "LDLDIRECT" in the last 72 hours.  Thyroid Function Tests: No results for input(s): "TSH", "T4TOTAL", "FREET4", "T3FREE", "THYROIDAB" in the last 72 hours.  Anemia Panel: No results for input(s): "VITAMINB12", "FOLATE", "FERRITIN", "TIBC", "IRON", "RETICCTPCT" in the last 72 hours.  Urine analysis:    Component Value Date/Time   COLORURINE STRAW (A) 06/27/2022 1351   APPEARANCEUR CLEAR 06/27/2022 1351   LABSPEC 1.006 06/27/2022 1351   PHURINE 9.0 (H) 06/27/2022 1351   GLUCOSEU 150 (A) 06/27/2022 1351   HGBUR LARGE (A) 06/27/2022 1351  BILIRUBINUR NEGATIVE 06/27/2022 1351   KETONESUR NEGATIVE 06/27/2022 1351   PROTEINUR 30 (A) 06/27/2022 1351   NITRITE NEGATIVE 06/27/2022 1351   LEUKOCYTESUR MODERATE (A) 06/27/2022 1351    Sepsis Labs: Lactic Acid, Venous    Component Value Date/Time   LATICACIDVEN 0.6 07/30/2022 0200    MICROBIOLOGY: No results found for this or any previous visit (from the past 240 hour(s)).   RADIOLOGY STUDIES/RESULTS: CT Angio Abd/Pel w/ and/or w/o  Result Date: 08/10/2022 CLINICAL DATA:  Lower gastrointestinal hemorrhage EXAM: CTA ABDOMEN AND PELVIS WITHOUT AND WITH CONTRAST TECHNIQUE: Multidetector CT imaging of the abdomen and pelvis was performed using the standard protocol  during bolus administration of intravenous contrast. Multiplanar reconstructed images and MIPs were obtained and reviewed to evaluate the vascular anatomy. RADIATION DOSE REDUCTION: This exam was performed according to the departmental dose-optimization program which includes automated exposure control, adjustment of the mA and/or kV according to patient size and/or use of iterative reconstruction technique. CONTRAST:  75mL OMNIPAQUE IOHEXOL 350 MG/ML SOLN COMPARISON:  07/29/2022 FINDINGS: VASCULAR Aorta: Extensive mixed atherosclerotic calcification. No aortic aneurysm. No periaortic inflammatory change or fluid collection. Celiac: A non flow limiting thin septum is seen at the origin of the celiac axis, best seen on axial image # 72/10 and sagittal image # 95/14. Distally, the by a cell is widely patent without aneurysm or dissection. There is mural irregularity involving the proximal common hepatic artery and splenic artery likely related to atherosclerotic disease. Left gastric artery demonstrates variant anatomy with separate origin from the aorta. SMA: Soft plaque within the proximal superior mesenteric artery results in a 50-75% stenosis of the vessel. Distally, the vessel is widely patent without aneurysm or dissection. Renals: Status post left nephrectomy. Right renal artery is diffusely of narrow caliber but demonstrates a 50-75% stenosis at its origin. IMA: 50% stenosis at the origin.  Distally widely patent. Inflow: Patent without evidence of aneurysm, dissection, vasculitis or significant stenosis. Proximal Outflow: Bilateral common femoral and visualized portions of the superficial and profunda femoral arteries are patent without evidence of aneurysm, dissection, vasculitis or significant stenosis. Veins: Unremarkable Review of the MIP images confirms the above findings. 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 Associated rounded atelectasis involving the left lower lobe adjacent to this collection again noted. Extensive multi-vessel coronary artery calcification. Mild global cardiomegaly. Hepatobiliary: Status post cholecystectomy. There is intra and extrahepatic biliary ductal dilation again identified with heterogeneous intraluminal material within the dilated biliary tree representing either debris, calculi, or less commonly, mucin if there is a history of intraductal papillary neoplasm. This appears similar to prior examination. The liver is otherwise unremarkable. Pancreas: Unremarkable Spleen: Unremarkable Adrenals/Urinary Tract: The adrenal glands are unremarkable. Status post left nephrectomy. Multiple simple and hyperdense cortical cysts are seen within the right kidney for which no follow-up imaging is recommended. Rim calcified endophytic lesion within the interpolar region of the right kidney at axial image # 90/17 demonstrates marginal calcification but no significant internal enhancement likely represents an involuted cyst. Stable 12 mm nonobstructing calculus within the right renal pelvis. No hydronephrosis. The bladder is largely decompressed. Superimposed mild perivesicular inflammatory stranding is seen circumferentially which may reflect a superimposed infectious or inflammatory cystitis. Stomach/Bowel: Surgical changes of partial gastrectomy and partial small-bowel resection are identified. No evidence of obstruction. Severe descending and sigmoid colonic diverticulosis without superimposed acute inflammatory change. No active gastrointestinal hemorrhage. The stomach, small bowel, and large bowel are otherwise  unremarkable. Appendix normal. No free intraperitoneal gas or fluid. Lymphatic: No pathologic adenopathy within the abdomen and pelvis. Reproductive: Prostate is unremarkable. Other: Stable 17 x 30 mm loculated fluid collection within the left mid abdomen immediately subjacent to the  left rectus sheath, compatible with a chronic hematoma. No abdominal wall hernia. Musculoskeletal: No acute bone abnormality. Stable remote L2 compression deformity. Osseous structures are diffusely osteopenic. Dorsal column stimulator device again noted. IMPRESSION: 1. No active gastrointestinal hemorrhage. 2. Extensive multi-vessel coronary artery calcification. 3. 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 4. 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 5. 50% stenosis of the proximal inferior mesenteric artery. 6. Mild perivesicular inflammatory stranding may reflect a superimposed infectious or inflammatory cystitis. Correlation with urinalysis may be helpful. 7. 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 Associated rounded atelectasis involving the left lower lobe adjacent to this collection again noted. 8. Stable intra and extrahepatic biliary ductal dilation with heterogeneous intraluminal material within the dilated biliary tree representing either debris, calculi, or less commonly, mucin if there is a history of intraductal papillary neoplasm. Correlation with surgical history and ERCP or MRCP examination may be helpful for further evaluation. 9. Stable 12 mm nonobstructing calculus within the right renal pelvis. No hydronephrosis. 10. Severe descending and sigmoid colonic diverticulosis without superimposed acute inflammatory change. 11. Stable 17 x 30 mm loculated fluid collection within the left mid abdomen immediately subjacent to the left rectus sheath, compatible with a chronic hematoma. Aortic Atherosclerosis (ICD10-I70.0). Electronically Signed   By: Helyn Numbers M.D.   On: 08/10/2022 20:24   EEG adult  Result Date: 08/10/2022 Windell Norfolk, MD     08/10/2022  6:03 PM Patient Name: Adolphus Denslow MRN: [id number] Epilepsy Attending: Windell Norfolk Referring Physician/Provider: Jerral Ralph Date: 08/10/2022  Duration: 23 minutes Patient history: 72 year old man with continued confusion, EEG to evaluate for seizure Level of alertness: Awake, confused AEDs during EEG study: Technical aspects: This EEG study was done with scalp electrodes positioned according to the 10-20 International system of electrode placement. Electrical activity was reviewed with band pass filter of 1-70Hz , sensitivity of 7 uV/mm, display speed of 39mm/sec with a 60Hz  notched filter applied as appropriate. EEG data were recorded continuously and digitally stored.  Video monitoring was available and reviewed as appropriate. Description: The posterior dominant rhythm consists of 9-10 Hz activity of moderate voltage (25-35 uV) seen predominantly in posterior head regions, symmetric and reactive to eye opening and eye closing. Drowsiness was characterized by attenuation of the posterior background rhythm. Sleep was characterized by vertex waves, sleep spindles (12 to 14 Hz), maximal frontocentral region.  There is an excessive amount of 15 to 18 Hz, 2-3 uV beta activity with irregular morphology distributed symmetrically and diffusely. EEG showed continuous generalized polymorphic sharply contoured 3 to 6 Hz theta-delta slowing. Sleep was not seen.  Hyperventilation and photic stimulation were not performed. There were no seizure or epileptiform discharges seen during this recording. ABNORMALITY - Continuous slow, generalized IMPRESSION: This study is suggestive of mild to moderate diffuse encephalopathy, nonspecific etiology but likely related to sedation, toxic-metabolic etiology. There were no seizures or epileptiform discharges seen during this recording. Amadou Camara     LOS: 12 days   Jeoffrey Massed, MD  [facility]    To contact the attending provider between 7A-7P or the covering provider during after hours 7P-7A, please log into the web site [email or web address] and access using universal [place] password  for that web site. If  you do not have the password, please call the hospital operator.  08/11/2022, 10:22 AM

## 2022-08-11 NOTE — Progress Notes (Signed)
   This referral was received from [place] SW with [facility] for hospice care at the [facility] in [facility] facility. I have reviewed chart, spoken to the family and my MD has approved pt for hospice care. We do have a bed to offer and the family has accepted this offer. We can take the pt today at 330pm. TOC Nadia notified. MD aware and has already completed documentation.   Norm Parcel RN [phone]

## 2022-08-26 ENCOUNTER — Telehealth (HOSPITAL_COMMUNITY): Payer: Self-pay

## 2022-08-26 NOTE — Telephone Encounter (Signed)
Called attempting to communicate potential schedule change, male answered the phone and informed that the patient passed away recently.

## 2022-09-07 DEATH — deceased

## 2022-10-27 ENCOUNTER — Ambulatory Visit: Payer: No Typology Code available for payment source

## 2022-10-27 ENCOUNTER — Other Ambulatory Visit (HOSPITAL_COMMUNITY): Payer: No Typology Code available for payment source

## 2022-10-28 ENCOUNTER — Ambulatory Visit: Payer: No Typology Code available for payment source

## 2022-10-28 ENCOUNTER — Other Ambulatory Visit (HOSPITAL_COMMUNITY): Payer: No Typology Code available for payment source

## 2023-05-17 IMAGING — DX DG KNEE COMPLETE 4+V*L*
4 series · 4 of 4 positions shown · non-contrast
Comparison: None.

CLINICAL DATA: Bilateral knee pain after fall today.

EXAM:
LEFT KNEE - COMPLETE 4+ VIEW

[knee ap]
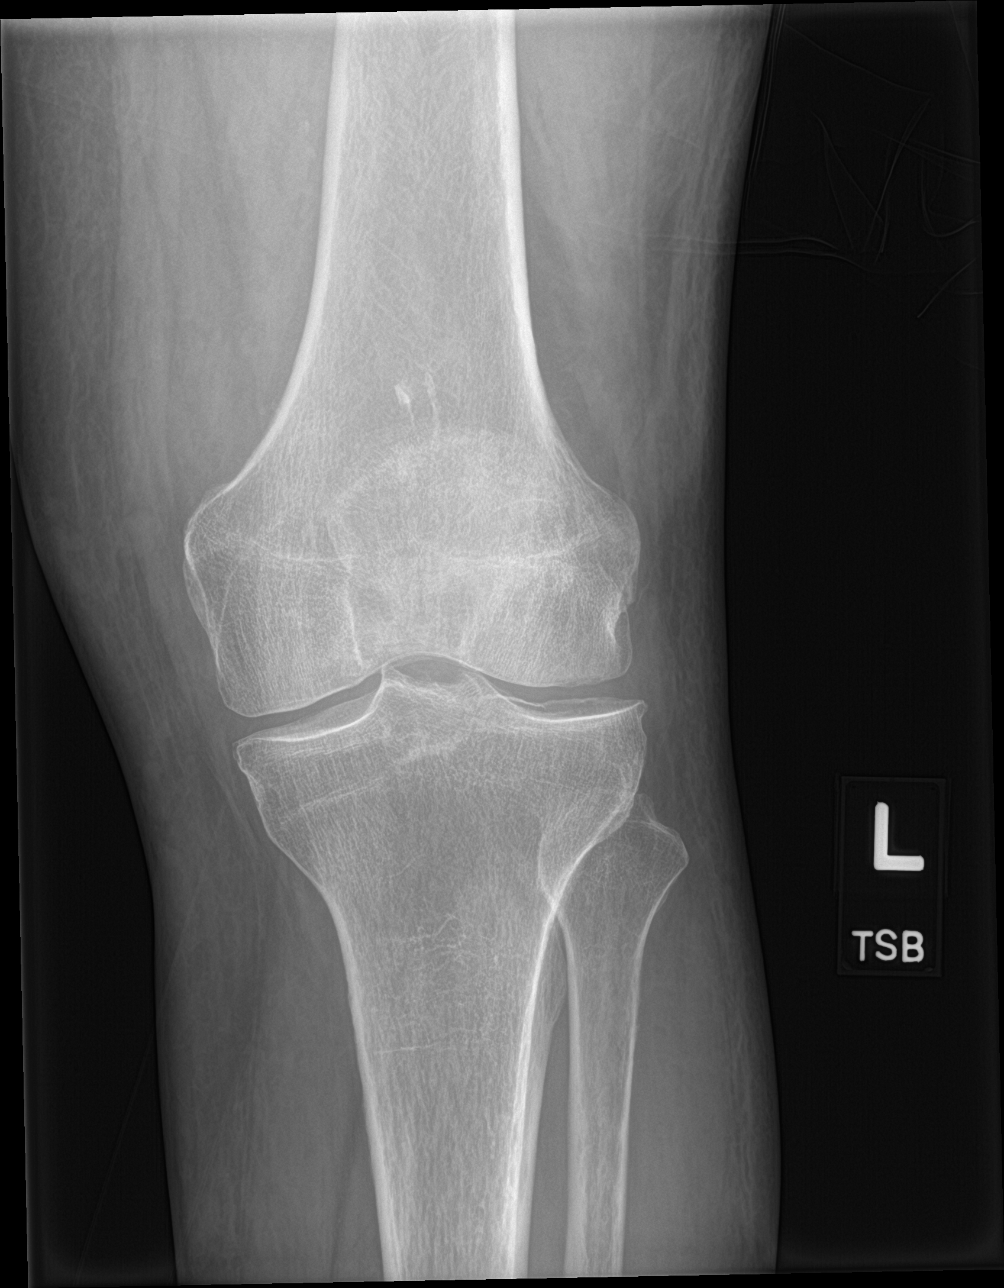

[knee lat]
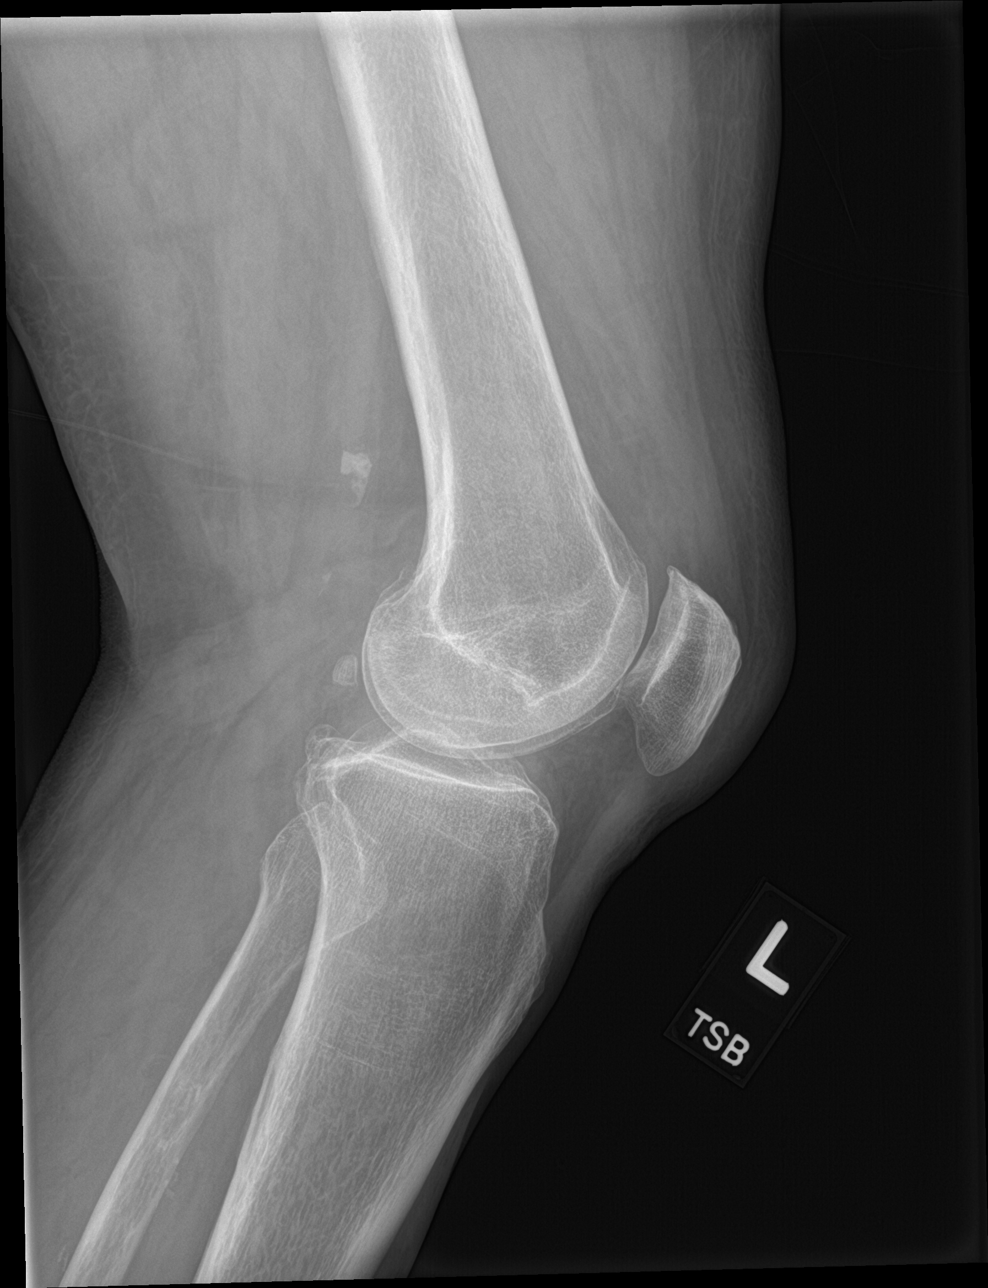

[knee obl (1 of 2)]
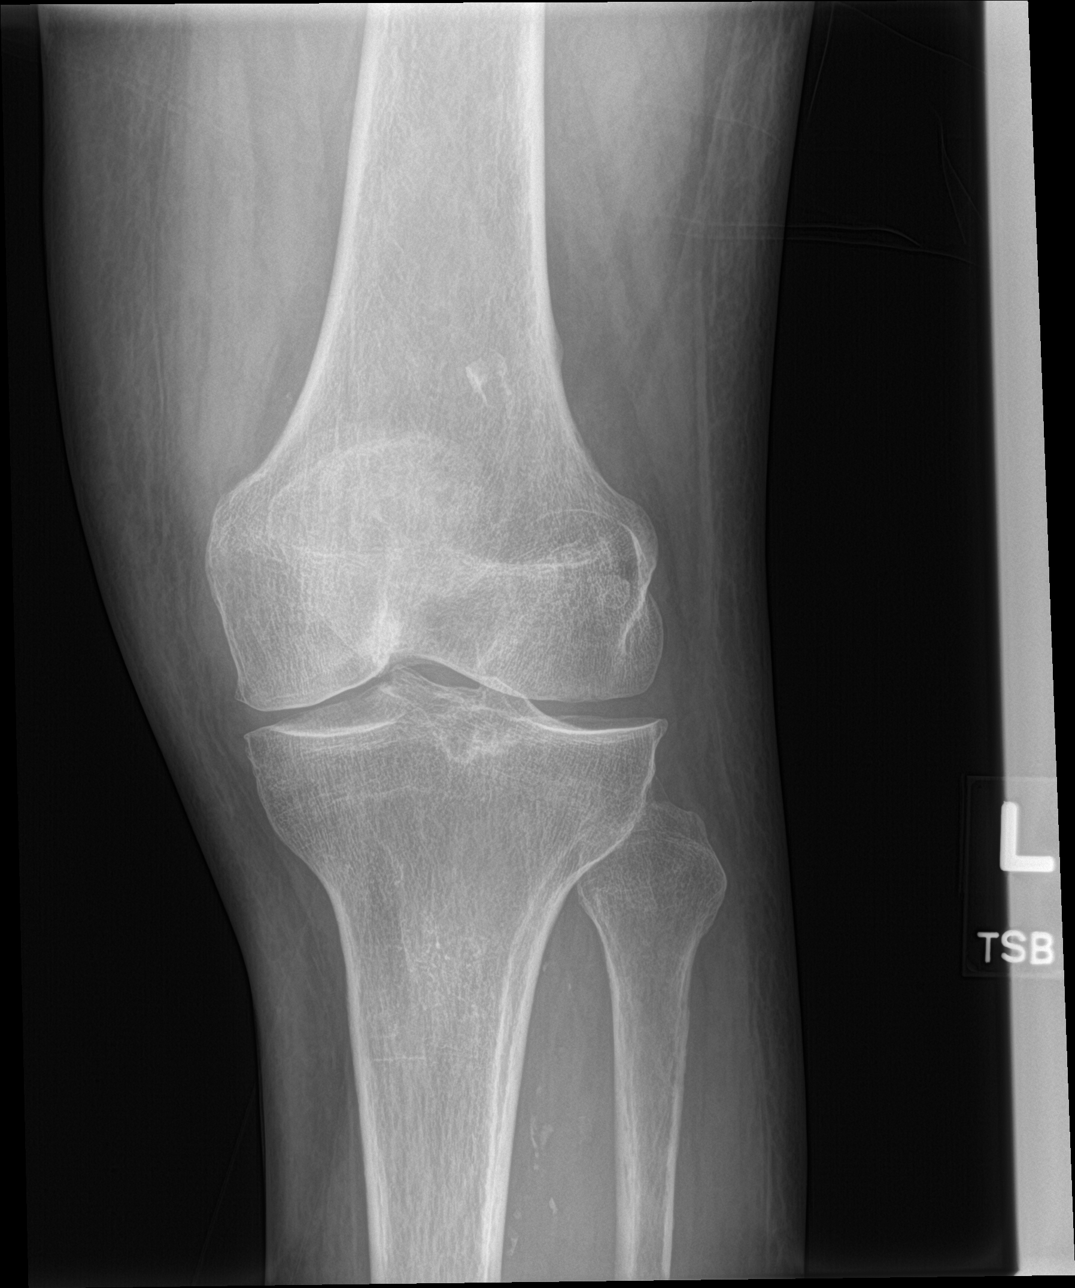

[knee obl (2 of 2)]
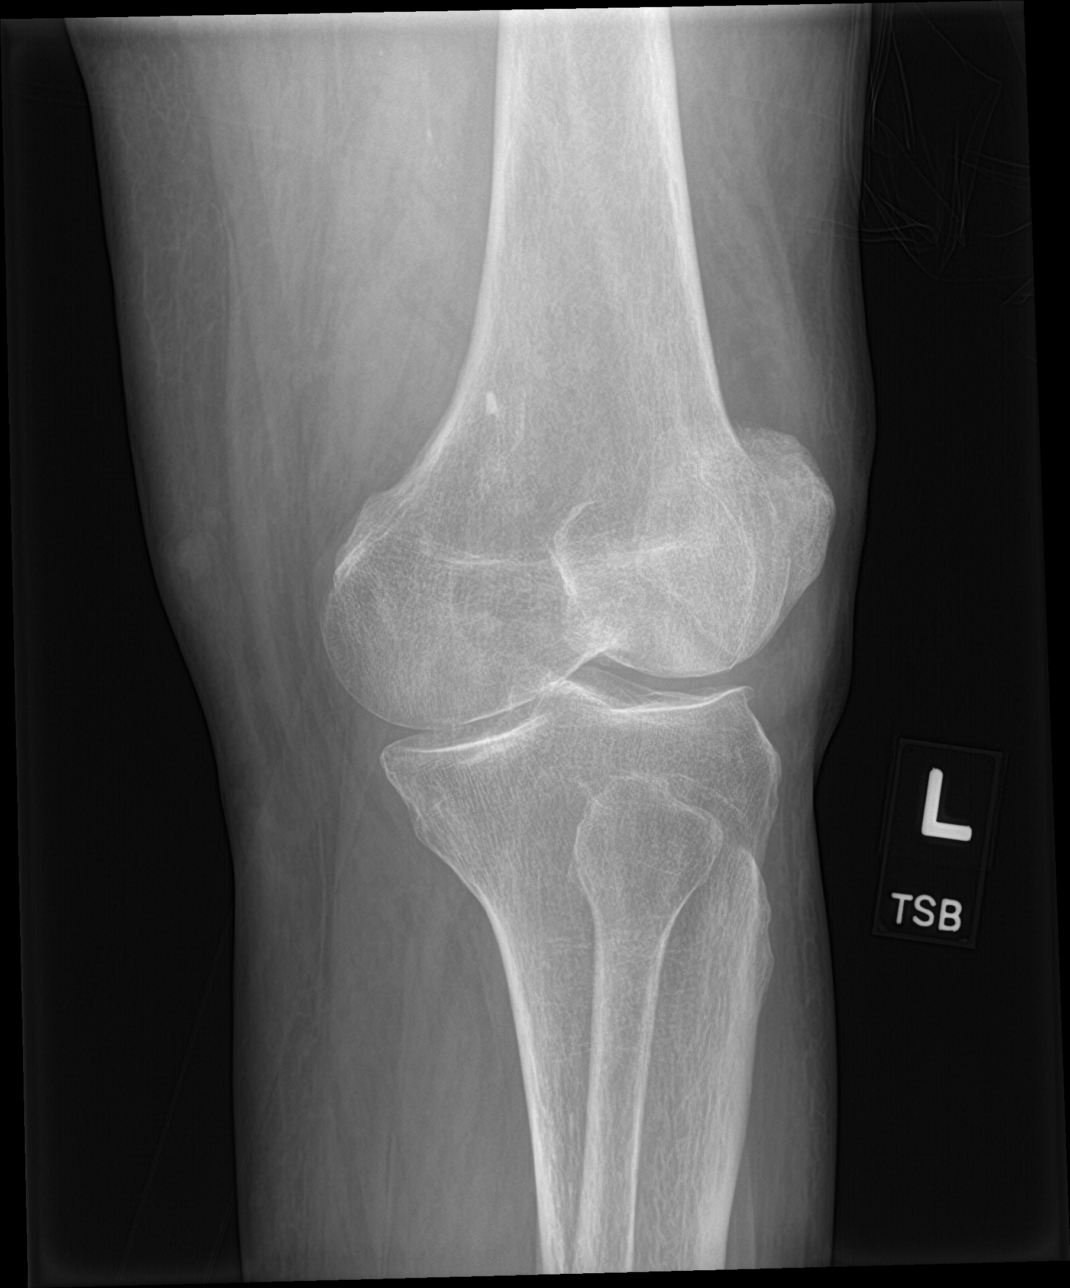

[4 of 4 positions shown; findings below may reference images not displayed]

FINDINGS: No evidence of fracture, dislocation, or joint effusion. Mild
tricompartmental degenerative change with peripheral spurring. Mild
soft tissue edema. Vascular calcifications are seen.
IMPRESSION: 1. No acute fracture or subluxation of the left knee.
2. Mild tricompartmental osteoarthritis.

## 2023-05-17 IMAGING — CT CT MAXILLOFACIAL W/O CM
3 series · 15 of 47 positions shown, 18 images · non-contrast
Comparison: None.

CLINICAL DATA: Pain, status post fall

EXAM:
CT HEAD WITHOUT CONTRAST
CT MAXILLOFACIAL WITHOUT CONTRAST
CT CERVICAL SPINE WITHOUT CONTRAST
TECHNIQUE: Multidetector CT imaging of the head, cervical spine, and
maxillofacial structures were performed using the standard protocol
without intravenous contrast. Multiplanar CT image reconstructions
of the cervical spine and maxillofacial structures were also
generated.

[Series 3: max soft · axial · 0.44mm/px · z∈[+1078,+1228]mm · 9 of 89 slices shown, 12 images]
[im 7/89  brain]
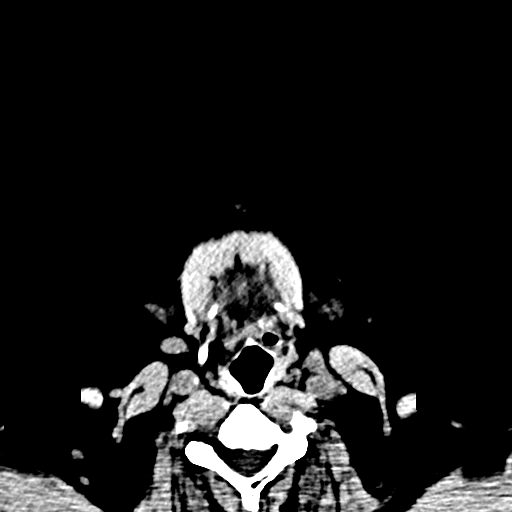
[im 7/89  bone]
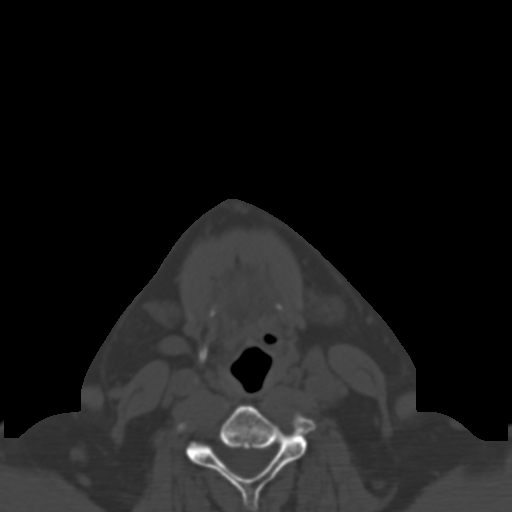
[im 16/89  bone]
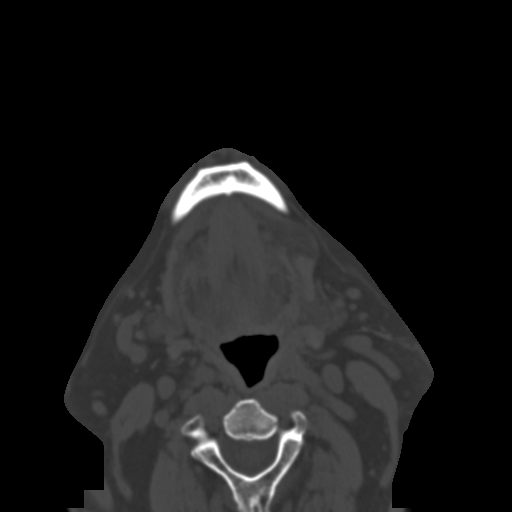
[im 25/89  bone]
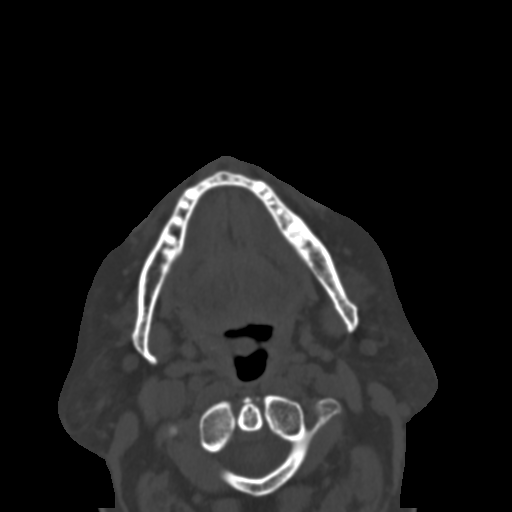
[im 34/89  bone]
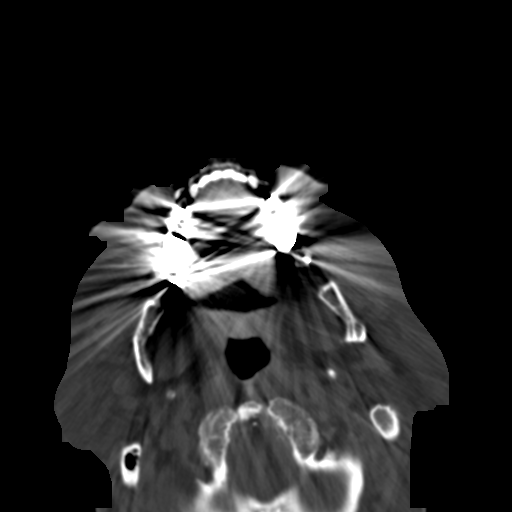
[im 46/89  brain]
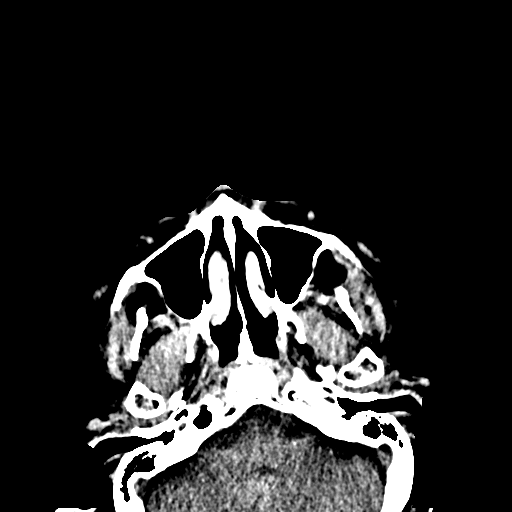
[im 46/89  bone]
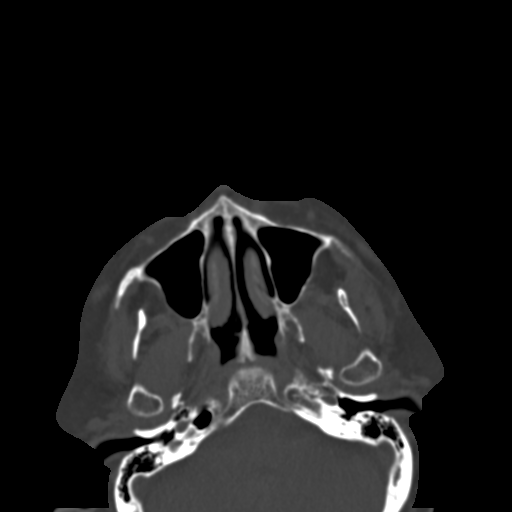
[im 55/89  bone]
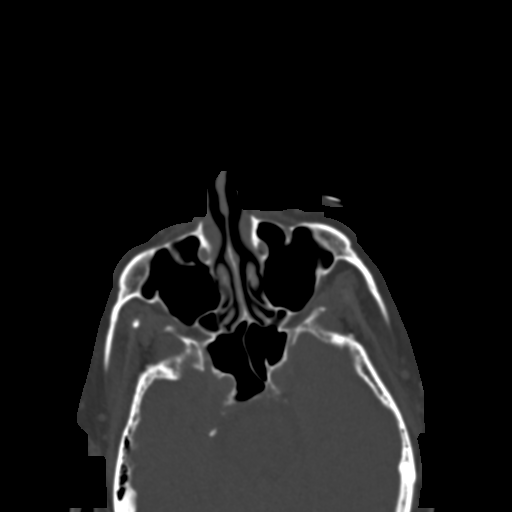
[im 64/89  bone]
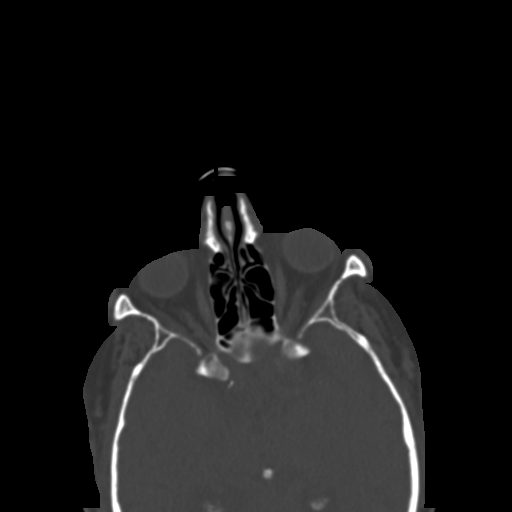
[im 73/89  bone]
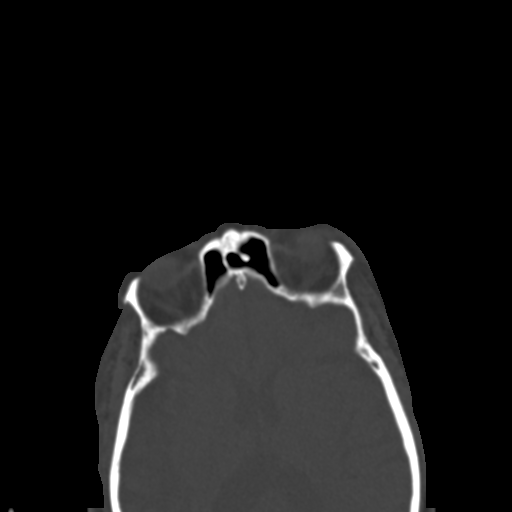
[im 82/89  brain]
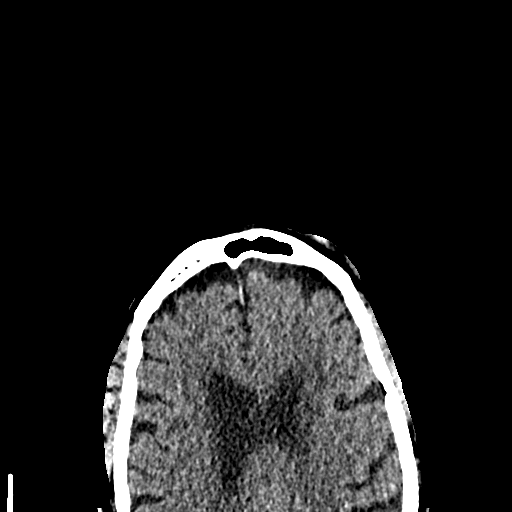
[im 82/89  bone]
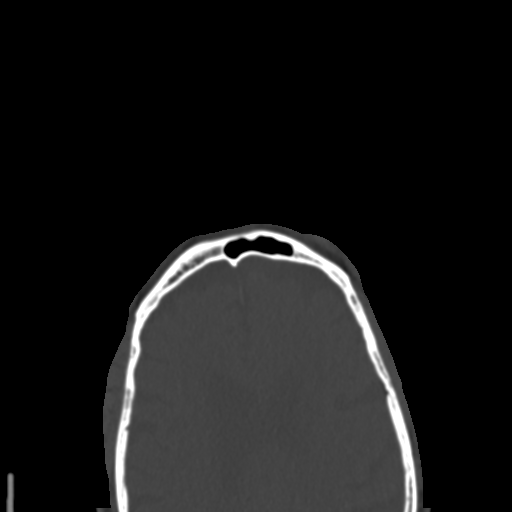

[Series 7: coronal soft · coronal · 0.41mm/px · 3 of 85 slices shown]
[im 29/85  bone]
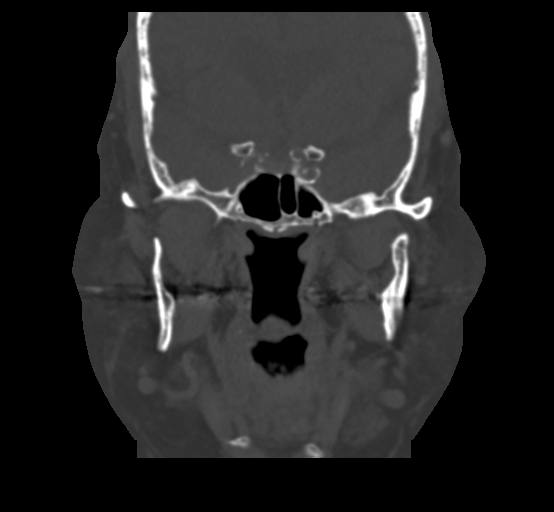
[im 38/85  bone]
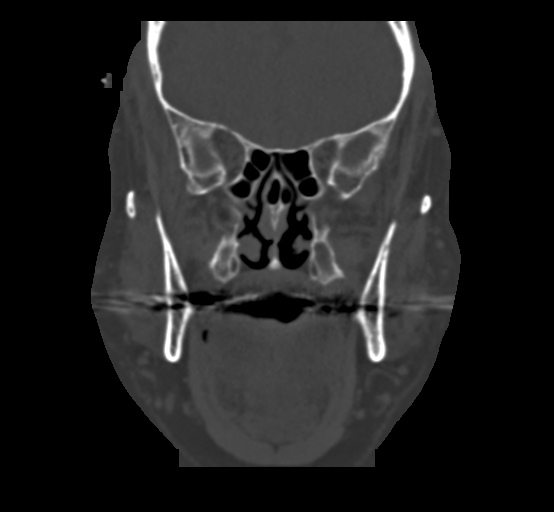
[im 47/85  bone]
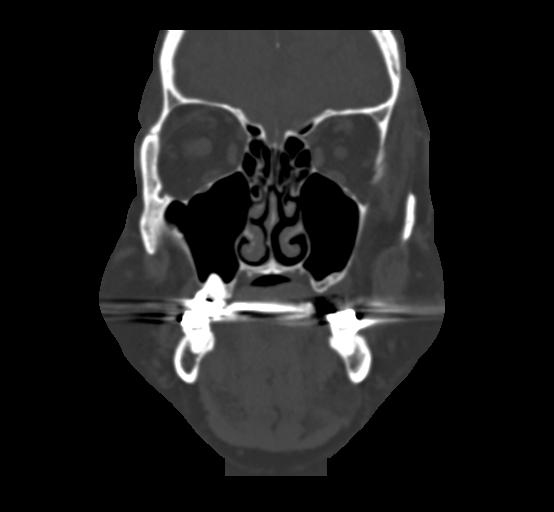

[Series 8: sagittal soft · sagittal · 0.37mm/px · 3 of 81 slices shown]
[im 27/81  bone]
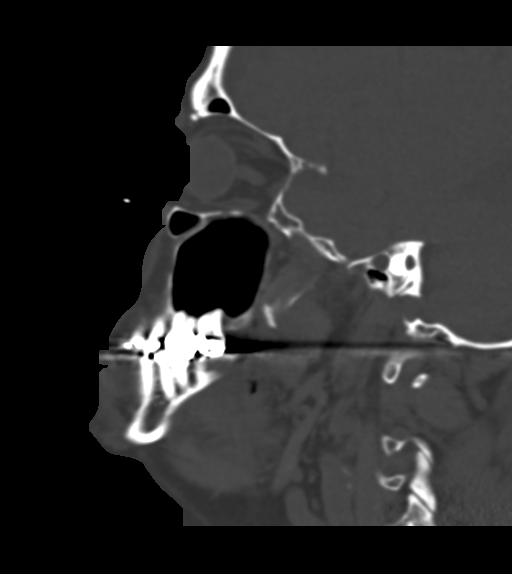
[im 41/81  bone]
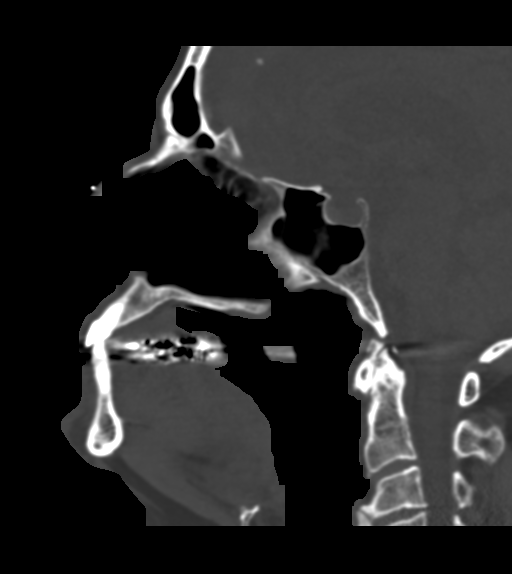
[im 54/81  bone]
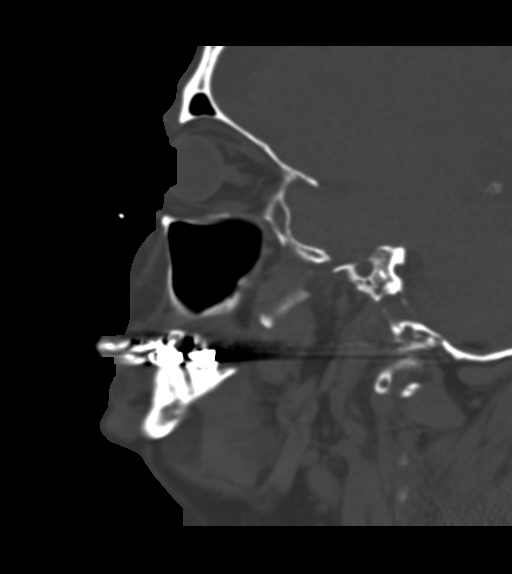

[15 of 47 positions shown; findings below may reference images not displayed]

FINDINGS: CT HEAD FINDINGS

Brain:

Cerebral ventricle sizes are concordant with the degree of cerebral
volume loss. Patchy and confluent areas of decreased attenuation are
noted throughout the deep and periventricular white matter of the
cerebral hemispheres bilaterally, compatible with chronic
microvascular ischemic disease.

No evidence of large-territorial acute infarction. No parenchymal
hemorrhage. No mass lesion. No extra-axial collection.

No mass effect or midline shift. No hydrocephalus. Basilar cisterns
are patent.

Vascular: No hyperdense vessel. Atherosclerotic calcifications are
present within the cavernous internal carotid arteries.

Skull: No acute fracture or focal lesion.

Other: Left frontal scalp/periorbital subcutaneus soft tissue
hematoma formation.

CT MAXILLOFACIAL FINDINGS

Osseous: No fracture or mandibular dislocation. No destructive
process.

Sinuses/Orbits: Paranasal sinuses and mastoid air cells are clear.
Bilateral lens replacements. Otherwise the orbits are unremarkable.

Soft tissues: Negative.

CT CERVICAL SPINE FINDINGS

Alignment: Normal.

Skull base and vertebrae: Severe degenerative changes of the spine.
No acute fracture. No aggressive appearing focal osseous lesion or
focal pathologic process.

Soft tissues and spinal canal: No prevertebral fluid or swelling. No
visible canal hematoma.

Upper chest: Unremarkable.

Other: None.
IMPRESSION: 1. No acute intracranial abnormality.
2. No acute displaced facial fracture.
3. No acute displaced fracture or traumatic listhesis of the
cervical spine.
4. Left periorbital/frontal scalp hematoma formation with no
underlying fracture.

## 2023-05-17 IMAGING — CT CT HEAD W/O CM
2 of 3 series · 14 of 47 positions shown, 17 images · non-contrast
Comparison: None.

CLINICAL DATA: Pain, status post fall

EXAM:
CT HEAD WITHOUT CONTRAST
CT MAXILLOFACIAL WITHOUT CONTRAST
CT CERVICAL SPINE WITHOUT CONTRAST
TECHNIQUE: Multidetector CT imaging of the head, cervical spine, and
maxillofacial structures were performed using the standard protocol
without intravenous contrast. Multiplanar CT image reconstructions
of the cervical spine and maxillofacial structures were also
generated.

[Series 3: head wo · axial · 0.49mm/px · z∈[+1128,+1268]mm · 11 of 34 slices shown, 14 images]
[im 3/34  brain]
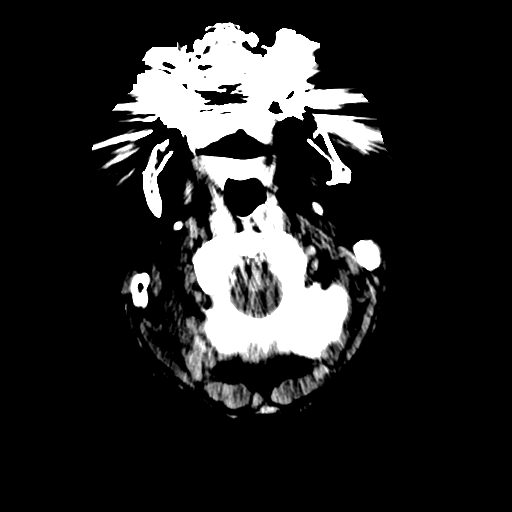
[im 3/34  bone]
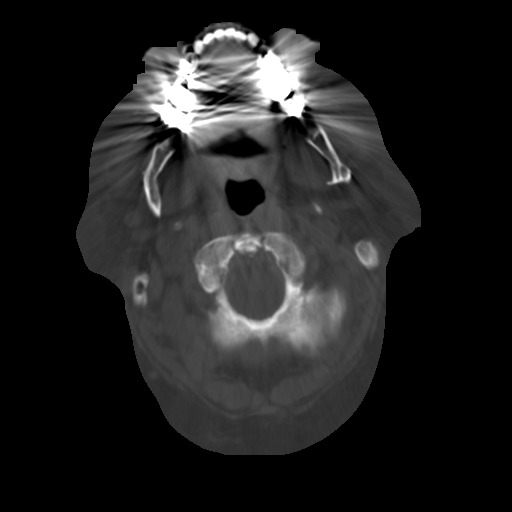
[im 5/34  brain]
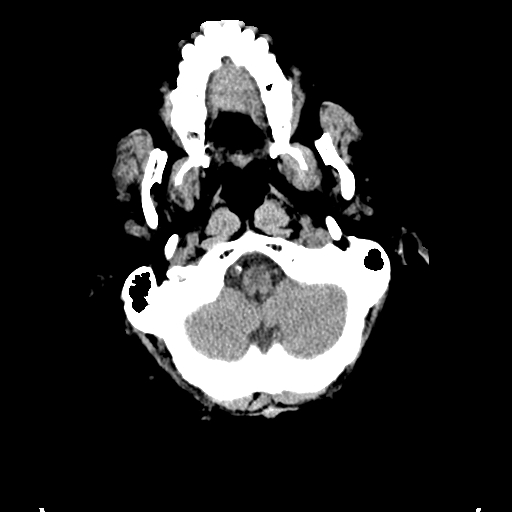
[im 8/34  brain]
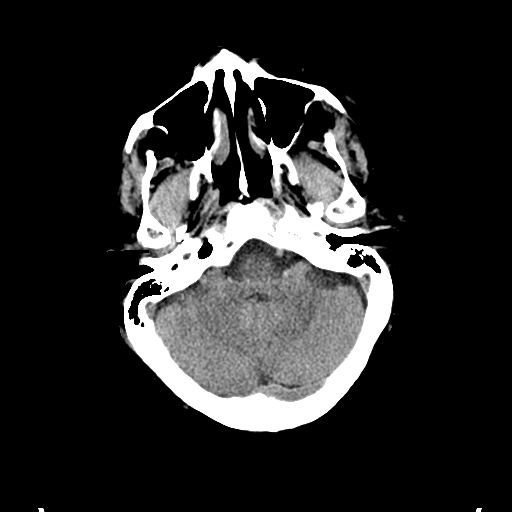
[im 11/34  brain]
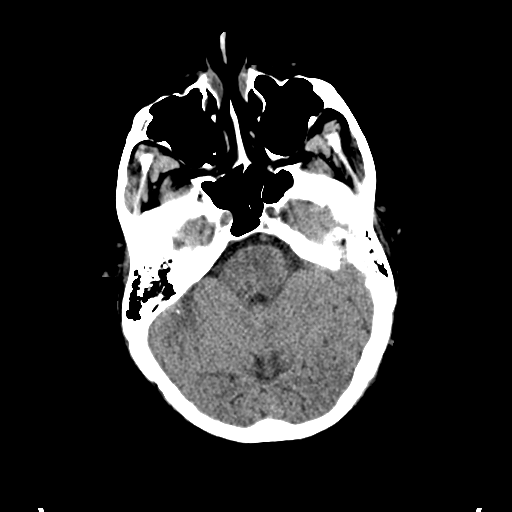
[im 14/34  brain]
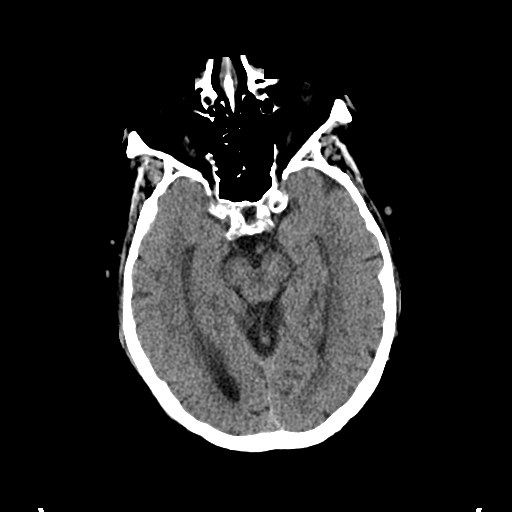
[im 14/34  bone]
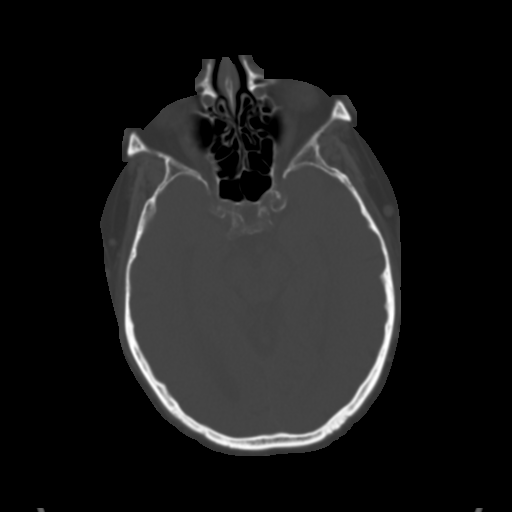
[im 18/34  brain]
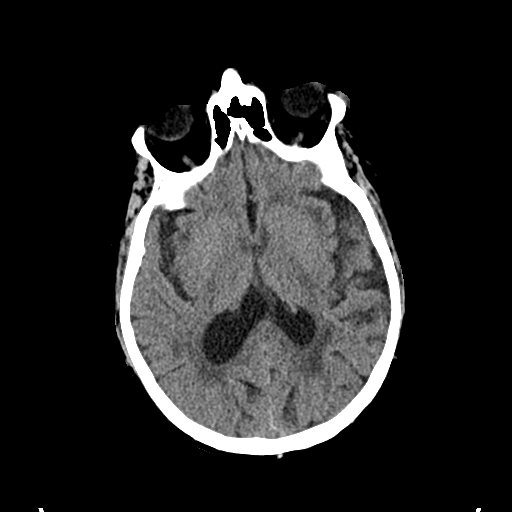
[im 20/34  brain]
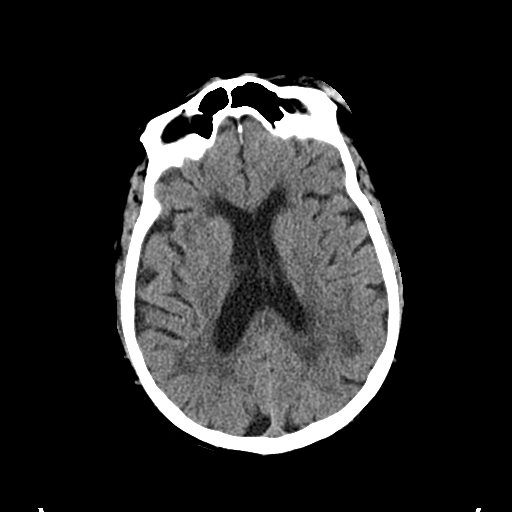
[im 23/34  brain]
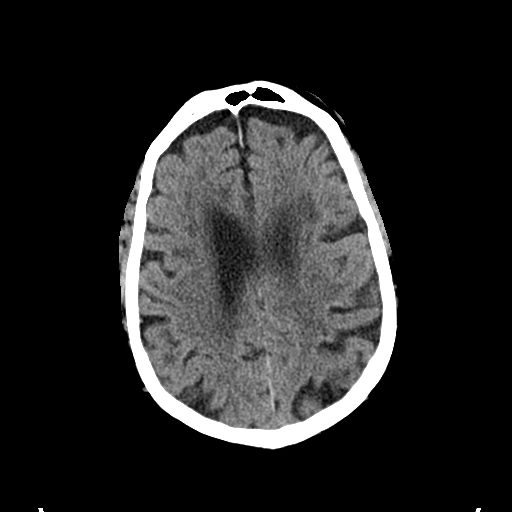
[im 26/34  brain]
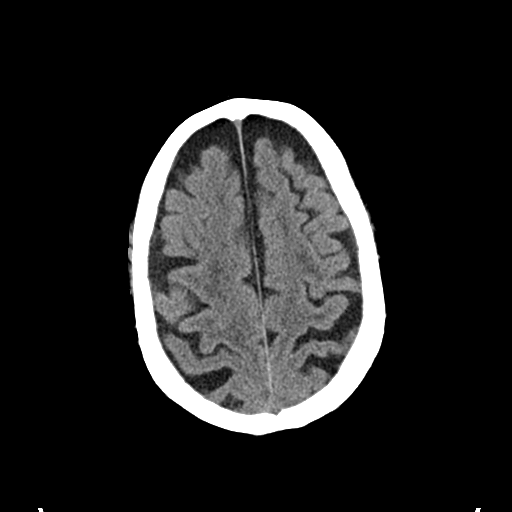
[im 26/34  bone]
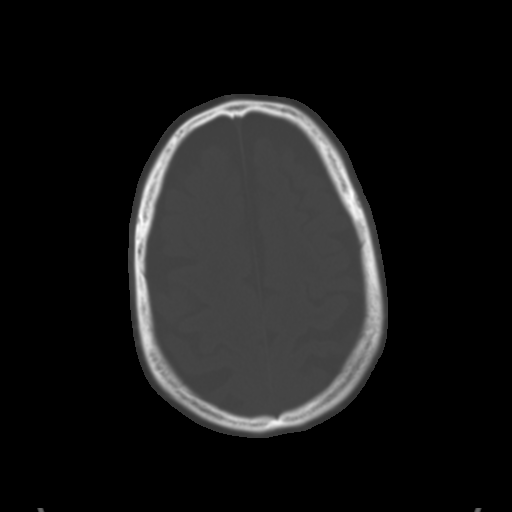
[im 29/34  brain]
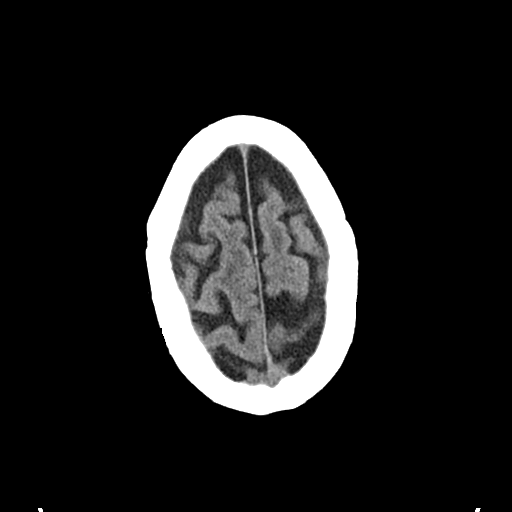
[im 31/34  brain]
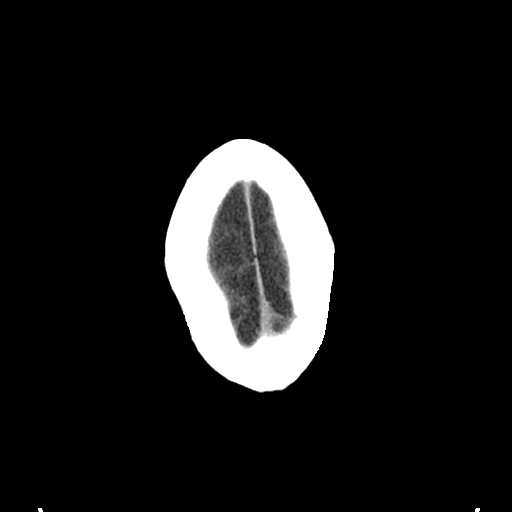

[Series 5: head coronal · coronal · 0.32mm/px · 3 of 84 slices shown]
[im 28/84  brain]
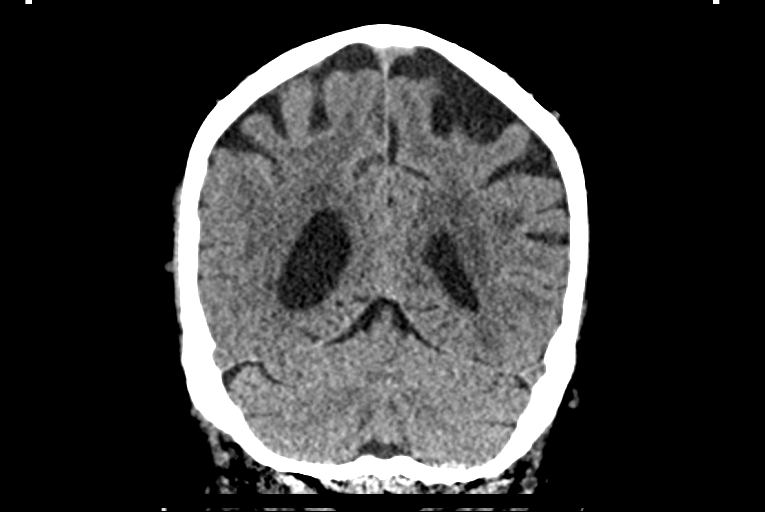
[im 37/84  brain]
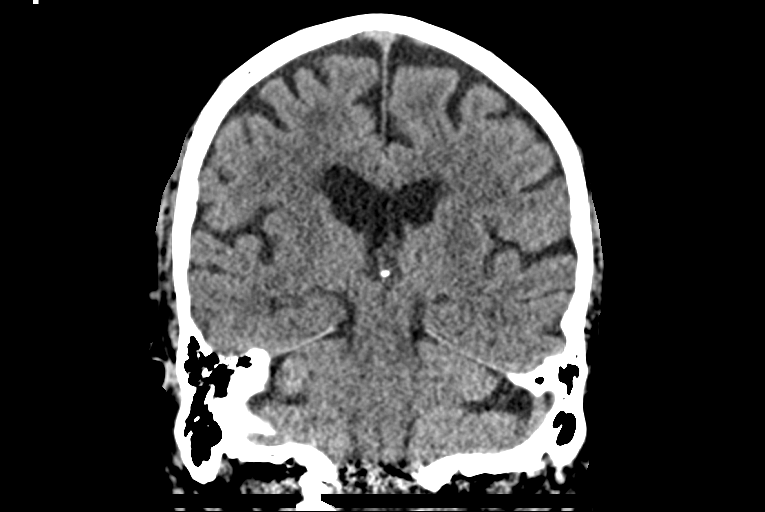
[im 47/84  brain]
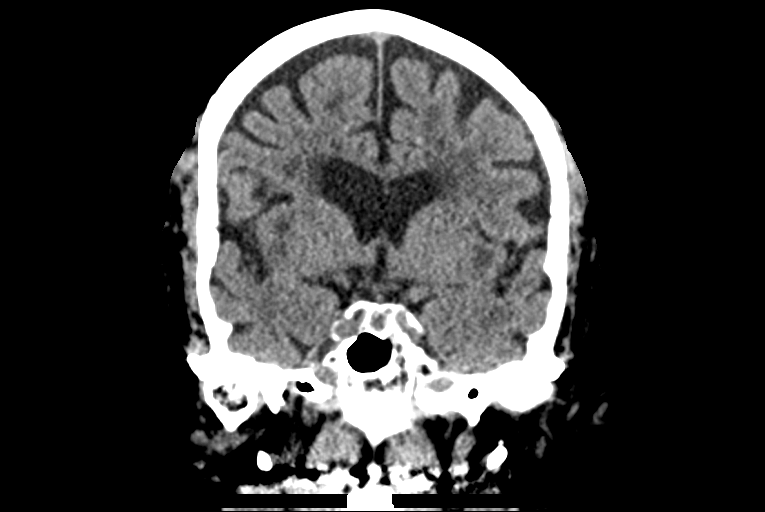

[14 of 47 positions shown; findings below may reference images not displayed]

FINDINGS: CT HEAD FINDINGS

Brain:

Cerebral ventricle sizes are concordant with the degree of cerebral
volume loss. Patchy and confluent areas of decreased attenuation are
noted throughout the deep and periventricular white matter of the
cerebral hemispheres bilaterally, compatible with chronic
microvascular ischemic disease.

No evidence of large-territorial acute infarction. No parenchymal
hemorrhage. No mass lesion. No extra-axial collection.

No mass effect or midline shift. No hydrocephalus. Basilar cisterns
are patent.

Vascular: No hyperdense vessel. Atherosclerotic calcifications are
present within the cavernous internal carotid arteries.

Skull: No acute fracture or focal lesion.

Other: Left frontal scalp/periorbital subcutaneus soft tissue
hematoma formation.

CT MAXILLOFACIAL FINDINGS

Osseous: No fracture or mandibular dislocation. No destructive
process.

Sinuses/Orbits: Paranasal sinuses and mastoid air cells are clear.
Bilateral lens replacements. Otherwise the orbits are unremarkable.

Soft tissues: Negative.

CT CERVICAL SPINE FINDINGS

Alignment: Normal.

Skull base and vertebrae: Severe degenerative changes of the spine.
No acute fracture. No aggressive appearing focal osseous lesion or
focal pathologic process.

Soft tissues and spinal canal: No prevertebral fluid or swelling. No
visible canal hematoma.

Upper chest: Unremarkable.

Other: None.
IMPRESSION: 1. No acute intracranial abnormality.
2. No acute displaced facial fracture.
3. No acute displaced fracture or traumatic listhesis of the
cervical spine.
4. Left periorbital/frontal scalp hematoma formation with no
underlying fracture.
